# Patient Record
Sex: Male | Born: 1940 | Race: White | Hispanic: No | Smoking: Former smoker
Health system: Southern US, Community
[De-identification: ages and names within clinical notes are randomized; demographics above are authoritative.]

## PROBLEM LIST (undated history)

## (undated) DIAGNOSIS — E079 Disorder of thyroid, unspecified: Secondary | ICD-10-CM

## (undated) DIAGNOSIS — G4700 Insomnia, unspecified: Secondary | ICD-10-CM

## (undated) DIAGNOSIS — Z9289 Personal history of other medical treatment: Secondary | ICD-10-CM

## (undated) DIAGNOSIS — E785 Hyperlipidemia, unspecified: Secondary | ICD-10-CM

## (undated) DIAGNOSIS — I10 Essential (primary) hypertension: Secondary | ICD-10-CM

## (undated) DIAGNOSIS — H9190 Unspecified hearing loss, unspecified ear: Secondary | ICD-10-CM

## (undated) DIAGNOSIS — E039 Hypothyroidism, unspecified: Secondary | ICD-10-CM

## (undated) DIAGNOSIS — Z9889 Other specified postprocedural states: Secondary | ICD-10-CM

## (undated) DIAGNOSIS — I3139 Other pericardial effusion (noninflammatory): Secondary | ICD-10-CM

## (undated) DIAGNOSIS — J302 Other seasonal allergic rhinitis: Secondary | ICD-10-CM

## (undated) DIAGNOSIS — K219 Gastro-esophageal reflux disease without esophagitis: Secondary | ICD-10-CM

## (undated) DIAGNOSIS — J45909 Unspecified asthma, uncomplicated: Secondary | ICD-10-CM

## (undated) DIAGNOSIS — M1990 Unspecified osteoarthritis, unspecified site: Secondary | ICD-10-CM

## (undated) DIAGNOSIS — C61 Malignant neoplasm of prostate: Secondary | ICD-10-CM

## (undated) DIAGNOSIS — J449 Chronic obstructive pulmonary disease, unspecified: Secondary | ICD-10-CM

## (undated) DIAGNOSIS — I313 Pericardial effusion (noninflammatory): Secondary | ICD-10-CM

## (undated) DIAGNOSIS — Z77098 Contact with and (suspected) exposure to other hazardous, chiefly nonmedicinal, chemicals: Secondary | ICD-10-CM

## (undated) DIAGNOSIS — C3490 Malignant neoplasm of unspecified part of unspecified bronchus or lung: Secondary | ICD-10-CM

## (undated) DIAGNOSIS — F4310 Post-traumatic stress disorder, unspecified: Secondary | ICD-10-CM

## (undated) HISTORY — DX: Malignant neoplasm of prostate: C61

## (undated) HISTORY — PX: BACK SURGERY: SHX140

## (undated) HISTORY — PX: NECK SURGERY: SHX720

## (undated) HISTORY — PX: COLONOSCOPY: SHX174

## (undated) HISTORY — PX: TONSILLECTOMY: SUR1361

## (undated) HISTORY — DX: Chronic obstructive pulmonary disease, unspecified: J44.9

## (undated) HISTORY — PX: INCISE AND DRAIN ABCESS: PRO64

## (undated) HISTORY — PX: WISDOM TOOTH EXTRACTION: SHX21

## (undated) HISTORY — PX: HERNIA REPAIR: SHX51

---

## 1999-06-10 ENCOUNTER — Encounter: Payer: Self-pay | Admitting: Emergency Medicine

## 1999-06-10 ENCOUNTER — Emergency Department (HOSPITAL_COMMUNITY): Admission: EM | Admit: 1999-06-10 | Discharge: 1999-06-10 | Payer: Self-pay | Admitting: Emergency Medicine

## 2005-06-01 ENCOUNTER — Encounter: Admission: RE | Admit: 2005-06-01 | Discharge: 2005-06-01 | Payer: Self-pay | Admitting: Neurosurgery

## 2006-03-30 ENCOUNTER — Inpatient Hospital Stay (HOSPITAL_COMMUNITY): Admission: RE | Admit: 2006-03-30 | Discharge: 2006-04-03 | Payer: Self-pay | Admitting: Neurosurgery

## 2009-04-09 ENCOUNTER — Encounter: Admission: RE | Admit: 2009-04-09 | Discharge: 2009-04-09 | Payer: Self-pay | Admitting: Neurosurgery

## 2009-05-28 ENCOUNTER — Inpatient Hospital Stay (HOSPITAL_COMMUNITY): Admission: RE | Admit: 2009-05-28 | Discharge: 2009-05-31 | Payer: Self-pay | Admitting: Neurosurgery

## 2010-07-09 LAB — COMPREHENSIVE METABOLIC PANEL
ALT: 38 U/L (ref 0–53)
AST: 28 U/L (ref 0–37)
Albumin: 4.4 g/dL (ref 3.5–5.2)
Calcium: 10 mg/dL (ref 8.4–10.5)
Creatinine, Ser: 1.04 mg/dL (ref 0.4–1.5)
GFR calc Af Amer: >60 mL/min (ref >60)
GFR calc non Af Amer: >60 mL/min (ref >60)
Total Bilirubin: 0.7 mg/dL (ref 0.3–1.2)
Total Protein: 7.1 g/dL (ref 6.0–8.3)

## 2010-07-09 LAB — URINALYSIS, ROUTINE W REFLEX MICROSCOPIC
Glucose, UA: NEGATIVE mg/dL
Nitrite: NEGATIVE
Specific Gravity, Urine: 1.045 — ABNORMAL HIGH (ref 1.005–1.030)
Urobilinogen, UA: 1 mg/dL (ref 0.0–1.0)

## 2010-07-09 LAB — CBC
HCT: 48.3 % (ref 39.0–52.0)
MCV: 98 fL (ref 78.0–100.0)
WBC: 6.3 10*3/uL (ref 4.0–10.5)

## 2010-07-09 LAB — DIFFERENTIAL
Basophils Absolute: 0.1 10*3/uL (ref 0.0–0.1)
Eosinophils Relative: 4 % (ref 0–5)
Lymphocytes Relative: 30 % (ref 12–46)
Lymphs Abs: 1.9 10*3/uL (ref 0.7–4.0)
Neutrophils Relative %: 57 % (ref 43–77)

## 2010-07-09 LAB — TYPE AND SCREEN: ABO/RH(D): A POS

## 2010-08-11 ENCOUNTER — Encounter (HOSPITAL_COMMUNITY)
Admission: RE | Admit: 2010-08-11 | Discharge: 2010-08-11 | Disposition: A | Discharge status: Home / Self Care | Payer: Medicare Other | Source: Ambulatory Visit | Attending: General Surgery | Admitting: General Surgery

## 2010-08-11 ENCOUNTER — Ambulatory Visit (HOSPITAL_COMMUNITY)
Admission: RE | Admit: 2010-08-11 | Discharge: 2010-08-11 | Disposition: A | Discharge status: Home / Self Care | Payer: Medicare Other | Source: Ambulatory Visit | Attending: General Surgery | Admitting: General Surgery

## 2010-08-11 ENCOUNTER — Other Ambulatory Visit (HOSPITAL_COMMUNITY): Payer: Self-pay | Admitting: General Surgery

## 2010-08-11 DIAGNOSIS — Z01818 Encounter for other preprocedural examination: Secondary | ICD-10-CM | POA: Insufficient documentation

## 2010-08-11 DIAGNOSIS — K429 Umbilical hernia without obstruction or gangrene: Secondary | ICD-10-CM

## 2010-08-11 DIAGNOSIS — Z01810 Encounter for preprocedural cardiovascular examination: Secondary | ICD-10-CM | POA: Insufficient documentation

## 2010-08-11 DIAGNOSIS — Z01812 Encounter for preprocedural laboratory examination: Secondary | ICD-10-CM | POA: Insufficient documentation

## 2010-08-11 DIAGNOSIS — Z01811 Encounter for preprocedural respiratory examination: Secondary | ICD-10-CM | POA: Insufficient documentation

## 2010-08-11 LAB — SURGICAL PCR SCREEN
MRSA, PCR: NEGATIVE
Staphylococcus aureus: NEGATIVE

## 2010-08-11 LAB — DIFFERENTIAL
Basophils Relative: 1 % (ref 0–1)
Lymphs Abs: 2 10*3/uL (ref 0.7–4.0)
Neutro Abs: 3.8 10*3/uL (ref 1.7–7.7)

## 2010-08-11 LAB — BASIC METABOLIC PANEL
Chloride: 105 mEq/L (ref 96–112)
Creatinine, Ser: 1.13 mg/dL (ref 0.4–1.5)
Sodium: 136 mEq/L (ref 135–145)

## 2010-08-11 LAB — CBC
HCT: 46.2 % (ref 39.0–52.0)
MCH: 32.9 pg (ref 26.0–34.0)
MCHC: 34.8 g/dL (ref 30.0–36.0)
RBC: 4.89 MIL/uL (ref 4.22–5.81)
RDW: 12.8 % (ref 11.5–15.5)
WBC: 6.6 10*3/uL (ref 4.0–10.5)

## 2010-08-12 ENCOUNTER — Ambulatory Visit (HOSPITAL_COMMUNITY)
Admission: RE | Admit: 2010-08-12 | Discharge: 2010-08-12 | Disposition: A | Discharge status: Home / Self Care | Payer: Medicare Other | Source: Ambulatory Visit | Attending: General Surgery | Admitting: General Surgery

## 2010-08-12 DIAGNOSIS — J45909 Unspecified asthma, uncomplicated: Secondary | ICD-10-CM | POA: Insufficient documentation

## 2010-08-12 DIAGNOSIS — K219 Gastro-esophageal reflux disease without esophagitis: Secondary | ICD-10-CM | POA: Insufficient documentation

## 2010-08-12 DIAGNOSIS — Z01818 Encounter for other preprocedural examination: Secondary | ICD-10-CM | POA: Insufficient documentation

## 2010-08-12 DIAGNOSIS — Z01812 Encounter for preprocedural laboratory examination: Secondary | ICD-10-CM | POA: Insufficient documentation

## 2010-08-12 DIAGNOSIS — K429 Umbilical hernia without obstruction or gangrene: Secondary | ICD-10-CM | POA: Insufficient documentation

## 2010-08-12 DIAGNOSIS — I10 Essential (primary) hypertension: Secondary | ICD-10-CM | POA: Insufficient documentation

## 2010-08-12 DIAGNOSIS — Z01810 Encounter for preprocedural cardiovascular examination: Secondary | ICD-10-CM | POA: Insufficient documentation

## 2010-08-17 NOTE — Op Note (Signed)
  [name], Carlos Barrera               ACCOUNT NO.:  [id number]  MEDICAL RECORD NO.:  [id number]           PATIENT TYPE:  O  LOCATION:  SDSC                         FACILITY:  [facility]  PHYSICIAN:  Ollen Gross. Vernell Morgans, M.D. DATE OF BIRTH:  Nov 05, 1940  DATE OF PROCEDURE:  08/12/2010 DATE OF DISCHARGE:  08/12/2010                              OPERATIVE REPORT   PREOPERATIVE DIAGNOSIS:  Umbilical hernia.  POSTOPERATIVE DIAGNOSIS:  Umbilical hernia.  PROCEDURE:  Umbilical hernia repair.  SURGEON:  Ollen Gross. Vernell Morgans, MD  ANESTHESIA:  General endotracheal.  PROCEDURE:  After informed consent was obtained, the patient was brought to the operating room and placed in supine position on the operating table.  After induction of general anesthesia, the patient's head was prepped with ChloraPrep, allowed to dry and draped in usual sterile manner.  The area above the umbilicus was infiltrated with 0.25% Marcaine.  The small vertically oriented incision was made just at the superior edge of the umbilicus with a #15 blade knife.  This incision was carried down through the skin and subcutaneous tissue sharply with electrocautery.  There was obvious small fat filled hernia there.  We dissected the hernia free from the rest of subcutaneous tissue by some blunt finger dissection and some sharp dissection with electrocautery. The hernia sac was opened.  There was only some preperitoneal fat within the sac which was reduced.  The fascial edges were clean and healthy. The fascial defect was too small to allow really a index finger through. We therefore repaired the fascial defect.  Initially we probed the anterior abdominal wall with a hemostat from the inside and did not feel any other fascial defects.  We closed the fascial defect with 3 interrupted #1 Novafil stitches.  The subcutaneous tissue was then closed with interrupted 3-0 Vicryl stitches and the skin was closed with interrupted 4-0 Monocryl  subcuticular stitches.  Dermabond dressing was applied.  We decided not to repair the hernia with mesh because the only way to insert the mesh beneath the fascia would be to open the fascial defect quite a bit more than present which I think would potentially weakened the abdominal wall there.  The patient tolerated the procedure well.  At the end of the case, all needle, sponge, and instrument counts were correct.  The patient was awakened, taken to recovery in stable condition.     Ollen Gross. Vernell Morgans, M.D.     PST/MEDQ  D:  08/12/2010  T:  08/13/2010  Job:  [id number]  Electronically Signed by Chevis Pretty III M.D. on 08/17/2010 06:41:13 AM

## 2010-09-05 NOTE — Discharge Summary (Signed)
NAMEMCLANE, ARORA               ACCOUNT NO.:  [id number]   MEDICAL RECORD NO.:  [id number]          PATIENT TYPE:  INP   LOCATION:  3037                         FACILITY:  [facility]   PHYSICIAN:  Hilda Lias, M.D.   DATE OF BIRTH:  04-21-1940   DATE OF ADMISSION:  03/30/2006  DATE OF DISCHARGE:  04/03/2006                               DISCHARGE SUMMARY   ADMISSION DIAGNOSIS:  Cervical spondylosis 4-5, 6-7.   FINAL DIAGNOSIS:  Cervical spondylosis 4-5, 6-7.   CLINICAL HISTORY:  The patient was admitted because of neck pain,  associated with discomfort going to the upper extremity.  X-ray showed  the spondylosis to 4-5, 5-6, 6-7.  Surgery was advised by Dr. Channing Mutters.   LABORATORY DATA:  Normal.   COURSE IN THE HOSPITAL:  The patient was taken to surgery and __________  was done.  The patient had difficulty swallowing the first few days, but  now he is stable.  He is ready to go home.   CONDITION ON DISCHARGE:  Improving.   MEDICATIONS:  Percocet, diazepam.   DIET:  Regular.   ACTIVITY:  Not to drive for at least a week.  Not to lift more than 20  pounds with the shoulder.   FOLLOWUP:  He will be calling Dr. Channing Mutters for an appointment.           ______________________________  Hilda Lias, M.D.     EB/MEDQ  D:  04/03/2006  T:  04/04/2006  Job:  [id number]

## 2010-09-05 NOTE — Op Note (Signed)
[name], Carlos Barrera               ACCOUNT NO.:  [id number]   MEDICAL RECORD NO.:  [id number]          PATIENT TYPE:  INP   LOCATION:  2899                         FACILITY:  [facility]   PHYSICIAN:  Payton Doughty, M.D.      DATE OF BIRTH:  22-Nov-1940   DATE OF PROCEDURE:  03/30/2006  DATE OF DISCHARGE:                               OPERATIVE REPORT   PREOPERATIVE DIAGNOSIS:  Spondylosis C4-5, C5-6, C6-C7.   POSTOPERATIVE DIAGNOSIS:  Spondylosis C4-5, C5-6, C6-C7.   PROCEDURE:  C4-5, C5-6, C6-7 anterior cervical decompression and fusion  with reflex hybrid plate.   SURGEON:  Payton Doughty, M.D.   ANESTHESIA:  General endotracheal anesthesia.   PREPARATION:  Betadine prep and scrub with alcohol wipe.   COMPLICATIONS:  None.   NURSE ASSISTANT:  [place].   DOCTOR ASSISTANCE:  Clydene Fake, M.D.   A 70 year old gentleman with severe cervical spondylosis, taken to the  operating room and smoothly anesthetized and intubated, placed supine on  the operating table in the [place] head holder with the neck slightly  extended.  Following shave, prep and drape in the usual sterile fashion,  the skin was incised starting one fingerbreadth above the clavicle  paralleling the medial aspect the sternocleidomastoid muscle for a  distance of approximately 10 cm.  The platysma was identified, elevated,  divide and undermined.  Sternocleidomastoid was identified.  Medial  dissection revealed the carotid artery retracted laterally to the left,  trachea and esophagus retracted laterally to the right.  This exposed  the bones of the anterior cervical spine.  Marker was placed.  Intraoperative x-ray obtained to confirm correctness level.  Having  confirmed correctness level, diskectomy was carried out at C4-5, C5-6  and C6-7 under gross observation.  The operating microscope was then  brought in and we used microdissection technique to remove bone spur,  divide the posterior longitudinal ligament  and explore the neural  foramina bilaterally.  At C6-7 the high-speed drill was used to remove a  large posteriorly protruding osteophyte.  Following complete  decompression at all levels, 8 mm bone grafts were fashioned with  patellar allograft and tapped into place and a three-level Reflex hybrid  plate was placed and contoured.  It was screwed in with 12 mm screws,  two in C4, two in  C5, two in C6 and two in C7.  Intraoperative x-ray  showed good placement of bone grafts, plate and screws.  Wound was  irrigated and hemostasis assured.  A 7 mL Jackson-Pratt drain was placed  in the prevertebral space and exited  via separate incision.  Successive layers of 3-0 Vicryl and 4-0 Vicryl  were used to close.  Benzoin and Steri-Strips were placed, made  occlusive with Telfa and OpSite and the patient placed in an Aspen  collar and returned to the recovery room in good condition.           ______________________________  Payton Doughty, M.D.     MWR/MEDQ  D:  03/30/2006  T:  03/30/2006  Job:  [id number]

## 2010-09-05 NOTE — H&P (Signed)
NAMEJEMAL, Carlos Barrera               ACCOUNT NO.:  [id number]   MEDICAL RECORD NO.:  [id number]          PATIENT TYPE:  INP   LOCATION:  3037                         FACILITY:  [facility]   PHYSICIAN:  Payton Doughty, M.D.      DATE OF BIRTH:  05/25/1940   DATE OF ADMISSION:  03/30/2006  DATE OF DISCHARGE:                              HISTORY & PHYSICAL   ADMITTING DIAGNOSIS:  Spondylosis at C4-5, C5-6 and C6-7.   BODY AND TEXT:  This is a self-referred 70 year old, right-handed, white  gentleman who has had neck pain for a number of years.  He is not  hyperreflexic, he has been on traction for a while, and MR demonstrates  spondylosis with foraminal narrowing at C4-5, C5-6 and C6-7, and he is  admitted now for an anterior decompression and fusion to these levels.   MEDICAL HISTORY:  Fairly benign.  He has hypertension and he takes  enalapril 20 mg a day.   ALLERGIES:  HE IS ALLERGIC TO FELDENE.   SURGICAL HISTORY:  1. Lumbar disc in 1995.  2. Tonsillectomy.  3. Right-sided neck cyst numerous years ago.   SOCIAL HISTORY:  He does not smoke, does not drink, and works as truck  and Midwife.   FAMILY HISTORY:  Noncontributory for this problem.   PHYSICAL EXAMINATION:  HEENT EXAM:  Within normal limits.  NECK:  He has reasonable range of motion in his neck with no  Lhermitte's.  CHEST:  Clear.  CARDIAC EXAM:  Regular rate and rhythm.  ABDOMEN:  Nontender.  No hepatosplenomegaly.  EXTREMITIES:  Without clubbing or cyanosis.  Peripheral pulses are good.  GU EXAM:  Deferred.  NEUROLOGICALLY:  He is awake, alert and oriented.  His cranial nerves  are intact.  Motor exam shows 5/5 strength throughout the upper  extremities, except for right deltoid and the right grip which are 4/5.  There is no Hoffman's.  Sensory disease is described in the right C6 and  C7 distribution.   CLINICAL IMPRESSION:  Cervical spondylosis with mild myelopathy.   PLAN:  Anterior decompression and fusion at  C4-5, C5-6 and C6-7.  The  risks and benefits of this regimen discussed and he wishes to proceed.           ______________________________  Payton Doughty, M.D.     MWR/MEDQ  D:  03/30/2006  T:  03/31/2006  Job:  [id number]

## 2011-07-24 DIAGNOSIS — M47812 Spondylosis without myelopathy or radiculopathy, cervical region: Secondary | ICD-10-CM | POA: Diagnosis not present

## 2011-07-24 DIAGNOSIS — M47817 Spondylosis without myelopathy or radiculopathy, lumbosacral region: Secondary | ICD-10-CM | POA: Diagnosis not present

## 2011-11-11 DIAGNOSIS — M47812 Spondylosis without myelopathy or radiculopathy, cervical region: Secondary | ICD-10-CM | POA: Diagnosis not present

## 2011-11-11 DIAGNOSIS — M47817 Spondylosis without myelopathy or radiculopathy, lumbosacral region: Secondary | ICD-10-CM | POA: Diagnosis not present

## 2012-02-19 DIAGNOSIS — Z23 Encounter for immunization: Secondary | ICD-10-CM | POA: Diagnosis not present

## 2012-03-07 DIAGNOSIS — M47812 Spondylosis without myelopathy or radiculopathy, cervical region: Secondary | ICD-10-CM | POA: Diagnosis not present

## 2012-03-07 DIAGNOSIS — M47817 Spondylosis without myelopathy or radiculopathy, lumbosacral region: Secondary | ICD-10-CM | POA: Diagnosis not present

## 2012-06-08 DIAGNOSIS — M47817 Spondylosis without myelopathy or radiculopathy, lumbosacral region: Secondary | ICD-10-CM | POA: Diagnosis not present

## 2012-06-08 DIAGNOSIS — M47812 Spondylosis without myelopathy or radiculopathy, cervical region: Secondary | ICD-10-CM | POA: Diagnosis not present

## 2012-09-07 DIAGNOSIS — M47812 Spondylosis without myelopathy or radiculopathy, cervical region: Secondary | ICD-10-CM | POA: Diagnosis not present

## 2012-09-07 DIAGNOSIS — M47817 Spondylosis without myelopathy or radiculopathy, lumbosacral region: Secondary | ICD-10-CM | POA: Diagnosis not present

## 2012-12-15 DIAGNOSIS — M47817 Spondylosis without myelopathy or radiculopathy, lumbosacral region: Secondary | ICD-10-CM | POA: Diagnosis not present

## 2012-12-15 DIAGNOSIS — M47812 Spondylosis without myelopathy or radiculopathy, cervical region: Secondary | ICD-10-CM | POA: Diagnosis not present

## 2013-03-18 DIAGNOSIS — Z23 Encounter for immunization: Secondary | ICD-10-CM | POA: Diagnosis not present

## 2013-03-28 DIAGNOSIS — M47812 Spondylosis without myelopathy or radiculopathy, cervical region: Secondary | ICD-10-CM | POA: Diagnosis not present

## 2013-03-28 DIAGNOSIS — M47817 Spondylosis without myelopathy or radiculopathy, lumbosacral region: Secondary | ICD-10-CM | POA: Diagnosis not present

## 2013-06-10 DIAGNOSIS — H1045 Other chronic allergic conjunctivitis: Secondary | ICD-10-CM | POA: Diagnosis not present

## 2013-06-10 DIAGNOSIS — H40039 Anatomical narrow angle, unspecified eye: Secondary | ICD-10-CM | POA: Diagnosis not present

## 2013-09-20 DIAGNOSIS — M47812 Spondylosis without myelopathy or radiculopathy, cervical region: Secondary | ICD-10-CM | POA: Diagnosis not present

## 2013-09-20 DIAGNOSIS — M47817 Spondylosis without myelopathy or radiculopathy, lumbosacral region: Secondary | ICD-10-CM | POA: Diagnosis not present

## 2014-03-21 DIAGNOSIS — M47816 Spondylosis without myelopathy or radiculopathy, lumbar region: Secondary | ICD-10-CM | POA: Diagnosis not present

## 2014-03-21 DIAGNOSIS — M4302 Spondylolysis, cervical region: Secondary | ICD-10-CM | POA: Diagnosis not present

## 2014-04-10 DIAGNOSIS — M779 Enthesopathy, unspecified: Secondary | ICD-10-CM | POA: Diagnosis not present

## 2014-04-10 DIAGNOSIS — M7021 Olecranon bursitis, right elbow: Secondary | ICD-10-CM | POA: Diagnosis not present

## 2014-04-10 DIAGNOSIS — M19021 Primary osteoarthritis, right elbow: Secondary | ICD-10-CM | POA: Diagnosis not present

## 2014-04-10 DIAGNOSIS — M25521 Pain in right elbow: Secondary | ICD-10-CM | POA: Diagnosis not present

## 2014-05-07 ENCOUNTER — Emergency Department (HOSPITAL_COMMUNITY): Payer: Medicare Other

## 2014-05-07 ENCOUNTER — Emergency Department (HOSPITAL_COMMUNITY)
Admission: EM | Admit: 2014-05-07 | Discharge: 2014-05-07 | Disposition: A | Discharge status: Home / Self Care | Payer: Medicare Other | Attending: Emergency Medicine | Admitting: Emergency Medicine

## 2014-05-07 ENCOUNTER — Encounter (HOSPITAL_COMMUNITY): Payer: Self-pay

## 2014-05-07 DIAGNOSIS — S4991XA Unspecified injury of right shoulder and upper arm, initial encounter: Secondary | ICD-10-CM | POA: Insufficient documentation

## 2014-05-07 DIAGNOSIS — Y9389 Activity, other specified: Secondary | ICD-10-CM | POA: Diagnosis not present

## 2014-05-07 DIAGNOSIS — S0990XA Unspecified injury of head, initial encounter: Secondary | ICD-10-CM | POA: Insufficient documentation

## 2014-05-07 DIAGNOSIS — I10 Essential (primary) hypertension: Secondary | ICD-10-CM | POA: Insufficient documentation

## 2014-05-07 DIAGNOSIS — S3992XA Unspecified injury of lower back, initial encounter: Secondary | ICD-10-CM | POA: Insufficient documentation

## 2014-05-07 DIAGNOSIS — Y998 Other external cause status: Secondary | ICD-10-CM | POA: Insufficient documentation

## 2014-05-07 DIAGNOSIS — S59901A Unspecified injury of right elbow, initial encounter: Secondary | ICD-10-CM | POA: Diagnosis not present

## 2014-05-07 DIAGNOSIS — Z87891 Personal history of nicotine dependence: Secondary | ICD-10-CM | POA: Insufficient documentation

## 2014-05-07 DIAGNOSIS — Z8639 Personal history of other endocrine, nutritional and metabolic disease: Secondary | ICD-10-CM | POA: Insufficient documentation

## 2014-05-07 DIAGNOSIS — S12600A Unspecified displaced fracture of seventh cervical vertebra, initial encounter for closed fracture: Secondary | ICD-10-CM | POA: Insufficient documentation

## 2014-05-07 DIAGNOSIS — S129XXA Fracture of neck, unspecified, initial encounter: Secondary | ICD-10-CM | POA: Diagnosis not present

## 2014-05-07 DIAGNOSIS — M542 Cervicalgia: Secondary | ICD-10-CM | POA: Diagnosis not present

## 2014-05-07 DIAGNOSIS — Y92410 Unspecified street and highway as the place of occurrence of the external cause: Secondary | ICD-10-CM | POA: Diagnosis not present

## 2014-05-07 DIAGNOSIS — S12690A Other displaced fracture of seventh cervical vertebra, initial encounter for closed fracture: Secondary | ICD-10-CM | POA: Diagnosis not present

## 2014-05-07 DIAGNOSIS — R51 Headache: Secondary | ICD-10-CM | POA: Diagnosis not present

## 2014-05-07 DIAGNOSIS — R55 Syncope and collapse: Secondary | ICD-10-CM | POA: Diagnosis not present

## 2014-05-07 DIAGNOSIS — S59801A Other specified injuries of right elbow, initial encounter: Secondary | ICD-10-CM | POA: Diagnosis not present

## 2014-05-07 DIAGNOSIS — M25511 Pain in right shoulder: Secondary | ICD-10-CM | POA: Diagnosis not present

## 2014-05-07 DIAGNOSIS — S299XXA Unspecified injury of thorax, initial encounter: Secondary | ICD-10-CM | POA: Diagnosis not present

## 2014-05-07 DIAGNOSIS — M25521 Pain in right elbow: Secondary | ICD-10-CM | POA: Diagnosis not present

## 2014-05-07 DIAGNOSIS — S199XXA Unspecified injury of neck, initial encounter: Secondary | ICD-10-CM | POA: Diagnosis present

## 2014-05-07 DIAGNOSIS — T148 Other injury of unspecified body region: Secondary | ICD-10-CM | POA: Diagnosis not present

## 2014-05-07 HISTORY — DX: Disorder of thyroid, unspecified: E07.9

## 2014-05-07 HISTORY — DX: Essential (primary) hypertension: I10

## 2014-05-07 MED ORDER — ONDANSETRON HCL 4 MG/2ML IJ SOLN
4.0000 mg | Freq: Once | INTRAMUSCULAR | Status: AC
  Administered 2014-05-07: 4 mg via INTRAVENOUS
  Filled 2014-05-07: qty 2

## 2014-05-07 MED ORDER — MORPHINE SULFATE 4 MG/ML IJ SOLN
4.0000 mg | Freq: Once | INTRAMUSCULAR | Status: AC
  Administered 2014-05-07: 4 mg via INTRAVENOUS
  Filled 2014-05-07: qty 1
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
## 2014-05-07 NOTE — ED Notes (Signed)
Patient transported to CT 
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
## 2014-05-15 DIAGNOSIS — M25521 Pain in right elbow: Secondary | ICD-10-CM | POA: Diagnosis not present

## 2014-05-15 DIAGNOSIS — S5001XA Contusion of right elbow, initial encounter: Secondary | ICD-10-CM | POA: Diagnosis not present

## 2014-05-20 ENCOUNTER — Emergency Department (HOSPITAL_COMMUNITY)
Admission: EM | Admit: 2014-05-20 | Discharge: 2014-05-20 | Disposition: A | Discharge status: Home / Self Care | Payer: Medicare Other | Attending: Emergency Medicine | Admitting: Emergency Medicine

## 2014-05-20 ENCOUNTER — Emergency Department (HOSPITAL_COMMUNITY): Payer: Medicare Other

## 2014-05-20 ENCOUNTER — Encounter (HOSPITAL_COMMUNITY): Payer: Self-pay | Admitting: Emergency Medicine

## 2014-05-20 DIAGNOSIS — Z87891 Personal history of nicotine dependence: Secondary | ICD-10-CM | POA: Insufficient documentation

## 2014-05-20 DIAGNOSIS — E079 Disorder of thyroid, unspecified: Secondary | ICD-10-CM | POA: Diagnosis not present

## 2014-05-20 DIAGNOSIS — S128XXD Fracture of other parts of neck, subsequent encounter: Secondary | ICD-10-CM | POA: Diagnosis not present

## 2014-05-20 DIAGNOSIS — Z7951 Long term (current) use of inhaled steroids: Secondary | ICD-10-CM | POA: Insufficient documentation

## 2014-05-20 DIAGNOSIS — M542 Cervicalgia: Secondary | ICD-10-CM | POA: Diagnosis present

## 2014-05-20 DIAGNOSIS — Z7982 Long term (current) use of aspirin: Secondary | ICD-10-CM | POA: Insufficient documentation

## 2014-05-20 DIAGNOSIS — S129XXD Fracture of neck, unspecified, subsequent encounter: Secondary | ICD-10-CM | POA: Diagnosis not present

## 2014-05-20 DIAGNOSIS — Z79899 Other long term (current) drug therapy: Secondary | ICD-10-CM | POA: Insufficient documentation

## 2014-05-20 DIAGNOSIS — S12691D Other nondisplaced fracture of seventh cervical vertebra, subsequent encounter for fracture with routine healing: Secondary | ICD-10-CM | POA: Insufficient documentation

## 2014-05-20 DIAGNOSIS — I10 Essential (primary) hypertension: Secondary | ICD-10-CM | POA: Diagnosis not present

## 2014-05-20 MED ORDER — OXYCODONE-ACETAMINOPHEN 5-325 MG PO TABS: 1.0000 | ORAL_TABLET | Freq: Four times a day (QID) | ORAL | Status: DC | PRN

## 2014-05-20 MED ORDER — MORPHINE SULFATE 4 MG/ML IJ SOLN
6.0000 mg | Freq: Once | INTRAMUSCULAR | Status: AC
  Administered 2014-05-20: 6 mg via INTRAVENOUS
  Filled 2014-05-20: qty 2

## 2014-05-20 NOTE — ED Notes (Signed)
Pt returned from scanner.

## 2014-05-20 NOTE — Discharge Instructions (Signed)
Return here as needed. Follow up with your neurosurgeon as scheduled. The CT scan today was unchanged from prior

## 2014-05-20 NOTE — ED Notes (Signed)
Mouth swab given. MD at beside updating pt on POC.

## 2014-05-20 NOTE — ED Notes (Signed)
PT ambulated with baseline gait; VSS; A&Ox3; no signs of distress; respirations even and unlabored; skin warm and dry; no questions upon discharge.  

## 2014-05-20 NOTE — ED Notes (Signed)
Pt states he woke up today at 2 am with a sharp pain on his right side of his neck, pt is wearing a c-collar at this time, he tock some Hydrocodone 5 mg and Flexeril 10 mg with no relief.

## 2014-05-20 NOTE — ED Notes (Signed)
Patient transported to CT 
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
## 2014-05-24 DIAGNOSIS — Z981 Arthrodesis status: Secondary | ICD-10-CM | POA: Diagnosis not present

## 2014-05-24 DIAGNOSIS — M4322 Fusion of spine, cervical region: Secondary | ICD-10-CM | POA: Diagnosis not present

## 2014-05-24 DIAGNOSIS — M4302 Spondylolysis, cervical region: Secondary | ICD-10-CM | POA: Diagnosis not present

## 2014-05-24 DIAGNOSIS — M47816 Spondylosis without myelopathy or radiculopathy, lumbar region: Secondary | ICD-10-CM | POA: Diagnosis not present

## 2014-05-24 DIAGNOSIS — Z09 Encounter for follow-up examination after completed treatment for conditions other than malignant neoplasm: Secondary | ICD-10-CM | POA: Diagnosis not present

## 2014-05-24 DIAGNOSIS — M4852XA Collapsed vertebra, not elsewhere classified, cervical region, initial encounter for fracture: Secondary | ICD-10-CM | POA: Diagnosis not present

## 2014-06-08 ENCOUNTER — Encounter (HOSPITAL_COMMUNITY): Payer: Self-pay | Admitting: *Deleted

## 2014-06-08 ENCOUNTER — Emergency Department (HOSPITAL_COMMUNITY): Payer: No Typology Code available for payment source

## 2014-06-08 ENCOUNTER — Emergency Department (HOSPITAL_COMMUNITY)
Admission: EM | Admit: 2014-06-08 | Discharge: 2014-06-08 | Disposition: A | Discharge status: Home / Self Care | Payer: No Typology Code available for payment source | Attending: Emergency Medicine | Admitting: Emergency Medicine

## 2014-06-08 DIAGNOSIS — I10 Essential (primary) hypertension: Secondary | ICD-10-CM | POA: Diagnosis not present

## 2014-06-08 DIAGNOSIS — Z7982 Long term (current) use of aspirin: Secondary | ICD-10-CM | POA: Diagnosis not present

## 2014-06-08 DIAGNOSIS — Z87891 Personal history of nicotine dependence: Secondary | ICD-10-CM | POA: Diagnosis not present

## 2014-06-08 DIAGNOSIS — Z79899 Other long term (current) drug therapy: Secondary | ICD-10-CM | POA: Diagnosis not present

## 2014-06-08 DIAGNOSIS — M542 Cervicalgia: Secondary | ICD-10-CM | POA: Insufficient documentation

## 2014-06-08 DIAGNOSIS — R202 Paresthesia of skin: Secondary | ICD-10-CM | POA: Diagnosis not present

## 2014-06-08 DIAGNOSIS — E079 Disorder of thyroid, unspecified: Secondary | ICD-10-CM | POA: Diagnosis not present

## 2014-06-08 DIAGNOSIS — I6789 Other cerebrovascular disease: Secondary | ICD-10-CM | POA: Diagnosis not present

## 2014-06-08 DIAGNOSIS — S12690A Other displaced fracture of seventh cervical vertebra, initial encounter for closed fracture: Secondary | ICD-10-CM | POA: Diagnosis not present

## 2014-06-08 MED ORDER — IBUPROFEN 800 MG PO TABS: 800.0000 mg | ORAL_TABLET | Freq: Three times a day (TID) | ORAL | Status: DC | PRN

## 2014-06-08 MED ORDER — DIAZEPAM 2 MG PO TABS
2.0000 mg | ORAL_TABLET | Freq: Once | ORAL | Status: AC
  Administered 2014-06-08: 2 mg via ORAL
  Filled 2014-06-08: qty 1

## 2014-06-08 MED ORDER — KETOROLAC TROMETHAMINE 60 MG/2ML IM SOLN
60.0000 mg | Freq: Once | INTRAMUSCULAR | Status: AC
  Administered 2014-06-08: 60 mg via INTRAMUSCULAR
  Filled 2014-06-08: qty 2
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
## 2014-06-14 DIAGNOSIS — Z981 Arthrodesis status: Secondary | ICD-10-CM | POA: Diagnosis not present

## 2014-06-14 DIAGNOSIS — S12690D Other displaced fracture of seventh cervical vertebra, subsequent encounter for fracture with routine healing: Secondary | ICD-10-CM | POA: Diagnosis not present

## 2014-06-14 DIAGNOSIS — M47816 Spondylosis without myelopathy or radiculopathy, lumbar region: Secondary | ICD-10-CM | POA: Diagnosis not present

## 2014-06-14 DIAGNOSIS — M47892 Other spondylosis, cervical region: Secondary | ICD-10-CM | POA: Diagnosis not present

## 2014-06-14 DIAGNOSIS — M4302 Spondylolysis, cervical region: Secondary | ICD-10-CM | POA: Diagnosis not present

## 2014-06-20 DIAGNOSIS — S5001XD Contusion of right elbow, subsequent encounter: Secondary | ICD-10-CM | POA: Diagnosis not present

## 2014-06-20 DIAGNOSIS — M75102 Unspecified rotator cuff tear or rupture of left shoulder, not specified as traumatic: Secondary | ICD-10-CM | POA: Diagnosis not present

## 2014-06-20 DIAGNOSIS — M7542 Impingement syndrome of left shoulder: Secondary | ICD-10-CM | POA: Diagnosis not present

## 2014-06-20 DIAGNOSIS — M25521 Pain in right elbow: Secondary | ICD-10-CM | POA: Diagnosis not present

## 2014-06-26 DIAGNOSIS — M7542 Impingement syndrome of left shoulder: Secondary | ICD-10-CM | POA: Diagnosis not present

## 2014-06-29 DIAGNOSIS — M7542 Impingement syndrome of left shoulder: Secondary | ICD-10-CM | POA: Diagnosis not present

## 2014-07-03 DIAGNOSIS — M7542 Impingement syndrome of left shoulder: Secondary | ICD-10-CM | POA: Diagnosis not present

## 2014-07-06 DIAGNOSIS — M7542 Impingement syndrome of left shoulder: Secondary | ICD-10-CM | POA: Diagnosis not present

## 2014-07-10 DIAGNOSIS — M7542 Impingement syndrome of left shoulder: Secondary | ICD-10-CM | POA: Diagnosis not present

## 2014-07-12 DIAGNOSIS — M7542 Impingement syndrome of left shoulder: Secondary | ICD-10-CM | POA: Diagnosis not present

## 2014-07-17 DIAGNOSIS — M7542 Impingement syndrome of left shoulder: Secondary | ICD-10-CM | POA: Diagnosis not present

## 2014-07-18 DIAGNOSIS — M4302 Spondylolysis, cervical region: Secondary | ICD-10-CM | POA: Diagnosis not present

## 2014-07-18 DIAGNOSIS — M47816 Spondylosis without myelopathy or radiculopathy, lumbar region: Secondary | ICD-10-CM | POA: Diagnosis not present

## 2014-07-19 DIAGNOSIS — M7542 Impingement syndrome of left shoulder: Secondary | ICD-10-CM | POA: Diagnosis not present

## 2014-07-24 DIAGNOSIS — M7021 Olecranon bursitis, right elbow: Secondary | ICD-10-CM | POA: Diagnosis not present

## 2014-07-24 DIAGNOSIS — M25512 Pain in left shoulder: Secondary | ICD-10-CM | POA: Diagnosis not present

## 2014-07-24 DIAGNOSIS — M75102 Unspecified rotator cuff tear or rupture of left shoulder, not specified as traumatic: Secondary | ICD-10-CM | POA: Diagnosis not present

## 2014-07-24 DIAGNOSIS — S5001XD Contusion of right elbow, subsequent encounter: Secondary | ICD-10-CM | POA: Diagnosis not present
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
## 2016-09-15 DIAGNOSIS — M25562 Pain in left knee: Secondary | ICD-10-CM | POA: Diagnosis not present

## 2016-09-15 DIAGNOSIS — M1712 Unilateral primary osteoarthritis, left knee: Secondary | ICD-10-CM | POA: Diagnosis not present

## 2016-09-16 DIAGNOSIS — M1711 Unilateral primary osteoarthritis, right knee: Secondary | ICD-10-CM | POA: Diagnosis not present

## 2016-09-16 DIAGNOSIS — M25561 Pain in right knee: Secondary | ICD-10-CM | POA: Diagnosis not present

## 2016-09-22 DIAGNOSIS — M25562 Pain in left knee: Secondary | ICD-10-CM | POA: Diagnosis not present

## 2016-09-22 DIAGNOSIS — M1712 Unilateral primary osteoarthritis, left knee: Secondary | ICD-10-CM | POA: Diagnosis not present

## 2016-09-23 DIAGNOSIS — M1711 Unilateral primary osteoarthritis, right knee: Secondary | ICD-10-CM | POA: Diagnosis not present

## 2016-09-23 DIAGNOSIS — M25561 Pain in right knee: Secondary | ICD-10-CM | POA: Diagnosis not present

## 2016-09-29 DIAGNOSIS — M1712 Unilateral primary osteoarthritis, left knee: Secondary | ICD-10-CM | POA: Diagnosis not present

## 2016-09-29 DIAGNOSIS — M25562 Pain in left knee: Secondary | ICD-10-CM | POA: Diagnosis not present

## 2016-09-30 DIAGNOSIS — M1711 Unilateral primary osteoarthritis, right knee: Secondary | ICD-10-CM | POA: Diagnosis not present

## 2016-09-30 DIAGNOSIS — M25561 Pain in right knee: Secondary | ICD-10-CM | POA: Diagnosis not present

## 2016-10-06 DIAGNOSIS — M25562 Pain in left knee: Secondary | ICD-10-CM | POA: Diagnosis not present

## 2016-10-06 DIAGNOSIS — M1712 Unilateral primary osteoarthritis, left knee: Secondary | ICD-10-CM | POA: Diagnosis not present

## 2016-10-07 DIAGNOSIS — M1711 Unilateral primary osteoarthritis, right knee: Secondary | ICD-10-CM | POA: Diagnosis not present

## 2016-10-07 DIAGNOSIS — M25561 Pain in right knee: Secondary | ICD-10-CM | POA: Diagnosis not present

## 2016-10-13 DIAGNOSIS — M25562 Pain in left knee: Secondary | ICD-10-CM | POA: Diagnosis not present

## 2016-10-13 DIAGNOSIS — M1712 Unilateral primary osteoarthritis, left knee: Secondary | ICD-10-CM | POA: Diagnosis not present

## 2016-10-22 ENCOUNTER — Emergency Department (HOSPITAL_COMMUNITY)
Admission: EM | Admit: 2016-10-22 | Discharge: 2016-10-22 | Disposition: A | Discharge status: Home / Self Care | Payer: Medicare Other | Attending: Emergency Medicine | Admitting: Emergency Medicine

## 2016-10-22 ENCOUNTER — Emergency Department (HOSPITAL_COMMUNITY): Payer: Medicare Other

## 2016-10-22 ENCOUNTER — Encounter (HOSPITAL_COMMUNITY): Payer: Self-pay

## 2016-10-22 DIAGNOSIS — I10 Essential (primary) hypertension: Secondary | ICD-10-CM | POA: Insufficient documentation

## 2016-10-22 DIAGNOSIS — X18XXXA Contact with other hot metals, initial encounter: Secondary | ICD-10-CM | POA: Diagnosis not present

## 2016-10-22 DIAGNOSIS — Y929 Unspecified place or not applicable: Secondary | ICD-10-CM | POA: Diagnosis not present

## 2016-10-22 DIAGNOSIS — T300 Burn of unspecified body region, unspecified degree: Secondary | ICD-10-CM

## 2016-10-22 DIAGNOSIS — Z87891 Personal history of nicotine dependence: Secondary | ICD-10-CM | POA: Diagnosis not present

## 2016-10-22 DIAGNOSIS — S0990XA Unspecified injury of head, initial encounter: Secondary | ICD-10-CM | POA: Diagnosis not present

## 2016-10-22 DIAGNOSIS — T310 Burns involving less than 10% of body surface: Secondary | ICD-10-CM | POA: Insufficient documentation

## 2016-10-22 DIAGNOSIS — Z7982 Long term (current) use of aspirin: Secondary | ICD-10-CM | POA: Diagnosis not present

## 2016-10-22 DIAGNOSIS — Y939 Activity, unspecified: Secondary | ICD-10-CM | POA: Diagnosis not present

## 2016-10-22 DIAGNOSIS — Z79899 Other long term (current) drug therapy: Secondary | ICD-10-CM | POA: Insufficient documentation

## 2016-10-22 DIAGNOSIS — S0993XA Unspecified injury of face, initial encounter: Secondary | ICD-10-CM | POA: Diagnosis not present

## 2016-10-22 DIAGNOSIS — T24231A Burn of second degree of right lower leg, initial encounter: Secondary | ICD-10-CM | POA: Insufficient documentation

## 2016-10-22 DIAGNOSIS — S098XXA Other specified injuries of head, initial encounter: Secondary | ICD-10-CM | POA: Insufficient documentation

## 2016-10-22 DIAGNOSIS — R51 Headache: Secondary | ICD-10-CM | POA: Diagnosis not present

## 2016-10-22 DIAGNOSIS — Y999 Unspecified external cause status: Secondary | ICD-10-CM | POA: Insufficient documentation

## 2016-10-22 MED ORDER — NAPROXEN 375 MG PO TABS
375.0000 mg | ORAL_TABLET | Freq: Once | ORAL | Status: AC
  Administered 2016-10-22: 375 mg via ORAL
  Filled 2016-10-22: qty 1

## 2016-10-22 MED ORDER — SILVER SULFADIAZINE 1 % EX CREA
TOPICAL_CREAM | Freq: Once | CUTANEOUS | Status: AC
  Administered 2016-10-22: 17:00:00 via TOPICAL
  Filled 2016-10-22: qty 50
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
## 2016-11-27 ENCOUNTER — Emergency Department
Admission: EM | Admit: 2016-11-27 | Discharge: 2016-11-27 | Disposition: A | Discharge status: Home / Self Care | Payer: Medicare Other | Attending: Emergency Medicine | Admitting: Emergency Medicine

## 2016-11-27 ENCOUNTER — Encounter: Payer: Self-pay | Admitting: Emergency Medicine

## 2016-11-27 DIAGNOSIS — Z7982 Long term (current) use of aspirin: Secondary | ICD-10-CM | POA: Diagnosis not present

## 2016-11-27 DIAGNOSIS — T7840XA Allergy, unspecified, initial encounter: Secondary | ICD-10-CM | POA: Diagnosis present

## 2016-11-27 DIAGNOSIS — T63441A Toxic effect of venom of bees, accidental (unintentional), initial encounter: Secondary | ICD-10-CM | POA: Insufficient documentation

## 2016-11-27 DIAGNOSIS — Z79899 Other long term (current) drug therapy: Secondary | ICD-10-CM | POA: Insufficient documentation

## 2016-11-27 DIAGNOSIS — I10 Essential (primary) hypertension: Secondary | ICD-10-CM | POA: Diagnosis not present

## 2016-11-27 DIAGNOSIS — Z87891 Personal history of nicotine dependence: Secondary | ICD-10-CM | POA: Diagnosis not present

## 2016-11-27 MED ORDER — METHYLPREDNISOLONE SODIUM SUCC 125 MG IJ SOLR
125.0000 mg | Freq: Once | INTRAMUSCULAR | Status: AC
  Administered 2016-11-27: 125 mg via INTRAMUSCULAR
  Filled 2016-11-27: qty 2

## 2016-11-27 MED ORDER — DIPHENHYDRAMINE HCL 50 MG/ML IJ SOLN
50.0000 mg | Freq: Once | INTRAMUSCULAR | Status: AC
  Administered 2016-11-27: 50 mg via INTRAMUSCULAR
  Filled 2016-11-27: qty 1

## 2016-11-27 MED ORDER — FAMOTIDINE 20 MG PO TABS
20.0000 mg | ORAL_TABLET | Freq: Once | ORAL | Status: AC
  Administered 2016-11-27: 20 mg via ORAL
  Filled 2016-11-27: qty 1

## 2016-11-27 MED ORDER — PREDNISONE 50 MG PO TABS: 50.0000 mg | ORAL_TABLET | Freq: Every day | ORAL | 0 refills | Status: DC

## 2016-11-27 NOTE — ED Notes (Signed)
Pt. Going home with friend 

## 2016-11-27 NOTE — ED Notes (Addendum)
Pt. States he was stung by a yellow insect to upper back.  Pt. States taking benadryl before coming to ED.  Swelling/welt noted to back.

## 2016-11-27 NOTE — ED Provider Notes (Signed)
[facility] Emergency Department Provider Note  ____________________________________________  Time seen: Approximately 6:07 PM  I have reviewed the triage vital signs and the nursing notes.   HISTORY  Chief Complaint Allergic Reaction    HPI Carlos Barrera is a 76 y.o. male who presents emergency department status post being stung by apparent yellowjacket. Patient reports that he was stung by a 2 of these, one to the posterior right shoulder, the other to the left lower leg. Patient reports that he is highly allergic to anything that "stings." Patient does have an EpiPen but states that symptoms were not better enough for him to use autoinjector. Patient reports that he feels mildly short of breath but denies any wheezing or frank difficulty breathing. He reports pain to both of the sites along with pruritus. He denies any other complaints at this time. Patient did take Benadryl prior to arrival.   Past Medical History:  Diagnosis Date  . Hypertension   . Thyroid disease     There are no active problems to display for this patient.   Past Surgical History:  Procedure Laterality Date  . BACK SURGERY      Prior to Admission medications   Medication Sig Start Date End Date Taking? Authorizing Provider  aspirin EC 81 MG tablet Take 81 mg by mouth daily.    [provider]  budesonide-formoterol (SYMBICORT) 80-4.5 MCG/ACT inhaler Inhale 2 puffs into the lungs 2 (two) times daily.    [provider]  cetirizine (ZYRTEC) 10 MG tablet Take 10 mg by mouth daily.    [provider]  ibuprofen (ADVIL,MOTRIN) 800 MG tablet Take 1 tablet (800 mg total) by mouth every 8 (eight) hours as needed for moderate pain. 06/08/14   Everlene Balls, MD  levothyroxine (SYNTHROID, LEVOTHROID) 137 MCG tablet Take 137 mcg by mouth daily before breakfast.    [provider]  lisinopril (PRINIVIL,ZESTRIL) 10 MG tablet Take 10 mg by mouth daily.     [provider]  pravastatin (PRAVACHOL) 20 MG tablet Take 20 mg by mouth at bedtime.    [provider]  prazosin (MINIPRESS) 2 MG capsule Take 4 mg by mouth at bedtime.    [provider]  predniSONE (DELTASONE) 50 MG tablet Take 1 tablet (50 mg total) by mouth daily with breakfast. 11/27/16   Yang Rack, Charline Bills, PA-C  sertraline (ZOLOFT) 100 MG tablet Take 100 mg by mouth at bedtime.    [provider]  tiotropium (SPIRIVA) 18 MCG inhalation capsule Place 18 mcg into inhaler and inhale every evening.    [provider]    Allergies Feldene [piroxicam]  No family history on file.  Social History Social History  Substance Use Topics  . Smoking status: Former Research scientist (life sciences)  . Smokeless tobacco: Never Used  . Alcohol use No     Review of Systems  Constitutional: No fever/chills Eyes: No visual changes. ENT: No upper respiratory complaints. Cardiovascular: no chest pain. Respiratory: no cough. No SOB. Gastrointestinal: No abdominal pain.  No nausea, no vomiting.  Musculoskeletal: Negative for musculoskeletal pain. Skin: Negative for rash, abrasions, lacerations, ecchymosis. Positive for objective stings to the right posterior shoulder and left lower leg. Neurological: Negative for headaches, focal weakness or numbness. 10-point ROS otherwise negative.  ____________________________________________   PHYSICAL EXAM:  VITAL SIGNS: ED Triage Vitals  Enc Vitals Group     BP 11/27/16 1754 139/67     Pulse --      Resp 11/27/16 1754  18     Temp 11/27/16 1754 98.3 F (36.8 C)     Temp Source 11/27/16 1754 Oral     SpO2 --      Weight 11/27/16 1755 175 lb (79.4 kg)     Height 11/27/16 1755 5\' 8"  (1.727 m)     Head Circumference --      Peak Flow --      Pain Score --      Pain Loc --      Pain Edu? --      Excl. in [place]? --      Constitutional: Alert and oriented. Well appearing and in no acute distress. Eyes: Conjunctivae are  normal. PERRL. EOMI. Head: Atraumatic. ENT:      Ears:       Nose: No congestion/rhinnorhea.      Mouth/Throat: Mucous membranes are moist. No angioedema. Uvula was midline. Neck: No stridor.   Cardiovascular: Normal rate, regular rhythm. Normal S1 and S2.  Good peripheral circulation. Respiratory: Normal respiratory effort without tachypnea or retractions. Lungs CTAB. Good air entry to the bases with no decreased or absent breath sounds. Musculoskeletal: Full range of motion to all extremities. No gross deformities appreciated. Neurologic:  Normal speech and language. No gross focal neurologic deficits are appreciated.  Skin:  Skin is warm, dry and intact. No rash noted. Erythematous wheal noted to the right posterior shoulder. Measures approximately 3 cm in diameter. It is tender to palpation. No embedded stinger is visualized. Erythematous wheal noted to the left lateral leg. Measures approximately 2 cm in diameter. Raised. Area is tender to palpation. No embedded stinger. Psychiatric: Mood and affect are normal. Speech and behavior are normal. Patient exhibits appropriate insight and judgement.   ____________________________________________   LABS (all labs ordered are listed, but only abnormal results are displayed)  Labs Reviewed - No data to display ____________________________________________  EKG   ____________________________________________  RADIOLOGY   No results found.  ____________________________________________    PROCEDURES  Procedure(s) performed:    Procedures    Medications  methylPREDNISolone sodium succinate (SOLU-MEDROL) 125 mg/2 mL injection 125 mg (125 mg Intramuscular Given 11/27/16 1832)  diphenhydrAMINE (BENADRYL) injection 50 mg (50 mg Intramuscular Given 11/27/16 1828)  famotidine (PEPCID) tablet 20 mg (20 mg Oral Given 11/27/16 1826)     ____________________________________________   INITIAL IMPRESSION / ASSESSMENT AND PLAN / ED  COURSE  Pertinent labs & imaging results that were available during my care of the patient were reviewed by me and considered in my medical decision making (see chart for details).  Review of the [place] CSRS was performed in accordance of the [place] prior to dispensing any controlled drugs.  Clinical Course as of Nov 28 1914  Fri Nov 27, 2016  1810 Patient presents emergency department taking stung twice by all objective. Patient is highly allergic and is prescribed EpiPen. Patient reports pain to the site of the sting, mild shortness of breath feeling. Patient did not use his EpiPen prior to arrival. He did use Benadryl prior to arrival. Patient denies any frank difficulty breathing. He denies any wheezing or stridor. Current plan is to administer Solu-Medrol, diphenhydramine, famotidine and emergency department. Patient will be monitored and evaluated for a period of time. If no progression of symptoms, and exam remained stable, patient will be discharged with prescription for steroids and continued use of Benadryl.  [JC]    Clinical Course User Index [JC] Izel Hochberg, Charline Bills, PA-C    Patient's diagnosis is consistent with Bee  sting reaction. 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 He is stung twice by a yellowjacket. On presentation, patient felt mildly short of breath and had large wheals at sting site. After receiving an injection of steroid, diphenhydramine, famotidine, patient reports improvement in shortness of breath feeling. Lesions surrounding sting are improving initially. At this time, patient is safe for discharge. he does have a prescribed EpiPen at home.. Patient will be discharged home with prescriptions for 5 days of steroids. Patient is to follow up with primary care as needed or otherwise directed. Patient is given ED precautions to return to the ED for any worsening or new symptoms.     ____________________________________________  FINAL CLINICAL  IMPRESSION(S) / ED DIAGNOSES  Final diagnoses:  Bee sting reaction, accidental or unintentional, initial encounter      NEW MEDICATIONS STARTED DURING THIS VISIT:  New Prescriptions   PREDNISONE (DELTASONE) 50 MG TABLET    Take 1 tablet (50 mg total) by mouth daily with breakfast.        This chart was dictated using voice recognition software/Dragon. Despite best efforts to proofread, errors can occur which can change the meaning. Any change was purely unintentional.     Darletta Moll, PA-C 11/27/16 1916    Orbie Pyo, MD 11/27/16 2352

## 2016-11-27 NOTE — ED Triage Notes (Addendum)
Bee sting about one hour PTA.  Small amount of hives seen to back.  Patient states he feels SOB.  Benadryl taken PTA, did not use Epipen.  STates he is allergic to "all things that sting"  Lungs CTA. Voice clear and strong.  Skin warm and dry. NAD

## 2017-02-03 ENCOUNTER — Encounter (HOSPITAL_COMMUNITY): Payer: Self-pay | Admitting: *Deleted

## 2017-02-03 ENCOUNTER — Emergency Department (HOSPITAL_COMMUNITY)
Admission: EM | Admit: 2017-02-03 | Discharge: 2017-02-03 | Disposition: A | Discharge status: Home / Self Care | Payer: Medicare Other | Attending: Physician Assistant | Admitting: Physician Assistant

## 2017-02-03 DIAGNOSIS — T7840XA Allergy, unspecified, initial encounter: Secondary | ICD-10-CM | POA: Diagnosis not present

## 2017-02-03 DIAGNOSIS — Z79899 Other long term (current) drug therapy: Secondary | ICD-10-CM | POA: Insufficient documentation

## 2017-02-03 DIAGNOSIS — Z87891 Personal history of nicotine dependence: Secondary | ICD-10-CM | POA: Insufficient documentation

## 2017-02-03 DIAGNOSIS — I10 Essential (primary) hypertension: Secondary | ICD-10-CM | POA: Insufficient documentation

## 2017-02-03 DIAGNOSIS — T63441A Toxic effect of venom of bees, accidental (unintentional), initial encounter: Secondary | ICD-10-CM | POA: Insufficient documentation

## 2017-02-03 DIAGNOSIS — R229 Localized swelling, mass and lump, unspecified: Secondary | ICD-10-CM | POA: Diagnosis not present

## 2017-02-03 DIAGNOSIS — Z7982 Long term (current) use of aspirin: Secondary | ICD-10-CM | POA: Diagnosis not present

## 2017-02-03 MED ORDER — METHYLPREDNISOLONE SODIUM SUCC 125 MG IJ SOLR
125.0000 mg | Freq: Once | INTRAMUSCULAR | Status: AC
  Administered 2017-02-03: 125 mg via INTRAVENOUS
  Filled 2017-02-03: qty 2

## 2017-02-03 MED ORDER — EPINEPHRINE 0.3 MG/0.3ML IJ SOAJ: 0.3000 mg | Freq: Once | INTRAMUSCULAR | 0 refills | Status: AC

## 2017-02-03 MED ORDER — PREDNISONE 10 MG PO TABS: 40.0000 mg | ORAL_TABLET | Freq: Every day | ORAL | 0 refills | Status: AC

## 2017-02-03 NOTE — Discharge Instructions (Signed)
Please return with any return of symptoms.

## 2017-02-03 NOTE — ED Provider Notes (Signed)
[facility] EMERGENCY DEPARTMENT Provider Note   CSN: [id number] Arrival date & time: 02/03/17  1326     History   Chief Complaint Chief Complaint  Patient presents with  . Allergic Reaction    HPI Carlos Barrera is a 76 y.o. male.  HPI    Patient is 76 year old male with a known allergy to bee stings. He reports he was cutting a tree when a bunch of bees bit him. He has multiple bites on his body. He took an EpiPen prior to arrival. He is also given 50 mg of Benadryl by the fire department. Pt reports he had some SOB at first, now resolved.   Past Medical History:  Diagnosis Date  . Hypertension   . Thyroid disease     There are no active problems to display for this patient.   Past Surgical History:  Procedure Laterality Date  . BACK SURGERY         Home Medications    Prior to Admission medications   Medication Sig Start Date End Date Taking? Authorizing Provider  aspirin EC 81 MG tablet Take 81 mg by mouth daily.   Yes [provider]  budesonide-formoterol (SYMBICORT) 80-4.5 MCG/ACT inhaler Inhale 2 puffs into the lungs 2 (two) times daily.   Yes [provider]  cetirizine (ZYRTEC) 10 MG tablet Take 10 mg by mouth daily.   Yes [provider]  ibuprofen (ADVIL,MOTRIN) 800 MG tablet Take 1 tablet (800 mg total) by mouth every 8 (eight) hours as needed for moderate pain. 06/08/14  Yes Everlene Balls, MD  levothyroxine (SYNTHROID, LEVOTHROID) 137 MCG tablet Take 137 mcg by mouth daily before breakfast.   Yes [provider]  lisinopril (PRINIVIL,ZESTRIL) 10 MG tablet Take 10 mg by mouth daily.   Yes [provider]  pravastatin (PRAVACHOL) 20 MG tablet Take 20 mg by mouth at bedtime.   Yes [provider]  prazosin (MINIPRESS) 2 MG capsule Take 4 mg by mouth at bedtime.   Yes [provider]  sertraline (ZOLOFT) 100 MG tablet Take 100 mg by mouth at bedtime.   Yes [provider]  tiotropium (SPIRIVA) 18 MCG inhalation capsule Place 18 mcg into inhaler and inhale every evening.   Yes [provider]  predniSONE (DELTASONE) 50 MG tablet Take 1 tablet (50 mg total) by mouth daily with breakfast. Patient not taking: Reported on 02/03/2017 11/27/16   Cuthriell, Charline Bills, PA-C    Family History No family history on file.  Social History Social History  Substance Use Topics  . Smoking status: Former Research scientist (life sciences)  . Smokeless tobacco: Never Used  . Alcohol use No     Allergies   Feldene [piroxicam]   Review of Systems Review of Systems  Constitutional: Negative for activity change.  Respiratory: Positive for shortness of breath.   Cardiovascular: Negative for chest pain.  Gastrointestinal: Negative for abdominal pain.  Skin: Positive for color change.     Physical Exam Updated Vital Signs BP (!) 160/72   Pulse (!) 52   Temp 97.8 F (36.6 C) (Oral)   Resp 17   Ht 5\' 8"  (1.727 m)   Wt 81.6 kg (180 lb)   SpO2 95%   BMI 27.37 kg/m   Physical Exam  Constitutional: He is oriented to person, place, and time. He appears well-nourished.  HENT:  Head: Normocephalic.  Eyes: Conjunctivae are normal.  Cardiovascular: Normal rate.   Pulmonary/Chest: Effort normal and breath sounds normal.  Abdominal: Soft. He exhibits no distension. There is no tenderness.  Neurological: He is oriented to person, place, and time.  Skin: Skin is warm and dry. He is not diaphoretic.  Multiple small bee stings  Psychiatric: He has a normal mood and affect. His behavior is normal.     ED Treatments / Results  Labs (all labs ordered are listed, but only abnormal results are displayed) Labs Reviewed - No data to display  EKG  EKG Interpretation None       Radiology No results found.  Procedures Procedures (including critical care time)  Medications Ordered in ED Medications  methylPREDNISolone sodium succinate (SOLU-MEDROL) 125 mg/2 mL  injection 125 mg (not administered)     Initial Impression / Assessment and Plan / ED Course  I have reviewed the triage vital signs and the nursing notes.  Pertinent labs & imaging results that were available during my care of the patient were reviewed by me and considered in my medical decision making (see chart for details).     Patient is 76 year old male with a known allergy to bee stings. He reports he was cutting a tree when a bunch of bees bit him. He has multiple bites on his body. He took an EpiPen prior to arrival. He is also given 50 mg of Benadryl by the fire department. Pt reports he had some SOB at first, now resolved.   We'll give steroids. Observed for 4 hours.  Patient reports he has another EpiPen at home.    Final Clinical Impressions(s) / ED Diagnoses   Final diagnoses:  None    New Prescriptions New Prescriptions   No medications on file     Macarthur Critchley, MD 02/03/17 1440

## 2017-02-03 NOTE — ED Triage Notes (Signed)
Patient states he was cutting a stump and was stung 6-7  By yellow jackets, states he is allergic to anything that stings, patient gave himself epip, and drove himself to the [facility]. And was given Benadryl 50 mg. Up arrival to ed patient is alert oriented. States it feels like he is having problems swallowing at times however he is able to speak in complete sentences without hesitation.

## 2017-02-18 DIAGNOSIS — Z23 Encounter for immunization: Secondary | ICD-10-CM | POA: Diagnosis not present

## 2017-12-27 ENCOUNTER — Ambulatory Visit
Admission: RE | Admit: 2017-12-27 | Discharge: 2017-12-27 | Disposition: A | Discharge status: Home / Self Care | Payer: No Typology Code available for payment source | Source: Ambulatory Visit | Attending: Radiation Oncology | Admitting: Radiation Oncology

## 2017-12-27 ENCOUNTER — Other Ambulatory Visit: Payer: Self-pay

## 2017-12-27 ENCOUNTER — Encounter: Payer: Self-pay | Admitting: Radiation Oncology

## 2017-12-27 VITALS — BP 134/78 | HR 43 | Temp 96.7°F | Resp 18 | Wt 170.2 lb

## 2017-12-27 DIAGNOSIS — Z79899 Other long term (current) drug therapy: Secondary | ICD-10-CM | POA: Diagnosis not present

## 2017-12-27 DIAGNOSIS — E079 Disorder of thyroid, unspecified: Secondary | ICD-10-CM | POA: Diagnosis not present

## 2017-12-27 DIAGNOSIS — Z7982 Long term (current) use of aspirin: Secondary | ICD-10-CM | POA: Insufficient documentation

## 2017-12-27 DIAGNOSIS — I10 Essential (primary) hypertension: Secondary | ICD-10-CM | POA: Diagnosis not present

## 2017-12-27 DIAGNOSIS — C61 Malignant neoplasm of prostate: Secondary | ICD-10-CM | POA: Insufficient documentation

## 2017-12-27 NOTE — Consult Note (Signed)
NEW PATIENT EVALUATION  Name: Carlos Barrera  MRN: [id number]  Date:   12/27/2017     DOB: [birth year]   This 77 y.o. 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  REFERRING PHYSICIAN: No ref. provider found  CHIEF COMPLAINT:  Chief Complaint  Patient presents with  . Prostate Cancer    Pt is here for initial consulation of prostate cancer    DIAGNOSIS: The encounter diagnosis was Malignant neoplasm of prostate ([facility]).   PREVIOUS INVESTIGATIONS:  Pathology reports reviewed CT scan abdomen and pelvis as well as bone scan reports reviewed Clinical notes reviewed  HPI: patient is a 77 year old male from the [facility] who is had for several years and elevated PSA starting with a PSA of 3.4 back in October 2017. He was noted in January 2019 tablet PSA of 7.6 with an abnormal digital rectal exam showing right-sided nodularity. He had approximate 42 mm prostate. He underwent transrectal ultrasound-guided biopsy showing right-sided core biopsies 9 of 9 showing Gleason 9 (4+5). Perineural invasion was present. 9 biopsies of the left prostate showed benign tissue. Patient had MRI scan performed in August 2019 showing 1.4 cm focus within the right peripheral zone concerning for high-grade high-volume disease. No definitive gross extracapsular extension was identified no pelvic adenopathy was noted. The exam was performed without contrast based on patient's wishes. Bone scan was also unremarkable for metastatic disease. He is discussed treatments with urology is now referred to radiation oncology for consideration of treatment. He is doing fairly well he does have some dribbling nocturia 1specific frequency. Bowels are doing fine he has no bone pain.  PLANNED TREATMENT REGIMEN: I MRT radiation therapy with ADT therapy  PAST MEDICAL HISTORY:  has a past medical history of  Hypertension and Thyroid disease.    PAST SURGICAL HISTORY:  Past Surgical History:  Procedure Laterality Date  . BACK SURGERY      FAMILY HISTORY: family history is not on file.  SOCIAL HISTORY:  reports that he has quit smoking. He has never used smokeless tobacco. He reports that he does not drink alcohol or use drugs.  ALLERGIES: Feldene [piroxicam]  MEDICATIONS:  Current Outpatient Medications  Medication Sig Dispense Refill  . aspirin EC 81 MG tablet Take 81 mg by mouth daily.    . budesonide-formoterol (SYMBICORT) 80-4.5 MCG/ACT inhaler Inhale 2 puffs into the lungs 2 (two) times daily.    . cetirizine (ZYRTEC) 10 MG tablet Take 10 mg by mouth daily.    Marland Kitchen ibuprofen (ADVIL,MOTRIN) 800 MG tablet Take 1 tablet (800 mg total) by mouth every 8 (eight) hours as needed for moderate pain. 15 tablet 0  . levothyroxine (SYNTHROID, LEVOTHROID) 137 MCG tablet Take 137 mcg by mouth daily before breakfast.    . lisinopril (PRINIVIL,ZESTRIL) 10 MG tablet Take 10 mg by mouth daily.    . Meloxicam 15 MG TBDP Take by mouth daily as needed.    . pravastatin (PRAVACHOL) 20 MG tablet Take 20 mg by mouth at bedtime.    . prazosin (MINIPRESS) 2 MG capsule Take 4 mg by mouth at bedtime.    . ranitidine (ZANTAC) 150 MG tablet Take 150 mg by mouth at bedtime.    . sertraline (ZOLOFT) 100 MG tablet Take 100 mg by mouth at bedtime.    Marland Kitchen tiotropium (SPIRIVA) 18 MCG inhalation capsule Place 18 mcg into inhaler  and inhale every evening.     No current facility-administered medications for this encounter.     ECOG PERFORMANCE STATUS:  0 - Asymptomatic  REVIEW OF SYSTEMS:  Patient denies any weight loss, fatigue, weakness, fever, chills or night sweats. Patient denies any loss of vision, blurred vision. Patient denies any ringing  of the ears or hearing loss. No irregular heartbeat. Patient denies heart murmur or history of fainting. Patient denies any chest pain or pain radiating to her upper extremities.  Patient denies any shortness of breath, difficulty breathing at night, cough or hemoptysis. Patient denies any swelling in the lower legs. Patient denies any nausea vomiting, vomiting of blood, or coffee ground material in the vomitus. Patient denies any stomach pain. Patient states has had normal bowel movements no significant constipation or diarrhea. Patient denies any dysuria, hematuria or significant nocturia. Patient denies any problems walking, swelling in the joints or loss of balance. Patient denies any skin changes, loss of hair or loss of weight. Patient denies any excessive worrying or anxiety or significant depression. Patient denies any problems with insomnia. Patient denies excessive thirst, polyuria, polydipsia. Patient denies any swollen glands, patient denies easy bruising or easy bleeding. Patient denies any recent infections, allergies or URI. Patient "s visual fields have not changed significantly in recent time.    PHYSICAL EXAM: BP 134/78 (BP Location: Left Arm, Patient Position: Sitting)   Pulse (!) 43   Temp (!) 96.7 F (35.9 C) (Tympanic)   Resp 18   Wt 170 lb 3.1 oz (77.2 kg)   BMI 25.88 kg/m  On rectal exam rectal sphincter tone is good prostate is firm the right lateral lobe although the sulcus is preserved bilaterally. Remainder of rectal exam is unremarkable.Well-developed well-nourished patient in NAD. HEENT reveals PERLA, EOMI, discs not visualized.  Oral cavity is clear. No oral mucosal lesions are identified. Neck is clear without evidence of cervical or supraclavicular adenopathy. Lungs are clear to A&P. Cardiac examination is essentially unremarkable with regular rate and rhythm without murmur rub or thrill. Abdomen is benign with no organomegaly or masses noted. Motor sensory and DTR levels are equal and symmetric in the upper and lower extremities. Cranial nerves II through XII are grossly intact. Proprioception is intact. No peripheral adenopathy or edema is  identified. No motor or sensory levels are noted. Crude visual fields are within normal range.  LABORATORY DATA: pathology reports reviewed    RADIOLOGY RESULTS:bone scan and CT scan reports reviewed   IMPRESSION: stage IIB Gleason 9 (4+5) adenocarcinoma the prostate in 77 year old male  PLAN: at this time I have run the [facility] on the patient. This shows only a 33% chance at 5 years of progression free survival. He only has a 10% chance of organ confined disease and 89% chance of extracapsular extension with lymph node and seminal vesicle involvement around 35%. Based on these factors I believe he would be a good candidate for I am RT radiation therapy. I would treat both his pelvic lymph nodes as well as his prostate. We treated his prostate 8000 cGy over 8 weeks and his pelvic lymph nodes to 5400 cGy over 8 weeks using I MRT dose painting technique. Risks and benefits of treatment including fatigue alteration of blood counts increased lower urinary tract symptoms diarrhea and skin reaction all were discussed in detail with the patient. I would also like to start the patient on at least a year and a half to 2 years of androgen deprivation therapy  and have requested that. Patient seems to comprehend my treatment plan well. I first set up and ordered CT simulation for later this week.  I would like to take this opportunity to thank you for allowing me to participate in the care of your patient.Noreene Filbert, MD

## 2017-12-29 ENCOUNTER — Other Ambulatory Visit: Payer: Self-pay | Admitting: *Deleted

## 2017-12-29 DIAGNOSIS — C61 Malignant neoplasm of prostate: Secondary | ICD-10-CM

## 2017-12-30 ENCOUNTER — Inpatient Hospital Stay: Payer: No Typology Code available for payment source | Attending: Radiation Oncology

## 2017-12-30 ENCOUNTER — Ambulatory Visit
Admission: RE | Admit: 2017-12-30 | Discharge: 2017-12-30 | Disposition: A | Discharge status: Home / Self Care | Payer: No Typology Code available for payment source | Source: Ambulatory Visit | Attending: Radiation Oncology | Admitting: Radiation Oncology

## 2017-12-30 DIAGNOSIS — C61 Malignant neoplasm of prostate: Secondary | ICD-10-CM | POA: Insufficient documentation

## 2017-12-30 DIAGNOSIS — Z510 Encounter for antineoplastic radiation therapy: Secondary | ICD-10-CM | POA: Insufficient documentation

## 2017-12-30 DIAGNOSIS — Z5111 Encounter for antineoplastic chemotherapy: Secondary | ICD-10-CM | POA: Diagnosis present

## 2017-12-30 MED ORDER — LEUPROLIDE ACETATE (4 MONTH) 30 MG IM KIT
30.0000 mg | PACK | Freq: Once | INTRAMUSCULAR | Status: AC
  Administered 2017-12-30: 30 mg via INTRAMUSCULAR

## 2017-12-31 ENCOUNTER — Other Ambulatory Visit: Payer: Self-pay | Admitting: *Deleted

## 2017-12-31 DIAGNOSIS — C61 Malignant neoplasm of prostate: Secondary | ICD-10-CM

## 2018-01-03 DIAGNOSIS — Z510 Encounter for antineoplastic radiation therapy: Secondary | ICD-10-CM | POA: Diagnosis not present

## 2018-01-05 ENCOUNTER — Ambulatory Visit: Payer: No Typology Code available for payment source

## 2018-01-06 ENCOUNTER — Ambulatory Visit
Admission: RE | Admit: 2018-01-06 | Discharge: 2018-01-06 | Disposition: A | Discharge status: Home / Self Care | Payer: No Typology Code available for payment source | Source: Ambulatory Visit | Attending: Radiation Oncology | Admitting: Radiation Oncology

## 2018-01-06 ENCOUNTER — Ambulatory Visit: Payer: No Typology Code available for payment source

## 2018-01-07 ENCOUNTER — Ambulatory Visit: Payer: No Typology Code available for payment source

## 2018-01-10 ENCOUNTER — Ambulatory Visit: Payer: No Typology Code available for payment source

## 2018-01-10 ENCOUNTER — Ambulatory Visit
Admission: RE | Admit: 2018-01-10 | Discharge: 2018-01-10 | Disposition: A | Discharge status: Home / Self Care | Payer: No Typology Code available for payment source | Source: Ambulatory Visit | Attending: Radiation Oncology | Admitting: Radiation Oncology

## 2018-01-10 DIAGNOSIS — Z510 Encounter for antineoplastic radiation therapy: Secondary | ICD-10-CM | POA: Diagnosis not present

## 2018-01-11 ENCOUNTER — Ambulatory Visit
Admission: RE | Admit: 2018-01-11 | Discharge: 2018-01-11 | Disposition: A | Discharge status: Home / Self Care | Payer: No Typology Code available for payment source | Source: Ambulatory Visit | Attending: Radiation Oncology | Admitting: Radiation Oncology

## 2018-01-11 ENCOUNTER — Ambulatory Visit: Payer: No Typology Code available for payment source

## 2018-01-11 DIAGNOSIS — Z510 Encounter for antineoplastic radiation therapy: Secondary | ICD-10-CM | POA: Diagnosis not present

## 2018-01-12 ENCOUNTER — Ambulatory Visit: Payer: No Typology Code available for payment source

## 2018-01-12 ENCOUNTER — Ambulatory Visit
Admission: RE | Admit: 2018-01-12 | Discharge: 2018-01-12 | Disposition: A | Discharge status: Home / Self Care | Payer: No Typology Code available for payment source | Source: Ambulatory Visit | Attending: Radiation Oncology | Admitting: Radiation Oncology

## 2018-01-12 DIAGNOSIS — Z510 Encounter for antineoplastic radiation therapy: Secondary | ICD-10-CM | POA: Diagnosis not present

## 2018-01-13 ENCOUNTER — Ambulatory Visit
Admission: RE | Admit: 2018-01-13 | Discharge: 2018-01-13 | Disposition: A | Discharge status: Home / Self Care | Payer: No Typology Code available for payment source | Source: Ambulatory Visit | Attending: Radiation Oncology | Admitting: Radiation Oncology

## 2018-01-13 ENCOUNTER — Ambulatory Visit: Payer: No Typology Code available for payment source

## 2018-01-13 DIAGNOSIS — Z510 Encounter for antineoplastic radiation therapy: Secondary | ICD-10-CM | POA: Diagnosis not present

## 2018-01-14 ENCOUNTER — Ambulatory Visit: Payer: No Typology Code available for payment source

## 2018-01-14 ENCOUNTER — Ambulatory Visit
Admission: RE | Admit: 2018-01-14 | Discharge: 2018-01-14 | Disposition: A | Discharge status: Home / Self Care | Payer: No Typology Code available for payment source | Source: Ambulatory Visit | Attending: Radiation Oncology | Admitting: Radiation Oncology

## 2018-01-14 DIAGNOSIS — Z510 Encounter for antineoplastic radiation therapy: Secondary | ICD-10-CM | POA: Diagnosis not present

## 2018-01-17 ENCOUNTER — Ambulatory Visit: Payer: No Typology Code available for payment source

## 2018-01-17 ENCOUNTER — Ambulatory Visit
Admission: RE | Admit: 2018-01-17 | Discharge: 2018-01-17 | Disposition: A | Discharge status: Home / Self Care | Payer: No Typology Code available for payment source | Source: Ambulatory Visit | Attending: Radiation Oncology | Admitting: Radiation Oncology

## 2018-01-17 DIAGNOSIS — Z510 Encounter for antineoplastic radiation therapy: Secondary | ICD-10-CM | POA: Diagnosis not present

## 2018-01-18 ENCOUNTER — Ambulatory Visit: Payer: No Typology Code available for payment source

## 2018-01-18 ENCOUNTER — Ambulatory Visit
Admission: RE | Admit: 2018-01-18 | Discharge: 2018-01-18 | Disposition: A | Discharge status: Home / Self Care | Payer: No Typology Code available for payment source | Source: Ambulatory Visit | Attending: Radiation Oncology | Admitting: Radiation Oncology

## 2018-01-18 DIAGNOSIS — C61 Malignant neoplasm of prostate: Secondary | ICD-10-CM | POA: Diagnosis not present

## 2018-01-18 DIAGNOSIS — Z510 Encounter for antineoplastic radiation therapy: Secondary | ICD-10-CM | POA: Insufficient documentation

## 2018-01-19 ENCOUNTER — Ambulatory Visit: Payer: No Typology Code available for payment source

## 2018-01-19 ENCOUNTER — Ambulatory Visit
Admission: RE | Admit: 2018-01-19 | Discharge: 2018-01-19 | Disposition: A | Discharge status: Home / Self Care | Payer: No Typology Code available for payment source | Source: Ambulatory Visit | Attending: Radiation Oncology | Admitting: Radiation Oncology

## 2018-01-19 DIAGNOSIS — Z510 Encounter for antineoplastic radiation therapy: Secondary | ICD-10-CM | POA: Diagnosis not present

## 2018-01-20 ENCOUNTER — Ambulatory Visit: Payer: No Typology Code available for payment source

## 2018-01-21 ENCOUNTER — Ambulatory Visit: Payer: No Typology Code available for payment source

## 2018-01-24 ENCOUNTER — Ambulatory Visit
Admission: RE | Admit: 2018-01-24 | Discharge: 2018-01-24 | Disposition: A | Discharge status: Home / Self Care | Payer: No Typology Code available for payment source | Source: Ambulatory Visit | Attending: Radiation Oncology | Admitting: Radiation Oncology

## 2018-01-24 ENCOUNTER — Ambulatory Visit: Payer: No Typology Code available for payment source

## 2018-01-24 DIAGNOSIS — Z510 Encounter for antineoplastic radiation therapy: Secondary | ICD-10-CM | POA: Diagnosis not present

## 2018-01-25 ENCOUNTER — Inpatient Hospital Stay: Payer: Medicare Other | Attending: Radiation Oncology

## 2018-01-25 ENCOUNTER — Ambulatory Visit: Payer: No Typology Code available for payment source

## 2018-01-25 ENCOUNTER — Ambulatory Visit
Admission: RE | Admit: 2018-01-25 | Discharge: 2018-01-25 | Disposition: A | Discharge status: Home / Self Care | Payer: No Typology Code available for payment source | Source: Ambulatory Visit | Attending: Radiation Oncology | Admitting: Radiation Oncology

## 2018-01-25 DIAGNOSIS — C61 Malignant neoplasm of prostate: Secondary | ICD-10-CM | POA: Insufficient documentation

## 2018-01-25 DIAGNOSIS — Z510 Encounter for antineoplastic radiation therapy: Secondary | ICD-10-CM | POA: Diagnosis not present

## 2018-01-25 LAB — CBC
HCT: 38.1 % — ABNORMAL LOW (ref 40.0–52.0)
Hemoglobin: 13.4 g/dL (ref 13.0–18.0)
MCH: 33.6 pg (ref 26.0–34.0)
MCHC: 35.1 g/dL (ref 32.0–36.0)
MCV: 95.9 fL (ref 80.0–100.0)
PLATELETS: 142 10*3/uL — AB (ref 150–440)
RBC: 3.98 MIL/uL — ABNORMAL LOW (ref 4.40–5.90)
RDW: 12.9 % (ref 11.5–14.5)
WBC: 4.8 10*3/uL (ref 3.8–10.6)

## 2018-01-26 ENCOUNTER — Ambulatory Visit
Admission: RE | Admit: 2018-01-26 | Discharge: 2018-01-26 | Disposition: A | Discharge status: Home / Self Care | Payer: No Typology Code available for payment source | Source: Ambulatory Visit | Attending: Radiation Oncology | Admitting: Radiation Oncology

## 2018-01-26 ENCOUNTER — Ambulatory Visit: Payer: No Typology Code available for payment source

## 2018-01-26 DIAGNOSIS — Z510 Encounter for antineoplastic radiation therapy: Secondary | ICD-10-CM | POA: Diagnosis not present

## 2018-01-27 ENCOUNTER — Ambulatory Visit
Admission: RE | Admit: 2018-01-27 | Discharge: 2018-01-27 | Disposition: A | Discharge status: Home / Self Care | Payer: No Typology Code available for payment source | Source: Ambulatory Visit | Attending: Radiation Oncology | Admitting: Radiation Oncology

## 2018-01-27 ENCOUNTER — Ambulatory Visit: Payer: No Typology Code available for payment source

## 2018-01-27 DIAGNOSIS — Z510 Encounter for antineoplastic radiation therapy: Secondary | ICD-10-CM | POA: Diagnosis not present

## 2018-01-28 ENCOUNTER — Ambulatory Visit
Admission: RE | Admit: 2018-01-28 | Discharge: 2018-01-28 | Disposition: A | Discharge status: Home / Self Care | Payer: No Typology Code available for payment source | Source: Ambulatory Visit | Attending: Radiation Oncology | Admitting: Radiation Oncology

## 2018-01-28 ENCOUNTER — Ambulatory Visit: Payer: No Typology Code available for payment source

## 2018-01-28 DIAGNOSIS — Z510 Encounter for antineoplastic radiation therapy: Secondary | ICD-10-CM | POA: Diagnosis not present

## 2018-01-31 ENCOUNTER — Ambulatory Visit: Payer: No Typology Code available for payment source

## 2018-01-31 ENCOUNTER — Ambulatory Visit
Admission: RE | Admit: 2018-01-31 | Discharge: 2018-01-31 | Disposition: A | Discharge status: Home / Self Care | Payer: No Typology Code available for payment source | Source: Ambulatory Visit | Attending: Radiation Oncology | Admitting: Radiation Oncology

## 2018-01-31 DIAGNOSIS — Z510 Encounter for antineoplastic radiation therapy: Secondary | ICD-10-CM | POA: Diagnosis not present

## 2018-02-01 ENCOUNTER — Ambulatory Visit: Payer: No Typology Code available for payment source

## 2018-02-01 ENCOUNTER — Ambulatory Visit
Admission: RE | Admit: 2018-02-01 | Discharge: 2018-02-01 | Disposition: A | Discharge status: Home / Self Care | Payer: No Typology Code available for payment source | Source: Ambulatory Visit | Attending: Radiation Oncology | Admitting: Radiation Oncology

## 2018-02-01 DIAGNOSIS — Z510 Encounter for antineoplastic radiation therapy: Secondary | ICD-10-CM | POA: Diagnosis not present

## 2018-02-02 ENCOUNTER — Ambulatory Visit
Admission: RE | Admit: 2018-02-02 | Discharge: 2018-02-02 | Disposition: A | Discharge status: Home / Self Care | Payer: No Typology Code available for payment source | Source: Ambulatory Visit | Attending: Radiation Oncology | Admitting: Radiation Oncology

## 2018-02-02 ENCOUNTER — Ambulatory Visit: Payer: No Typology Code available for payment source

## 2018-02-02 DIAGNOSIS — Z510 Encounter for antineoplastic radiation therapy: Secondary | ICD-10-CM | POA: Diagnosis not present

## 2018-02-03 ENCOUNTER — Ambulatory Visit: Payer: No Typology Code available for payment source

## 2018-02-03 ENCOUNTER — Ambulatory Visit
Admission: RE | Admit: 2018-02-03 | Discharge: 2018-02-03 | Disposition: A | Discharge status: Home / Self Care | Payer: No Typology Code available for payment source | Source: Ambulatory Visit | Attending: Radiation Oncology | Admitting: Radiation Oncology

## 2018-02-03 DIAGNOSIS — Z510 Encounter for antineoplastic radiation therapy: Secondary | ICD-10-CM | POA: Diagnosis not present

## 2018-02-04 ENCOUNTER — Ambulatory Visit
Admission: RE | Admit: 2018-02-04 | Discharge: 2018-02-04 | Disposition: A | Discharge status: Home / Self Care | Payer: No Typology Code available for payment source | Source: Ambulatory Visit | Attending: Radiation Oncology | Admitting: Radiation Oncology

## 2018-02-04 ENCOUNTER — Ambulatory Visit: Payer: No Typology Code available for payment source

## 2018-02-04 DIAGNOSIS — Z510 Encounter for antineoplastic radiation therapy: Secondary | ICD-10-CM | POA: Diagnosis not present

## 2018-02-07 ENCOUNTER — Ambulatory Visit
Admission: RE | Admit: 2018-02-07 | Discharge: 2018-02-07 | Disposition: A | Discharge status: Home / Self Care | Payer: No Typology Code available for payment source | Source: Ambulatory Visit | Attending: Radiation Oncology | Admitting: Radiation Oncology

## 2018-02-07 ENCOUNTER — Ambulatory Visit: Payer: No Typology Code available for payment source

## 2018-02-07 DIAGNOSIS — Z510 Encounter for antineoplastic radiation therapy: Secondary | ICD-10-CM | POA: Diagnosis not present

## 2018-02-08 ENCOUNTER — Inpatient Hospital Stay: Payer: Medicare Other

## 2018-02-08 ENCOUNTER — Ambulatory Visit: Payer: No Typology Code available for payment source

## 2018-02-08 ENCOUNTER — Ambulatory Visit
Admission: RE | Admit: 2018-02-08 | Discharge: 2018-02-08 | Disposition: A | Discharge status: Home / Self Care | Payer: No Typology Code available for payment source | Source: Ambulatory Visit | Attending: Radiation Oncology | Admitting: Radiation Oncology

## 2018-02-08 DIAGNOSIS — C61 Malignant neoplasm of prostate: Secondary | ICD-10-CM | POA: Diagnosis not present

## 2018-02-08 DIAGNOSIS — Z510 Encounter for antineoplastic radiation therapy: Secondary | ICD-10-CM | POA: Diagnosis not present

## 2018-02-08 LAB — CBC
HCT: 38.4 % — ABNORMAL LOW (ref 39.0–52.0)
Hemoglobin: 13 g/dL (ref 13.0–17.0)
MCH: 31.9 pg (ref 26.0–34.0)
MCHC: 33.9 g/dL (ref 30.0–36.0)
MCV: 94.1 fL (ref 80.0–100.0)
PLATELETS: 175 10*3/uL (ref 150–400)
RBC: 4.08 MIL/uL — AB (ref 4.22–5.81)
RDW: 12 % (ref 11.5–15.5)
WBC: 4.9 10*3/uL (ref 4.0–10.5)
nRBC: 0 % (ref 0.0–0.2)

## 2018-02-09 ENCOUNTER — Ambulatory Visit
Admission: RE | Admit: 2018-02-09 | Discharge: 2018-02-09 | Disposition: A | Discharge status: Home / Self Care | Payer: No Typology Code available for payment source | Source: Ambulatory Visit | Attending: Radiation Oncology | Admitting: Radiation Oncology

## 2018-02-09 ENCOUNTER — Ambulatory Visit: Payer: No Typology Code available for payment source

## 2018-02-09 DIAGNOSIS — Z510 Encounter for antineoplastic radiation therapy: Secondary | ICD-10-CM | POA: Diagnosis not present

## 2018-02-10 ENCOUNTER — Ambulatory Visit
Admission: RE | Admit: 2018-02-10 | Discharge: 2018-02-10 | Disposition: A | Discharge status: Home / Self Care | Payer: No Typology Code available for payment source | Source: Ambulatory Visit | Attending: Radiation Oncology | Admitting: Radiation Oncology

## 2018-02-10 ENCOUNTER — Ambulatory Visit: Payer: No Typology Code available for payment source

## 2018-02-10 DIAGNOSIS — Z510 Encounter for antineoplastic radiation therapy: Secondary | ICD-10-CM | POA: Diagnosis not present

## 2018-02-11 ENCOUNTER — Ambulatory Visit
Admission: RE | Admit: 2018-02-11 | Discharge: 2018-02-11 | Disposition: A | Discharge status: Home / Self Care | Payer: No Typology Code available for payment source | Source: Ambulatory Visit | Attending: Radiation Oncology | Admitting: Radiation Oncology

## 2018-02-11 ENCOUNTER — Ambulatory Visit: Payer: No Typology Code available for payment source

## 2018-02-11 DIAGNOSIS — Z510 Encounter for antineoplastic radiation therapy: Secondary | ICD-10-CM | POA: Diagnosis not present

## 2018-02-14 ENCOUNTER — Ambulatory Visit: Payer: No Typology Code available for payment source

## 2018-02-14 ENCOUNTER — Ambulatory Visit
Admission: RE | Admit: 2018-02-14 | Discharge: 2018-02-14 | Disposition: A | Discharge status: Home / Self Care | Payer: No Typology Code available for payment source | Source: Ambulatory Visit | Attending: Radiation Oncology | Admitting: Radiation Oncology

## 2018-02-14 DIAGNOSIS — Z510 Encounter for antineoplastic radiation therapy: Secondary | ICD-10-CM | POA: Diagnosis not present

## 2018-02-15 ENCOUNTER — Ambulatory Visit: Payer: No Typology Code available for payment source

## 2018-02-15 ENCOUNTER — Ambulatory Visit
Admission: RE | Admit: 2018-02-15 | Discharge: 2018-02-15 | Disposition: A | Discharge status: Home / Self Care | Payer: No Typology Code available for payment source | Source: Ambulatory Visit | Attending: Radiation Oncology | Admitting: Radiation Oncology

## 2018-02-15 DIAGNOSIS — Z510 Encounter for antineoplastic radiation therapy: Secondary | ICD-10-CM | POA: Diagnosis not present

## 2018-02-16 ENCOUNTER — Ambulatory Visit: Payer: No Typology Code available for payment source

## 2018-02-16 ENCOUNTER — Ambulatory Visit
Admission: RE | Admit: 2018-02-16 | Discharge: 2018-02-16 | Disposition: A | Discharge status: Home / Self Care | Payer: No Typology Code available for payment source | Source: Ambulatory Visit | Attending: Radiation Oncology | Admitting: Radiation Oncology

## 2018-02-16 DIAGNOSIS — Z510 Encounter for antineoplastic radiation therapy: Secondary | ICD-10-CM | POA: Diagnosis not present

## 2018-02-17 ENCOUNTER — Ambulatory Visit: Payer: No Typology Code available for payment source

## 2018-02-17 ENCOUNTER — Ambulatory Visit
Admission: RE | Admit: 2018-02-17 | Discharge: 2018-02-17 | Disposition: A | Discharge status: Home / Self Care | Payer: No Typology Code available for payment source | Source: Ambulatory Visit | Attending: Radiation Oncology | Admitting: Radiation Oncology

## 2018-02-17 DIAGNOSIS — Z510 Encounter for antineoplastic radiation therapy: Secondary | ICD-10-CM | POA: Diagnosis not present

## 2018-02-18 ENCOUNTER — Ambulatory Visit: Payer: No Typology Code available for payment source

## 2018-02-18 ENCOUNTER — Ambulatory Visit
Admission: RE | Admit: 2018-02-18 | Discharge: 2018-02-18 | Disposition: A | Discharge status: Home / Self Care | Payer: No Typology Code available for payment source | Source: Ambulatory Visit | Attending: Radiation Oncology | Admitting: Radiation Oncology

## 2018-02-18 DIAGNOSIS — Z510 Encounter for antineoplastic radiation therapy: Secondary | ICD-10-CM | POA: Diagnosis present

## 2018-02-18 DIAGNOSIS — C61 Malignant neoplasm of prostate: Secondary | ICD-10-CM | POA: Diagnosis not present

## 2018-02-21 ENCOUNTER — Ambulatory Visit
Admission: RE | Admit: 2018-02-21 | Discharge: 2018-02-21 | Disposition: A | Discharge status: Home / Self Care | Payer: No Typology Code available for payment source | Source: Ambulatory Visit | Attending: Radiation Oncology | Admitting: Radiation Oncology

## 2018-02-21 ENCOUNTER — Ambulatory Visit: Payer: No Typology Code available for payment source

## 2018-02-21 DIAGNOSIS — Z510 Encounter for antineoplastic radiation therapy: Secondary | ICD-10-CM | POA: Diagnosis not present

## 2018-02-22 ENCOUNTER — Inpatient Hospital Stay: Payer: Medicare Other | Attending: Radiation Oncology

## 2018-02-22 ENCOUNTER — Ambulatory Visit
Admission: RE | Admit: 2018-02-22 | Discharge: 2018-02-22 | Disposition: A | Discharge status: Home / Self Care | Payer: No Typology Code available for payment source | Source: Ambulatory Visit | Attending: Radiation Oncology | Admitting: Radiation Oncology

## 2018-02-22 ENCOUNTER — Other Ambulatory Visit: Payer: Self-pay

## 2018-02-22 ENCOUNTER — Ambulatory Visit: Payer: No Typology Code available for payment source

## 2018-02-22 DIAGNOSIS — C61 Malignant neoplasm of prostate: Secondary | ICD-10-CM

## 2018-02-22 DIAGNOSIS — Z510 Encounter for antineoplastic radiation therapy: Secondary | ICD-10-CM | POA: Diagnosis not present

## 2018-02-22 LAB — CBC
HCT: 37 % — ABNORMAL LOW (ref 39.0–52.0)
Hemoglobin: 12.4 g/dL — ABNORMAL LOW (ref 13.0–17.0)
MCH: 32 pg (ref 26.0–34.0)
MCHC: 33.5 g/dL (ref 30.0–36.0)
MCV: 95.4 fL (ref 80.0–100.0)
PLATELETS: 158 10*3/uL (ref 150–400)
RBC: 3.88 MIL/uL — ABNORMAL LOW (ref 4.22–5.81)
RDW: 12.5 % (ref 11.5–15.5)
WBC: 4.2 10*3/uL (ref 4.0–10.5)
nRBC: 0 % (ref 0.0–0.2)

## 2018-02-23 ENCOUNTER — Ambulatory Visit
Admission: RE | Admit: 2018-02-23 | Discharge: 2018-02-23 | Disposition: A | Discharge status: Home / Self Care | Payer: No Typology Code available for payment source | Source: Ambulatory Visit | Attending: Radiation Oncology | Admitting: Radiation Oncology

## 2018-02-23 ENCOUNTER — Ambulatory Visit: Payer: No Typology Code available for payment source

## 2018-02-23 DIAGNOSIS — Z510 Encounter for antineoplastic radiation therapy: Secondary | ICD-10-CM | POA: Diagnosis not present

## 2018-02-24 ENCOUNTER — Ambulatory Visit: Payer: No Typology Code available for payment source

## 2018-02-24 ENCOUNTER — Ambulatory Visit
Admission: RE | Admit: 2018-02-24 | Discharge: 2018-02-24 | Disposition: A | Discharge status: Home / Self Care | Payer: No Typology Code available for payment source | Source: Ambulatory Visit | Attending: Radiation Oncology | Admitting: Radiation Oncology

## 2018-02-24 DIAGNOSIS — Z510 Encounter for antineoplastic radiation therapy: Secondary | ICD-10-CM | POA: Diagnosis not present

## 2018-02-25 ENCOUNTER — Ambulatory Visit: Payer: No Typology Code available for payment source

## 2018-02-25 ENCOUNTER — Ambulatory Visit
Admission: RE | Admit: 2018-02-25 | Discharge: 2018-02-25 | Disposition: A | Discharge status: Home / Self Care | Payer: No Typology Code available for payment source | Source: Ambulatory Visit | Attending: Radiation Oncology | Admitting: Radiation Oncology

## 2018-02-25 DIAGNOSIS — Z510 Encounter for antineoplastic radiation therapy: Secondary | ICD-10-CM | POA: Diagnosis not present

## 2018-02-28 ENCOUNTER — Ambulatory Visit: Payer: No Typology Code available for payment source

## 2018-02-28 ENCOUNTER — Ambulatory Visit
Admission: RE | Admit: 2018-02-28 | Discharge: 2018-02-28 | Disposition: A | Discharge status: Home / Self Care | Payer: No Typology Code available for payment source | Source: Ambulatory Visit | Attending: Radiation Oncology | Admitting: Radiation Oncology

## 2018-02-28 DIAGNOSIS — Z510 Encounter for antineoplastic radiation therapy: Secondary | ICD-10-CM | POA: Diagnosis not present

## 2018-03-01 ENCOUNTER — Ambulatory Visit: Payer: No Typology Code available for payment source

## 2018-03-01 ENCOUNTER — Ambulatory Visit
Admission: RE | Admit: 2018-03-01 | Discharge: 2018-03-01 | Disposition: A | Discharge status: Home / Self Care | Payer: No Typology Code available for payment source | Source: Ambulatory Visit | Attending: Radiation Oncology | Admitting: Radiation Oncology

## 2018-03-01 DIAGNOSIS — Z510 Encounter for antineoplastic radiation therapy: Secondary | ICD-10-CM | POA: Diagnosis not present

## 2018-03-02 ENCOUNTER — Ambulatory Visit: Payer: No Typology Code available for payment source

## 2018-03-02 ENCOUNTER — Ambulatory Visit
Admission: RE | Admit: 2018-03-02 | Discharge: 2018-03-02 | Disposition: A | Discharge status: Home / Self Care | Payer: No Typology Code available for payment source | Source: Ambulatory Visit | Attending: Radiation Oncology | Admitting: Radiation Oncology

## 2018-03-02 DIAGNOSIS — Z510 Encounter for antineoplastic radiation therapy: Secondary | ICD-10-CM | POA: Diagnosis not present

## 2018-03-03 ENCOUNTER — Ambulatory Visit
Admission: RE | Admit: 2018-03-03 | Discharge: 2018-03-03 | Disposition: A | Discharge status: Home / Self Care | Payer: No Typology Code available for payment source | Source: Ambulatory Visit | Attending: Radiation Oncology | Admitting: Radiation Oncology

## 2018-03-03 DIAGNOSIS — Z510 Encounter for antineoplastic radiation therapy: Secondary | ICD-10-CM | POA: Diagnosis not present

## 2018-03-04 ENCOUNTER — Ambulatory Visit: Payer: No Typology Code available for payment source

## 2018-03-04 ENCOUNTER — Ambulatory Visit
Admission: RE | Admit: 2018-03-04 | Discharge: 2018-03-04 | Disposition: A | Discharge status: Home / Self Care | Payer: No Typology Code available for payment source | Source: Ambulatory Visit | Attending: Radiation Oncology | Admitting: Radiation Oncology

## 2018-03-04 DIAGNOSIS — Z510 Encounter for antineoplastic radiation therapy: Secondary | ICD-10-CM | POA: Diagnosis not present

## 2018-03-07 ENCOUNTER — Ambulatory Visit
Admission: RE | Admit: 2018-03-07 | Discharge: 2018-03-07 | Disposition: A | Discharge status: Home / Self Care | Payer: No Typology Code available for payment source | Source: Ambulatory Visit | Attending: Radiation Oncology | Admitting: Radiation Oncology

## 2018-03-07 DIAGNOSIS — Z510 Encounter for antineoplastic radiation therapy: Secondary | ICD-10-CM | POA: Diagnosis not present

## 2018-03-08 ENCOUNTER — Ambulatory Visit
Admission: RE | Admit: 2018-03-08 | Discharge: 2018-03-08 | Disposition: A | Discharge status: Home / Self Care | Payer: No Typology Code available for payment source | Source: Ambulatory Visit | Attending: Radiation Oncology | Admitting: Radiation Oncology

## 2018-03-08 ENCOUNTER — Encounter: Payer: Self-pay | Admitting: *Deleted

## 2018-03-08 DIAGNOSIS — Z510 Encounter for antineoplastic radiation therapy: Secondary | ICD-10-CM | POA: Diagnosis not present

## 2018-04-15 ENCOUNTER — Other Ambulatory Visit: Payer: Self-pay | Admitting: *Deleted

## 2018-04-15 ENCOUNTER — Encounter: Payer: Self-pay | Admitting: *Deleted

## 2018-04-15 ENCOUNTER — Encounter: Payer: Self-pay | Admitting: Radiation Oncology

## 2018-04-15 ENCOUNTER — Ambulatory Visit
Admission: RE | Admit: 2018-04-15 | Discharge: 2018-04-15 | Disposition: A | Discharge status: Home / Self Care | Payer: Medicare Other | Source: Ambulatory Visit | Attending: Radiation Oncology | Admitting: Radiation Oncology

## 2018-04-15 ENCOUNTER — Other Ambulatory Visit: Payer: Self-pay

## 2018-04-15 DIAGNOSIS — C61 Malignant neoplasm of prostate: Secondary | ICD-10-CM

## 2018-04-15 NOTE — Progress Notes (Signed)
Radiation Oncology Follow up Note  Name: Carlos Barrera   Date:   04/15/2018 MRN:  [id number] DOB: 06/24/1940    This 77 y.o. male presents to the clinic today for one-month follow-up status post I MRT radiation therapy for Gleason 9 (4+5 current) adenocarcinoma prostate presenting the PSA of 7.6.  REFERRING PROVIDER: No ref. provider found  HPI: patient is a 77 year old male with intermediate risk stage IIBand Gleason 9 (4+5) adenocarcinoma the prostate presenting with a PSA of 7.6. He has been on androgen deprivation therapy. He is seen today and is doing well 1 month out specifically denies diarrhea dysuria or any other GI/GU complaints.  COMPLICATIONS OF TREATMENT: none  FOLLOW UP COMPLIANCE: keeps appointments   PHYSICAL EXAM:  BP 124/65   Pulse 76   Temp (!) 97.3 F (36.3 C)   Resp 18   Wt 170 lb 15.5 oz (77.6 kg)   BMI 26.00 kg/m  Well-developed well-nourished patient in NAD. HEENT reveals PERLA, EOMI, discs not visualized.  Oral cavity is clear. No oral mucosal lesions are identified. Neck is clear without evidence of cervical or supraclavicular adenopathy. Lungs are clear to A&P. Cardiac examination is essentially unremarkable with regular rate and rhythm without murmur rub or thrill. Abdomen is benign with no organomegaly or masses noted. Motor sensory and DTR levels are equal and symmetric in the upper and lower extremities. Cranial nerves II through XII are grossly intact. Proprioception is intact. No peripheral adenopathy or edema is identified. No motor or sensory levels are noted. Crude visual fields are within normal range.  RADIOLOGY RESULTS: no current films for review  PLAN: at this time I'm referring to Dr. Cherrie Gauze office to continue on his androgen deprivation therapy probably for a year and a half to 2 years. I've asked to see him back in 3 months with a PSA prior to that visit. He has a very low side effect profile is doing well patient Has my treatment  plan.  I would like to take this opportunity to thank you for allowing me to participate in the care of your patient.Noreene Filbert, MD

## 2018-05-05 ENCOUNTER — Ambulatory Visit: Payer: Self-pay | Admitting: Urology

## 2018-05-10 ENCOUNTER — Encounter: Payer: Self-pay | Admitting: Urology

## 2018-05-10 ENCOUNTER — Ambulatory Visit (INDEPENDENT_AMBULATORY_CARE_PROVIDER_SITE_OTHER): Payer: Medicare Other | Admitting: Urology

## 2018-05-10 VITALS — BP 106/65 | HR 85 | Ht 67.0 in | Wt 176.2 lb

## 2018-05-10 DIAGNOSIS — C61 Malignant neoplasm of prostate: Secondary | ICD-10-CM

## 2018-05-10 MED ORDER — LEUPROLIDE ACETATE (6 MONTH) 45 MG IM KIT
45.0000 mg | PACK | Freq: Once | INTRAMUSCULAR | Status: AC
  Administered 2018-05-10: 45 mg via INTRAMUSCULAR

## 2018-05-10 NOTE — Patient Instructions (Signed)
Please begin a calcium and vitamin D supplement while on Lupron injections   Leuprolide injection What is this medicine? LEUPROLIDE (loo PROE lide) is a man-made hormone. It is used to treat the symptoms of prostate cancer. This medicine may also be used to treat children with early onset of puberty. It may be used for other hormonal conditions. This medicine may be used for other purposes; ask your health care provider or pharmacist if you have questions. COMMON BRAND NAME(S): Lupron What should I tell my health care provider before I take this medicine? They need to know if you have any of these conditions: -diabetes -heart disease or previous heart attack -high blood pressure -high cholesterol -pain or difficulty passing urine -spinal cord metastasis -stroke -tobacco smoker -an unusual or allergic reaction to leuprolide, benzyl alcohol, other medicines, foods, dyes, or preservatives -pregnant or trying to get pregnant -breast-feeding How should I use this medicine? This medicine is for injection under the skin or into a muscle. You will be taught how to prepare and give this medicine. Use exactly as directed. Take your medicine at regular intervals. Do not take your medicine more often than directed. It is important that you put your used needles and syringes in a special sharps container. Do not put them in a trash can. If you do not have a sharps container, call your pharmacist or healthcare provider to get one. A special MedGuide will be given to you by the pharmacist with each prescription and refill. Be sure to read this information carefully each time. Talk to your pediatrician regarding the use of this medicine in children. While this medicine may be prescribed for children as young as 8 years for selected conditions, precautions do apply. Overdosage: If you think you have taken too much of this medicine contact a poison control center or emergency room at once. NOTE: This  medicine is only for you. Do not share this medicine with others. What if I miss a dose? If you miss a dose, take it as soon as you can. If it is almost time for your next dose, take only that dose. Do not take double or extra doses. What may interact with this medicine? Do not take this medicine with any of the following medications: -chasteberry This medicine may also interact with the following medications: -herbal or dietary supplements, like black cohosh or DHEA -male hormones, like estrogens or progestins and birth control pills, patches, rings, or injections -male hormones, like testosterone This list may not describe all possible interactions. Give your health care provider a list of all the medicines, herbs, non-prescription drugs, or dietary supplements you use. Also tell them if you smoke, drink alcohol, or use illegal drugs. Some items may interact with your medicine. What should I watch for while using this medicine? Visit your doctor or health care professional for regular checks on your progress. During the first week, your symptoms may get worse, but then will improve as you continue your treatment. You may get hot flashes, increased bone pain, increased difficulty passing urine, or an aggravation of nerve symptoms. Discuss these effects with your doctor or health care professional, some of them may improve with continued use of this medicine. Male patients may experience a menstrual cycle or spotting during the first 2 months of therapy with this medicine. If this continues, contact your doctor or health care professional. What side effects may I notice from receiving this medicine? Side effects that you should report to your doctor or  health care professional as soon as possible: -allergic reactions like skin rash, itching or hives, swelling of the face, lips, or tongue -breathing problems -chest pain -depression or memory disorders -pain in your legs or groin -pain at site  where injected -severe headache -swelling of the feet and legs -visual changes -vomiting Side effects that usually do not require medical attention (report to your doctor or health care professional if they continue or are bothersome): -breast swelling or tenderness -decrease in sex drive or performance -diarrhea -hot flashes -loss of appetite -muscle, joint, or bone pains -nausea -redness or irritation at site where injected -skin problems or acne This list may not describe all possible side effects. Call your doctor for medical advice about side effects. You may report side effects to FDA at 1-800-FDA-1088. Where should I keep my medicine? Keep out of the reach of children. Store below 25 degrees C (77 degrees F). Do not freeze. Protect from light. Do not use if it is not clear or if there are particles present. Throw away any unused medicine after the expiration date. NOTE: This sheet is a summary. It may not cover all possible information. If you have questions about this medicine, talk to your doctor, pharmacist, or health care provider.  2019 Elsevier/Gold Standard (2015-11-25 10:54:35)

## 2018-05-10 NOTE — Progress Notes (Signed)
05/10/2018 2:01 PM   Carlos Barrera [birth year] [id number]  Referring provider: Noreene Filbert, MD [facility]   [address]   [place], [place] [address]  Chief Complaint  Patient presents with  . Prostate Cancer    HPI: 78 year old male referred by the [facility] to Dr. Baruch Gouty in September 2019 for clinical T2 high risk prostate cancer.  He received a 31-month Lupron injection at the [place] on 12/30/2017 and has completed IMRT.  He was referred here to continue ADT for a 1.5-2 years.  He states he tolerated his initial Lupron without problems.  He has no complaints today.   PMH: Past Medical History:  Diagnosis Date  . COPD (chronic obstructive pulmonary disease) ([place])   . Hypertension   . Prostate cancer ([place])   . Thyroid disease     Surgical History: Past Surgical History:  Procedure Laterality Date  . BACK SURGERY    . HERNIA REPAIR    . INCISE AND DRAIN ABCESS    . NECK SURGERY      Home Medications:  Allergies as of 05/10/2018      Reactions   Feldene [piroxicam] Hives      Medication List       Accurate as of May 10, 2018  2:01 PM. Always use your most recent med list.        aspirin EC 81 MG tablet Take 81 mg by mouth daily.   cetirizine 10 MG tablet Commonly known as:  ZYRTEC   ibuprofen 800 MG tablet Commonly known as:  ADVIL,MOTRIN Take 1 tablet (800 mg total) by mouth every 8 (eight) hours as needed for moderate pain.   levothyroxine 137 MCG tablet Commonly known as:  SYNTHROID, LEVOTHROID Take 137 mcg by mouth daily before breakfast.   lisinopril 10 MG tablet Commonly known as:  PRINIVIL,ZESTRIL Take 10 mg by mouth daily.   MAPAP 325 MG tablet Generic drug:  acetaminophen   Meloxicam 15 MG Tbdp Take by mouth daily as needed.   pravastatin 20 MG tablet Commonly known as:  PRAVACHOL Take 20 mg by mouth at bedtime.   prazosin 5 MG capsule Commonly known as:  MINIPRESS   ranitidine 150 MG tablet Commonly known as:   ZANTAC Take 150 mg by mouth at bedtime.   REFRESH TEARS 0.5 % Soln Generic drug:  carboxymethylcellulose   sertraline 100 MG tablet Commonly known as:  ZOLOFT Take 100 mg by mouth at bedtime.   SYMBICORT 160-4.5 MCG/ACT inhaler Generic drug:  budesonide-formoterol   tiotropium 18 MCG inhalation capsule Commonly known as:  SPIRIVA Place 18 mcg into inhaler and inhale every evening.       Allergies:  Allergies  Allergen Reactions  . Feldene [Piroxicam] Hives    Family History: No family history on file.  Social History:  reports that he quit smoking about 39 years ago. His smoking use included cigarettes. He has never used smokeless tobacco. He reports that he does not drink alcohol or use drugs.  ROS: UROLOGY Frequent Urination?: Yes Hard to postpone urination?: Yes Burning/pain with urination?: No Get up at night to urinate?: Yes Leakage of urine?: No Urine stream starts and stops?: No Trouble starting stream?: No Do you have to strain to urinate?: No Blood in urine?: No Urinary tract infection?: No Sexually transmitted disease?: No Injury to kidneys or bladder?: No Painful intercourse?: No Weak stream?: Yes Erection problems?: Yes Penile pain?: No  Gastrointestinal Nausea?: No Vomiting?: No Indigestion/heartburn?: No Diarrhea?: No  Constipation?: No  Constitutional Fever: No Night sweats?: No Weight loss?: No Fatigue?: No  Skin Skin rash/lesions?: No Itching?: No  Eyes Blurred vision?: No Double vision?: No  Ears/Nose/Throat Sore throat?: No Sinus problems?: No  Hematologic/Lymphatic Swollen glands?: No Easy bruising?: No  Cardiovascular Leg swelling?: No Chest pain?: No  Respiratory Cough?: No Shortness of breath?: Yes  Endocrine Excessive thirst?: No  Musculoskeletal Back pain?: No Joint pain?: Yes  Neurological Headaches?: No Dizziness?: No  Psychologic Depression?: No Anxiety?: No  Physical Exam: BP 106/65 (BP  Location: Left Arm, Patient Position: Sitting, Cuff Size: Normal)   Pulse 85   Ht 5\' 7"  (1.702 m)   Wt 176 lb 3.2 oz (79.9 kg)   BMI 27.60 kg/m   Constitutional:  Alert and oriented, No acute distress.    Assessment & Plan:    1. Prostate cancer [facility]) 78 year old male with T2 high risk prostate cancer who has completed IMRT.  He will continue ADT for 1.5-2 years.  He received a Lupron injection today and will follow-up in 6 months.  - Leuprolide Acetate (6 Month) (LUPRON) injection 45 mg   Return in about 6 months (around 11/08/2018) for Lupron Injection.  Abbie Sons, [place] [address] [place], [place] [address] [phone]

## 2018-07-08 ENCOUNTER — Other Ambulatory Visit: Payer: Self-pay

## 2018-07-11 ENCOUNTER — Inpatient Hospital Stay: Payer: No Typology Code available for payment source | Attending: Radiation Oncology

## 2018-07-11 ENCOUNTER — Other Ambulatory Visit: Payer: Self-pay

## 2018-07-11 DIAGNOSIS — C61 Malignant neoplasm of prostate: Secondary | ICD-10-CM | POA: Insufficient documentation

## 2018-07-11 LAB — PSA: Prostatic Specific Antigen: 0.01 ng/mL (ref 0.00–4.00)

## 2018-07-15 ENCOUNTER — Other Ambulatory Visit: Payer: Self-pay

## 2018-07-18 ENCOUNTER — Encounter: Payer: Self-pay | Admitting: *Deleted

## 2018-07-18 ENCOUNTER — Encounter: Payer: Self-pay | Admitting: Radiation Oncology

## 2018-07-18 ENCOUNTER — Other Ambulatory Visit: Payer: Self-pay

## 2018-07-18 ENCOUNTER — Ambulatory Visit
Admission: RE | Admit: 2018-07-18 | Discharge: 2018-07-18 | Disposition: A | Discharge status: Home / Self Care | Payer: Medicare Other | Source: Ambulatory Visit | Attending: Radiation Oncology | Admitting: Radiation Oncology

## 2018-07-18 ENCOUNTER — Other Ambulatory Visit: Payer: Self-pay | Admitting: *Deleted

## 2018-07-18 VITALS — BP 137/76 | HR 73 | Temp 95.7°F | Resp 18 | Wt 179.0 lb

## 2018-07-18 DIAGNOSIS — Z923 Personal history of irradiation: Secondary | ICD-10-CM | POA: Insufficient documentation

## 2018-07-18 DIAGNOSIS — C61 Malignant neoplasm of prostate: Secondary | ICD-10-CM | POA: Diagnosis not present

## 2018-07-18 NOTE — Progress Notes (Signed)
Radiation Oncology Follow up Note  Name: Carlos Barrera   Date:   07/18/2018 MRN:  [id number] DOB: Jun 02, 1940    This 78 y.o. male presents to the clinic today for four-month follow-up status post IM RT radiation therapy to his prostate and pelvic nodes for Gleason 9 (4+5) adenocarcinoma presenting the PSA of 7.6.  REFERRING PROVIDER: No ref. provider found  HPI: patient is a 78 year old male now out 4 months having completed I MRT radiation therapy to both his prostate and pelvic nodes for Gleason 9 (4+5) adenocarcinoma prostate presenting the PSA of 7.6. Seen today in routine follow-up he is doing well. He specifically denies increased lower urinary tract symptoms or diarrhea does have some occasional loose stools does not use Imodium on a regular basis or follow low residue diet. His most recent PSA is less than 0.01. He is currently on androgen deprivation therapy..  COMPLICATIONS OF TREATMENT: none  FOLLOW UP COMPLIANCE: keeps appointments   PHYSICAL EXAM:  BP 137/76 (BP Location: Left Arm, Patient Position: Sitting)   Pulse 73   Temp (!) 95.7 F (35.4 C) (Tympanic)   Resp 18   Wt 179 lb 0.2 oz (81.2 kg)   BMI 28.04 kg/m  Well-developed well-nourished patient in NAD. HEENT reveals PERLA, EOMI, discs not visualized.  Oral cavity is clear. No oral mucosal lesions are identified. Neck is clear without evidence of cervical or supraclavicular adenopathy. Lungs are clear to A&P. Cardiac examination is essentially unremarkable with regular rate and rhythm without murmur rub or thrill. Abdomen is benign with no organomegaly or masses noted. Motor sensory and DTR levels are equal and symmetric in the upper and lower extremities. Cranial nerves II through XII are grossly intact. Proprioception is intact. No peripheral adenopathy or edema is identified. No motor or sensory levels are noted. Crude visual fields are within normal range.  RADIOLOGY RESULTS: no current films for review  PLAN:  present time patient is under excellent biochemical control of his prostate cancer. I'm please was overall progress. I've asked to see him back in 6 months for follow-up with a PSA prior to that visit. Patient is to call sooner with any concerns.  I would like to take this opportunity to thank you for allowing me to participate in the care of your patient.Noreene Filbert, MD

## 2018-10-11 ENCOUNTER — Encounter (HOSPITAL_COMMUNITY): Payer: Self-pay

## 2018-10-11 ENCOUNTER — Emergency Department (HOSPITAL_COMMUNITY)
Admission: EM | Admit: 2018-10-11 | Discharge: 2018-10-11 | Disposition: A | Discharge status: Home / Self Care | Payer: Medicare Other | Attending: Emergency Medicine | Admitting: Emergency Medicine

## 2018-10-11 DIAGNOSIS — Y998 Other external cause status: Secondary | ICD-10-CM | POA: Insufficient documentation

## 2018-10-11 DIAGNOSIS — Z23 Encounter for immunization: Secondary | ICD-10-CM | POA: Insufficient documentation

## 2018-10-11 DIAGNOSIS — Y9389 Activity, other specified: Secondary | ICD-10-CM | POA: Diagnosis not present

## 2018-10-11 DIAGNOSIS — S61211A Laceration without foreign body of left index finger without damage to nail, initial encounter: Secondary | ICD-10-CM | POA: Diagnosis not present

## 2018-10-11 DIAGNOSIS — Z8546 Personal history of malignant neoplasm of prostate: Secondary | ICD-10-CM | POA: Insufficient documentation

## 2018-10-11 DIAGNOSIS — Z87891 Personal history of nicotine dependence: Secondary | ICD-10-CM | POA: Diagnosis not present

## 2018-10-11 DIAGNOSIS — J449 Chronic obstructive pulmonary disease, unspecified: Secondary | ICD-10-CM | POA: Diagnosis not present

## 2018-10-11 DIAGNOSIS — Z79899 Other long term (current) drug therapy: Secondary | ICD-10-CM | POA: Insufficient documentation

## 2018-10-11 DIAGNOSIS — Y929 Unspecified place or not applicable: Secondary | ICD-10-CM | POA: Insufficient documentation

## 2018-10-11 DIAGNOSIS — Z7982 Long term (current) use of aspirin: Secondary | ICD-10-CM | POA: Insufficient documentation

## 2018-10-11 DIAGNOSIS — I10 Essential (primary) hypertension: Secondary | ICD-10-CM | POA: Diagnosis not present

## 2018-10-11 DIAGNOSIS — W268XXA Contact with other sharp object(s), not elsewhere classified, initial encounter: Secondary | ICD-10-CM | POA: Insufficient documentation

## 2018-10-11 MED ORDER — CEPHALEXIN 500 MG PO CAPS: 500.0000 mg | ORAL_CAPSULE | Freq: Two times a day (BID) | ORAL | 0 refills | Status: DC

## 2018-10-11 MED ORDER — LIDOCAINE HCL 2 % IJ SOLN
10.0000 mL | Freq: Once | INTRAMUSCULAR | Status: AC
  Administered 2018-10-11: 200 mg
  Filled 2018-10-11: qty 20

## 2018-10-11 MED ORDER — BACITRACIN ZINC 500 UNIT/GM EX OINT
TOPICAL_OINTMENT | Freq: Once | CUTANEOUS | Status: AC
  Administered 2018-10-11: 22:00:00 via TOPICAL
  Filled 2018-10-11: qty 0.9

## 2018-10-11 MED ORDER — TETANUS-DIPHTH-ACELL PERTUSSIS 5-2.5-18.5 LF-MCG/0.5 IM SUSP
0.5000 mL | Freq: Once | INTRAMUSCULAR | Status: AC
  Administered 2018-10-11: 0.5 mL via INTRAMUSCULAR
  Filled 2018-10-11: qty 0.5

## 2018-10-11 NOTE — ED Notes (Signed)
Bed: WTR7 Expected date:  Expected time:  Means of arrival:  Comments: 

## 2018-10-11 NOTE — Discharge Instructions (Signed)
It was my pleasure taking care of you today!   Keep wound clean with mild soap and water. Tylenol for pain relief. Your stitches are dissolvable. If for some reason they do not dissolve or fall out, you can see your regular doctor, urgent care or return to ER for suture removal. Monitor area for signs of infection to include, but not limited to: increasing pain, spreading redness, drainage/pus, worsening swelling, or fevers. Return to emergency department for emergent changing or worsening symptoms.   WOUND CARE Keep area clean and dry for 24 hours. Do not remove bandage, if applied. After 24 hours,you should change it at least once a day. Also, change the dressing if it becomes wet or dirty, or as directed by your caregiver.  Wash the wound with soap and water 2 times a day. Rinse the wound off with water to remove all soap. Pat the wound dry with a clean towel.  You may shower as usual after the first 24 hours. Do not soak the wound in water until the sutures are removed.  Return if you experience any of the following signs of infection: Swelling, redness, pus drainage, streaking, fever >100.4 F Return if you experience excessive bleeding that does not stop after 15-20 minutes of constant, firm pressure.

## 2018-10-11 NOTE — ED Provider Notes (Signed)
[facility] DEPT Provider Note   CSN: [id number] Arrival date & time: 10/11/18  1816    History   Chief Complaint Chief Complaint  Patient presents with  . finger laceration    HPI Carlos Barrera is a 78 y.o. male.     The history is provided by the patient and medical records. No language interpreter was used.     Carlos Barrera is a 78 y.o. male  with a PMH as listed below who presents to the Emergency Department for evaluation of laceration to the left index finger.  Patient states that he was working with a metal grinder when the grinder slipped and cut his index finger.  It was bleeding, therefore he put a washcloth on it and the bleeding is now controlled.  Describes a throbbing pain.  No numbness or weakness.  Unsure of his last tetanus vaccine.  Past Medical History:  Diagnosis Date  . COPD (chronic obstructive pulmonary disease) ([place])   . Hypertension   . Prostate cancer ([place])   . Thyroid disease     There are no active problems to display for this patient.   Past Surgical History:  Procedure Laterality Date  . BACK SURGERY    . HERNIA REPAIR    . INCISE AND DRAIN ABCESS    . NECK SURGERY          Home Medications    Prior to Admission medications   Medication Sig Start Date End Date Taking? Authorizing Provider  aspirin EC 81 MG tablet Take 81 mg by mouth daily.    [provider]  cephALEXin (KEFLEX) 500 MG capsule Take 1 capsule (500 mg total) by mouth 2 (two) times daily. 10/11/18   Rufus Beske, Ozella Almond, PA-C  cetirizine (ZYRTEC) 10 MG tablet  02/13/18   [provider]  cholecalciferol (VITAMIN D) 25 MCG (1000 UT) tablet  05/24/18   [provider]  ibuprofen (ADVIL,MOTRIN) 800 MG tablet Take 1 tablet (800 mg total) by mouth every 8 (eight) hours as needed for moderate pain. 06/08/14   Everlene Balls, MD  levothyroxine (SYNTHROID, LEVOTHROID) 137 MCG tablet Take 137 mcg by mouth daily before  breakfast.    [provider]  lisinopril (PRINIVIL,ZESTRIL) 10 MG tablet Take 10 mg by mouth daily.    [provider]  MAPAP 325 MG tablet  02/11/18   [provider]  Meloxicam 15 MG TBDP Take by mouth daily as needed.    [provider]  pravastatin (PRAVACHOL) 20 MG tablet Take 20 mg by mouth at bedtime.    [provider]  prazosin (MINIPRESS) 5 MG capsule  02/21/18   [provider]  ranitidine (ZANTAC) 150 MG tablet Take 150 mg by mouth at bedtime.    [provider]  REFRESH TEARS 0.5 % SOLN  02/13/18   [provider]  sertraline (ZOLOFT) 100 MG tablet Take 100 mg by mouth at bedtime.    [provider]  [facility] 160-4.5 MCG/ACT inhaler  04/30/18   [provider]  tiotropium (SPIRIVA) 18 MCG inhalation capsule Place 18 mcg into inhaler and inhale every evening.    [provider]    Family History No family history on file.  Social History Social History   Tobacco Use  . Smoking status: Former Smoker    Types: Cigarettes    Quit date: 04/21/1979    Years since quitting: 39.5  . Smokeless tobacco: Never Used  Substance Use  Topics  . Alcohol use: No  . Drug use: No     Allergies   Feldene [piroxicam]   Review of Systems Review of Systems  Musculoskeletal: Positive for myalgias.  Skin: Positive for wound.  Neurological: Negative for weakness and numbness.     Physical Exam Updated Vital Signs BP 116/69 (BP Location: Left Arm)   Pulse 76   Temp 98.4 F (36.9 C) (Oral)   Resp 19   SpO2 94%   Physical Exam Vitals signs and nursing note reviewed.  Constitutional:      General: He is not in acute distress.    Appearance: He is well-developed.  HENT:     Head: Normocephalic and atraumatic.  Neck:     Musculoskeletal: Neck supple.  Cardiovascular:     Rate and Rhythm: Normal rate and regular rhythm.     Heart sounds: Normal heart sounds. No murmur.   Pulmonary:     Effort: Pulmonary effort is normal. No respiratory distress.     Breath sounds: Normal breath sounds. No wheezing or rales.  Musculoskeletal: Normal range of motion.     Comments: 1.5 cm laceration to finger pad of left index finger. Good cap refill. Sensation intact. Good cap refill.   Skin:    General: Skin is warm and dry.  Neurological:     Mental Status: He is alert.      ED Treatments / Results  Labs (all labs ordered are listed, but only abnormal results are displayed) Labs Reviewed - No data to display  EKG None  Radiology No results found.  Procedures .Marland KitchenLaceration Repair  Date/Time: 10/11/2018 9:19 PM Performed by: Anet Logsdon, Ozella Almond, PA-C Authorized by: Tavyn Kurka, Ozella Almond, PA-C   Consent:    Consent obtained:  Verbal   Consent given by:  Patient   Risks discussed:  Pain, infection, poor cosmetic result and poor wound healing Anesthesia (see MAR for exact dosages):    Anesthesia method:  Local infiltration   Local anesthetic:  Lidocaine 2% w/o epi Laceration details:    Location:  Finger   Finger location:  L index finger   Length (cm):  1.5 Repair type:    Repair type:  Simple Pre-procedure details:    Preparation:  Patient was prepped and draped in usual sterile fashion Exploration:    Hemostasis achieved with:  Direct pressure   Wound exploration: entire depth of wound probed and visualized   Treatment:    Area cleansed with:  Saline and Betadine   Amount of cleaning:  Standard   Irrigation solution:  Sterile saline Skin repair:    Repair method:  Sutures   Suture size:  5-0   Wound skin closure material used: Vicryl rapide.   Suture technique:  Simple interrupted   Number of sutures:  2 Approximation:    Approximation:  Close Post-procedure details:    Dressing:  Antibiotic ointment   Patient tolerance of procedure:  Tolerated well, no immediate complications   (including critical care time)  Medications Ordered in ED  Medications  bacitracin ointment (has no administration in time range)  lidocaine (XYLOCAINE) 2 % (with pres) injection 200 mg (200 mg Infiltration Given by Other 10/11/18 2008)  Tdap (BOOSTRIX) injection 0.5 mL (0.5 mLs Intramuscular Given 10/11/18 2008)     Initial Impression / Assessment and Plan / ED Course  I have reviewed the triage vital signs and the nursing notes.  Pertinent labs & imaging results that were available during my care of the patient  were reviewed by me and considered in my medical decision making (see chart for details).       GROVE DEFINA is a 78 y.o. male who presents to ED for laceration of index finger. Wound thoroughly cleaned in ED today. Wound explored and bottom of wound seen in a bloodless field. NVI on exam. Laceration repaired as dictated above. Patient counseled on home wound care. Will start on Keflex for ABX prevention. Patient was urged to return to the Emergency Department for worsening pain, swelling, expanding erythema especially if it streaks away from the affected area, fever, or for any additional concerns. Patient verbalized understanding. All questions answered.   Final Clinical Impressions(s) / ED Diagnoses   Final diagnoses:  Laceration of left index finger without foreign body without damage to nail, initial encounter    ED Discharge Orders         Ordered    cephALEXin (KEFLEX) 500 MG capsule  2 times daily     10/11/18 2116           Capucine Tryon, Ozella Almond, PA-C 10/11/18 2121    Gareth Morgan, MD 10/13/18 [phone]

## 2018-10-11 NOTE — ED Triage Notes (Signed)
patietn arrived via [facility] from home.   Patient was cutting sheet metal and sliced his left index finger on left hand.    Bleeding controlled when EMS. Finger intact. Bandaged by ems.    A/ox4 Ambulatory in triage.

## 2018-11-09 ENCOUNTER — Ambulatory Visit: Payer: Medicare Other | Admitting: Urology

## 2018-11-16 ENCOUNTER — Telehealth: Payer: Self-pay | Admitting: Urology

## 2018-11-16 NOTE — Telephone Encounter (Signed)
Pt called back and I read him the note, he voiced understanding.

## 2018-11-16 NOTE — Telephone Encounter (Signed)
LM for pt to cb to reschd an app for his 6 month follow up and Lupron Injection. Canceled due tot he Lupron shortage. It was for 11-22-18 we will call him once we get more Lupron/   Sharyn Lull

## 2018-11-22 ENCOUNTER — Ambulatory Visit: Payer: Medicare Other | Admitting: Urology

## 2018-11-24 ENCOUNTER — Other Ambulatory Visit: Payer: Self-pay

## 2018-11-24 ENCOUNTER — Ambulatory Visit (INDEPENDENT_AMBULATORY_CARE_PROVIDER_SITE_OTHER): Payer: Medicare Other

## 2018-11-24 DIAGNOSIS — C61 Malignant neoplasm of prostate: Secondary | ICD-10-CM | POA: Diagnosis not present

## 2018-11-24 MED ORDER — LEUPROLIDE ACETATE (6 MONTH) 45 MG IM KIT: 45.0000 mg | PACK | Freq: Once | INTRAMUSCULAR | Status: AC

## 2018-11-24 NOTE — Progress Notes (Signed)
Lupron IM Injection   Due to Prostate Cancer patient is present today for a Lupron Injection.  Medication: Lupron 6 month Dose: 45 mg  Location: left upper outer buttocks Lot: [id number] Exp: 11/29/2020  Patient tolerated well, no complications were noted  Performed by: Fonnie Jarvis, CMA  Follow up: patient

## 2018-12-06 ENCOUNTER — Other Ambulatory Visit: Payer: Self-pay

## 2018-12-06 ENCOUNTER — Encounter: Payer: Self-pay | Admitting: *Deleted

## 2018-12-06 ENCOUNTER — Encounter: Payer: No Typology Code available for payment source | Attending: Internal Medicine | Admitting: *Deleted

## 2018-12-06 DIAGNOSIS — R0600 Dyspnea, unspecified: Secondary | ICD-10-CM | POA: Insufficient documentation

## 2018-12-06 NOTE — Progress Notes (Signed)
Virtual orientation completed

## 2018-12-07 ENCOUNTER — Other Ambulatory Visit: Payer: Self-pay

## 2018-12-07 VITALS — Ht 67.75 in | Wt 178.8 lb

## 2018-12-07 DIAGNOSIS — R0600 Dyspnea, unspecified: Secondary | ICD-10-CM

## 2018-12-07 NOTE — Patient Instructions (Signed)
Patient Instructions  Patient Details  Name: Carlos Barrera MRN: [id number] Date of Birth: 1941/01/06 Referring Provider:  Gerome Sam*  Below are your personal goals for exercise, nutrition, and risk factors. Our goal is to help you stay on track towards obtaining and maintaining these goals. We will be discussing your progress on these goals with you throughout the program.  Initial Exercise Prescription: Initial Exercise Prescription - 12/07/18 1300      Date of Initial Exercise RX and Referring Provider   Date  12/07/18    Referring Provider  [name]      Treadmill   MPH  1    Grade  0    Minutes  15   1 min then rest   METs  1.2      NuStep   Level  1    SPM  80    Minutes  15    METs  1.2      Arm Ergometer   Level  1    RPM  50    Minutes  15    METs  1.2      Biostep-RELP   Level  1    SPM  50    Minutes  15    METs  2      Prescription Details   Frequency (times per week)  2    Duration  Progress to 30 minutes of continuous aerobic without signs/symptoms of physical distress      Intensity   THRR 40-80% of Max Heartrate  104-130    Ratings of Perceived Exertion  11-13    Perceived Dyspnea  0-4      Progression   Progression  Continue to progress workloads to maintain intensity without signs/symptoms of physical distress.      Resistance Training   Training Prescription  Yes    Weight  3 lb    Reps  10-15       Exercise Goals: Frequency: Be able to perform aerobic exercise two to three times per week in program working toward 2-5 days per week of home exercise.  Intensity: Work with a perceived exertion of 11 (fairly light) - 15 (hard) while following your exercise prescription.  We will make changes to your prescription with you as you progress through the program.   Duration: Be able to do 30 to 45 minutes of continuous aerobic exercise in addition to a 5 minute warm-up and a 5 minute cool-down routine.   Nutrition  Goals: Your personal nutrition goals will be established when you do your nutrition analysis with the dietician.  The following are general nutrition guidelines to follow: Cholesterol < 200mg /day Sodium < 1500mg /day Fiber: Men over 50 yrs - 30 grams per day  Personal Goals: Personal Goals and Risk Factors at Admission - 12/07/18 1311      Core Components/Risk Factors/Patient Goals on Admission    Weight Management  Yes;Weight Maintenance    Intervention  Weight Management: Develop a combined nutrition and exercise program designed to reach desired caloric intake, while maintaining appropriate intake of nutrient and fiber, sodium and fats, and appropriate energy expenditure required for the weight goal.;Weight Management: Provide education and appropriate resources to help participant work on and attain dietary goals.    Admit Weight  178 lb 12.8 oz (81.1 kg)    Goal Weight: Short Term  168 lb (76.2 kg)    Goal Weight: Long Term  158 lb (71.7 kg)    Expected Outcomes  Short Term: Continue to assess and modify interventions until short term weight is achieved;Long Term: Adherence to nutrition and physical activity/exercise program aimed toward attainment of established weight goal    Intervention  Provide education on lifestyle modifcations including regular physical activity/exercise, weight management, moderate sodium restriction and increased consumption of fresh fruit, vegetables, and low fat dairy, alcohol moderation, and smoking cessation.;Monitor prescription use compliance.    Expected Outcomes  Short Term: Continued assessment and intervention until BP is < 140/50mm HG in hypertensive participants. < 130/8mm HG in hypertensive participants with diabetes, heart failure or chronic kidney disease.;Long Term: Maintenance of blood pressure at goal levels.    Intervention  Provide education and support for participant on nutrition & aerobic/resistive exercise along with prescribed medications  to achieve LDL 70mg , HDL >40mg .    Expected Outcomes  Short Term: Participant states understanding of desired cholesterol values and is compliant with medications prescribed. Participant is following exercise prescription and nutrition guidelines.;Long Term: Cholesterol controlled with medications as prescribed, with individualized exercise RX and with personalized nutrition plan. Value goals: LDL < 70mg , HDL > 40 mg.       Tobacco Use Initial Evaluation: Social History   Tobacco Use  Smoking Status Former Smoker  . Packs/day: 2.00  . Years: 20.00  . Pack years: 40.00  . Types: Cigarettes  . Quit date: 04/20/1980  . Years since quitting: 38.6  Smokeless Tobacco Never Used    Exercise Goals and Review: Exercise Goals    Row Name 12/07/18 1309             Exercise Goals   Increase Physical Activity  Yes       Intervention  Provide advice, education, support and counseling about physical activity/exercise needs.;Develop an individualized exercise prescription for aerobic and resistive training based on initial evaluation findings, risk stratification, comorbidities and participant's personal goals.       Expected Outcomes  Short Term: Attend rehab on a regular basis to increase amount of physical activity.;Long Term: Add in home exercise to make exercise part of routine and to increase amount of physical activity.;Long Term: Exercising regularly at least 3-5 days a week.       Increase Strength and Stamina  Yes       Intervention  Provide advice, education, support and counseling about physical activity/exercise needs.;Develop an individualized exercise prescription for aerobic and resistive training based on initial evaluation findings, risk stratification, comorbidities and participant's personal goals.       Expected Outcomes  Short Term: Increase workloads from initial exercise prescription for resistance, speed, and METs.;Short Term: Perform resistance training exercises routinely  during rehab and add in resistance training at home;Long Term: Improve cardiorespiratory fitness, muscular endurance and strength as measured by increased METs and functional capacity (6MWT)       Able to understand and use rate of perceived exertion (RPE) scale  Yes       Intervention  Provide education and explanation on how to use RPE scale       Expected Outcomes  Short Term: Able to use RPE daily in rehab to express subjective intensity level;Long Term:  Able to use RPE to guide intensity level when exercising independently       Able to understand and use Dyspnea scale  Yes       Intervention  Provide education and explanation on how to use Dyspnea scale       Expected Outcomes  Short Term: Able to use Dyspnea scale daily  in rehab to express subjective sense of shortness of breath during exertion;Long Term: Able to use Dyspnea scale to guide intensity level when exercising independently       Knowledge and understanding of Target Heart Rate Range (THRR)  Yes       Intervention  Provide education and explanation of THRR including how the numbers were predicted and where they are located for reference       Expected Outcomes  Short Term: Able to state/look up THRR;Short Term: Able to use daily as guideline for intensity in rehab;Long Term: Able to use THRR to govern intensity when exercising independently       Able to check pulse independently  Yes       Intervention  Provide education and demonstration on how to check pulse in carotid and radial arteries.;Review the importance of being able to check your own pulse for safety during independent exercise       Expected Outcomes  Short Term: Able to explain why pulse checking is important during independent exercise;Long Term: Able to check pulse independently and accurately       Understanding of Exercise Prescription  Yes       Intervention  Provide education, explanation, and written materials on patient's individual exercise prescription        Expected Outcomes  Short Term: Able to explain program exercise prescription;Long Term: Able to explain home exercise prescription to exercise independently          Copy of goals given to participant.

## 2018-12-07 NOTE — Progress Notes (Signed)
Pulmonary Individual Treatment Plan  Patient Details  Name: Carlos Barrera MRN: [id number] Date of Birth: June 28, 1940 Referring Provider:     Pulmonary Rehab from 12/07/2018 in [facility] Cardiac and Pulmonary Rehab  Referring Provider  [name]      Initial Encounter Date:    Pulmonary Rehab from 12/07/2018 in [facility] Cardiac and Pulmonary Rehab  Date  12/07/18      Visit Diagnosis: Dyspnea, unspecified type  Patient's Home Medications on Admission:  Current Outpatient Medications:  .  albuterol (VENTOLIN HFA) 108 (90 Base) MCG/ACT inhaler, Inhale 2 puffs into the lungs every 6 (six) hours as needed for wheezing or shortness of breath., Disp: , Rfl:  .  aspirin EC 81 MG tablet, Take 81 mg by mouth daily., Disp: , Rfl:  .  Calcium Carbonate-Vitamin D 600-400 MG-UNIT tablet, , Disp: , Rfl:  .  cephALEXin (KEFLEX) 500 MG capsule, Take 1 capsule (500 mg total) by mouth 2 (two) times daily. (Patient not taking: Reported on 12/06/2018), Disp: 10 capsule, Rfl: 0 .  cetirizine (ZYRTEC) 10 MG tablet, , Disp: , Rfl:  .  cholecalciferol (VITAMIN D) 25 MCG (1000 UT) tablet, , Disp: , Rfl:  .  ibuprofen (ADVIL,MOTRIN) 800 MG tablet, Take 1 tablet (800 mg total) by mouth every 8 (eight) hours as needed for moderate pain. (Patient not taking: Reported on 12/06/2018), Disp: 15 tablet, Rfl: 0 .  levothyroxine (SYNTHROID, LEVOTHROID) 137 MCG tablet, Take 137 mcg by mouth daily before breakfast., Disp: , Rfl:  .  lisinopril (PRINIVIL,ZESTRIL) 10 MG tablet, Take 10 mg by mouth daily., Disp: , Rfl:  .  MAPAP 325 MG tablet, , Disp: , Rfl:  .  Meloxicam 15 MG TBDP, Take by mouth daily as needed., Disp: , Rfl:  .  pravastatin (PRAVACHOL) 20 MG tablet, Take 20 mg by mouth at bedtime., Disp: , Rfl:  .  prazosin (MINIPRESS) 5 MG capsule, , Disp: , Rfl:  .  ranitidine (ZANTAC) 150 MG tablet, Take 150 mg by mouth at bedtime., Disp: , Rfl:  .  REFRESH TEARS 0.5 % SOLN, , Disp: , Rfl:  .  sertraline (ZOLOFT) 100 MG  tablet, Take 100 mg by mouth at bedtime., Disp: , Rfl:  .  SYMBICORT 160-4.5 MCG/ACT inhaler, , Disp: , Rfl:  .  tiotropium (SPIRIVA) 18 MCG inhalation capsule, Place 18 mcg into inhaler and inhale every evening., Disp: , Rfl:   Current Facility-Administered Medications:  .  Leuprolide Acetate (6 Month) (LUPRON) injection 45 mg, 45 mg, Intramuscular, Once, Stoioff, Ronda Fairly, MD  Past Medical History: Past Medical History:  Diagnosis Date  . COPD (chronic obstructive pulmonary disease) ([place])   . Hypertension   . Prostate cancer ([place])   . Thyroid disease     Tobacco Use: Social History   Tobacco Use  Smoking Status Former Smoker  . Packs/day: 2.00  . Years: 20.00  . Pack years: 40.00  . Types: Cigarettes  . Quit date: 04/20/1980  . Years since quitting: 38.6  Smokeless Tobacco Never Used    Labs: Recent Review Flowsheet Data    There is no flowsheet data to display.       Pulmonary Assessment Scores: Pulmonary Assessment Scores    Row Name 12/07/18 1316         ADL UCSD   SOB Score total  62     Rest  1     Walk  2     Stairs  5     Bath  2     Dress  2     Shop  2       CAT Score   CAT Score  29       mMRC Score   mMRC Score  3        UCSD: Self-administered rating of dyspnea associated with activities of daily living (ADLs) 6-point scale (0 = "not at all" to 5 = "maximal or unable to do because of breathlessness")  Scoring Scores range from 0 to 120.  Minimally important difference is 5 units  CAT: CAT can identify the health impairment of COPD patients and is better correlated with disease progression.  CAT has a scoring range of zero to 40. The CAT score is classified into four groups of low (less than 10), medium (10 - 20), high (21-30) and very high (31-40) based on the impact level of disease on health status. A CAT score over 10 suggests significant symptoms.  A worsening CAT score could be explained by an exacerbation, poor medication adherence,  poor inhaler technique, or progression of COPD or comorbid conditions.  CAT MCID is 2 points  mMRC: mMRC (Modified Medical Research Council) Dyspnea Scale is used to assess the degree of baseline functional disability in patients of respiratory disease due to dyspnea. No minimal important difference is established. A decrease in score of 1 point or greater is considered a positive change.   Pulmonary Function Assessment:   Exercise Target Goals: Exercise Program Goal: Individual exercise prescription set using results from initial 6 min walk test and THRR while considering  patient's activity barriers and safety.   Exercise Prescription Goal: Initial exercise prescription builds to 30-45 minutes a day of aerobic activity, 2-3 days per week.  Home exercise guidelines will be given to patient during program as part of exercise prescription that the participant will acknowledge.  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 HR  101 bpm     Max Ex. BP  124/58     2 Minute Post BP  112/58       Interval HR   1 Minute HR  92     2 Minute HR  70 patient holding hands behind back     3 Minute HR  - sig loss     4 Minute HR  60 see previous note - not sure HR accurate     2  Minute Post HR  78     Interval  Heart Rate?  Yes       Interval Oxygen   Interval Oxygen?  Yes     Baseline Oxygen Saturation %  96 %     1 Minute Oxygen Saturation %  96 %     1 Minute Liters of Oxygen  0 L     2 Minute Oxygen Saturation %  95 %     2 Minute Liters of Oxygen  0 L     3 Minute Oxygen Saturation %  96 %     3 Minute Liters of Oxygen  0 L     4 Minute Oxygen Saturation %  95 %     4 Minute Liters of Oxygen  0 L     2 Minute Post Oxygen Saturation %  96 %     2 Minute Post Liters of Oxygen  0 L       Oxygen Initial Assessment: Oxygen Initial Assessment - 12/06/18 1114      Home Oxygen   Home Oxygen Device  None   requested oxygen therapy, consult scheduled   Sleep Oxygen Prescription  None    Home Exercise Oxygen Prescription  None    Home at Rest Exercise Oxygen Prescription  None      Initial 6 min Walk   Oxygen Used  None      Program Oxygen Prescription   Program Oxygen Prescription  None      Intervention   Short Term Goals  To learn and exhibit compliance with exercise, home and travel O2 prescription;To learn and understand importance of monitoring SPO2 with pulse oximeter and demonstrate accurate use of the pulse oximeter.;To learn and understand importance of maintaining oxygen saturations>88%;To learn and demonstrate proper pursed lip breathing techniques or other breathing techniques.;To learn and demonstrate proper use of respiratory medications    Long  Term Goals  Exhibits compliance with exercise, home and travel O2 prescription;Verbalizes importance of monitoring SPO2 with pulse oximeter and return demonstration;Maintenance of O2 saturations>88%;Exhibits proper breathing techniques, such as pursed lip breathing or other method taught during program session;Compliance with respiratory medication;Demonstrates proper use of MDI's       Oxygen Re-Evaluation:   Oxygen Discharge (Final Oxygen Re-Evaluation):   Initial Exercise Prescription: Initial Exercise Prescription -  12/07/18 1300      Date of Initial Exercise RX and Referring Provider   Date  12/07/18    Referring Provider  [name]      Treadmill   MPH  1    Grade  0    Minutes  15   1 min then rest   METs  1.2      NuStep   Level  1    SPM  80    Minutes  15    METs  1.2      Arm Ergometer   Level  1    RPM  50    Minutes  15    METs  1.2      Biostep-RELP   Level  1    SPM  50    Minutes  15    METs  2      Prescription Details   Frequency (times per week)  2    Duration  Progress to 30 minutes of continuous aerobic without signs/symptoms of physical distress      Intensity   THRR 40-80% of Max Heartrate  104-130  Ratings of Perceived Exertion  11-13    Perceived Dyspnea  0-4      Progression   Progression  Continue to progress workloads to maintain intensity without signs/symptoms of physical distress.      Resistance Training   Training Prescription  Yes    Weight  3 lb    Reps  10-15       Perform Capillary Blood Glucose checks as needed.  Exercise Prescription Changes: Exercise Prescription Changes    Row Name 12/07/18 1300             Response to Exercise   Blood Pressure (Admit)  106/48       Blood Pressure (Exercise)  124/58       Blood Pressure (Exit)  112/58       Heart Rate (Admit)  101 bpm       Heart Rate (Exercise)  92 bpm       Heart Rate (Exit)  78 bpm       Oxygen Saturation (Admit)  96 %       Oxygen Saturation (Exercise)  95 %       Oxygen Saturation (Exit)  96 %       Rating of Perceived Exertion (Exercise)  17       Perceived Dyspnea (Exercise)  4       Symptoms  none       Duration  Progress to 30 minutes of  aerobic without signs/symptoms of physical distress         Progression   Progression  Continue to progress workloads to maintain intensity without signs/symptoms of physical distress.          Exercise Comments:   Exercise Goals and Review: Exercise Goals    Row Name 12/07/18 1309             Exercise  Goals   Increase Physical Activity  Yes       Intervention  Provide advice, education, support and counseling about physical activity/exercise needs.;Develop an individualized exercise prescription for aerobic and resistive training based on initial evaluation findings, risk stratification, comorbidities and participant's personal goals.       Expected Outcomes  Short Term: Attend rehab on a regular basis to increase amount of physical activity.;Long Term: Add in home exercise to make exercise part of routine and to increase amount of physical activity.;Long Term: Exercising regularly at least 3-5 days a week.       Increase Strength and Stamina  Yes       Intervention  Provide advice, education, support and counseling about physical activity/exercise needs.;Develop an individualized exercise prescription for aerobic and resistive training based on initial evaluation findings, risk stratification, comorbidities and participant's personal goals.       Expected Outcomes  Short Term: Increase workloads from initial exercise prescription for resistance, speed, and METs.;Short Term: Perform resistance training exercises routinely during rehab and add in resistance training at home;Long Term: Improve cardiorespiratory fitness, muscular endurance and strength as measured by increased METs and functional capacity (6MWT)       Able to understand and use rate of perceived exertion (RPE) scale  Yes       Intervention  Provide education and explanation on how to use RPE scale       Expected Outcomes  Short Term: Able to use RPE daily in rehab to express subjective intensity level;Long Term:  Able to use RPE to guide intensity level when exercising independently  Able to understand and use Dyspnea scale  Yes       Intervention  Provide education and explanation on how to use Dyspnea scale       Expected Outcomes  Short Term: Able to use Dyspnea scale daily in rehab to express subjective sense of shortness of  breath during exertion;Long Term: Able to use Dyspnea scale to guide intensity level when exercising independently       Knowledge and understanding of Target Heart Rate Range (THRR)  Yes       Intervention  Provide education and explanation of THRR including how the numbers were predicted and where they are located for reference       Expected Outcomes  Short Term: Able to state/look up THRR;Short Term: Able to use daily as guideline for intensity in rehab;Long Term: Able to use THRR to govern intensity when exercising independently       Able to check pulse independently  Yes       Intervention  Provide education and demonstration on how to check pulse in carotid and radial arteries.;Review the importance of being able to check your own pulse for safety during independent exercise       Expected Outcomes  Short Term: Able to explain why pulse checking is important during independent exercise;Long Term: Able to check pulse independently and accurately       Understanding of Exercise Prescription  Yes       Intervention  Provide education, explanation, and written materials on patient's individual exercise prescription       Expected Outcomes  Short Term: Able to explain program exercise prescription;Long Term: Able to explain home exercise prescription to exercise independently          Exercise Goals Re-Evaluation :   Discharge Exercise Prescription (Final Exercise Prescription Changes): Exercise Prescription Changes - 12/07/18 1300      Response to Exercise   Blood Pressure (Admit)  106/48    Blood Pressure (Exercise)  124/58    Blood Pressure (Exit)  112/58    Heart Rate (Admit)  101 bpm    Heart Rate (Exercise)  92 bpm    Heart Rate (Exit)  78 bpm    Oxygen Saturation (Admit)  96 %    Oxygen Saturation (Exercise)  95 %    Oxygen Saturation (Exit)  96 %    Rating of Perceived Exertion (Exercise)  17    Perceived Dyspnea (Exercise)  4    Symptoms  none    Duration  Progress to 30  minutes of  aerobic without signs/symptoms of physical distress      Progression   Progression  Continue to progress workloads to maintain intensity without signs/symptoms of physical distress.       Nutrition:  Target Goals: Understanding of nutrition guidelines, daily intake of sodium <1564m, cholesterol <2018m calories 30% from fat and 7% or less from saturated fats, daily to have 5 or more servings of fruits and vegetables.  Biometrics: Pre Biometrics - 12/07/18 1310      Pre Biometrics   Height  5' 7.75" (1.721 m)    Weight  178 lb 12.8 oz (81.1 kg)    BMI (Calculated)  27.38        Nutrition Therapy Plan and Nutrition Goals: Nutrition Therapy & Goals - 12/07/18 1236      Nutrition Therapy   Diet  low Na, HH diet    Protein (specify units)  65-70g    Fiber  30 grams  Whole Grain Foods  3 servings    Saturated Fats  12 max. grams    Fruits and Vegetables  5 servings/day    Sodium  1.5 grams      Personal Nutrition Goals   Nutrition Goal  ST: add protein foods to B (when he has it) and protein to snack after lunch LT: lose 5 lbs and limit SOB    Comments  Pt reports eating breakfast sometimes (OJ, muffin, sometimes yogurt). Takes out [employer] for lunch with veggies and salad and meat - sometimes potatoes. Pt will have small snack of fruit with whipped cream. Discussed maybe adding peanut butter or yogurt to add protein. Disucssed HH, low Na eating and higher needs. Pt reports having a balanced relationship with food.      Intervention Plan   Intervention  Prescribe, educate and counsel regarding individualized specific dietary modifications aiming towards targeted core components such as weight, hypertension, lipid management, diabetes, heart failure and other comorbidities.;Nutrition handout(s) given to patient.    Expected Outcomes  Short Term Goal: Understand basic principles of dietary content, such as calories, fat, sodium, cholesterol and nutrients.;Short  Term Goal: A plan has been developed with personal nutrition goals set during dietitian appointment.;Long Term Goal: Adherence to prescribed nutrition plan.       Nutrition Assessments:   Nutrition Goals Re-Evaluation:   Nutrition Goals Discharge (Final Nutrition Goals Re-Evaluation):   Psychosocial: Target Goals: Acknowledge presence or absence of significant depression and/or stress, maximize coping skills, provide positive support system. Participant is able to verbalize types and ability to use techniques and skills needed for reducing stress and depression.   Initial Review & Psychosocial Screening: Initial Psych Review & Screening - 12/06/18 1117      Initial Review   Current issues with  Current Stress Concerns;Current Anxiety/Panic    Source of Stress Concerns  Poor Coping Skills;Chronic Illness    Comments  PTSD on Zoloft, not currently seeing counselor, does not like being approached from behind, unexpected noises      [place]?  Yes    Comments  Church family, good close neighbors, son lives nearby      Barriers   Psychosocial barriers to participate in program  The patient should benefit from training in stress management and relaxation.;Psychosocial barriers identified (see note)      Screening Interventions   Interventions  Encouraged to exercise;Program counselor consult;To provide support and resources with identified psychosocial needs;Provide feedback about the scores to participant    Expected Outcomes  Short Term goal: Utilizing psychosocial counselor, staff and physician to assist with identification of specific Stressors or current issues interfering with healing process. Setting desired goal for each stressor or current issue identified.;Long Term Goal: Stressors or current issues are controlled or eliminated.;Short Term goal: Identification and review with participant of any Quality of Life or Depression concerns found by scoring  the questionnaire.;Long Term goal: The participant improves quality of Life and PHQ9 Scores as seen by post scores and/or verbalization of changes       Quality of Life Scores:  Scores of 19 and below usually indicate a poorer quality of life in these areas.  A difference of  2-3 points is a clinically meaningful difference.  A difference of 2-3 points in the total score of the Quality of Life Index has been associated with significant improvement in overall quality of life, self-image, physical symptoms, and general health in studies assessing change in quality  of life.  PHQ-9: Recent Review Flowsheet Data    Depression screen [facility] 2/9 12/07/2018   Decreased Interest 0   Down, Depressed, Hopeless 0   PHQ - 2 Score 0   Altered sleeping 3   Tired, decreased energy 2   Change in appetite 1   Feeling bad or failure about yourself  0   Trouble concentrating 0   Moving slowly or fidgety/restless 3   Suicidal thoughts 0   PHQ-9 Score 9   Difficult doing work/chores Somewhat difficult     Interpretation of Total Score  Total Score Depression Severity:  1-4 = Minimal depression, 5-9 = Mild depression, 10-14 = Moderate depression, 15-19 = Moderately severe depression, 20-27 = Severe depression   Psychosocial Evaluation and Intervention:   Psychosocial Re-Evaluation:   Psychosocial Discharge (Final Psychosocial Re-Evaluation):   Education: Education Goals: Education classes will be provided on a weekly basis, covering required topics. Participant will state understanding/return demonstration of topics presented.  Learning Barriers/Preferences: Learning Barriers/Preferences - 12/06/18 1115      Learning Barriers/Preferences   Learning Barriers  Sight;Hearing;Exercise Concerns   wears glasses and hearing aid, wears knee brace   Learning Preferences  Individual Instruction;Written Material       Education Topics:  Initial Evaluation Education: - Verbal, written and  demonstration of respiratory meds, oximetry and breathing techniques. Instruction on use of nebulizers and MDIs and importance of monitoring MDI activations.   General Nutrition Guidelines/Fats and Fiber: -Group instruction provided by verbal, written material, models and posters to present the general guidelines for heart healthy nutrition. Gives an explanation and review of dietary fats and fiber.   Controlling Sodium/Reading Food Labels: -Group verbal and written material supporting the discussion of sodium use in heart healthy nutrition. Review and explanation with models, verbal and written materials for utilization of the food label.   Exercise Physiology & General Exercise Guidelines: - Group verbal and written instruction with models to review the exercise physiology of the cardiovascular system and associated critical values. Provides general exercise guidelines with specific guidelines to those with heart or lung disease.    Aerobic Exercise & Resistance Training: - Gives group verbal and written instruction on the various components of exercise. Focuses on aerobic and resistive training programs and the benefits of this training and how to safely progress through these programs.   Flexibility, Balance, Mind/Body Relaxation: Provides group verbal/written instruction on the benefits of flexibility and balance training, including mind/body exercise modes such as yoga, pilates and tai chi.  Demonstration and skill practice provided.   Stress and Anxiety: - Provides group verbal and written instruction about the health risks of elevated stress and causes of high stress.  Discuss the correlation between heart/lung disease and anxiety and treatment options. Review healthy ways to manage with stress and anxiety.   Depression: - Provides group verbal and written instruction on the correlation between heart/lung disease and depressed mood, treatment options, and the stigmas associated  with seeking treatment.   Exercise & Equipment Safety: - Individual verbal instruction and demonstration of equipment use and safety with use of the equipment.   Pulmonary Rehab from 12/07/2018 in [facility] Cardiac and Pulmonary Rehab  Date  12/07/18  Educator  AS  Instruction Review Code  1- Verbalizes Understanding      Infection Prevention: - Provides verbal and written material to individual with discussion of infection control including proper hand washing and proper equipment cleaning during exercise session.   Pulmonary Rehab from 12/07/2018 in [facility]  Cardiac and Pulmonary Rehab  Date  12/07/18  Educator  AS  Instruction Review Code  1- Verbalizes Understanding      Falls Prevention: - Provides verbal and written material to individual with discussion of falls prevention and safety.   Pulmonary Rehab from 12/07/2018 in [facility] Card[facility] and Pulmonary Rehab  Date  12/07/18  Educator  AS  Instruction Review Code  1- Verbalizes Understanding      Diabetes: - Individual verbal and written instruction to review signs/symptoms of diabetes, desired ranges of glucose level fasting, after meals and with exercise. Advice that pre and post exercise glucose checks will be done for 3 sessions at entry of program.   Chronic Lung Diseases: - Group verbal and written instruction to review updates, respiratory medications, advancements in procedures and treatments. Discuss use of supplemental oxygen including available portable oxygen systems, continuous and intermittent flow rates, concentrators, personal use and safety guidelines. Review proper use of inhaler and spacers. Provide informative websites for self-education.    Energy Conservation: - Provide group verbal and written instruction for methods to conserve energy, plan and organize activities. Instruct on pacing techniques, use of adaptive equipment and posture/positioning to relieve shortness of breath.   Triggers and Exacerbations: -  Group verbal and written instruction to review types of environmental triggers and ways to prevent exacerbations. Discuss weather changes, air quality and the benefits of nasal washing. Review warning signs and symptoms to help prevent infections. Discuss techniques for effective airway clearance, coughing, and vibrations.   AED/CPR: - Group verbal and written instruction with the use of models to demonstrate the basic use of the AED with the basic ABC's of resuscitation.   Anatomy and Physiology of the Lungs: - Group verbal and written instruction with the use of models to provide basic lung anatomy and physiology related to function, structure and complications of lung disease.   Anatomy & Physiology of the Heart: - Group verbal and written instruction and models provide basic cardiac anatomy and physiology, with the coronary electrical and arterial systems. Review of Valvular disease and Heart Failure   Cardiac Medications: - Group verbal and written instruction to review commonly prescribed medications for heart disease. Reviews the medication, class of the drug, and side effects.   Know Your Numbers and Risk Factors: -Group verbal and written instruction about important numbers in your health.  Discussion of what are risk factors and how they play a role in the disease process.  Review of Cholesterol, Blood Pressure, Diabetes, and BMI and the role they play in your overall health.   Sleep Hygiene: -Provides group verbal and written instruction about how sleep can affect your health.  Define sleep hygiene, discuss sleep cycles and impact of sleep habits. Review good sleep hygiene tips.    Other: -Provides group and verbal instruction on various topics (see comments)    Knowledge Questionnaire Score:    Core Components/Risk Factors/Patient Goals at Admission: Personal Goals and Risk Factors at Admission - 12/07/18 1311      Core Components/Risk Factors/Patient Goals on  Admission    Weight Management  Yes;Weight Maintenance    Intervention  Weight Management: Develop a combined nutrition and exercise program designed to reach desired caloric intake, while maintaining appropriate intake of nutrient and fiber, sodium and fats, and appropriate energy expenditure required for the weight goal.;Weight Management: Provide education and appropriate resources to help participant work on and attain dietary goals.    Admit Weight  178 lb 12.8 oz (81.1 kg)  Goal Weight: Short Term  168 lb (76.2 kg)    Goal Weight: Long Term  158 lb (71.7 kg)    Expected Outcomes  Short Term: Continue to assess and modify interventions until short term weight is achieved;Long Term: Adherence to nutrition and physical activity/exercise program aimed toward attainment of established weight goal    Intervention  Provide education on lifestyle modifcations including regular physical activity/exercise, weight management, moderate sodium restriction and increased consumption of fresh fruit, vegetables, and low fat dairy, alcohol moderation, and smoking cessation.;Monitor prescription use compliance.    Expected Outcomes  Short Term: Continued assessment and intervention until BP is < 140/9m HG in hypertensive participants. < 130/869mHG in hypertensive participants with diabetes, heart failure or chronic kidney disease.;Long Term: Maintenance of blood pressure at goal levels.    Intervention  Provide education and support for participant on nutrition & aerobic/resistive exercise along with prescribed medications to achieve LDL <7061mHDL >56m26m  Expected Outcomes  Short Term: Participant states understanding of desired cholesterol values and is compliant with medications prescribed. Participant is following exercise prescription and nutrition guidelines.;Long Term: Cholesterol controlled with medications as prescribed, with individualized exercise RX and with personalized nutrition plan. Value goals:  LDL < 70mg53mL > 40 mg.       Core Components/Risk Factors/Patient Goals Review:    Core Components/Risk Factors/Patient Goals at Discharge (Final Review):    ITP Comments: ITP Comments    Row Name 12/06/18 1134 12/07/18 1259         ITP Comments  Virtual Orientation completed.  [facility] in media section.  Original referall was faxed on 11/16/18.  Completed initial 6MWT and nutrition eval.  Initial ITP created and sent to review to Dr MilleSabra Heck    Comments: initial ITP

## 2018-12-13 ENCOUNTER — Other Ambulatory Visit: Payer: Self-pay

## 2018-12-13 DIAGNOSIS — R0600 Dyspnea, unspecified: Secondary | ICD-10-CM

## 2018-12-13 NOTE — Progress Notes (Signed)
Daily Session Note  Patient Details  Name: Carlos Barrera MRN: [id number] Date of Birth: [birth year] Referring Provider:     Pulmonary Rehab from 12/07/2018 in [facility] Cardiac and Pulmonary Rehab  Referring Provider  [name]      Encounter Date: 12/13/2018  Check In: Session Check In - 12/13/18 1108      Check-In   Supervising physician immediately available to respond to emergencies  See telemetry face sheet for immediately available ER MD    Location  [facility]-Cardiac & Pulmonary Rehab    Staff Present  Jasper Loser BS, Exercise Physiologist;Amanda Oletta Darter, BA, ACSM CEP, Exercise Physiologist;Dayami Taitt, RN, BSN, CCRP    Virtual Visit  No    Medication changes reported      No    Fall or balance concerns reported     No    Warm-up and Cool-down  Performed on first and last piece of equipment    Resistance Training Performed  Yes    VAD Patient?  No    PAD/SET Patient?  No      Pain Assessment   Currently in Pain?  No/denies          Social History   Tobacco Use  Smoking Status Former Smoker  . Packs/day: 2.00  . Years: 20.00  . Pack years: 40.00  . Types: Cigarettes  . Quit date: 04/20/1980  . Years since quitting: 38.6  Smokeless Tobacco Never Used    Goals Met:  Exercise tolerated well No report of cardiac concerns or symptoms Strength training completed today  Goals Unmet:  Not Applicable  Comments: First full day of exercise!  Patient was oriented to gym and equipment including functions, settings, policies, and procedures.  Patient's individual exercise prescription and treatment plan were reviewed.  All starting workloads were established based on the results of the 6 minute walk test done at initial orientation visit.  The plan for exercise progression was also introduced and progression will be customized based on patient's performance and goals.    Dr. Emily Filbert is Medical Director for [place] and [facility].

## 2018-12-15 ENCOUNTER — Other Ambulatory Visit: Payer: Self-pay

## 2018-12-15 DIAGNOSIS — R0600 Dyspnea, unspecified: Secondary | ICD-10-CM | POA: Diagnosis not present

## 2018-12-15 NOTE — Progress Notes (Signed)
Daily Session Note  Patient Details  Name: Carlos Barrera MRN: [id number] Date of Birth: [birth year] Referring Provider:     Pulmonary Rehab from 12/07/2018 in [facility] Cardiac and Pulmonary Rehab  Referring Provider  [name]      Encounter Date: 12/15/2018  Check In: Session Check In - 12/15/18 1124      Check-In   Supervising physician immediately available to respond to emergencies  See telemetry face sheet for immediately available ER MD    Location  [facility]-Cardiac & Pulmonary Rehab    Staff Present  Vida Rigger RN, Vickki Hearing, BA, ACSM CEP, Exercise Physiologist;Jeanna Durrell BS, Exercise Physiologist    Virtual Visit  No    Medication changes reported      No    Fall or balance concerns reported     No    Warm-up and Cool-down  Performed on first and last piece of equipment    Resistance Training Performed  Yes    VAD Patient?  No    PAD/SET Patient?  No      Pain Assessment   Currently in Pain?  No/denies    Multiple Pain Sites  No          Social History   Tobacco Use  Smoking Status Former Smoker  . Packs/day: 2.00  . Years: 20.00  . Pack years: 40.00  . Types: Cigarettes  . Quit date: 04/20/1980  . Years since quitting: 38.6  Smokeless Tobacco Never Used    Goals Met:  Proper associated with RPD/PD & O2 Sat Independence with exercise equipment Exercise tolerated well No report of cardiac concerns or symptoms Strength training completed today  Goals Unmet:  Not Applicable  Comments: Pt able to follow exercise prescription today without complaint.  Will continue to monitor for progression.    Dr. Emily Filbert is Medical Director for [place] and [facility].

## 2018-12-20 ENCOUNTER — Other Ambulatory Visit: Payer: Self-pay

## 2018-12-20 ENCOUNTER — Encounter: Payer: No Typology Code available for payment source | Attending: Internal Medicine

## 2018-12-20 DIAGNOSIS — R0600 Dyspnea, unspecified: Secondary | ICD-10-CM

## 2018-12-20 DIAGNOSIS — Z87891 Personal history of nicotine dependence: Secondary | ICD-10-CM | POA: Insufficient documentation

## 2018-12-20 NOTE — Progress Notes (Signed)
Daily Session Note  Patient Details  Name: Carlos Barrera MRN: [id number] Date of Birth: 1940/07/17 Referring Provider:     Pulmonary Rehab from 12/07/2018 in [facility] Cardiac and Pulmonary Rehab  Referring Provider  [name]      Encounter Date: 12/20/2018  Check In:      Social History   Tobacco Use  Smoking Status Former Smoker  . Packs/day: 2.00  . Years: 20.00  . Pack years: 40.00  . Types: Cigarettes  . Quit date: 04/20/1980  . Years since quitting: 38.6  Smokeless Tobacco Never Used    Goals Met:  Independence with exercise equipment Exercise tolerated well No report of cardiac concerns or symptoms Strength training completed today  Goals Unmet:  Not Applicable  Comments: Pt able to follow exercise prescription today without complaint.  Will continue to monitor for progression.    Dr. Emily Filbert is Medical Director for [place] and [facility].

## 2018-12-22 ENCOUNTER — Encounter: Payer: No Typology Code available for payment source | Admitting: *Deleted

## 2018-12-22 ENCOUNTER — Other Ambulatory Visit: Payer: Self-pay

## 2018-12-22 DIAGNOSIS — R0600 Dyspnea, unspecified: Secondary | ICD-10-CM

## 2018-12-22 NOTE — Progress Notes (Signed)
Daily Session Note  Patient Details  Name: Carlos Barrera MRN: [id number] Date of Birth: 04-Mar-1941 Referring Provider:     Pulmonary Rehab from 12/07/2018 in [facility] Cardiac and Pulmonary Rehab  Referring Provider  [name]      Encounter Date: 12/22/2018  Check In: Session Check In - 12/22/18 1146      Check-In   Supervising physician immediately available to respond to emergencies  See telemetry face sheet for immediately available ER MD    Location  [facility]-Cardiac & Pulmonary Rehab    Staff Present  Heath Lark, RN, BSN, CCRP;Amanda Sommer, BA, ACSM CEP, Exercise Physiologist;Jeanna Durrell BS, Exercise Physiologist    Virtual Visit  No    Medication changes reported      No    Fall or balance concerns reported     No    Warm-up and Cool-down  Performed on first and last piece of equipment    Resistance Training Performed  Yes    VAD Patient?  No    PAD/SET Patient?  No      Pain Assessment   Currently in Pain?  No/denies          Social History   Tobacco Use  Smoking Status Former Smoker  . Packs/day: 2.00  . Years: 20.00  . Pack years: 40.00  . Types: Cigarettes  . Quit date: 04/20/1980  . Years since quitting: 38.6  Smokeless Tobacco Never Used    Goals Met:  Independence with exercise equipment Exercise tolerated well No report of cardiac concerns or symptoms Strength training completed today  Goals Unmet:  Not Applicable  Comments: Pt able to follow exercise prescription today without complaint.  Will continue to monitor for progression.    Dr. Emily Filbert is Medical Director for [place] and [facility].

## 2018-12-27 ENCOUNTER — Other Ambulatory Visit: Payer: Self-pay

## 2018-12-27 ENCOUNTER — Encounter: Payer: No Typology Code available for payment source | Admitting: *Deleted

## 2018-12-27 DIAGNOSIS — R0600 Dyspnea, unspecified: Secondary | ICD-10-CM

## 2018-12-27 NOTE — Progress Notes (Signed)
Daily Session Note  Patient Details  Name: Carlos Barrera MRN: [id number] Date of Birth: [birth year] Referring Provider:     Pulmonary Rehab from 12/07/2018 in [facility] Cardiac and Pulmonary Rehab  Referring Provider  [name]      Encounter Date: 12/27/2018  Check In: Session Check In - 12/27/18 1100      Check-In   Supervising physician immediately available to respond to emergencies  See telemetry face sheet for immediately available ER MD    Location  [facility]-Cardiac & Pulmonary Rehab    Staff Present  Heath Lark, RN, BSN, CCRP;Amanda Sommer, BA, ACSM CEP, Exercise Physiologist;Joseph Hood RCP,RRT,BSRT    Virtual Visit  No    Medication changes reported      No    Fall or balance concerns reported     No    Warm-up and Cool-down  Performed on first and last piece of equipment    Resistance Training Performed  Yes    VAD Patient?  No    PAD/SET Patient?  No      Pain Assessment   Currently in Pain?  No/denies          Social History   Tobacco Use  Smoking Status Former Smoker  . Packs/day: 2.00  . Years: 20.00  . Pack years: 40.00  . Types: Cigarettes  . Quit date: 04/20/1980  . Years since quitting: 38.7  Smokeless Tobacco Never Used    Goals Met:  Exercise tolerated well Personal goals reviewed No report of cardiac concerns or symptoms Strength training completed today  Goals Unmet:  Not Applicable  Comments: Comments: Reviewed home exercise with pt today.  Pt plans to walk and look into [facility] for exercise.  Reviewed THR, pulse, RPE, sign and symptoms, NTG use, and when to call 911 or MD.  Also discussed weather considerations and indoor options.  Pt voiced understanding.  [name] had an episode of dizziness Sunday - it resolved with rest ( he had taken an inhaler) he was advised to call his Dr if it happens again.  Stayed on Biostep today  No Arm Crank.  Tolerated well.   Dr. Emily Filbert is Medical Director for [place] and  [facility].

## 2018-12-27 NOTE — Progress Notes (Signed)
Daily Session Note  Patient Details  Name: Carlos Barrera MRN: [id number] Date of Birth: 01/24/1941 Referring Provider:     Pulmonary Rehab from 12/07/2018 in [facility] Cardiac and Pulmonary Rehab  Referring Provider  [name]      Encounter Date: 12/27/2018  Check In:      Social History   Tobacco Use  Smoking Status Former Smoker  . Packs/day: 2.00  . Years: 20.00  . Pack years: 40.00  . Types: Cigarettes  . Quit date: 04/20/1980  . Years since quitting: 38.7  Smokeless Tobacco Never Used    Goals Met:  Proper associated with RPD/PD & O2 Sat Independence with exercise equipment Exercise tolerated well  Goals Unmet:  Not Applicable  Comments: Reviewed home exercise with pt today.  Pt plans to walk and look into [facility] for exercise.  Reviewed THR, pulse, RPE, sign and symptoms, NTG use, and when to call 911 or MD.  Also discussed weather considerations and indoor options.  Pt voiced understanding.  Carlos Barrera had an episode of dizziness Sunday - it resolved with rest ( he had taken an inhaler) he was advised to call his Dr if it happens again.  Dr. Emily Filbert is Medical Director for [place] and [facility].

## 2018-12-28 ENCOUNTER — Encounter: Payer: Self-pay | Admitting: *Deleted

## 2018-12-28 ENCOUNTER — Telehealth: Payer: Self-pay | Admitting: Urology

## 2018-12-28 DIAGNOSIS — R0600 Dyspnea, unspecified: Secondary | ICD-10-CM

## 2018-12-28 NOTE — Telephone Encounter (Signed)
Patient was scheduled on 11-22-18 for his 6 month Lupron injection but we CX it due to the shortage. Can he be rescheduled and if so for what? He has Medicare and does not need a PA.  Thanks, Sharyn Lull

## 2018-12-28 NOTE — Progress Notes (Signed)
Pulmonary Individual Treatment Plan  Patient Details  Name: Carlos Barrera MRN: [id number] Date of Birth: 1940/06/22 Referring Provider:     Pulmonary Rehab from 12/07/2018 in [facility]  Referring Provider  [name]      Initial Encounter Date:    Pulmonary Rehab from 12/07/2018 in [facility] Card[facility] and Pulmonary Rehab  Date  12/07/18      Visit Diagnosis: Dyspnea, unspecified type  Patient's Home Medications on Admission:  Current Outpatient Medications:  .  albuterol (VENTOLIN HFA) 108 (90 Base) MCG/ACT inhaler, Inhale 2 puffs into the lungs every 6 (six) hours as needed for wheezing or shortness of breath., Disp: , Rfl:  .  aspirin EC 81 MG tablet, Take 81 mg by mouth daily., Disp: , Rfl:  .  Calcium Carbonate-Vitamin D 600-400 MG-UNIT tablet, , Disp: , Rfl:  .  cephALEXin (KEFLEX) 500 MG capsule, Take 1 capsule (500 mg total) by mouth 2 (two) times daily. (Patient not taking: Reported on 12/06/2018), Disp: 10 capsule, Rfl: 0 .  cetirizine (ZYRTEC) 10 MG tablet, , Disp: , Rfl:  .  cholecalciferol (VITAMIN D) 25 MCG (1000 UT) tablet, , Disp: , Rfl:  .  ibuprofen (ADVIL,MOTRIN) 800 MG tablet, Take 1 tablet (800 mg total) by mouth every 8 (eight) hours as needed for moderate pain. (Patient not taking: Reported on 12/06/2018), Disp: 15 tablet, Rfl: 0 .  levothyroxine (SYNTHROID, LEVOTHROID) 137 MCG tablet, Take 137 mcg by mouth daily before breakfast., Disp: , Rfl:  .  lisinopril (PRINIVIL,ZESTRIL) 10 MG tablet, Take 10 mg by mouth daily., Disp: , Rfl:  .  MAPAP 325 MG tablet, , Disp: , Rfl:  .  Meloxicam 15 MG TBDP, Take by mouth daily as needed., Disp: , Rfl:  .  pravastatin (PRAVACHOL) 20 MG tablet, Take 20 mg by mouth at bedtime., Disp: , Rfl:  .  prazosin (MINIPRESS) 5 MG capsule, , Disp: , Rfl:  .  ranitidine (ZANTAC) 150 MG tablet, Take 150 mg by mouth at bedtime., Disp: , Rfl:  .  REFRESH TEARS 0.5 % SOLN, , Disp: , Rfl:  .  sertraline (ZOLOFT) 100 MG  tablet, Take 100 mg by mouth at bedtime., Disp: , Rfl:  .  SYMBICORT 160-4.5 MCG/ACT inhaler, , Disp: , Rfl:  .  tiotropium (SPIRIVA) 18 MCG inhalation capsule, Place 18 mcg into inhaler and inhale every evening., Disp: , Rfl:   Current Facility-Administered Medications:  .  Leuprolide Acetate (6 Month) (LUPRON) injection 45 mg, 45 mg, Intramuscular, Once, Stoioff, Ronda Fairly, MD  Past Medical History: Past Medical History:  Diagnosis Date  . COPD (chronic obstructive pulmonary disease) ([place])   . Hypertension   . Prostate cancer ([place])   . Thyroid disease     Tobacco Use: Social History   Tobacco Use  Smoking Status Former Smoker  . Packs/day: 2.00  . Years: 20.00  . Pack years: 40.00  . Types: Cigarettes  . Quit date: 04/20/1980  . Years since quitting: 38.7  Smokeless Tobacco Never Used    Labs: Recent Review Flowsheet Data    There is no flowsheet data to display.       Pulmonary Assessment Scores: Pulmonary Assessment Scores    Row Name 12/07/18 1316         ADL UCSD   SOB Score total  62     Rest  1     Walk  2     Stairs  5     Bath  2     Dress  2     Shop  2       CAT Score   CAT Score  29       mMRC Score   mMRC Score  3        UCSD: Self-administered rating of dyspnea associated with activities of daily living (ADLs) 6-point scale (0 = "not at all" to 5 = "maximal or unable to do because of breathlessness")  Scoring Scores range from 0 to 120.  Minimally important difference is 5 units  CAT: CAT can identify the health impairment of COPD patients and is better correlated with disease progression.  CAT has a scoring range of zero to 40. The CAT score is classified into four groups of low (less than 10), medium (10 - 20), high (21-30) and very high (31-40) based on the impact level of disease on health status. A CAT score over 10 suggests significant symptoms.  A worsening CAT score could be explained by an exacerbation, poor medication adherence,  poor inhaler technique, or progression of COPD or comorbid conditions.  CAT MCID is 2 points  mMRC: mMRC (Modified Medical Research Council) Dyspnea Scale is used to assess the degree of baseline functional disability in patients of respiratory disease due to dyspnea. No minimal important difference is established. A decrease in score of 1 point or greater is considered a positive change.   Pulmonary Function Assessment:   Exercise Target Goals: Exercise Program Goal: Individual exercise prescription set using results from initial 6 min walk test and THRR while considering  patient's activity barriers and safety.   Exercise Prescription Goal: Initial exercise prescription builds to 30-45 minutes a day of aerobic activity, 2-3 days per week.  Home exercise guidelines will be given to patient during program as part of exercise prescription that the participant will acknowledge.  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 HR  101 bpm     Max Ex. BP  124/58     2 Minute Post BP  112/58       Interval HR   1 Minute HR  92     2 Minute HR  70 patient holding hands behind back     3 Minute HR  - sig loss     4 Minute HR  60 see previous note - not sure HR accurate     2  Minute Post HR  78     Interval  Heart Rate?  Yes       Interval Oxygen   Interval Oxygen?  Yes     Baseline Oxygen Saturation %  96 %     1 Minute Oxygen Saturation %  96 %     1 Minute Liters of Oxygen  0 L     2 Minute Oxygen Saturation %  95 %     2 Minute Liters of Oxygen  0 L     3 Minute Oxygen Saturation %  96 %     3 Minute Liters of Oxygen  0 L     4 Minute Oxygen Saturation %  95 %     4 Minute Liters of Oxygen  0 L     2 Minute Post Oxygen Saturation %  96 %     2 Minute Post Liters of Oxygen  0 L       Oxygen Initial Assessment: Oxygen Initial Assessment - 12/06/18 1114      Home Oxygen   Home Oxygen Device  None   requested oxygen therapy, consult scheduled   Sleep Oxygen Prescription  None    Home Exercise Oxygen Prescription  None    Home at Rest Exercise Oxygen Prescription  None      Initial 6 min Walk   Oxygen Used  None      Program Oxygen Prescription   Program Oxygen Prescription  None      Intervention   Short Term Goals  To learn and exhibit compliance with exercise, home and travel O2 prescription;To learn and understand importance of monitoring SPO2 with pulse oximeter and demonstrate accurate use of the pulse oximeter.;To learn and understand importance of maintaining oxygen saturations>88%;To learn and demonstrate proper pursed lip breathing techniques or other breathing techniques.;To learn and demonstrate proper use of respiratory medications    Long  Term Goals  Exhibits compliance with exercise, home and travel O2 prescription;Verbalizes importance of monitoring SPO2 with pulse oximeter and return demonstration;Maintenance of O2 saturations>88%;Exhibits proper breathing techniques, such as pursed lip breathing or other method taught during program session;Compliance with respiratory medication;Demonstrates proper use of MDI's       Oxygen Re-Evaluation:   Oxygen Discharge (Final Oxygen Re-Evaluation):   Initial Exercise Prescription: Initial Exercise Prescription -  12/07/18 1300      Date of Initial Exercise RX and Referring Provider   Date  12/07/18    Referring Provider  [name]      Treadmill   MPH  1    Grade  0    Minutes  15   1 min then rest   METs  1.2      NuStep   Level  1    SPM  80    Minutes  15    METs  1.2      Arm Ergometer   Level  1    RPM  50    Minutes  15    METs  1.2      Biostep-RELP   Level  1    SPM  50    Minutes  15    METs  2      Prescription Details   Frequency (times per week)  2    Duration  Progress to 30 minutes of continuous aerobic without signs/symptoms of physical distress      Intensity   THRR 40-80% of Max Heartrate  104-130  Ratings of Perceived Exertion  11-13    Perceived Dyspnea  0-4      Progression   Progression  Continue to progress workloads to maintain intensity without signs/symptoms of physical distress.      Resistance Training   Training Prescription  Yes    Weight  3 lb    Reps  10-15       Perform Capillary Blood Glucose checks as needed.  Exercise Prescription Changes: Exercise Prescription Changes    Row Name 12/07/18 1300 12/20/18 1500           Response to Exercise   Blood Pressure (Admit)  106/48  112/68      Blood Pressure (Exercise)  124/58  128/60      Blood Pressure (Exit)  112/58  94/68      Heart Rate (Admit)  101 bpm  95 bpm      Heart Rate (Exercise)  92 bpm  88 bpm      Heart Rate (Exit)  78 bpm  80 bpm      Oxygen Saturation (Admit)  96 %  97 %      Oxygen Saturation (Exercise)  95 %  93 %      Oxygen Saturation (Exit)  96 %  98 %      Rating of Perceived Exertion (Exercise)  17  11      Perceived Dyspnea (Exercise)  4  2      Symptoms  none  none      Duration  Progress to 30 minutes of  aerobic without signs/symptoms of physical distress  Progress to 30 minutes of  aerobic without signs/symptoms of physical distress        Progression   Progression  Continue to progress workloads to maintain intensity without signs/symptoms  of physical distress.  Continue to progress workloads to maintain intensity without signs/symptoms of physical distress.        Resistance Training   Training Prescription  -  Yes      Weight  -  3 lb      Reps  -  10-15        NuStep   Level  -  4      SPM  -  80      Minutes  -  15      METs  -  2         Exercise Comments: Exercise Comments    Row Name 12/13/18 1302 12/27/18 1056         Exercise Comments  First full day of exercise!  Patient was oriented to gym and equipment including functions, settings, policies, and procedures.  Patient's individual exercise prescription and treatment plan were reviewed.  All starting workloads were established based on the results of the 6 minute walk test done at initial orientation visit.  The plan for exercise progression was also introduced and progression will be customized based on patient's performance and goals.  Reviewed home exercise with pt today.  Pt plans to walk and look into [facility] for exercise.  Reviewed THR, pulse, RPE, sign and symptoms, NTG use, and when to call 911 or MD.  Also discussed weather considerations and indoor options.  Pt voiced understanding.  Buds shoulder has been sore since last session so he skipped the Arm crank today.         Exercise Goals and Review: Exercise Goals    Row Name 12/07/18 1309  Exercise Goals   Increase Physical Activity  Yes       Intervention  Provide advice, education, support and counseling about physical activity/exercise needs.;Develop an individualized exercise prescription for aerobic and resistive training based on initial evaluation findings, risk stratification, comorbidities and participant's personal goals.       Expected Outcomes  Short Term: Attend rehab on a regular basis to increase amount of physical activity.;Long Term: Add in home exercise to make exercise part of routine and to increase amount of physical activity.;Long Term: Exercising regularly at  least 3-5 days a week.       Increase Strength and Stamina  Yes       Intervention  Provide advice, education, support and counseling about physical activity/exercise needs.;Develop an individualized exercise prescription for aerobic and resistive training based on initial evaluation findings, risk stratification, comorbidities and participant's personal goals.       Expected Outcomes  Short Term: Increase workloads from initial exercise prescription for resistance, speed, and METs.;Short Term: Perform resistance training exercises routinely during rehab and add in resistance training at home;Long Term: Improve cardiorespiratory fitness, muscular endurance and strength as measured by increased METs and functional capacity (6MWT)       Able to understand and use rate of perceived exertion (RPE) scale  Yes       Intervention  Provide education and explanation on how to use RPE scale       Expected Outcomes  Short Term: Able to use RPE daily in rehab to express subjective intensity level;Long Term:  Able to use RPE to guide intensity level when exercising independently       Able to understand and use Dyspnea scale  Yes       Intervention  Provide education and explanation on how to use Dyspnea scale       Expected Outcomes  Short Term: Able to use Dyspnea scale daily in rehab to express subjective sense of shortness of breath during exertion;Long Term: Able to use Dyspnea scale to guide intensity level when exercising independently       Knowledge and understanding of Target Heart Rate Range (THRR)  Yes       Intervention  Provide education and explanation of THRR including how the numbers were predicted and where they are located for reference       Expected Outcomes  Short Term: Able to state/look up THRR;Short Term: Able to use daily as guideline for intensity in rehab;Long Term: Able to use THRR to govern intensity when exercising independently       Able to check pulse independently  Yes        Intervention  Provide education and demonstration on how to check pulse in carotid and radial arteries.;Review the importance of being able to check your own pulse for safety during independent exercise       Expected Outcomes  Short Term: Able to explain why pulse checking is important during independent exercise;Long Term: Able to check pulse independently and accurately       Understanding of Exercise Prescription  Yes       Intervention  Provide education, explanation, and written materials on patient's individual exercise prescription       Expected Outcomes  Short Term: Able to explain program exercise prescription;Long Term: Able to explain home exercise prescription to exercise independently          Exercise Goals Re-Evaluation : Exercise Goals Re-Evaluation    Row Name 12/13/18 1303 12/20/18 1545 12/27/18 1056  Exercise Goal Re-Evaluation   Exercise Goals Review  Increase Physical Activity;Increase Strength and Stamina;Able to understand and use rate of perceived exertion (RPE) scale;Able to understand and use Dyspnea scale;Knowledge and understanding of Target Heart Rate Range (THRR);Able to check pulse independently;Understanding of Exercise Prescription  Increase Physical Activity;Increase Strength and Stamina;Able to understand and use rate of perceived exertion (RPE) scale;Able to understand and use Dyspnea scale;Knowledge and understanding of Target Heart Rate Range (THRR);Able to check pulse independently;Understanding of Exercise Prescription  Increase Physical Activity;Increase Strength and Stamina;Able to understand and use rate of perceived exertion (RPE) scale;Knowledge and understanding of Target Heart Rate Range (THRR);Able to check pulse independently;Understanding of Exercise Prescription     Comments  Reviewed RPE scale, THR and program prescription with pt today.  Pt voiced understanding and was given a copy of goals to take home.  Buds knee gives him a lot of  trouble with exercise and he only uses one leg a lot of times.  he has progressed workloads and is reporting less SOB with exercise.  he started with working for 1-2 minutes and resting and is building up from there.  Reviewed home exercise with pt today.  Pt plans to walk and look into [facility] for exercise.  Reviewed THR, pulse, RPE, sign and symptoms, NTG use, and when to call 911 or MD.  Also discussed weather considerations and indoor options.  Pt voiced understanding.     Expected Outcomes  Short: Use RPE daily to regulate intensity. Long: Follow program prescription in THR.  Short - complete 15 min without resting Long - complete 30 min without rest  -        Discharge Exercise Prescription (Final Exercise Prescription Changes): Exercise Prescription Changes - 12/20/18 1500      Response to Exercise   Blood Pressure (Admit)  112/68    Blood Pressure (Exercise)  128/60    Blood Pressure (Exit)  94/68    Heart Rate (Admit)  95 bpm    Heart Rate (Exercise)  88 bpm    Heart Rate (Exit)  80 bpm    Oxygen Saturation (Admit)  97 %    Oxygen Saturation (Exercise)  93 %    Oxygen Saturation (Exit)  98 %    Rating of Perceived Exertion (Exercise)  11    Perceived Dyspnea (Exercise)  2    Symptoms  none    Duration  Progress to 30 minutes of  aerobic without signs/symptoms of physical distress      Progression   Progression  Continue to progress workloads to maintain intensity without signs/symptoms of physical distress.      Resistance Training   Training Prescription  Yes    Weight  3 lb    Reps  10-15      NuStep   Level  4    SPM  80    Minutes  15    METs  2       Nutrition:  Target Goals: Understanding of nutrition guidelines, daily intake of sodium <1561m, cholesterol <2066m calories 30% from fat and 7% or less from saturated fats, daily to have 5 or more servings of fruits and vegetables.  Biometrics: Pre Biometrics - 12/07/18 1310      Pre Biometrics   Height  5'  7.75" (1.721 m)    Weight  178 lb 12.8 oz (81.1 kg)    BMI (Calculated)  27.38        Nutrition Therapy Plan and Nutrition Goals:  Nutrition Therapy & Goals - 12/07/18 1236      Nutrition Therapy   Diet  low Na, HH diet    Protein (specify units)  65-70g    Fiber  30 grams    Whole Grain Foods  3 servings    Saturated Fats  12 max. grams    Fruits and Vegetables  5 servings/day    Sodium  1.5 grams      Personal Nutrition Goals   Nutrition Goal  ST: add protein foods to B (when he has it) and protein to snack after lunch LT: lose 5 lbs and limit SOB    Comments  Pt reports eating breakfast sometimes (OJ, muffin, sometimes yogurt). Takes out [employer] for lunch with veggies and salad and meat - sometimes potatoes. Pt will have small snack of fruit with whipped cream. Discussed maybe adding peanut butter or yogurt to add protein. Disucssed HH, low Na eating and higher needs. Pt reports having a balanced relationship with food.      Intervention Plan   Intervention  Prescribe, educate and counsel regarding individualized specific dietary modifications aiming towards targeted core components such as weight, hypertension, lipid management, diabetes, heart failure and other comorbidities.;Nutrition handout(s) given to patient.    Expected Outcomes  Short Term Goal: Understand basic principles of dietary content, such as calories, fat, sodium, cholesterol and nutrients.;Short Term Goal: A plan has been developed with personal nutrition goals set during dietitian appointment.;Long Term Goal: Adherence to prescribed nutrition plan.       Nutrition Assessments:   Nutrition Goals Re-Evaluation:   Nutrition Goals Discharge (Final Nutrition Goals Re-Evaluation):   Psychosocial: Target Goals: Acknowledge presence or absence of significant depression and/or stress, maximize coping skills, provide positive support system. Participant is able to verbalize types and ability to use  techniques and skills needed for reducing stress and depression.   Initial Review & Psychosocial Screening: Initial Psych Review & Screening - 12/06/18 1117      Initial Review   Current issues with  Current Stress Concerns;Current Anxiety/Panic    Source of Stress Concerns  Poor Coping Skills;Chronic Illness    Comments  PTSD on Zoloft, not currently seeing counselor, does not like being approached from behind, unexpected noises      [place]?  Yes    Comments  Church family, good close neighbors, son lives nearby      Barriers   Psychosocial barriers to participate in program  The patient should benefit from training in stress management and relaxation.;Psychosocial barriers identified (see note)      Screening Interventions   Interventions  Encouraged to exercise;Program counselor consult;To provide support and resources with identified psychosocial needs;Provide feedback about the scores to participant    Expected Outcomes  Short Term goal: Utilizing psychosocial counselor, staff and physician to assist with identification of specific Stressors or current issues interfering with healing process. Setting desired goal for each stressor or current issue identified.;Long Term Goal: Stressors or current issues are controlled or eliminated.;Short Term goal: Identification and review with participant of any Quality of Life or Depression concerns found by scoring the questionnaire.;Long Term goal: The participant improves quality of Life and PHQ9 Scores as seen by post scores and/or verbalization of changes       Quality of Life Scores:  Scores of 19 and below usually indicate a poorer quality of life in these areas.  A difference of  2-3 points is a clinically meaningful difference.  A difference of 2-3 points in the total score of the Quality of Life Index has been associated with significant improvement in overall quality of life, self-image, physical symptoms,  and general health in studies assessing change in quality of life.  PHQ-9: Recent Review Flowsheet Data    Depression screen [facility] 2/9 12/07/2018   Decreased Interest 0   Down, Depressed, Hopeless 0   PHQ - 2 Score 0   Altered sleeping 3   Tired, decreased energy 2   Change in appetite 1   Feeling bad or failure about yourself  0   Trouble concentrating 0   Moving slowly or fidgety/restless 3   Suicidal thoughts 0   PHQ-9 Score 9   Difficult doing work/chores Somewhat difficult     Interpretation of Total Score  Total Score Depression Severity:  1-4 = Minimal depression, 5-9 = Mild depression, 10-14 = Moderate depression, 15-19 = Moderately severe depression, 20-27 = Severe depression   Psychosocial Evaluation and Intervention:   Psychosocial Re-Evaluation:   Psychosocial Discharge (Final Psychosocial Re-Evaluation):   Education: Education Goals: Education classes will be provided on a weekly basis, covering required topics. Participant will state understanding/return demonstration of topics presented.  Learning Barriers/Preferences: Learning Barriers/Preferences - 12/06/18 1115      Learning Barriers/Preferences   Learning Barriers  Sight;Hearing;Exercise Concerns   wears glasses and hearing aid, wears knee brace   Learning Preferences  Individual Instruction;Written Material       Education Topics:  Initial Evaluation Education: - Verbal, written and demonstration of respiratory meds, oximetry and breathing techniques. Instruction on use of nebulizers and MDIs and importance of monitoring MDI activations.   General Nutrition Guidelines/Fats and Fiber: -Group instruction provided by verbal, written material, models and posters to present the general guidelines for heart healthy nutrition. Gives an explanation and review of dietary fats and fiber.   Controlling Sodium/Reading Food Labels: -Group verbal and written material supporting the discussion of sodium use  in heart healthy nutrition. Review and explanation with models, verbal and written materials for utilization of the food label.   Exercise Physiology & General Exercise Guidelines: - Group verbal and written instruction with models to review the exercise physiology of the cardiovascular system and associated critical values. Provides general exercise guidelines with specific guidelines to those with heart or lung disease.    Aerobic Exercise & Resistance Training: - Gives group verbal and written instruction on the various components of exercise. Focuses on aerobic and resistive training programs and the benefits of this training and how to safely progress through these programs.   Flexibility, Balance, Mind/Body Relaxation: Provides group verbal/written instruction on the benefits of flexibility and balance training, including mind/body exercise modes such as yoga, pilates and tai chi.  Demonstration and skill practice provided.   Stress and Anxiety: - Provides group verbal and written instruction about the health risks of elevated stress and causes of high stress.  Discuss the correlation between heart/lung disease and anxiety and treatment options. Review healthy ways to manage with stress and anxiety.   Depression: - Provides group verbal and written instruction on the correlation between heart/lung disease and depressed mood, treatment options, and the stigmas associated with seeking treatment.   Exercise & Equipment Safety: - Individual verbal instruction and demonstration of equipment use and safety with use of the equipment.   Pulmonary Rehab from 12/07/2018 in [facility] Cardiac and Pulmonary Rehab  Date  12/07/18  Educator  AS  Instruction Review Code  1- Verbalizes Understanding  Infection Prevention: - Provides verbal and written material to individual with discussion of infection control including proper hand washing and proper equipment cleaning during exercise session.    Pulmonary Rehab from 12/07/2018 in [facility] Cardiac and Pulmonary Rehab  Date  12/07/18  Educator  AS  Instruction Review Code  1- Verbalizes Understanding      Falls Prevention: - Provides verbal and written material to individual with discussion of falls prevention and safety.   Pulmonary Rehab from 12/07/2018 in [facility] Cardiac and Pulmonary Rehab  Date  12/07/18  Educator  AS  Instruction Review Code  1- Verbalizes Understanding      Diabetes: - Individual verbal and written instruction to review signs/symptoms of diabetes, desired ranges of glucose level fasting, after meals and with exercise. Advice that pre and post exercise glucose checks will be done for 3 sessions at entry of program.   Chronic Lung Diseases: - Group verbal and written instruction to review updates, respiratory medications, advancements in procedures and treatments. Discuss use of supplemental oxygen including available portable oxygen systems, continuous and intermittent flow rates, concentrators, personal use and safety guidelines. Review proper use of inhaler and spacers. Provide informative websites for self-education.    Energy Conservation: - Provide group verbal and written instruction for methods to conserve energy, plan and organize activities. Instruct on pacing techniques, use of adaptive equipment and posture/positioning to relieve shortness of breath.   Triggers and Exacerbations: - Group verbal and written instruction to review types of environmental triggers and ways to prevent exacerbations. Discuss weather changes, air quality and the benefits of nasal washing. Review warning signs and symptoms to help prevent infections. Discuss techniques for effective airway clearance, coughing, and vibrations.   AED/CPR: - Group verbal and written instruction with the use of models to demonstrate the basic use of the AED with the basic ABC's of resuscitation.   Anatomy and Physiology of the Lungs: - Group  verbal and written instruction with the use of models to provide basic lung anatomy and physiology related to function, structure and complications of lung disease.   Anatomy & Physiology of the Heart: - Group verbal and written instruction and models provide basic cardiac anatomy and physiology, with the coronary electrical and arterial systems. Review of Valvular disease and Heart Failure   Cardiac Medications: - Group verbal and written instruction to review commonly prescribed medications for heart disease. Reviews the medication, class of the drug, and side effects.   Know Your Numbers and Risk Factors: -Group verbal and written instruction about important numbers in your health.  Discussion of what are risk factors and how they play a role in the disease process.  Review of Cholesterol, Blood Pressure, Diabetes, and BMI and the role they play in your overall health.   Sleep Hygiene: -Provides group verbal and written instruction about how sleep can affect your health.  Define sleep hygiene, discuss sleep cycles and impact of sleep habits. Review good sleep hygiene tips.    Other: -Provides group and verbal instruction on various topics (see comments)    Knowledge Questionnaire Score: Knowledge Questionnaire Score - 12/07/18 1322      Knowledge Questionnaire Score   Pre Score  14/18        Core Components/Risk Factors/Patient Goals at Admission: Personal Goals and Risk Factors at Admission - 12/07/18 1311      Core Components/Risk Factors/Patient Goals on Admission    Weight Management  Yes;Weight Maintenance    Intervention  Weight Management: Develop a combined  nutrition and exercise program designed to reach desired caloric intake, while maintaining appropriate intake of nutrient and fiber, sodium and fats, and appropriate energy expenditure required for the weight goal.;Weight Management: Provide education and appropriate resources to help participant work on and attain  dietary goals.    Admit Weight  178 lb 12.8 oz (81.1 kg)    Goal Weight: Short Term  168 lb (76.2 kg)    Goal Weight: Long Term  158 lb (71.7 kg)    Expected Outcomes  Short Term: Continue to assess and modify interventions until short term weight is achieved;Long Term: Adherence to nutrition and physical activity/exercise program aimed toward attainment of established weight goal    Intervention  Provide education on lifestyle modifcations including regular physical activity/exercise, weight management, moderate sodium restriction and increased consumption of fresh fruit, vegetables, and low fat dairy, alcohol moderation, and smoking cessation.;Monitor prescription use compliance.    Expected Outcomes  Short Term: Continued assessment and intervention until BP is < 140/26m HG in hypertensive participants. < 130/833mHG in hypertensive participants with diabetes, heart failure or chronic kidney disease.;Long Term: Maintenance of blood pressure at goal levels.    Intervention  Provide education and support for participant on nutrition & aerobic/resistive exercise along with prescribed medications to achieve LDL <70104mHDL >40m85m  Expected Outcomes  Short Term: Participant states understanding of desired cholesterol values and is compliant with medications prescribed. Participant is following exercise prescription and nutrition guidelines.;Long Term: Cholesterol controlled with medications as prescribed, with individualized exercise RX and with personalized nutrition plan. Value goals: LDL < 70mg96mL > 40 mg.       Core Components/Risk Factors/Patient Goals Review:    Core Components/Risk Factors/Patient Goals at Discharge (Final Review):    ITP Comments: ITP Comments    Row Name 12/06/18 1134 12/07/18 1259 12/28/18 0611       ITP Comments  Virtual Orientation completed.  [facility] in [facility] section[facility]  Original referall was faxed on 11/16/18.   Completed initial 6MWT and nutrition eval.  Initial ITP created and sent to review to Dr MilleSabra HeckDay review. Continue with ITP unless directed changes per Medical Director review.        Comments:

## 2018-12-29 ENCOUNTER — Encounter: Payer: No Typology Code available for payment source | Admitting: *Deleted

## 2018-12-29 ENCOUNTER — Other Ambulatory Visit: Payer: Self-pay

## 2018-12-29 DIAGNOSIS — R0600 Dyspnea, unspecified: Secondary | ICD-10-CM

## 2018-12-29 NOTE — Progress Notes (Signed)
Daily Session Note  Patient Details  Name: GADIEL JOHN MRN: [id number] Date of Birth: Jan 06, 1941 Referring Provider:     Pulmonary Rehab from 12/07/2018 in [facility] Cardiac and Pulmonary Rehab  Referring Provider  [name]      Encounter Date: 12/29/2018  Check In: Session Check In - 12/29/18 1031      Check-In   Supervising physician immediately available to respond to emergencies  See telemetry face sheet for immediately available ER MD    Location  [facility]-Cardiac & Pulmonary Rehab    Staff Present  Renita Papa, RN Vickki Hearing, BA, ACSM CEP, Exercise Physiologist;Jeanna Durrell BS, Exercise Physiologist    Virtual Visit  No    Medication changes reported      No    Fall or balance concerns reported     No    Warm-up and Cool-down  Performed on first and last piece of equipment    Resistance Training Performed  Yes    VAD Patient?  No    PAD/SET Patient?  No      Pain Assessment   Currently in Pain?  No/denies          Social History   Tobacco Use  Smoking Status Former Smoker  . Packs/day: 2.00  . Years: 20.00  . Pack years: 40.00  . Types: Cigarettes  . Quit date: 04/20/1980  . Years since quitting: 38.7  Smokeless Tobacco Never Used    Goals Met:  Proper associated with RPD/PD & O2 Sat Independence with exercise equipment Using PLB without cueing & demonstrates good technique Exercise tolerated well No report of cardiac concerns or symptoms Strength training completed today  Goals Unmet:  Not Applicable  Comments: Pt able to follow exercise prescription today without complaint.  Will continue to monitor for progression.    Dr. Emily Filbert is Medical Director for [place] and [facility].

## 2018-12-29 NOTE — Telephone Encounter (Signed)
Spoke to patient and offered for him to come in and get a 4 month Lupron. He states he is going to the [place] to have the Lupron injections. He said he is scheduled in February to have it. I informed him that if he did the 4 month it would put him at January and he could go to the [place] to have a 6 month in February and will not keep him with out for that long. Patient refused and said he will wait until February.

## 2019-01-03 ENCOUNTER — Other Ambulatory Visit: Payer: Self-pay

## 2019-01-03 ENCOUNTER — Encounter: Payer: No Typology Code available for payment source | Admitting: *Deleted

## 2019-01-03 DIAGNOSIS — R0600 Dyspnea, unspecified: Secondary | ICD-10-CM | POA: Diagnosis not present

## 2019-01-03 NOTE — Progress Notes (Signed)
Daily Session Note  Patient Details  Name: Carlos Barrera MRN: [id number] Date of Birth: 12-15-1940 Referring Provider:     Pulmonary Rehab from 12/07/2018 in [facility] Cardiac and Pulmonary Rehab  Referring Provider  [place]      Encounter Date: 01/03/2019  Check In: Session Check In - 01/03/19 1024      Check-In   Supervising physician immediately available to respond to emergencies  See telemetry face sheet for immediately available ER MD    Location  [facility]-Cardiac & Pulmonary Rehab    Staff Present  Heath Lark, RN, BSN, CCRP;Melissa Pahala RDN, LDN;Joseph [employer], [place], ACSM CEP, Exercise Physiologist    Virtual Visit  No    Medication changes reported      No    Fall or balance concerns reported     No    Warm-up and Cool-down  Performed on first and last piece of equipment    Resistance Training Performed  Yes    VAD Patient?  No    PAD/SET Patient?  No      Pain Assessment   Currently in Pain?  No/denies          Social History   Tobacco Use  Smoking Status Former Smoker  . Packs/day: 2.00  . Years: 20.00  . Pack years: 40.00  . Types: Cigarettes  . Quit date: 04/20/1980  . Years since quitting: 38.7  Smokeless Tobacco Never Used    Goals Met:  Independence with exercise equipment Exercise tolerated well No report of cardiac concerns or symptoms  Goals Unmet:  Not Applicable  Comments: Pt able to follow exercise prescription today without complaint.  Will continue to monitor for progression.  PHQ9 Score at  5   Is down from 9 at admission.  Will repeat in 30 days  Dr. Emily Filbert is Medical Director for [place] and [facility].

## 2019-01-05 ENCOUNTER — Other Ambulatory Visit: Payer: Self-pay

## 2019-01-05 DIAGNOSIS — R0600 Dyspnea, unspecified: Secondary | ICD-10-CM

## 2019-01-05 NOTE — Progress Notes (Signed)
Daily Session Note  Patient Details  Name: Carlos Barrera MRN: [id number] Date of Birth: 1940-06-04 Referring Provider:     Pulmonary Rehab from 12/07/2018 in [facility] Cardiac and Pulmonary Rehab  Referring Provider  [name]      Encounter Date: 01/05/2019  Check In: Session Check In - 01/05/19 1025      Check-In   Supervising physician immediately available to respond to emergencies  See telemetry face sheet for immediately available ER MD    Location  [facility]-Cardiac & Pulmonary Rehab    Staff Present  Vida Rigger RN, Vickki Hearing, BA, ACSM CEP, Exercise Physiologist;Jeanna Durrell BS, Exercise Physiologist    Virtual Visit  No    Medication changes reported      No    Fall or balance concerns reported     No    Warm-up and Cool-down  Performed on first and last piece of equipment    Resistance Training Performed  Yes    VAD Patient?  No    PAD/SET Patient?  No      Pain Assessment   Currently in Pain?  No/denies    Multiple Pain Sites  No          Social History   Tobacco Use  Smoking Status Former Smoker  . Packs/day: 2.00  . Years: 20.00  . Pack years: 40.00  . Types: Cigarettes  . Quit date: 04/20/1980  . Years since quitting: 38.7  Smokeless Tobacco Never Used    Goals Met:  Proper associated with RPD/PD & O2 Sat Exercise tolerated well No report of cardiac concerns or symptoms Strength training completed today  Goals Unmet:  Not Applicable  Comments: Pt able to follow exercise prescription today without complaint.  Will continue to monitor for progression.   Dr. Emily Filbert is Medical Director for [place] and [facility].

## 2019-01-10 ENCOUNTER — Other Ambulatory Visit: Payer: Self-pay

## 2019-01-10 ENCOUNTER — Encounter: Payer: No Typology Code available for payment source | Admitting: *Deleted

## 2019-01-10 DIAGNOSIS — R0600 Dyspnea, unspecified: Secondary | ICD-10-CM | POA: Diagnosis not present

## 2019-01-10 NOTE — Progress Notes (Signed)
Daily Session Note  Patient Details  Name: Carlos Barrera MRN: [id number] Date of Birth: 26-Jun-1940 Referring Provider:     Pulmonary Rehab from 12/07/2018 in [facility] Cardiac and Pulmonary Rehab  Referring Provider  [name]      Encounter Date: 01/10/2019  Check In: Session Check In - 01/10/19 1026      Check-In   Supervising physician immediately available to respond to emergencies  See telemetry face sheet for immediately available ER MD    Location  [facility]-Cardiac & Pulmonary Rehab    Staff Present  Heath Lark, RN, BSN, CCRP;Amanda Sommer, BA, ACSM CEP, Exercise Physiologist;Joseph Hood RCP,RRT,BSRT    Virtual Visit  No    Medication changes reported      No    Fall or balance concerns reported     No    Warm-up and Cool-down  Performed on first and last piece of equipment    Resistance Training Performed  Yes    VAD Patient?  No    PAD/SET Patient?  No      Pain Assessment   Currently in Pain?  No/denies      2    Social History   Tobacco Use  Smoking Status Former Smoker  . Packs/day: 2.00  . Years: 20.00  . Pack years: 40.00  . Types: Cigarettes  . Quit date: 04/20/1980  . Years since quitting: 38.7  Smokeless Tobacco Never Used    Goals Met:  Independence with exercise equipment Exercise tolerated well No report of cardiac concerns or symptoms  Goals Unmet:  Not Applicable  Comments: Pt able to follow exercise prescription today without complaint.  Will continue to monitor for progression.    Dr. Emily Filbert is Medical Director for [place] and [facility].

## 2019-01-11 ENCOUNTER — Other Ambulatory Visit: Payer: Self-pay

## 2019-01-11 ENCOUNTER — Inpatient Hospital Stay: Payer: Medicare Other | Attending: Radiation Oncology

## 2019-01-11 DIAGNOSIS — C61 Malignant neoplasm of prostate: Secondary | ICD-10-CM | POA: Diagnosis not present

## 2019-01-11 LAB — PSA: Prostatic Specific Antigen: <0.01 ng/mL (ref 0.00–4.00)

## 2019-01-12 ENCOUNTER — Encounter: Payer: No Typology Code available for payment source | Admitting: *Deleted

## 2019-01-12 DIAGNOSIS — R0600 Dyspnea, unspecified: Secondary | ICD-10-CM | POA: Diagnosis not present

## 2019-01-12 NOTE — Progress Notes (Signed)
Daily Session Note  Patient Details  Name: Carlos Barrera MRN: [id number] Date of Birth: 1940-04-28 Referring Provider:     Pulmonary Rehab from 12/07/2018 in [facility] Cardiac and Pulmonary Rehab  Referring Provider  [name]      Encounter Date: 01/12/2019  Check In: Session Check In - 01/12/19 1028      Check-In   Supervising physician immediately available to respond to emergencies  See telemetry face sheet for immediately available ER MD    Location  [facility]-Cardiac & Pulmonary Rehab    Staff Present  Justin Mend RCP,RRT,BSRT;Jessica Sidney, MA, RCEP, CCRP, CCET;Jeanna Durrell BS, Exercise Physiologist;[place]roll [place], Therapist, sports, BSN-BC, CCRP;Amanda Sommer, BA, ACSM CEP, Exercise Physiologist    Virtual Visit  No    Medication changes reported      No    Fall or balance concerns reported     No    Warm-up and Cool-down  Performed on first and last piece of equipment    Resistance Training Performed  Yes    VAD Patient?  No    PAD/SET Patient?  No      Pain Assessment   Currently in Pain?  No/denies          Social History   Tobacco Use  Smoking Status Former Smoker  . Packs/day: 2.00  . Years: 20.00  . Pack years: 40.00  . Types: Cigarettes  . Quit date: 04/20/1980  . Years since quitting: 38.7  Smokeless Tobacco Never Used    Goals Met:  Independence with exercise equipment Exercise tolerated well Personal goals reviewed No report of cardiac concerns or symptoms Strength training completed today  Goals Unmet:  Not Applicable  Comments: Pt able to follow exercise prescription today without complaint.  Will continue to monitor for progression.    Dr. Emily Filbert is Medical Director for [place] and [facility].

## 2019-01-17 ENCOUNTER — Encounter: Payer: No Typology Code available for payment source | Admitting: *Deleted

## 2019-01-17 ENCOUNTER — Other Ambulatory Visit: Payer: Self-pay

## 2019-01-17 DIAGNOSIS — R0600 Dyspnea, unspecified: Secondary | ICD-10-CM | POA: Diagnosis not present

## 2019-01-17 NOTE — Progress Notes (Signed)
Daily Session Note  Patient Details  Name: Carlos Barrera MRN: [id number] Date of Birth: 10/22/1940 Referring Provider:     Pulmonary Rehab from 12/07/2018 in [facility] Cardiac and Pulmonary Rehab  Referring Provider  [name]      Encounter Date: 01/17/2019  Check In: Session Check In - 01/17/19 1109      Check-In   Supervising physician immediately available to respond to emergencies  See telemetry face sheet for immediately available ER MD    Location  [facility]-Cardiac & Pulmonary Rehab    Staff Present  Darel Hong, RN Vickki Hearing, BA, ACSM CEP, Exercise Physiologist;Joseph Tessie Fass RCP,RRT,BSRT;Diane Joya Gaskins RN,BSN;Melissa Ravinia RDN, LDN    Virtual Visit  No    Medication changes reported      No    Fall or balance concerns reported     No    Warm-up and Cool-down  Performed on first and last piece of equipment    Resistance Training Performed  Yes    VAD Patient?  No    PAD/SET Patient?  No      Pain Assessment   Currently in Pain?  No/denies          Social History   Tobacco Use  Smoking Status Former Smoker  . Packs/day: 2.00  . Years: 20.00  . Pack years: 40.00  . Types: Cigarettes  . Quit date: 04/20/1980  . Years since quitting: 38.7  Smokeless Tobacco Never Used    Goals Met:  Proper associated with RPD/PD & O2 Sat Independence with exercise equipment Using PLB without cueing & demonstrates good technique Exercise tolerated well Strength training completed today  Goals Unmet:  Not Applicable  Comments: Pt able to follow exercise prescription today without complaint.  Will continue to monitor for progression.    Dr. Emily Filbert is Medical Director for [place] and [facility].

## 2019-01-18 ENCOUNTER — Ambulatory Visit
Admission: RE | Admit: 2019-01-18 | Discharge: 2019-01-18 | Disposition: A | Discharge status: Home / Self Care | Payer: Medicare Other | Source: Ambulatory Visit | Attending: Radiation Oncology | Admitting: Radiation Oncology

## 2019-01-18 ENCOUNTER — Other Ambulatory Visit: Payer: Self-pay | Admitting: *Deleted

## 2019-01-18 ENCOUNTER — Other Ambulatory Visit: Payer: Self-pay

## 2019-01-18 ENCOUNTER — Encounter: Payer: Self-pay | Admitting: *Deleted

## 2019-01-18 ENCOUNTER — Encounter: Payer: Self-pay | Admitting: Radiation Oncology

## 2019-01-18 VITALS — BP 135/75 | HR 83 | Temp 97.3°F | Resp 16 | Wt 176.4 lb

## 2019-01-18 DIAGNOSIS — Z923 Personal history of irradiation: Secondary | ICD-10-CM | POA: Diagnosis not present

## 2019-01-18 DIAGNOSIS — C61 Malignant neoplasm of prostate: Secondary | ICD-10-CM | POA: Insufficient documentation

## 2019-01-18 NOTE — Progress Notes (Signed)
Radiation Oncology Follow up Note  Name: Carlos Barrera   Date:   01/18/2019 MRN:  [id number] DOB: Aug 02, 1940    This 78 y.o. male presents to the clinic today for 10-month follow-up status post radiation therapy to both his prostate and pelvic nodes for Gleason 9 (4+5) adenocarcinoma prostate presenting with a PSA of 7.6.  REFERRING PROVIDER: No ref. provider found  HPI: Patient is a 78 year old male now out 10 months having completed IMRT radiation therapy to his prostate and pelvic nodes for Gleason 9 (4+5) adenocarcinoma the prostate presenting with a PSA of 7.6 seen today in routine follow-up he is doing well.  He specifically denies any increased lower urinary tract symptoms diarrhea.  His PSA is absolutely stable from 7 months ago at less than 0.01.  He is currently on androgen deprivation therapy through the urologist office..  COMPLICATIONS OF TREATMENT: none  FOLLOW UP COMPLIANCE: keeps appointments   PHYSICAL EXAM:  BP 135/75 (BP Location: Left Arm, Patient Position: Sitting)   Pulse 83   Temp (!) 97.3 F (36.3 C) (Tympanic)   Resp 16   Wt 176 lb 6.4 oz (80 kg)   BMI 27.02 kg/m  Well-developed well-nourished patient in NAD. HEENT reveals PERLA, EOMI, discs not visualized.  Oral cavity is clear. No oral mucosal lesions are identified. Neck is clear without evidence of cervical or supraclavicular adenopathy. Lungs are clear to A&P. Cardiac examination is essentially unremarkable with regular rate and rhythm without murmur rub or thrill. Abdomen is benign with no organomegaly or masses noted. Motor sensory and DTR levels are equal and symmetric in the upper and lower extremities. Cranial nerves II through XII are grossly intact. Proprioception is intact. No peripheral adenopathy or edema is identified. No motor or sensory levels are noted. Crude visual fields are within normal range.  RADIOLOGY RESULTS: No current films to review  PLAN: Present time patient is doing well with no  evidence of disease.  I am pleased with his overall progress.  Of asked to see him back in 1 year for follow-up with a PSA prior to his visit.  He continues on androgen deprivation therapy through urology.  Patient knows to call with any concerns.  I would like to take this opportunity to thank you for allowing me to participate in the care of your patient.Noreene Filbert, MD

## 2019-01-18 NOTE — Addendum Note (Signed)
Encounter addended by: Noreene Filbert, MD on: 01/18/2019 12:22 PM  Actions taken: Level of Service modified

## 2019-01-19 ENCOUNTER — Encounter: Payer: Medicare Other | Attending: Psychiatry | Admitting: *Deleted

## 2019-01-19 DIAGNOSIS — R0600 Dyspnea, unspecified: Secondary | ICD-10-CM | POA: Insufficient documentation

## 2019-01-19 DIAGNOSIS — Z87891 Personal history of nicotine dependence: Secondary | ICD-10-CM | POA: Diagnosis not present

## 2019-01-19 NOTE — Progress Notes (Signed)
Daily Session Note  Patient Details  Name: Carlos Barrera MRN: [id number] Date of Birth: 06-Dec-1940 Referring Provider:     Pulmonary Rehab from 12/07/2018 in [facility] Cardiac and Pulmonary Rehab  Referring Provider  [name]      Encounter Date: 01/19/2019  Check In: Session Check In - 01/19/19 1009      Check-In   Supervising physician immediately available to respond to emergencies  See telemetry face sheet for immediately available ER MD    Location  [facility]-Cardiac & Pulmonary Rehab    Staff Present  Renita Papa, RN BSN;Jessica Romeo, MA, RCEP, CCRP, CCET;Amanda Sommer, [place], ACSM CEP, Exercise Physiologist    Virtual Visit  No    Medication changes reported      No    Fall or balance concerns reported     No    Warm-up and Cool-down  Performed on first and last piece of equipment    Resistance Training Performed  Yes    VAD Patient?  No    PAD/SET Patient?  No      Pain Assessment   Currently in Pain?  No/denies          Social History   Tobacco Use  Smoking Status Former Smoker  . Packs/day: 2.00  . Years: 20.00  . Pack years: 40.00  . Types: Cigarettes  . Quit date: 04/20/1980  . Years since quitting: 38.7  Smokeless Tobacco Never Used    Goals Met:  Proper associated with RPD/PD & O2 Sat Independence with exercise equipment Using PLB without cueing & demonstrates good technique Exercise tolerated well No report of cardiac concerns or symptoms Strength training completed today  Goals Unmet:  Not Applicable  Comments: Pt able to follow exercise prescription today without complaint.  Will continue to monitor for progression.    Dr. Emily Filbert is Medical Director for [place] and [facility].

## 2019-01-25 ENCOUNTER — Encounter: Payer: Self-pay | Admitting: *Deleted

## 2019-01-25 DIAGNOSIS — R0600 Dyspnea, unspecified: Secondary | ICD-10-CM

## 2019-01-25 NOTE — Progress Notes (Signed)
Pulmonary Individual Treatment Plan  Patient Details  Name: Carlos Barrera MRN: [id number] Date of Birth: January 24, 1941 Referring Provider:     Pulmonary Rehab from 12/07/2018 in [facility] Cardiac and Pulmonary Rehab  Referring Provider  [name]      Initial Encounter Date:    Pulmonary Rehab from 12/07/2018 in [facility] Cardiac and Pulmonary Rehab  Date  12/07/18      Visit Diagnosis: Dyspnea, unspecified type  Patient's Home Medications on Admission:  Current Outpatient Medications:  .  albuterol (VENTOLIN HFA) 108 (90 Base) MCG/ACT inhaler, Inhale 2 puffs into the lungs every 6 (six) hours as needed for wheezing or shortness of breath., Disp: , Rfl:  .  aspirin EC 81 MG tablet, Take 81 mg by mouth daily., Disp: , Rfl:  .  Calcium Carbonate-Vitamin D 600-400 MG-UNIT tablet, , Disp: , Rfl:  .  cephALEXin (KEFLEX) 500 MG capsule, Take 1 capsule (500 mg total) by mouth 2 (two) times daily. (Patient not taking: Reported on 12/06/2018), Disp: 10 capsule, Rfl: 0 .  cetirizine (ZYRTEC) 10 MG tablet, , Disp: , Rfl:  .  cholecalciferol (VITAMIN D) 25 MCG (1000 UT) tablet, , Disp: , Rfl:  .  ibuprofen (ADVIL,MOTRIN) 800 MG tablet, Take 1 tablet (800 mg total) by mouth every 8 (eight) hours as needed for moderate pain. (Patient not taking: Reported on 12/06/2018), Disp: 15 tablet, Rfl: 0 .  levothyroxine (SYNTHROID, LEVOTHROID) 137 MCG tablet, Take 137 mcg by mouth daily before breakfast., Disp: , Rfl:  .  lisinopril (PRINIVIL,ZESTRIL) 10 MG tablet, Take 10 mg by mouth daily., Disp: , Rfl:  .  MAPAP 325 MG tablet, , Disp: , Rfl:  .  Meloxicam 15 MG TBDP, Take by mouth daily as needed., Disp: , Rfl:  .  omeprazole (PRILOSEC) 20 MG capsule, , Disp: , Rfl:  .  pravastatin (PRAVACHOL) 20 MG tablet, Take 20 mg by mouth at bedtime., Disp: , Rfl:  .  prazosin (MINIPRESS) 5 MG capsule, , Disp: , Rfl:  .  ranitidine (ZANTAC) 150 MG tablet, Take 150 mg by mouth at bedtime., Disp: , Rfl:  .  REFRESH TEARS 0.5  % SOLN, , Disp: , Rfl:  .  sertraline (ZOLOFT) 100 MG tablet, Take 100 mg by mouth at bedtime., Disp: , Rfl:  .  sertraline (ZOLOFT) 50 MG tablet, , Disp: , Rfl:  .  SYMBICORT 160-4.5 MCG/ACT inhaler, , Disp: , Rfl:  .  tamsulosin (FLOMAX) 0.4 MG CAPS capsule, , Disp: , Rfl:  .  tiotropium (SPIRIVA) 18 MCG inhalation capsule, Place 18 mcg into inhaler and inhale every evening., Disp: , Rfl:   Current Facility-Administered Medications:  .  Leuprolide Acetate (6 Month) (LUPRON) injection 45 mg, 45 mg, Intramuscular, Once, Stoioff, Ronda Fairly, MD  Past Medical History: Past Medical History:  Diagnosis Date  . COPD (chronic obstructive pulmonary disease) ([place])   . Hypertension   . Prostate cancer ([place])   . Thyroid disease     Tobacco Use: Social History   Tobacco Use  Smoking Status Former Smoker  . Packs/day: 2.00  . Years: 20.00  . Pack years: 40.00  . Types: Cigarettes  . Quit date: 04/20/1980  . Years since quitting: 38.7  Smokeless Tobacco Never Used    Labs: Recent Review Flowsheet Data    There is no flowsheet data to display.       Pulmonary Assessment Scores: Pulmonary Assessment Scores    Row Name 12/07/18 1316  ADL UCSD   SOB Score total  62     Rest  1     Walk  2     Stairs  5     Bath  2     Dress  2     Shop  2       CAT Score   CAT Score  29       mMRC Score   mMRC Score  3        UCSD: Self-administered rating of dyspnea associated with activities of daily living (ADLs) 6-point scale (0 = "not at all" to 5 = "maximal or unable to do because of breathlessness")  Scoring Scores range from 0 to 120.  Minimally important difference is 5 units  CAT: CAT can identify the health impairment of COPD patients and is better correlated with disease progression.  CAT has a scoring range of zero to 40. The CAT score is classified into four groups of low (less than 10), medium (10 - 20), high (21-30) and very high (31-40) based on the impact level  of disease on health status. A CAT score over 10 suggests significant symptoms.  A worsening CAT score could be explained by an exacerbation, poor medication adherence, poor inhaler technique, or progression of COPD or comorbid conditions.  CAT MCID is 2 points  mMRC: mMRC (Modified Medical Research Council) Dyspnea Scale is used to assess the degree of baseline functional disability in patients of respiratory disease due to dyspnea. No minimal important difference is established. A decrease in score of 1 point or greater is considered a positive change.   Pulmonary Function Assessment:   Exercise Target Goals: Exercise Program Goal: Individual exercise prescription set using results from initial 6 min walk test and THRR while considering  patient's activity barriers and safety.   Exercise Prescription Goal: Initial exercise prescription builds to 30-45 minutes a day of aerobic activity, 2-3 days per week.  Home exercise guidelines will be given to patient during program as part of exercise prescription that the participant will acknowledge.  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 HR  101 bpm     Max Ex. BP  124/58     2 Minute Post BP  112/58       Interval HR   1 Minute HR  92     2 Minute HR   70 patient  holding hands behind back     3 Minute HR  - sig loss     4 Minute HR  60 see previous note - not sure HR accurate     2 Minute Post HR  78     Interval Heart Rate?  Yes       Interval Oxygen   Interval Oxygen?  Yes     Baseline Oxygen Saturation %  96 %     1 Minute Oxygen Saturation %  96 %     1 Minute Liters of Oxygen  0 L     2 Minute Oxygen Saturation %  95 %     2 Minute Liters of Oxygen  0 L     3 Minute Oxygen Saturation %  96 %     3 Minute Liters of Oxygen  0 L     4 Minute Oxygen Saturation %  95 %     4 Minute Liters of Oxygen  0 L     2 Minute Post Oxygen Saturation %  96 %     2 Minute Post Liters of Oxygen  0 L       Oxygen Initial Assessment: Oxygen Initial Assessment - 01/10/19 1320      Home Oxygen   Home Oxygen Device  --    Sleep Oxygen Prescription  --    Home Exercise Oxygen Prescription  --    Home at Rest Exercise Oxygen Prescription  --      Program Oxygen Prescription   Program Oxygen Prescription  --      Intervention   Short Term Goals  --    Long  Term Goals  --       Oxygen Re-Evaluation: Oxygen Re-Evaluation    Row Name 01/10/19 1345             Program Oxygen Prescription   Program Oxygen Prescription  None         Home Oxygen   Home Oxygen Device  None       Sleep Oxygen Prescription  None       Home Exercise Oxygen Prescription  None       Home at Rest Exercise Oxygen Prescription  None         Goals/Expected Outcomes   Short Term Goals  To learn and demonstrate proper use of respiratory medications;To learn and demonstrate proper pursed lip breathing techniques or other breathing techniques.;To learn and understand importance of maintaining oxygen saturations>88%;To learn and understand importance of monitoring SPO2 with pulse oximeter and demonstrate accurate use of the pulse oximeter.       Long  Term Goals  Verbalizes importance of monitoring SPO2 with pulse oximeter and return demonstration;Maintenance of O2  saturations>88%;Exhibits proper breathing techniques, such as pursed lip breathing or other method taught during program session;Compliance with respiratory medication;Demonstrates proper use of MDI's       Comments  Patient states that his respiratory medications other than Albuterol are not helping him. Informed him to speak with his pulmonologist about possible going over his medications and seeing if he can get something to help with his breathing. He feels like nothing is working for his mantainence drugs.       Goals/Expected Outcomes  Short: Speak with pulmonologist about medications. Long: take medications prescribed properly.          Oxygen Discharge (Final Oxygen Re-Evaluation): Oxygen Re-Evaluation - 01/10/19 1345      Program Oxygen Prescription  Program Oxygen Prescription  None      Home Oxygen   Home Oxygen Device  None    Sleep Oxygen Prescription  None    Home Exercise Oxygen Prescription  None    Home at Rest Exercise Oxygen Prescription  None      Goals/Expected Outcomes   Short Term Goals  To learn and demonstrate proper use of respiratory medications;To learn and demonstrate proper pursed lip breathing techniques or other breathing techniques.;To learn and understand importance of maintaining oxygen saturations>88%;To learn and understand importance of monitoring SPO2 with pulse oximeter and demonstrate accurate use of the pulse oximeter.    Long  Term Goals  Verbalizes importance of monitoring SPO2 with pulse oximeter and return demonstration;Maintenance of O2 saturations>88%;Exhibits proper breathing techniques, such as pursed lip breathing or other method taught during program session;Compliance with respiratory medication;Demonstrates proper use of MDI's    Comments  Patient states that his respiratory medications other than Albuterol are not helping him. Informed him to speak with his pulmonologist about possible going over his medications and seeing if he can get  something to help with his breathing. He feels like nothing is working for his mantainence drugs.    Goals/Expected Outcomes  Short: Speak with pulmonologist about medications. Long: take medications prescribed properly.       Initial Exercise Prescription: Initial Exercise Prescription - 12/07/18 1300      Date of Initial Exercise RX and Referring Provider   Date  12/07/18    Referring Provider  [name]      Treadmill   MPH  1    Grade  0    Minutes  15   1 min then rest   METs  1.2      NuStep   Level  1    SPM  80    Minutes  15    METs  1.2      Arm Ergometer   Level  1    RPM  50    Minutes  15    METs  1.2      Biostep-RELP   Level  1    SPM  50    Minutes  15    METs  2      Prescription Details   Frequency (times per week)  2    Duration  Progress to 30 minutes of continuous aerobic without signs/symptoms of physical distress      Intensity   THRR 40-80% of Max Heartrate  104-130    Ratings of Perceived Exertion  11-13    Perceived Dyspnea  0-4      Progression   Progression  Continue to progress workloads to maintain intensity without signs/symptoms of physical distress.      Resistance Training   Training Prescription  Yes    Weight  3 lb    Reps  10-15       Perform Capillary Blood Glucose checks as needed.  Exercise Prescription Changes: Exercise Prescription Changes    Row Name 12/07/18 1300 12/20/18 1500 01/06/19 0700 01/17/19 1400       Response to Exercise   Blood Pressure (Admit)  106/48  112/68  126/56  142/64    Blood Pressure (Exercise)  124/58  128/60  130/60  128/64    Blood Pressure (Exit)  112/58  94/68  110/54  120/64    Heart Rate (Admit)  101 bpm  95 bpm  75 bpm  64 bpm    Heart  Rate (Exercise)  92 bpm  88 bpm  86 bpm  84 bpm    Heart Rate (Exit)  78 bpm  80 bpm  72 bpm  72 bpm    Oxygen Saturation (Admit)  96 %  97 %  94 %  94 %    Oxygen Saturation (Exercise)  95 %  93 %  94 %  94 %    Oxygen Saturation (Exit)   96 %  98 %  97 %  97 %    Rating of Perceived Exertion (Exercise)  '17  11  15  11    ' Perceived Dyspnea (Exercise)  '4  2  3  3    ' Symptoms  none  none  none  none    Duration  Progress to 30 minutes of  aerobic without signs/symptoms of physical distress  Progress to 30 minutes of  aerobic without signs/symptoms of physical distress  Continue with 30 min of aerobic exercise without signs/symptoms of physical distress.  Continue with 30 min of aerobic exercise without signs/symptoms of physical distress.    Intensity  -  -  -  THRR unchanged      Progression   Progression  Continue to progress workloads to maintain intensity without signs/symptoms of physical distress.  Continue to progress workloads to maintain intensity without signs/symptoms of physical distress.  Continue to progress workloads to maintain intensity without signs/symptoms of physical distress.  Continue to progress workloads to maintain intensity without signs/symptoms of physical distress.    Average METs  -  -  -  2.5      Resistance Training   Training Prescription  -  Yes  Yes  Yes    Weight  -  3 lb  3 lb  3 lbs    Reps  -  10-15  10-15  10-15      Interval Training   Interval Training  -  -  -  No      NuStep   Level  -  '4  3  3    ' SPM  -  80  80  -    Minutes  -  '15  15  30    ' METs  -  2  3.1  3.1      Biostep-RELP   Level  -  -  1  1    SPM  -  -  50  -    Minutes  -  -  30  30    METs  -  -  2  2      Home Exercise Plan   Plans to continue exercise at  -  -  -  Home (comment) walking    Frequency  -  -  -  Add 2 additional days to program exercise sessions.    Initial Home Exercises Provided  -  -  -  12/27/18       Exercise Comments: Exercise Comments    Row Name 12/13/18 1302 12/27/18 1056         Exercise Comments  First full day of exercise!  Patient was oriented to gym and equipment including functions, settings, policies, and procedures.  Patient's individual exercise prescription and  treatment plan were reviewed.  All starting workloads were established based on the results of the 6 minute walk test done at initial orientation visit.  The plan for exercise progression was also introduced and progression will be customized based on patient's  performance and goals.  Reviewed home exercise with pt today.  Pt plans to walk and look into [place] for exercise.  Reviewed THR, pulse, RPE, sign and symptoms, NTG use, and when to call 911 or MD.  Also discussed weather considerations and indoor options.  Pt voiced understanding.  Buds shoulder has been sore since last session so he skipped the Arm crank today.         Exercise Goals and Review: Exercise Goals    Row Name 12/07/18 1309             Exercise Goals   Increase Physical Activity  Yes       Intervention  Provide advice, education, support and counseling about physical activity/exercise needs.;Develop an individualized exercise prescription for aerobic and resistive training based on initial evaluation findings, risk stratification, comorbidities and participant's personal goals.       Expected Outcomes  Short Term: Attend rehab on a regular basis to increase amount of physical activity.;Long Term: Add in home exercise to make exercise part of routine and to increase amount of physical activity.;Long Term: Exercising regularly at least 3-5 days a week.       Increase Strength and Stamina  Yes       Intervention  Provide advice, education, support and counseling about physical activity/exercise needs.;Develop an individualized exercise prescription for aerobic and resistive training based on initial evaluation findings, risk stratification, comorbidities and participant's personal goals.       Expected Outcomes  Short Term: Increase workloads from initial exercise prescription for resistance, speed, and METs.;Short Term: Perform resistance training exercises routinely during rehab and add in resistance training at home;Long  Term: Improve cardiorespiratory fitness, muscular endurance and strength as measured by increased METs and functional capacity (6MWT)       Able to understand and use rate of perceived exertion (RPE) scale  Yes       Intervention  Provide education and explanation on how to use RPE scale       Expected Outcomes  Short Term: Able to use RPE daily in rehab to express subjective intensity level;Long Term:  Able to use RPE to guide intensity level when exercising independently       Able to understand and use Dyspnea scale  Yes       Intervention  Provide education and explanation on how to use Dyspnea scale       Expected Outcomes  Short Term: Able to use Dyspnea scale daily in rehab to express subjective sense of shortness of breath during exertion;Long Term: Able to use Dyspnea scale to guide intensity level when exercising independently       Knowledge and understanding of Target Heart Rate Range (THRR)  Yes       Intervention  Provide education and explanation of THRR including how the numbers were predicted and where they are located for reference       Expected Outcomes  Short Term: Able to state/look up THRR;Short Term: Able to use daily as guideline for intensity in rehab;Long Term: Able to use THRR to govern intensity when exercising independently       Able to check pulse independently  Yes       Intervention  Provide education and demonstration on how to check pulse in carotid and radial arteries.;Review the importance of being able to check your own pulse for safety during independent exercise       Expected Outcomes  Short Term: Able to explain why pulse checking  is important during independent exercise;Long Term: Able to check pulse independently and accurately       Understanding of Exercise Prescription  Yes       Intervention  Provide education, explanation, and written materials on patient's individual exercise prescription       Expected Outcomes  Short Term: Able to explain program  exercise prescription;Long Term: Able to explain home exercise prescription to exercise independently          Exercise Goals Re-Evaluation : Exercise Goals Re-Evaluation    Row Name 12/13/18 1303 12/20/18 1545 12/27/18 1056 01/06/19 0736 01/17/19 1410     Exercise Goal Re-Evaluation   Exercise Goals Review  Increase Physical Activity;Increase Strength and Stamina;Able to understand and use rate of perceived exertion (RPE) scale;Able to understand and use Dyspnea scale;Knowledge and understanding of Target Heart Rate Range (THRR);Able to check pulse independently;Understanding of Exercise Prescription  Increase Physical Activity;Increase Strength and Stamina;Able to understand and use rate of perceived exertion (RPE) scale;Able to understand and use Dyspnea scale;Knowledge and understanding of Target Heart Rate Range (THRR);Able to check pulse independently;Understanding of Exercise Prescription  Increase Physical Activity;Increase Strength and Stamina;Able to understand and use rate of perceived exertion (RPE) scale;Knowledge and understanding of Target Heart Rate Range (THRR);Able to check pulse independently;Understanding of Exercise Prescription  Increase Physical Activity;Able to understand and use rate of perceived exertion (RPE) scale;Increase Strength and Stamina;Able to understand and use Dyspnea scale;Knowledge and understanding of Target Heart Rate Range (THRR);Able to check pulse independently;Understanding of Exercise Prescription  Increase Physical Activity;Increase Strength and Stamina;Understanding of Exercise Prescription   Comments  Reviewed RPE scale, THR and program prescription with pt today.  Pt voiced understanding and was given a copy of goals to take home.  Buds knee gives him a lot of trouble with exercise and he only uses one leg a lot of times.  he has progressed workloads and is reporting less SOB with exercise.  he started with working for 1-2 minutes and resting and is  building up from there.  Reviewed home exercise with pt today.  Pt plans to walk and look into [facility] for exercise.  Reviewed THR, pulse, RPE, sign and symptoms, NTG use, and when to call 911 or MD.  Also discussed weather considerations and indoor options.  Pt voiced understanding.  [name] attends consistently and works in correct RPE range.  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  This EP discussed a PT consult for his shoulder as he has noticed it hurts other times at home.  He can do 30 min on NS and Bio without rest.  [name] has been doing well in rehab.  He is up to level 3 on the NuStep!  We will continue to monitor his progress.   Expected Outcomes  Short: Use RPE daily to regulate intensity. Long: Follow program prescription in THR.  Short - complete 15 min without resting Long - complete 30 min without rest  -  Short - continue to attend consistently Long - increase overal MET level  Short: Increase workload on BioStep.  Long: Continue to improve stamina.      Discharge Exercise Prescription (Final Exercise Prescription Changes): Exercise Prescription Changes - 01/17/19 1400      Response to Exercise   Blood Pressure (Admit)  142/64    Blood Pressure (Exercise)  128/64    Blood Pressure (Exit)  120/64    Heart Rate (Admit)  64 bpm  Heart Rate (Exercise)  84 bpm    Heart Rate (Exit)  72 bpm    Oxygen Saturation (Admit)  94 %    Oxygen Saturation (Exercise)  94 %    Oxygen Saturation (Exit)  97 %    Rating of Perceived Exertion (Exercise)  11    Perceived Dyspnea (Exercise)  3    Symptoms  none    Duration  Continue with 30 min of aerobic exercise without signs/symptoms of physical distress.    Intensity  THRR unchanged      Progression   Progression  Continue to progress workloads to maintain intensity without signs/symptoms of physical distress.    Average METs  2.5      Resistance Training   Training Prescription  Yes    Weight  3 lbs     Reps  10-15      Interval Training   Interval Training  No      NuStep   Level  3    Minutes  30    METs  3.1      Biostep-RELP   Level  1    Minutes  30    METs  2      Home Exercise Plan   Plans to continue exercise at  Home (comment)   walking   Frequency  Add 2 additional days to program exercise sessions.    Initial Home Exercises Provided  12/27/18       Nutrition:  Target Goals: Understanding of nutrition guidelines, daily intake of sodium <1511m, cholesterol <2056m calories 30% from fat and 7% or less from saturated fats, daily to have 5 or more servings of fruits and vegetables.  Biometrics: Pre Biometrics - 12/07/18 1310      Pre Biometrics   Height  5' 7.75" (1.721 m)    Weight  178 lb 12.8 oz (81.1 kg)    BMI (Calculated)  27.38        Nutrition Therapy Plan and Nutrition Goals: Nutrition Therapy & Goals - 12/07/18 1236      Nutrition Therapy   Diet  low Na, HH diet    Protein (specify units)  65-70g    Fiber  30 grams    Whole Grain Foods  3 servings    Saturated Fats  12 max. grams    Fruits and Vegetables  5 servings/day    Sodium  1.5 grams      Personal Nutrition Goals   Nutrition Goal  ST: add protein foods to B (when he has it) and protein to snack after lunch LT: lose 5 lbs and limit SOB    Comments  Pt reports eating breakfast sometimes (OJ, muffin, sometimes yogurt). Takes out [employer] lunch with veggies and salad and meat - sometimes potatoes. Pt will have small snack of fruit with whipped cream. Discussed maybe adding peanut butter or yogurt to add protein. Disucssed HH, low Na eating and higher needs. Pt reports having a balanced relationship with food.      Intervention Plan   Intervention  Prescribe, educate and counsel regarding individualized specific dietary modifications aiming towards targeted core components such as weight, hypertension, lipid management, diabetes, heart failure and other comorbidities.;Nutrition  handout(s) given to patient.    Expected Outcomes  Short Term Goal: Understand basic principles of dietary content, such as calories, fat, sodium, cholesterol and nutrients.;Short Term Goal: A plan has been developed with personal nutrition goals set during dietitian appointment.;Long Term Goal: Adherence to prescribed  nutrition plan.       Nutrition Assessments:   Nutrition Goals Re-Evaluation: Nutrition Goals Re-Evaluation    [place] Name 01/10/19 1059             Goals   Nutrition Goal  ST: add protein foods to B (when he has it) and protein to snack after lunch LT: lose 5 lbs and limit SOB       Comment  Pt reports feeling like walking is hard with pain and strength - mentioned how protein can help him build muslce so that he can increase his strength and mobility. Pt now consistently having breakfast " grain mix".       Expected Outcome  ST: add protein foods to B (when he has it) and protein to snack after lunch LT: lose 5 lbs and limit SOB          Nutrition Goals Discharge (Final Nutrition Goals Re-Evaluation): Nutrition Goals Re-Evaluation - 01/10/19 1059      Goals   Nutrition Goal  ST: add protein foods to B (when he has it) and protein to snack after lunch LT: lose 5 lbs and limit SOB    Comment  Pt reports feeling like walking is hard with pain and strength - mentioned how protein can help him build muslce so that he can increase his strength and mobility. Pt now consistently having breakfast " grain mix".    Expected Outcome  ST: add protein foods to B (when he has it) and protein to snack after lunch LT: lose 5 lbs and limit SOB       Psychosocial: Target Goals: Acknowledge presence or absence of significant depression and/or stress, maximize coping skills, provide positive support system. Participant is able to verbalize types and ability to use techniques and skills needed for reducing stress and depression.   Initial Review & Psychosocial Screening: Initial Psych  Review & Screening - 12/06/18 1117      Initial Review   Current issues with  Current Stress Concerns;Current Anxiety/Panic    Source of Stress Concerns  Poor Coping Skills;Chronic Illness    Comments  PTSD on Zoloft, not currently seeing counselor, does not like being approached from behind, unexpected noises      [place]?  Yes    Comments  Church family, good close neighbors, son lives nearby      Barriers   Psychosocial barriers to participate in program  The patient should benefit from training in stress management and relaxation.;Psychosocial barriers identified (see note)      Screening Interventions   Interventions  Encouraged to exercise;Program counselor consult;To provide support and resources with identified psychosocial needs;Provide feedback about the scores to participant    Expected Outcomes  Short Term goal: Utilizing psychosocial counselor, staff and physician to assist with identification of specific Stressors or current issues interfering with healing process. Setting desired goal for each stressor or current issue identified.;Long Term Goal: Stressors or current issues are controlled or eliminated.;Short Term goal: Identification and review with participant of any Quality of Life or Depression concerns found by scoring the questionnaire.;Long Term goal: The participant improves quality of Life and PHQ9 Scores as seen by post scores and/or verbalization of changes       Quality of Life Scores:  Scores of 19 and below usually indicate a poorer quality of life in these areas.  A difference of  2-3 points is a clinically meaningful difference.  A difference of 2-3 points  in the total score of the Quality of Life Index has been associated with significant improvement in overall quality of life, self-image, physical symptoms, and general health in studies assessing change in quality of life.  PHQ-9: Recent Review Flowsheet Data    Depression  screen [facility] 2/9 01/03/2019 12/07/2018   Decreased Interest 0 0   Down, Depressed, Hopeless 0 0   PHQ - 2 Score 0 0   Altered sleeping 2 3   Tired, decreased energy 1 2   Change in appetite 0 1   Feeling bad or failure about yourself  0 0   Trouble concentrating 2 0   Moving slowly or fidgety/restless 0 3   Suicidal thoughts 0 0   PHQ-9 Score 5 9   Difficult doing work/chores Not difficult at all Somewhat difficult     Interpretation of Total Score  Total Score Depression Severity:  1-4 = Minimal depression, 5-9 = Mild depression, 10-14 = Moderate depression, 15-19 = Moderately severe depression, 20-27 = Severe depression   Psychosocial Evaluation and Intervention:   Psychosocial Re-Evaluation: Psychosocial Re-Evaluation    [place] Name 01/03/19 1116 01/12/19 1040           Psychosocial Re-Evaluation   Current issues with  -  Current Stress Concerns;Current Anxiety/Panic      Comments  PHQ 9 repeated today  Score was 5  He states that his PTSD is under controll. Exercise is helping him somewhat to keep his stress down. He has a positive attitude in class and is willing to stick it out until the end.      Expected Outcomes  -  Short:continue to attend [facility]. Long: maintain exercise to keep stress at a minimum.      Interventions  -  Encouraged to attend Pulmonary Rehabilitation for the exercise      Continue Psychosocial Services   -  Follow up required by staff         Psychosocial Discharge (Final Psychosocial Re-Evaluation): Psychosocial Re-Evaluation - 01/12/19 1040      Psychosocial Re-Evaluation   Current issues with  Current Stress Concerns;Current Anxiety/Panic    Comments  He states that his PTSD is under controll. Exercise is helping him somewhat to keep his stress down. He has a positive attitude in class and is willing to stick it out until the end.    Expected Outcomes  Short:continue to attend [facility]. Long: maintain exercise to keep stress at a minimum.     Interventions  Encouraged to attend Pulmonary Rehabilitation for the exercise    Continue Psychosocial Services   Follow up required by staff       Education: Education Goals: Education classes will be provided on a weekly basis, covering required topics. Participant will state understanding/return demonstration of topics presented.  Learning Barriers/Preferences: Learning Barriers/Preferences - 12/06/18 1115      Learning Barriers/Preferences   Learning Barriers  Sight;Hearing;Exercise Concerns   wears glasses and hearing aid, wears knee brace   Learning Preferences  Individual Instruction;Written Material       Education Topics:  Initial Evaluation Education: - Verbal, written and demonstration of respiratory meds, oximetry and breathing techniques. Instruction on use of nebulizers and MDIs and importance of monitoring MDI activations.   General Nutrition Guidelines/Fats and Fiber: -Group instruction provided by verbal, written material, models and posters to present the general guidelines for heart healthy nutrition. Gives an explanation and review of dietary fats and fiber.   Controlling Sodium/Reading Food Labels: -Group verbal and  written material supporting the discussion of sodium use in heart healthy nutrition. Review and explanation with models, verbal and written materials for utilization of the food label.   Exercise Physiology & General Exercise Guidelines: - Group verbal and written instruction with models to review the exercise physiology of the cardiovascular system and associated critical values. Provides general exercise guidelines with specific guidelines to those with heart or lung disease.    Aerobic Exercise & Resistance Training: - Gives group verbal and written instruction on the various components of exercise. Focuses on aerobic and resistive training programs and the benefits of this training and how to safely progress through these  programs.   Flexibility, Balance, Mind/Body Relaxation: Provides group verbal/written instruction on the benefits of flexibility and balance training, including mind/body exercise modes such as yoga, pilates and tai chi.  Demonstration and skill practice provided.   Stress and Anxiety: - Provides group verbal and written instruction about the health risks of elevated stress and causes of high stress.  Discuss the correlation between heart/lung disease and anxiety and treatment options. Review healthy ways to manage with stress and anxiety.   Depression: - Provides group verbal and written instruction on the correlation between heart/lung disease and depressed mood, treatment options, and the stigmas associated with seeking treatment.   Exercise & Equipment Safety: - Individual verbal instruction and demonstration of equipment use and safety with use of the equipment.   Pulmonary Rehab from 12/07/2018 in [facility] Cardiac and Pulmonary Rehab  Date  12/07/18  Educator  AS  Instruction Review Code  1- Verbalizes Understanding      Infection Prevention: - Provides verbal and written material to individual with discussion of infection control including proper hand washing and proper equipment cleaning during exercise session.   Pulmonary Rehab from 12/07/2018 in [facility] Cardiac and Pulmonary Rehab  Date  12/07/18  Educator  AS  Instruction Review Code  1- Verbalizes Understanding      Falls Prevention: - Provides verbal and written material to individual with discussion of falls prevention and safety.   Pulmonary Rehab from 12/07/2018 in [facility] Cardiac and Pulmonary Rehab  Date  12/07/18  Educator  AS  Instruction Review Code  1- Verbalizes Understanding      Diabetes: - Individual verbal and written instruction to review signs/symptoms of diabetes, desired ranges of glucose level fasting, after meals and with exercise. Advice that pre and post exercise glucose checks will be done for 3  sessions at entry of program.   Chronic Lung Diseases: - Group verbal and written instruction to review updates, respiratory medications, advancements in procedures and treatments. Discuss use of supplemental oxygen including available portable oxygen systems, continuous and intermittent flow rates, concentrators, personal use and safety guidelines. Review proper use of inhaler and spacers. Provide informative websites for self-education.    Energy Conservation: - Provide group verbal and written instruction for methods to conserve energy, plan and organize activities. Instruct on pacing techniques, use of adaptive equipment and posture/positioning to relieve shortness of breath.   Triggers and Exacerbations: - Group verbal and written instruction to review types of environmental triggers and ways to prevent exacerbations. Discuss weather changes, air quality and the benefits of nasal washing. Review warning signs and symptoms to help prevent infections. Discuss techniques for effective airway clearance, coughing, and vibrations.   AED/CPR: - Group verbal and written instruction with the use of models to demonstrate the basic use of the AED with the basic ABC's of resuscitation.   Anatomy and  Physiology of the Lungs: - Group verbal and written instruction with the use of models to provide basic lung anatomy and physiology related to function, structure and complications of lung disease.   Anatomy & Physiology of the Heart: - Group verbal and written instruction and models provide basic cardiac anatomy and physiology, with the coronary electrical and arterial systems. Review of Valvular disease and Heart Failure   Cardiac Medications: - Group verbal and written instruction to review commonly prescribed medications for heart disease. Reviews the medication, class of the drug, and side effects.   Know Your Numbers and Risk Factors: -Group verbal and written instruction about important  numbers in your health.  Discussion of what are risk factors and how they play a role in the disease process.  Review of Cholesterol, Blood Pressure, Diabetes, and BMI and the role they play in your overall health.   Sleep Hygiene: -Provides group verbal and written instruction about how sleep can affect your health.  Define sleep hygiene, discuss sleep cycles and impact of sleep habits. Review good sleep hygiene tips.    Other: -Provides group and verbal instruction on various topics (see comments)    Knowledge Questionnaire Score: Knowledge Questionnaire Score - 12/07/18 1322      Knowledge Questionnaire Score   Pre Score  14/18        Core Components/Risk Factors/Patient Goals at Admission: Personal Goals and Risk Factors at Admission - 12/07/18 1311      Core Components/Risk Factors/Patient Goals on Admission    Weight Management  Yes;Weight Maintenance    Intervention  Weight Management: Develop a combined nutrition and exercise program designed to reach desired caloric intake, while maintaining appropriate intake of nutrient and fiber, sodium and fats, and appropriate energy expenditure required for the weight goal.;Weight Management: Provide education and appropriate resources to help participant work on and attain dietary goals.    Admit Weight  178 lb 12.8 oz (81.1 kg)    Goal Weight: Short Term  168 lb (76.2 kg)    Goal Weight: Long Term  158 lb (71.7 kg)    Expected Outcomes  Short Term: Continue to assess and modify interventions until short term weight is achieved;Long Term: Adherence to nutrition and physical activity/exercise program aimed toward attainment of established weight goal    Intervention  Provide education on lifestyle modifcations including regular physical activity/exercise, weight management, moderate sodium restriction and increased consumption of fresh fruit, vegetables, and low fat dairy, alcohol moderation, and smoking cessation.;Monitor prescription  use compliance.    Expected Outcomes  Short Term: Continued assessment and intervention until BP is < 140/68m HG in hypertensive participants. < 130/839mHG in hypertensive participants with diabetes, heart failure or chronic kidney disease.;Long Term: Maintenance of blood pressure at goal levels.    Intervention  Provide education and support for participant on nutrition & aerobic/resistive exercise along with prescribed medications to achieve LDL <7052mHDL >22m73m  Expected Outcomes  Short Term: Participant states understanding of desired cholesterol values and is compliant with medications prescribed. Participant is following exercise prescription and nutrition guidelines.;Long Term: Cholesterol controlled with medications as prescribed, with individualized exercise RX and with personalized nutrition plan. Value goals: LDL < 70mg46mL > 40 mg.       Core Components/Risk Factors/Patient Goals Review:  Goals and Risk Factor Review    Row Name 01/12/19 1043             Core Components/Risk Factors/Patient Goals Review   Personal Goals Review  Weight Management/Obesity;Hypertension;Lipids       Review  Patient wasnt to lose a few pounds. He wants to lose around 10 pounds. His goal is to reach 170 pounds. He does not check his blood pressure at home and he has a machine. informed him to check his blood pressure at home when he wakes up. He does noit have a pulse oximeter and was informed to get one. He states he can get one through his insurance.       Expected Outcomes  Short: check blood pressure at home daily. Obtain a pulse oximeter. Long: maintain blood pressure and oxygen reading at home independently.          Core Components/Risk Factors/Patient Goals at Discharge (Final Review):  Goals and Risk Factor Review - 01/12/19 1043      Core Components/Risk Factors/Patient Goals Review   Personal Goals Review  Weight Management/Obesity;Hypertension;Lipids    Review  Patient wasnt to lose  a few pounds. He wants to lose around 10 pounds. His goal is to reach 170 pounds. He does not check his blood pressure at home and he has a machine. informed him to check his blood pressure at home when he wakes up. He does noit have a pulse oximeter and was informed to get one. He states he can get one through his insurance.    Expected Outcomes  Short: check blood pressure at home daily. Obtain a pulse oximeter. Long: maintain blood pressure and oxygen reading at home independently.       ITP Comments: ITP Comments    Row Name 12/06/18 1134 12/07/18 1259 12/28/18 0611 01/10/19 1316 01/25/19 1335   ITP Comments  Virtual Orientation completed.  Documentation can be found in [place] paperwork linked to encounter in media section.  Original referall was faxed on 11/16/18.  Completed initial 6MWT and nutrition eval.  Initial ITP created and sent to review to Dr Sabra Heck  30 Day review. Continue with ITP unless directed changes per Medical Director review.  Patient states that he was not sure about an event that happened at home. He states that he had a muscle soreness or burn in his chest. He states that it was not painful, or heartburn but something he realized.Informed him to make note if it happens again write down what he was doing before hand.  30 day review completed. ITP sent to Dr. Emily Filbert, Medical Director of Cardiac and Pulmonary Rehab. Continue with ITP unless changes are made by physician.  Department closed starting 10/2 until further notice by infection prevention and Health at Work teams for [place].      Comments: 30 day review

## 2019-01-30 ENCOUNTER — Ambulatory Visit (INDEPENDENT_AMBULATORY_CARE_PROVIDER_SITE_OTHER): Payer: Medicare Other

## 2019-01-30 ENCOUNTER — Other Ambulatory Visit: Payer: Self-pay

## 2019-01-30 ENCOUNTER — Encounter: Payer: Self-pay | Admitting: Orthopedic Surgery

## 2019-01-30 ENCOUNTER — Ambulatory Visit (INDEPENDENT_AMBULATORY_CARE_PROVIDER_SITE_OTHER): Payer: Medicare Other | Admitting: Orthopedic Surgery

## 2019-01-30 DIAGNOSIS — M25562 Pain in left knee: Secondary | ICD-10-CM

## 2019-01-30 DIAGNOSIS — M1712 Unilateral primary osteoarthritis, left knee: Secondary | ICD-10-CM

## 2019-01-31 ENCOUNTER — Encounter: Payer: Medicare Other | Admitting: *Deleted

## 2019-01-31 DIAGNOSIS — R0600 Dyspnea, unspecified: Secondary | ICD-10-CM | POA: Diagnosis not present

## 2019-01-31 NOTE — Progress Notes (Signed)
Daily Session Note  Patient Details  Name: EDDI HYMES MRN: [id number] Date of Birth: 12-11-1940 Referring Provider:     Pulmonary Rehab from 12/07/2018 in [facility] Cardiac and Pulmonary Rehab  Referring Provider  [name]      Encounter Date: 01/31/2019  Check In: Session Check In - 01/31/19 1024      Check-In   Supervising physician immediately available to respond to emergencies  See telemetry face sheet for immediately available ER MD    Location  [facility]-Cardiac & Pulmonary Rehab    Staff Present  Heath Lark, RN, BSN, CCRP;Laureen Owens Shark, BS, RRT, CPFT;Joseph Stanwood [employer]    Virtual Visit  No    Medication changes reported      No    Fall or balance concerns reported     No    Warm-up and Cool-down  Performed on first and last piece of equipment    Resistance Training Performed  Yes    VAD Patient?  No    PAD/SET Patient?  No      Pain Assessment   Currently in Pain?  No/denies          Social History   Tobacco Use  Smoking Status Former Smoker  . Packs/day: 2.00  . Years: 20.00  . Pack years: 40.00  . Types: Cigarettes  . Quit date: 04/20/1980  . Years since quitting: 38.8  Smokeless Tobacco Never Used    Goals Met:  Independence with exercise equipment Exercise tolerated well No report of cardiac concerns or symptoms  Goals Unmet:  Not Applicable  Comments: Pt able to follow exercise prescription today without complaint.  Will continue to monitor for progression.    Dr. Emily Filbert is Medical Director for [place] and [facility]monary [facility].

## 2019-02-02 ENCOUNTER — Other Ambulatory Visit: Payer: Self-pay

## 2019-02-02 ENCOUNTER — Encounter: Payer: Medicare Other | Admitting: *Deleted

## 2019-02-02 DIAGNOSIS — R0600 Dyspnea, unspecified: Secondary | ICD-10-CM | POA: Diagnosis not present

## 2019-02-02 NOTE — Progress Notes (Signed)
Daily Session Note  Patient Details  Name: KIP CROPP MRN: [id number] Date of Birth: 05/09/1940 Referring Provider:     Pulmonary Rehab from 12/07/2018 in [facility] Cardiac and Pulmonary Rehab  Referring Provider  [place]      Encounter Date: 02/02/2019  Check In: Session Check In - 02/02/19 1019      Check-In   Supervising physician immediately available to respond to emergencies  See telemetry face sheet for immediately available ER MD    Location  [facility]-Cardiac & Pulmonary Rehab    Staff Present  Heath Lark, RN, BSN, CCRP;Joseph Hood RCP,RRT,BSRT;Jessica [place], [place], [place] [place], CCET    Virtual Visit  No    Medication changes reported      No    Fall or balance concerns reported     No    Warm-up and Cool-down  Performed on first and last piece of equipment    Resistance Training Performed  Yes    VAD Patient?  No    PAD/SET Patient?  No      Pain Assessment   Currently in Pain?  No/denies          Social History   Tobacco Use  Smoking Status Former Smoker  . Packs/day: 2.00  . Years: 20.00  . Pack years: 40.00  . Types: Cigarettes  . Quit date: 04/20/1980  . Years since quitting: 38.8  Smokeless Tobacco Never Used    Goals Met:  Independence with exercise equipment Exercise tolerated well No report of cardiac concerns or symptoms  Goals Unmet:  Not Applicable  Comments: Pt able to follow exercise prescription today without complaint.  Will continue to monitor for progression.    Dr. Emily Filbert is Medical Director for [facility] and [facility]monary [facility].

## 2019-02-04 NOTE — Progress Notes (Signed)
Office Visit Note   Patient: Carlos Barrera           Date of Birth: 1940-11-21           MRN: [id number] Visit Date: 01/30/2019 Requested by: No referring provider defined for this encounter. PCP: Patient, No Pcp Per  Subjective: Chief Complaint  Patient presents with  . Left Knee - Pain    HPI: Carlos Barrera is a patient with left knee pain for 2 years duration.  Denies any history of fall or injury.  States it hurts him to walk and bend.  He does wear a brace.  Not taking any pain medicine.  Had gel injection 2 years ago.  First 1 was helpful second 1 was not.  He can walk less than 1 block because of knee pain as well as mild to moderate COPD.  He does have pulmonary rehab appointment.  Does not have any stairs at home.  Has church family that can help take care of him.  No personal or family history of pulmonary embolism or DVT.  No groin pain.  Does have a history of back surgeries x2.  Has not had any recurrent radicular symptoms.  The pain does keep him awake at night.  He would like total knee replacement.             ROS: All systems reviewed are negative as they relate to the chief complaint within the history of present illness.  Patient denies  fevers or chills.   Assessment & Plan: Visit Diagnoses:  1. Acute pain of left knee   2. Arthritis of left knee     Plan: Impression is left knee arthritis.  This is been refractory to nonoperative management.  Has pain localizing to the medial aspect of the knee.  He also reports generally global pain within the patellofemoral and lateral compartments.  We discussed partial versus total knee replacement but I think total knee replacement would be the safest option for him based on his global pain.  Radiographically the medial compartment is affected the most.  Discussed with him other nonoperative treatment options such as continued injections and leg strengthening but the amount of pain he is having particularly night pain and pain with ADLs  is enough that he wants to go ahead and get the knee replaced.  It is making a significant impact on his quality of life.  The risk and benefits of knee replacement are discussed with the patient including but not limited to infection nerve vessel damage knee stiffness as well as extensive rehabilitative process required.  Patient understands risk and benefits and wishes to proceed.  Anticipate he may need potentially skilled nursing home stay depending on how he progresses following surgery.  Otherwise he may be able to manage with his extended church family.  All questions answered.  Follow-Up Instructions: No follow-ups on file.   Orders:  Orders Placed This Encounter  Procedures  . XR KNEE 3 VIEW LEFT   No orders of the defined types were placed in this encounter.     Procedures: No procedures performed   Clinical Data: No additional findings.  Objective: Vital Signs: There were no vitals taken for this visit.  Physical Exam:   Constitutional: Patient appears well-developed HEENT:  Head: Normocephalic Eyes:EOM are normal Neck: Normal range of motion Cardiovascular: Normal rate Pulmonary/chest: Effort normal Neurologic: Patient is alert Skin: Skin is warm Psychiatric: Patient has normal mood and affect    Ortho Exam: Ortho  exam demonstrates antalgic gait to the left with palpable pedal pulses.  No groin pain with internal/external rotation of the left or right leg.  No nerve root tension signs.  Has both medial and lateral joint line tenderness with mild patellofemoral crepitus.  Extensor mechanism is intact.  No other masses lymphadenopathy or skin changes noted in that left knee region.  Range of motion is full extension to about 1 10-1 15 of flexion.  Specialty Comments:  No specialty comments available.  Imaging: No results found.   PMFS History: There are no active problems to display for this patient.  Past Medical History:  Diagnosis Date  . COPD (chronic  obstructive pulmonary disease) ([place])   . Hypertension   . Prostate cancer ([place])   . Thyroid disease     History reviewed. No pertinent family history.  Past Surgical History:  Procedure Laterality Date  . BACK SURGERY    . HERNIA REPAIR    . INCISE AND DRAIN ABCESS    . NECK SURGERY     Social History   Occupational History  . Not on file  Tobacco Use  . Smoking status: Former Smoker    Packs/day: 2.00    Years: 20.00    Pack years: 40.00    Types: Cigarettes    Quit date: 04/20/1980    Years since quitting: 38.8  . Smokeless tobacco: Never Used  Substance and Sexual Activity  . Alcohol use: No  . Drug use: No  . Sexual activity: Yes    Birth control/protection: None

## 2019-02-07 ENCOUNTER — Encounter: Payer: Medicare Other | Admitting: *Deleted

## 2019-02-07 ENCOUNTER — Other Ambulatory Visit: Payer: Self-pay

## 2019-02-07 DIAGNOSIS — R0600 Dyspnea, unspecified: Secondary | ICD-10-CM | POA: Diagnosis not present

## 2019-02-07 NOTE — Progress Notes (Signed)
Daily Session Note  Patient Details  Name: Carlos Barrera MRN: [id number] Date of Birth: 28-Jul-1940 Referring Provider:     Pulmonary Rehab from 12/07/2018 in [facility] Cardiac and Pulmonary Rehab  Referring Provider  [name]      Encounter Date: 02/07/2019  Check In: Session Check In - 02/07/19 1027      Check-In   Supervising physician immediately available to respond to emergencies  See telemetry face sheet for immediately available ER MD    Location  [facility]-Cardiac & Pulmonary Rehab    Staff Present  Heath Lark, RN, BSN, CCRP;Amanda Sommer, BA, ACSM CEP, Exercise Physiologist;Joseph Hood RCP,RRT,BSRT    Virtual Visit  No    Medication changes reported      No    Fall or balance concerns reported     No    Warm-up and Cool-down  Performed on first and last piece of equipment    Resistance Training Performed  Yes    VAD Patient?  No    PAD/SET Patient?  No      Pain Assessment   Currently in Pain?  No/denies          Social History   Tobacco Use  Smoking Status Former Smoker  . Packs/day: 2.00  . Years: 20.00  . Pack years: 40.00  . Types: Cigarettes  . Quit date: 04/20/1980  . Years since quitting: 38.8  Smokeless Tobacco Never Used    Goals Met:  Independence with exercise equipment Exercise tolerated well No report of cardiac concerns or symptoms  Goals Unmet:  Not Applicable  Comments: Pt able to follow exercise prescription today without complaint.  Will continue to monitor for progression.    Dr. Emily Filbert is Medical Director for [place] and [facility].

## 2019-02-09 ENCOUNTER — Encounter: Payer: Medicare Other | Admitting: *Deleted

## 2019-02-09 ENCOUNTER — Other Ambulatory Visit: Payer: Self-pay

## 2019-02-09 DIAGNOSIS — R0600 Dyspnea, unspecified: Secondary | ICD-10-CM

## 2019-02-09 NOTE — Progress Notes (Signed)
Daily Session Note  Patient Details  Name: Carlos Barrera MRN: [id number] Date of Birth: March 23, 1941 Referring Provider:     Pulmonary Rehab from 12/07/2018 in [facility] Cardiac and Pulmonary Rehab  Referring Provider  [name]      Encounter Date: 02/09/2019  Check In: Session Check In - 02/09/19 1124      Check-In   Supervising physician immediately available to respond to emergencies  See telemetry face sheet for immediately available ER MD    Location  [facility]-Cardiac & Pulmonary Rehab    Staff Present  Heath Lark, RN, BSN, CCRP;Joseph Hood RCP,RRT,BSRT;Jeanna Durrell BS, Exercise Physiologist    Virtual Visit  No    Medication changes reported      No    Fall or balance concerns reported     No    Warm-up and Cool-down  Performed on first and last piece of equipment    Resistance Training Performed  Yes    VAD Patient?  No    PAD/SET Patient?  No      Pain Assessment   Currently in Pain?  No/denies          Social History   Tobacco Use  Smoking Status Former Smoker  . Packs/day: 2.00  . Years: 20.00  . Pack years: 40.00  . Types: Cigarettes  . Quit date: 04/20/1980  . Years since quitting: 38.8  Smokeless Tobacco Never Used    Goals Met:  Independence with exercise equipment Exercise tolerated well No report of cardiac concerns or symptoms  Goals Unmet:  Not Applicable  Comments: Pt able to follow exercise prescription today without complaint.  Will continue to monitor for progression.    Dr. Emily Barrera is Medical Director for [place] and [facility].

## 2019-02-10 ENCOUNTER — Encounter (HOSPITAL_COMMUNITY): Payer: Self-pay | Admitting: Emergency Medicine

## 2019-02-10 ENCOUNTER — Emergency Department (HOSPITAL_COMMUNITY): Payer: No Typology Code available for payment source

## 2019-02-10 ENCOUNTER — Emergency Department (HOSPITAL_COMMUNITY)
Admission: EM | Admit: 2019-02-10 | Discharge: 2019-02-11 | Disposition: A | Discharge status: Home / Self Care | Payer: No Typology Code available for payment source | Attending: Emergency Medicine | Admitting: Emergency Medicine

## 2019-02-10 ENCOUNTER — Other Ambulatory Visit: Payer: Self-pay

## 2019-02-10 DIAGNOSIS — R079 Chest pain, unspecified: Secondary | ICD-10-CM | POA: Insufficient documentation

## 2019-02-10 DIAGNOSIS — Z5321 Procedure and treatment not carried out due to patient leaving prior to being seen by health care provider: Secondary | ICD-10-CM | POA: Insufficient documentation

## 2019-02-10 LAB — BASIC METABOLIC PANEL
Anion gap: 10 (ref 5–15)
BUN: 24 mg/dL — ABNORMAL HIGH (ref 8–23)
CO2: 23 mmol/L (ref 22–32)
Calcium: 10.6 mg/dL — ABNORMAL HIGH (ref 8.9–10.3)
Chloride: 106 mmol/L (ref 98–111)
Creatinine, Ser: 1.15 mg/dL (ref 0.61–1.24)
GFR calc Af Amer: >60 mL/min (ref >60)
GFR calc non Af Amer: >60 mL/min (ref >60)
Glucose, Bld: 113 mg/dL — ABNORMAL HIGH (ref 70–99)
Potassium: 4.5 mmol/L (ref 3.5–5.1)
Sodium: 139 mmol/L (ref 135–145)

## 2019-02-10 LAB — CBC WITH DIFFERENTIAL/PLATELET
Abs Immature Granulocytes: 0.02 10*3/uL (ref 0.00–0.07)
Basophils Absolute: 0 10*3/uL (ref 0.0–0.1)
Basophils Relative: 1 %
Eosinophils Absolute: 0.2 10*3/uL (ref 0.0–0.5)
Eosinophils Relative: 3 %
HCT: 35.8 % — ABNORMAL LOW (ref 39.0–52.0)
Hemoglobin: 11.9 g/dL — ABNORMAL LOW (ref 13.0–17.0)
Immature Granulocytes: 0 %
Lymphocytes Relative: 11 %
Lymphs Abs: 0.6 10*3/uL — ABNORMAL LOW (ref 0.7–4.0)
MCH: 32 pg (ref 26.0–34.0)
MCHC: 33.2 g/dL (ref 30.0–36.0)
MCV: 96.2 fL (ref 80.0–100.0)
Monocytes Absolute: 0.5 10*3/uL (ref 0.1–1.0)
Monocytes Relative: 9 %
Neutro Abs: 4.4 10*3/uL (ref 1.7–7.7)
Neutrophils Relative %: 76 %
Platelets: 236 10*3/uL (ref 150–400)
RBC: 3.72 MIL/uL — ABNORMAL LOW (ref 4.22–5.81)
RDW: 12.3 % (ref 11.5–15.5)
WBC: 5.8 10*3/uL (ref 4.0–10.5)
nRBC: 0 % (ref 0.0–0.2)

## 2019-02-10 LAB — TROPONIN I (HIGH SENSITIVITY): Troponin I (High Sensitivity): 18 ng/L — ABNORMAL HIGH (ref <18)

## 2019-02-10 MED ORDER — SODIUM CHLORIDE 0.9% FLUSH: 3.0000 mL | Freq: Once | INTRAVENOUS | Status: DC

## 2019-02-10 NOTE — ED Triage Notes (Signed)
Pt transported from home by EMS after sudden onset of R sided CP after cough, pain worse with inhalation, onset 2 hours ago.  Initial BP 220/100. Down to 176/84.  Pt was seen at pulmonary rehab this am, reports worsening exertional SHOB.

## 2019-02-11 LAB — TROPONIN I (HIGH SENSITIVITY): Troponin I (High Sensitivity): 16 ng/L (ref <18)

## 2019-02-11 NOTE — ED Notes (Addendum)
Pt asking to be taken out to ER entrance because he is going to call a ride to come pick him up. Pt told that he was being moved into a room next. Pt still refused to wait and said he was leaving anyways. Pt wheeled out to entrance, waiting for ride.

## 2019-02-11 NOTE — ED Notes (Signed)
Pt reassured about waiting to be seen by provider. Pt becoming impatient. Encouraged to stay.

## 2019-02-11 NOTE — ED Notes (Signed)
Pt frustrated with the waiting time. This RN advised pt to stay and wait to be seen by a provider. Pt's questions answered and pt reassured. Tearful in lobby.

## 2019-02-14 ENCOUNTER — Encounter: Payer: Medicare Other | Admitting: *Deleted

## 2019-02-14 ENCOUNTER — Other Ambulatory Visit: Payer: Self-pay

## 2019-02-14 DIAGNOSIS — R0600 Dyspnea, unspecified: Secondary | ICD-10-CM

## 2019-02-14 NOTE — Progress Notes (Signed)
Daily Session Note  Patient Details  Name: EVANGELOS PAULINO MRN: [id number] Date of Birth: 1940-12-04 Referring Provider:     Pulmonary Rehab from 12/07/2018 in [facility] Cardiac and Pulmonary Rehab  Referring Provider  [name]      Encounter Date: 02/14/2019  Check In: Session Check In - 02/14/19 1035      Check-In   Supervising physician immediately available to respond to emergencies  See telemetry face sheet for immediately available ER MD    Staff Present  Darel Hong, RN Vickki Hearing, BA, ACSM CEP, Exercise Physiologist;Joseph Tessie Fass RCP,RRT,BSRT    Virtual Visit  No    Medication changes reported      No    Fall or balance concerns reported     No    Tobacco Cessation  No Change    Warm-up and Cool-down  Performed on first and last piece of equipment    Resistance Training Performed  Yes    VAD Patient?  No    PAD/SET Patient?  No      Pain Assessment   Currently in Pain?  No/denies          Social History   Tobacco Use  Smoking Status Former Smoker  . Packs/day: 2.00  . Years: 20.00  . Pack years: 40.00  . Types: Cigarettes  . Quit date: 04/20/1980  . Years since quitting: 38.8  Smokeless Tobacco Never Used    Goals Met:  Proper associated with RPD/PD & O2 Sat Independence with exercise equipment Using PLB without cueing & demonstrates good technique Exercise tolerated well Strength training completed today  Goals Unmet:  Not Applicable  Comments: Pt able to follow exercise prescription today without complaint.  Will continue to monitor for progression.    Dr. Emily Filbert is Medical Director for [place] and [facility].

## 2019-02-16 ENCOUNTER — Other Ambulatory Visit: Payer: Self-pay

## 2019-02-16 ENCOUNTER — Encounter: Payer: Medicare Other | Admitting: *Deleted

## 2019-02-16 DIAGNOSIS — R0600 Dyspnea, unspecified: Secondary | ICD-10-CM

## 2019-02-16 NOTE — Progress Notes (Signed)
Daily Session Note  Patient Details  Name: TRASE BUNDA MRN: [id number] Date of Birth: 26-Apr-1940 Referring Provider:     Pulmonary Rehab from 12/07/2018 in [facility] Cardiac and Pulmonary Rehab  Referring Provider  [name]      Encounter Date: 02/16/2019  Check In: Session Check In - 02/16/19 1032      Check-In   Supervising physician immediately available to respond to emergencies  See telemetry face sheet for immediately available ER MD    Location  [facility]-Cardiac & Pulmonary Rehab    Staff Present  Renita Papa, RN Vickki Hearing, BA, ACSM CEP, Exercise Physiologist;Jeanna Durrell BS, Exercise Physiologist    Virtual Visit  No    Medication changes reported      No    Fall or balance concerns reported     No    Warm-up and Cool-down  Performed on first and last piece of equipment    Resistance Training Performed  Yes    VAD Patient?  No    PAD/SET Patient?  No      Pain Assessment   Currently in Pain?  No/denies          Social History   Tobacco Use  Smoking Status Former Smoker  . Packs/day: 2.00  . Years: 20.00  . Pack years: 40.00  . Types: Cigarettes  . Quit date: 04/20/1980  . Years since quitting: 38.8  Smokeless Tobacco Never Used    Goals Met:  Independence with exercise equipment Exercise tolerated well No report of cardiac concerns or symptoms Strength training completed today  Goals Unmet:  Not Applicable  Comments: Pt able to follow exercise prescription today without complaint.  Will continue to monitor for progression.    Dr. Emily Filbert is Medical Director for [place] and [facility].

## 2019-02-20 MED ORDER — LEUPROLIDE ACETATE (6 MONTH) 45 MG IM KIT
45.0000 mg | PACK | Freq: Once | INTRAMUSCULAR | Status: AC
  Administered 2018-11-24: 45 mg via INTRAMUSCULAR

## 2019-02-20 NOTE — Addendum Note (Signed)
Addended by: Tommy Rainwater on: 02/20/2019 10:29 AM   Modules accepted: Orders

## 2019-02-21 ENCOUNTER — Other Ambulatory Visit: Payer: Self-pay

## 2019-02-21 ENCOUNTER — Encounter: Payer: Medicare Other | Attending: Internal Medicine | Admitting: *Deleted

## 2019-02-21 DIAGNOSIS — Z87891 Personal history of nicotine dependence: Secondary | ICD-10-CM | POA: Diagnosis not present

## 2019-02-21 DIAGNOSIS — R0600 Dyspnea, unspecified: Secondary | ICD-10-CM | POA: Insufficient documentation

## 2019-02-21 NOTE — Progress Notes (Signed)
Daily Session Note  Patient Details  Name: Carlos Barrera MRN: [id number] Date of Birth: [birth year] Referring Provider:     Pulmonary Rehab from 12/07/2018 in [facility] Cardiac and Pulmonary Rehab  Referring Provider  [name]      Encounter Date: 02/21/2019  Check In: Session Check In - 02/21/19 1023      Check-In   Supervising physician immediately available to respond to emergencies  See telemetry face sheet for immediately available ER MD    Location  [facility]-Cardiac & Pulmonary Rehab    Staff Present  Heath Lark, RN, BSN, CCRP;Amanda Sommer, BA, ACSM CEP, Exercise Physiologist    Virtual Visit  No    Medication changes reported      No    Fall or balance concerns reported     No    Warm-up and Cool-down  Performed on first and last piece of equipment    Resistance Training Performed  Yes    VAD Patient?  No    PAD/SET Patient?  No      Pain Assessment   Currently in Pain?  No/denies          Social History   Tobacco Use  Smoking Status Former Smoker  . Packs/day: 2.00  . Years: 20.00  . Pack years: 40.00  . Types: Cigarettes  . Quit date: 04/20/1980  . Years since quitting: 38.8  Smokeless Tobacco Never Used    Goals Met:  Independence with exercise equipment Exercise tolerated well No report of cardiac concerns or symptoms  Goals Unmet:  Not Applicable  Comments: Pt able to follow exercise prescription today without complaint.  Will continue to monitor for progression.    Dr. Emily Filbert is Medical Director for [place] and [facility].

## 2019-02-22 ENCOUNTER — Encounter: Payer: Self-pay | Admitting: *Deleted

## 2019-02-22 DIAGNOSIS — R0600 Dyspnea, unspecified: Secondary | ICD-10-CM

## 2019-02-22 NOTE — Progress Notes (Signed)
Pulmonary Individual Treatment Plan  Patient Details  Name: Carlos Barrera MRN: [id number] Date of Birth: 12-26-1940 Referring Provider:     Pulmonary Rehab from 12/07/2018 in [facility] Cardiac and Pulmonary Rehab  Referring Provider  [name]      Initial Encounter Date:    Pulmonary Rehab from 12/07/2018 in [facility] Cardiac and Pulmonary Rehab  Date  12/07/18      Visit Diagnosis: Dyspnea, unspecified type  Patient's Home Medications on Admission:  Current Outpatient Medications:  .  albuterol (VENTOLIN HFA) 108 (90 Base) MCG/ACT inhaler, Inhale 2 puffs into the lungs every 6 (six) hours as needed for wheezing or shortness of breath., Disp: , Rfl:  .  aspirin EC 81 MG tablet, Take 81 mg by mouth daily., Disp: , Rfl:  .  Calcium Carbonate-Vitamin D 600-400 MG-UNIT tablet, , Disp: , Rfl:  .  cephALEXin (KEFLEX) 500 MG capsule, Take 1 capsule (500 mg total) by mouth 2 (two) times daily. (Patient not taking: Reported on 12/06/2018), Disp: 10 capsule, Rfl: 0 .  cetirizine (ZYRTEC) 10 MG tablet, , Disp: , Rfl:  .  cholecalciferol (VITAMIN D) 25 MCG (1000 UT) tablet, , Disp: , Rfl:  .  ibuprofen (ADVIL,MOTRIN) 800 MG tablet, Take 1 tablet (800 mg total) by mouth every 8 (eight) hours as needed for moderate pain. (Patient not taking: Reported on 12/06/2018), Disp: 15 tablet, Rfl: 0 .  levothyroxine (SYNTHROID, LEVOTHROID) 137 MCG tablet, Take 137 mcg by mouth daily before breakfast., Disp: , Rfl:  .  lisinopril (PRINIVIL,ZESTRIL) 10 MG tablet, Take 10 mg by mouth daily., Disp: , Rfl:  .  MAPAP 325 MG tablet, , Disp: , Rfl:  .  Meloxicam 15 MG TBDP, Take by mouth daily as needed., Disp: , Rfl:  .  omeprazole (PRILOSEC) 20 MG capsule, , Disp: , Rfl:  .  pravastatin (PRAVACHOL) 20 MG tablet, Take 20 mg by mouth at bedtime., Disp: , Rfl:  .  prazosin (MINIPRESS) 5 MG capsule, , Disp: , Rfl:  .  ranitidine (ZANTAC) 150 MG tablet, Take 150 mg by mouth at bedtime., Disp: , Rfl:  .  REFRESH TEARS 0.5  % SOLN, , Disp: , Rfl:  .  sertraline (ZOLOFT) 100 MG tablet, Take 100 mg by mouth at bedtime., Disp: , Rfl:  .  sertraline (ZOLOFT) 50 MG tablet, , Disp: , Rfl:  .  SYMBICORT 160-4.5 MCG/ACT inhaler, , Disp: , Rfl:  .  tamsulosin (FLOMAX) 0.4 MG CAPS capsule, , Disp: , Rfl:  .  tiotropium (SPIRIVA) 18 MCG inhalation capsule, Place 18 mcg into inhaler and inhale every evening., Disp: , Rfl:   Current Facility-Administered Medications:  .  Leuprolide Acetate (6 Month) (LUPRON) injection 45 mg, 45 mg, Intramuscular, Once, Stoioff, Ronda Fairly, MD  Past Medical History: Past Medical History:  Diagnosis Date  . COPD (chronic obstructive pulmonary disease) ([place])   . Hypertension   . Prostate cancer ([place])   . Thyroid disease     Tobacco Use: Social History   Tobacco Use  Smoking Status Former Smoker  . Packs/day: 2.00  . Years: 20.00  . Pack years: 40.00  . Types: Cigarettes  . Quit date: 04/20/1980  . Years since quitting: 38.8  Smokeless Tobacco Never Used    Labs: Recent Review Flowsheet Data    There is no flowsheet data to display.       Pulmonary Assessment Scores: Pulmonary Assessment Scores    Row Name 12/07/18 1316  ADL UCSD   SOB Score total  62     Rest  1     Walk  2     Stairs  5     Bath  2     Dress  2     Shop  2       CAT Score   CAT Score  29       mMRC Score   mMRC Score  3        UCSD: Self-administered rating of dyspnea associated with activities of daily living (ADLs) 6-point scale (0 = "not at all" to 5 = "maximal or unable to do because of breathlessness")  Scoring Scores range from 0 to 120.  Minimally important difference is 5 units  CAT: CAT can identify the health impairment of COPD patients and is better correlated with disease progression.  CAT has a scoring range of zero to 40. The CAT score is classified into four groups of low (less than 10), medium (10 - 20), high (21-30) and very high (31-40) based on the impact level  of disease on health status. A CAT score over 10 suggests significant symptoms.  A worsening CAT score could be explained by an exacerbation, poor medication adherence, poor inhaler technique, or progression of COPD or comorbid conditions.  CAT MCID is 2 points  mMRC: mMRC (Modified Medical Research Council) Dyspnea Scale is used to assess the degree of baseline functional disability in patients of respiratory disease due to dyspnea. No minimal important difference is established. A decrease in score of 1 point or greater is considered a positive change.   Pulmonary Function Assessment:   Exercise Target Goals: Exercise Program Goal: Individual exercise prescription set using results from initial 6 min walk test and THRR while considering  patient's activity barriers and safety.   Exercise Prescription Goal: Initial exercise prescription builds to 30-45 minutes a day of aerobic activity, 2-3 days per week.  Home exercise guidelines will be given to patient during program as part of exercise prescription that the participant will acknowledge.  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 HR  101 bpm     Max Ex. BP  124/58     2 Minute Post BP  112/58       Interval HR   1 Minute HR  92     2 Minute HR   70 patient  holding hands behind back     3 Minute HR  - sig loss     4 Minute HR  60 see previous note - not sure HR accurate     2 Minute Post HR  78     Interval Heart Rate?  Yes       Interval Oxygen   Interval Oxygen?  Yes     Baseline Oxygen Saturation %  96 %     1 Minute Oxygen Saturation %  96 %     1 Minute Liters of Oxygen  0 L     2 Minute Oxygen Saturation %  95 %     2 Minute Liters of Oxygen  0 L     3 Minute Oxygen Saturation %  96 %     3 Minute Liters of Oxygen  0 L     4 Minute Oxygen Saturation %  95 %     4 Minute Liters of Oxygen  0 L     2 Minute Post Oxygen Saturation %  96 %     2 Minute Post Liters of Oxygen  0 L       Oxygen Initial Assessment: Oxygen Initial Assessment - 01/10/19 1320      Home Oxygen   Home Oxygen Device  --    Sleep Oxygen Prescription  --    Home Exercise Oxygen Prescription  --    Home at Rest Exercise Oxygen Prescription  --      Program Oxygen Prescription   Program Oxygen Prescription  --      Intervention   Short Term Goals  --    Long  Term Goals  --       Oxygen Re-Evaluation: Oxygen Re-Evaluation    Row Name 01/10/19 1345 02/02/19 1042           Program Oxygen Prescription   Program Oxygen Prescription  None  None        Home Oxygen   Home Oxygen Device  None  None      Sleep Oxygen Prescription  None  None      Home Exercise Oxygen Prescription  None  None      Home at Rest Exercise Oxygen Prescription  None  None        Goals/Expected Outcomes   Short Term Goals  To learn and demonstrate proper use of respiratory medications;To learn and demonstrate proper pursed lip breathing techniques or other breathing techniques.;To learn and understand importance of maintaining oxygen saturations>88%;To learn and understand importance of monitoring SPO2 with pulse oximeter and demonstrate accurate use of the pulse oximeter.  To learn and demonstrate proper pursed lip breathing techniques or other breathing  techniques.;To learn and understand importance of maintaining oxygen saturations>88%;To learn and understand importance of monitoring SPO2 with pulse oximeter and demonstrate accurate use of the pulse oximeter.      Long  Term Goals  Verbalizes importance of monitoring SPO2 with pulse oximeter and return demonstration;Maintenance of O2 saturations>88%;Exhibits proper breathing techniques, such as pursed lip breathing or other method taught during program session;Compliance with respiratory medication;Demonstrates proper use of MDI's  Maintenance of O2 saturations>88%;Exhibits proper breathing techniques, such as pursed lip breathing or other method taught during program session;Verbalizes importance of monitoring SPO2 with pulse oximeter and return demonstration      Comments  Patient states that his respiratory medications other than Albuterol are not helping him. Informed him to speak with his pulmonologist about possible going  over his medications and seeing if he can get something to help with his breathing. He feels like nothing is working for his mantainence drugs.  He does not have a pulse oximeter to check her oxygen saturation at home. Informed him where to get one and explained why it is important to have one. Practiced PLB with patient. Patient understands why PLB is important and to use it when he is short of breath. Reviewed that oxygen saturations should be 88 percent and above. He is meeting with his doctor soon to talk about his medications and his breathing.      Goals/Expected Outcomes  Short: Speak with pulmonologist about medications. Long: take medications prescribed properly.  Short: use PLB with exertion. Long: use PLB on exertion proficiently and independently.         Oxygen Discharge (Final Oxygen Re-Evaluation): Oxygen Re-Evaluation - 02/02/19 1042      Program Oxygen Prescription   Program Oxygen Prescription  None      Home Oxygen   Home Oxygen Device  None    Sleep  Oxygen Prescription  None    Home Exercise Oxygen Prescription  None    Home at Rest Exercise Oxygen Prescription  None      Goals/Expected Outcomes   Short Term Goals  To learn and demonstrate proper pursed lip breathing techniques or other breathing techniques.;To learn and understand importance of maintaining oxygen saturations>88%;To learn and understand importance of monitoring SPO2 with pulse oximeter and demonstrate accurate use of the pulse oximeter.    Long  Term Goals  Maintenance of O2 saturations>88%;Exhibits proper breathing techniques, such as pursed lip breathing or other method taught during program session;Verbalizes importance of monitoring SPO2 with pulse oximeter and return demonstration    Comments  He does not have a pulse oximeter to check her oxygen saturation at home. Informed him where to get one and explained why it is important to have one. Practiced PLB with patient. Patient understands why PLB is important and to use it when he is short of breath. Reviewed that oxygen saturations should be 88 percent and above. He is meeting with his doctor soon to talk about his medications and his breathing.    Goals/Expected Outcomes  Short: use PLB with exertion. Long: use PLB on exertion proficiently and independently.       Initial Exercise Prescription: Initial Exercise Prescription - 12/07/18 1300      Date of Initial Exercise RX and Referring Provider   Date  12/07/18    Referring Provider  [name]      Treadmill   MPH  1    Grade  0    Minutes  15   1 min then rest   METs  1.2      NuStep   Level  1    SPM  80    Minutes  15    METs  1.2      Arm Ergometer   Level  1    RPM  50    Minutes  15    METs  1.2      Biostep-RELP   Level  1    SPM  50    Minutes  15    METs  2      Prescription Details   Frequency (times per week)  2    Duration  Progress to 30 minutes of continuous aerobic without signs/symptoms of physical distress       Intensity   THRR  40-80% of Max Heartrate  104-130    Ratings of Perceived Exertion  11-13    Perceived Dyspnea  0-4      Progression   Progression  Continue to progress workloads to maintain intensity without signs/symptoms of physical distress.      Resistance Training   Training Prescription  Yes    Weight  3 lb    Reps  10-15       Perform Capillary Blood Glucose checks as needed.  Exercise Prescription Changes: Exercise Prescription Changes    Row Name 12/07/18 1300 12/20/18 1500 01/06/19 0700 01/17/19 1400 01/31/19 1100     Response to Exercise   Blood Pressure (Admit)  106/48  112/68  126/56  142/64  132/74   Blood Pressure (Exercise)  124/58  128/60  130/60  128/64  156/84   Blood Pressure (Exit)  112/58  94/68  110/54  120/64  110/58   Heart Rate (Admit)  101 bpm  95 bpm  75 bpm  64 bpm  75 bpm   Heart Rate (Exercise)  92 bpm  88 bpm  86 bpm  84 bpm  98 bpm   Heart Rate (Exit)  78 bpm  80 bpm  72 bpm  72 bpm  60 bpm   Oxygen Saturation (Admit)  96 %  97 %  94 %  94 %  92 %   Oxygen Saturation (Exercise)  95 %  93 %  94 %  94 %  94 %   Oxygen Saturation (Exit)  96 %  98 %  97 %  97 %  98 %   Rating of Perceived Exertion (Exercise)  '17  11  15  11  15   ' Perceived Dyspnea (Exercise)  '4  2  3  3  4   ' Symptoms  none  none  none  none  SOB   Duration  Progress to 30 minutes of  aerobic without signs/symptoms of physical distress  Progress to 30 minutes of  aerobic without signs/symptoms of physical distress  Continue with 30 min of aerobic exercise without signs/symptoms of physical distress.  Continue with 30 min of aerobic exercise without signs/symptoms of physical distress.  Continue with 30 min of aerobic exercise without signs/symptoms of physical distress.   Intensity  -  -  -  THRR unchanged  THRR unchanged     Progression   Progression  Continue to progress workloads to maintain intensity without signs/symptoms of physical distress.  Continue to progress workloads to  maintain intensity without signs/symptoms of physical distress.  Continue to progress workloads to maintain intensity without signs/symptoms of physical distress.  Continue to progress workloads to maintain intensity without signs/symptoms of physical distress.  Continue to progress workloads to maintain intensity without signs/symptoms of physical distress.   Average METs  -  -  -  2.5  2.85     Resistance Training   Training Prescription  -  Yes  Yes  Yes  Yes   Weight  -  3 lb  3 lb  3 lbs  3 lbs   Reps  -  10-15  10-15  10-15  10-15     Interval Training   Interval Training  -  -  -  No  No     NuStep   Level  -  '4  3  3  2   ' SPM  -  80  80  -  -   Minutes  -  '15  15  30  30   ' METs  -  2  3.1  3.1  3.7     Biostep-RELP   Level  -  -  '1  1  1   ' SPM  -  -  50  -  -   Minutes  -  -  '30  30  30   ' METs  -  -  '2  2  2     ' Home Exercise Plan   Plans to continue exercise at  -  -  -  Home (comment) walking  Home (comment) walking   Frequency  -  -  -  Add 2 additional days to program exercise sessions.  Add 2 additional days to program exercise sessions.   Initial Home Exercises Provided  -  -  -  12/27/18  12/27/18   Row Name 02/14/19 1300 02/14/19 1400           Response to Exercise   Blood Pressure (Admit)  132/58  -      Blood Pressure (Exercise)  142/60  -      Blood Pressure (Exit)  100/60  -      Heart Rate (Admit)  98 bpm  -      Heart Rate (Exercise)  98 bpm  -      Heart Rate (Exit)  101 bpm  -      Oxygen Saturation (Admit)  98 %  -      Oxygen Saturation (Exercise)  95 %  -      Oxygen Saturation (Exit)  98 %  -      Rating of Perceived Exertion (Exercise)  13  -      Perceived Dyspnea (Exercise)  4  -      Duration  Continue with 30 min of aerobic exercise without signs/symptoms of physical distress.  -      Intensity  THRR unchanged  -        Progression   Progression  Continue to progress workloads to maintain intensity without signs/symptoms of physical  distress.  -      Average METs  2.7  -        Resistance Training   Training Prescription  Yes  -      Weight  3 lb  -      Reps  10-15  -        Interval Training   Interval Training  No  -        NuStep   Level  3  -      Minutes  30  -      METs  2.7  -        Home Exercise Plan   Plans to continue exercise at  -  Home (comment) walking      Frequency  -  Add 2 additional days to program exercise sessions.      Initial Home Exercises Provided  -  12/27/18         Exercise Comments: Exercise Comments    Row Name 12/13/18 1302 12/27/18 1056         Exercise Comments  First full day of exercise!  Patient was oriented to gym and equipment including functions, settings, policies, and procedures.  Patient's individual exercise prescription and treatment plan were reviewed.  All starting workloads were established based on the results of the 6 minute walk test done at initial orientation  visit.  The plan for exercise progression was also introduced and progression will be customized based on patient's performance and goals.  Reviewed home exercise with pt today.  Pt plans to walk and look into [facility] for exercise.  Reviewed THR, pulse, RPE, sign and symptoms, NTG use, and when to call 911 or MD.  Also discussed weather considerations and indoor options.  Pt voiced understanding.  Buds shoulder has been sore since last session so he skipped the Arm crank today.         Exercise Goals and Review: Exercise Goals    Row Name 12/07/18 1309             Exercise Goals   Increase Physical Activity  Yes       Intervention  Provide advice, education, support and counseling about physical activity/exercise needs.;Develop an individualized exercise prescription for aerobic and resistive training based on initial evaluation findings, risk stratification, comorbidities and participant's personal goals.       Expected Outcomes  Short Term: Attend rehab on a regular basis to increase amount  of physical activity.;Long Term: Add in home exercise to make exercise part of routine and to increase amount of physical activity.;Long Term: Exercising regularly at least 3-5 days a week.       Increase Strength and Stamina  Yes       Intervention  Provide advice, education, support and counseling about physical activity/exercise needs.;Develop an individualized exercise prescription for aerobic and resistive training based on initial evaluation findings, risk stratification, comorbidities and participant's personal goals.       Expected Outcomes  Short Term: Increase workloads from initial exercise prescription for resistance, speed, and METs.;Short Term: Perform resistance training exercises routinely during rehab and add in resistance training at home;Long Term: Improve cardiorespiratory fitness, muscular endurance and strength as measured by increased METs and functional capacity (6MWT)       Able to understand and use rate of perceived exertion (RPE) scale  Yes       Intervention  Provide education and explanation on how to use RPE scale       Expected Outcomes  Short Term: Able to use RPE daily in rehab to express subjective intensity level;Long Term:  Able to use RPE to guide intensity level when exercising independently       Able to understand and use Dyspnea scale  Yes       Intervention  Provide education and explanation on how to use Dyspnea scale       Expected Outcomes  Short Term: Able to use Dyspnea scale daily in rehab to express subjective sense of shortness of breath during exertion;Long Term: Able to use Dyspnea scale to guide intensity level when exercising independently       Knowledge and understanding of Target Heart Rate Range (THRR)  Yes       Intervention  Provide education and explanation of THRR including how the numbers were predicted and where they are located for reference       Expected Outcomes  Short Term: Able to state/look up THRR;Short Term: Able to use daily as  guideline for intensity in rehab;Long Term: Able to use THRR to govern intensity when exercising independently       Able to check pulse independently  Yes       Intervention  Provide education and demonstration on how to check pulse in carotid and radial arteries.;Review the importance of being able to check your own pulse for safety during  independent exercise       Expected Outcomes  Short Term: Able to explain why pulse checking is important during independent exercise;Long Term: Able to check pulse independently and accurately       Understanding of Exercise Prescription  Yes       Intervention  Provide education, explanation, and written materials on patient's individual exercise prescription       Expected Outcomes  Short Term: Able to explain program exercise prescription;Long Term: Able to explain home exercise prescription to exercise independently          Exercise Goals Re-Evaluation : Exercise Goals Re-Evaluation    Row Name 12/13/18 1303 12/20/18 1545 12/27/18 1056 01/06/19 0736 01/17/19 1410     Exercise Goal Re-Evaluation   Exercise Goals Review  Increase Physical Activity;Increase Strength and Stamina;Able to understand and use rate of perceived exertion (RPE) scale;Able to understand and use Dyspnea scale;Knowledge and understanding of Target Heart Rate Range (THRR);Able to check pulse independently;Understanding of Exercise Prescription  Increase Physical Activity;Increase Strength and Stamina;Able to understand and use rate of perceived exertion (RPE) scale;Able to understand and use Dyspnea scale;Knowledge and understanding of Target Heart Rate Range (THRR);Able to check pulse independently;Understanding of Exercise Prescription  Increase Physical Activity;Increase Strength and Stamina;Able to understand and use rate of perceived exertion (RPE) scale;Knowledge and understanding of Target Heart Rate Range (THRR);Able to check pulse independently;Understanding of Exercise  Prescription  Increase Physical Activity;Able to understand and use rate of perceived exertion (RPE) scale;Increase Strength and Stamina;Able to understand and use Dyspnea scale;Knowledge and understanding of Target Heart Rate Range (THRR);Able to check pulse independently;Understanding of Exercise Prescription  Increase Physical Activity;Increase Strength and Stamina;Understanding of Exercise Prescription   Comments  Reviewed RPE scale, THR and program prescription with pt today.  Pt voiced understanding and was given a copy of goals to take home.  Buds knee gives him a lot of trouble with exercise and he only uses one leg a lot of times.  he has progressed workloads and is reporting less SOB with exercise.  he started with working for 1-2 minutes and resting and is building up from there.  Reviewed home exercise with pt today.  Pt plans to walk and look into [facility] for exercise.  Reviewed THR, pulse, RPE, sign and symptoms, NTG use, and when to call 911 or MD.  Also discussed weather considerations and indoor options.  Pt voiced understanding.  [name] attends consistently and works in correct RPE range.  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  This EP discussed a PT consult for his shoulder as he has noticed it hurts other times at home.  He can do 30 min on NS and Bio without rest.  [name] has been doing well in rehab.  He is up to level 3 on the NuStep!  We will continue to monitor his progress.   Expected Outcomes  Short: Use RPE daily to regulate intensity. Long: Follow program prescription in THR.  Short - complete 15 min without resting Long - complete 30 min without rest  -  Short - continue to attend consistently Long - increase overal MET level  Short: Increase workload on BioStep.  Long: Continue to improve stamina.   [place] Name 01/31/19 1132 02/02/19 1053 02/14/19 1401         Exercise Goal Re-Evaluation   Exercise Goals Review  Increase Physical  Activity;Increase Strength and Stamina;Understanding of Exercise Prescription  Increase Strength and Stamina;Increase  Physical Activity  Increase Physical Activity;Increase Strength and Stamina;Able to understand and use rate of perceived exertion (RPE) scale;Able to understand and use Dyspnea scale;Knowledge and understanding of Target Heart Rate Range (THRR);Able to check pulse independently;Understanding of Exercise Prescription     Comments  [name] continues to do well in rehab.  He should be ready to increase workloads again.  When last here, he was having a few bad breathing days.  We will continue to monitor his progress.  Patient states that he cannot do more exercise than what he is doing here. Informed him that we will talk about home exercise after he talkes to his doctor about his medications.  [name] has stated he does better with the arm work by just doing ROM due to shoulder issues.  He is able to do 30 min on T4 without stopping     Expected Outcomes  Short: Increase workloads.  Long: Continue to improve stamina.  Short: speak with physician about respiratory medications. Long: create a home exercise plan for patient.  Short - continue to attend consistently Long - increase overal stamina        Discharge Exercise Prescription (Final Exercise Prescription Changes): Exercise Prescription Changes - 02/14/19 1400      Home Exercise Plan   Plans to continue exercise at  Home (comment)   walking   Frequency  Add 2 additional days to program exercise sessions.    Initial Home Exercises Provided  12/27/18       Nutrition:  Target Goals: Understanding of nutrition guidelines, daily intake of sodium <1563m, cholesterol <2016m calories 30% from fat and 7% or less from saturated fats, daily to have 5 or more servings of fruits and vegetables.  Biometrics: Pre Biometrics - 12/07/18 1310      Pre Biometrics   Height  5' 7.75" (1.721 m)    Weight  178 lb 12.8 oz (81.1 kg)    BMI (Calculated)   27.38        Nutrition Therapy Plan and Nutrition Goals: Nutrition Therapy & Goals - 12/07/18 1236      Nutrition Therapy   Diet  low Na, HH diet    Protein (specify units)  65-70g    Fiber  30 grams    Whole Grain Foods  3 servings    Saturated Fats  12 max. grams    Fruits and Vegetables  5 servings/day    Sodium  1.5 grams      Personal Nutrition Goals   Nutrition Goal  ST: add protein foods to B (when he has it) and protein to snack after lunch LT: lose 5 lbs and limit SOB    Comments  Pt reports eating breakfast sometimes (OJ, muffin, sometimes yogurt). Takes out [employer] lunch with veggies and salad and meat - sometimes potatoes. Pt will have small snack of fruit with whipped cream. Discussed maybe adding peanut butter or yogurt to add protein. Disucssed HH, low Na eating and higher needs. Pt reports having a balanced relationship with food.      Intervention Plan   Intervention  Prescribe, educate and counsel regarding individualized specific dietary modifications aiming towards targeted core components such as weight, hypertension, lipid management, diabetes, heart failure and other comorbidities.;Nutrition handout(s) given to patient.    Expected Outcomes  Short Term Goal: Understand basic principles of dietary content, such as calories, fat, sodium, cholesterol and nutrients.;Short Term Goal: A plan has been developed with personal nutrition goals set during dietitian appointment.;Long  Term Goal: Adherence to prescribed nutrition plan.       Nutrition Assessments:   Nutrition Goals Re-Evaluation: Nutrition Goals Re-Evaluation    Row Name 01/10/19 1059 02/21/19 1139           Goals   Nutrition Goal  ST: add protein foods to B (when he has it) and protein to snack after lunch LT: lose 5 lbs and limit SOB  ST: add protein foods to B (when he has it) and protein to snack after lunch LT: lose 5 lbs and limit SOB      Comment  Pt reports feeling like walking is  hard with pain and strength - mentioned how protein can help him build muslce so that he can increase his strength and mobility. Pt now consistently having breakfast " grain mix".  Continue with current changes      Expected Outcome  ST: add protein foods to B (when he has it) and protein to snack after lunch LT: lose 5 lbs and limit SOB  ST: add protein foods to B (when he has it) and protein to snack after lunch LT: lose 5 lbs and limit SOB         Nutrition Goals Discharge (Final Nutrition Goals Re-Evaluation): Nutrition Goals Re-Evaluation - 02/21/19 1139      Goals   Nutrition Goal  ST: add protein foods to B (when he has it) and protein to snack after lunch LT: lose 5 lbs and limit SOB    Comment  Continue with current changes    Expected Outcome  ST: add protein foods to B (when he has it) and protein to snack after lunch LT: lose 5 lbs and limit SOB       Psychosocial: Target Goals: Acknowledge presence or absence of significant depression and/or stress, maximize coping skills, provide positive support system. Participant is able to verbalize types and ability to use techniques and skills needed for reducing stress and depression.   Initial Review & Psychosocial Screening: Initial Psych Review & Screening - 12/06/18 1117      Initial Review   Current issues with  Current Stress Concerns;Current Anxiety/Panic    Source of Stress Concerns  Poor Coping Skills;Chronic Illness    Comments  PTSD on Zoloft, not currently seeing counselor, does not like being approached from behind, unexpected noises      [place]?  Yes    Comments  Church family, good close neighbors, son lives nearby      Barriers   Psychosocial barriers to participate in program  The patient should benefit from training in stress management and relaxation.;Psychosocial barriers identified (see note)      Screening Interventions   Interventions  Encouraged to exercise;Program counselor  consult;To provide support and resources with identified psychosocial needs;Provide feedback about the scores to participant    Expected Outcomes  Short Term goal: Utilizing psychosocial counselor, staff and physician to assist with identification of specific Stressors or current issues interfering with healing process. Setting desired goal for each stressor or current issue identified.;Long Term Goal: Stressors or current issues are controlled or eliminated.;Short Term goal: Identification and review with participant of any Quality of Life or Depression concerns found by scoring the questionnaire.;Long Term goal: The participant improves quality of Life and PHQ9 Scores as seen by post scores and/or verbalization of changes       Quality of Life Scores:  Scores of 19 and below usually indicate a poorer quality  of life in these areas.  A difference of  2-3 points is a clinically meaningful difference.  A difference of 2-3 points in the total score of the Quality of Life Index has been associated with significant improvement in overall quality of life, self-image, physical symptoms, and general health in studies assessing change in quality of life.  PHQ-9: Recent Review Flowsheet Data    Depression screen [facility] 2/9 01/31/2019 01/03/2019 12/07/2018   Decreased Interest 1 0 0   Down, Depressed, Hopeless 0 0 0   PHQ - 2 Score 1 0 0   Altered sleeping '3 2 3   ' Tired, decreased energy '1 1 2   ' Change in appetite - 0 1   Feeling bad or failure about yourself  0 0 0   Trouble concentrating 0 2 0   Moving slowly or fidgety/restless 0 0 3   Suicidal thoughts 3 0 0   PHQ-9 Score '8 5 9   ' Difficult doing work/chores Somewhat difficult Not difficult at all Somewhat difficult     Interpretation of Total Score  Total Score Depression Severity:  1-4 = Minimal depression, 5-9 = Mild depression, 10-14 = Moderate depression, 15-19 = Moderately severe depression, 20-27 = Severe depression   Psychosocial Evaluation  and Intervention:   Psychosocial Re-Evaluation: Psychosocial Re-Evaluation    Row Name 01/03/19 1116 01/12/19 1040 02/02/19 1035         Psychosocial Re-Evaluation   Current issues with  -  Current Stress Concerns;Current Anxiety/Panic  History of Depression;Current Stress Concerns;Current Anxiety/Panic     Comments  PHQ 9 repeated today  Score was 5  He states that his PTSD is under controll. Exercise is helping him somewhat to keep his stress down. He has a positive attitude in class and is willing to stick it out until the end.  Reviewed patient health questionnaire (PHQ-9) with patient for follow up. Previously, patients score indicated signs/symptoms of depression. Reviewed to see if patient is improving symptom wise while in program. Score declined and patient states that it is because he has been more short of breath lately.     Expected Outcomes  -  Short:continue to attend [facility]. Long: maintain exercise to keep stress at a minimum.  Short: Continue to work toward an improvement in [place] scores by attending [facility] regularly. Long: Continue to improve stress and depression coping skills by talking with staff and attending [facility] regularly and work toward a positive mental state.     Interventions  -  Encouraged to attend Pulmonary Rehabilitation for the exercise  Encouraged to attend Pulmonary Rehabilitation for the exercise     Continue Psychosocial Services   -  Follow up required by staff  Follow up required by staff        Psychosocial Discharge (Final Psychosocial Re-Evaluation): Psychosocial Re-Evaluation - 02/02/19 1035      Psychosocial Re-Evaluation   Current issues with  History of Depression;Current Stress Concerns;Current Anxiety/Panic    Comments  Reviewed patient health questionnaire (PHQ-9) with patient for follow up. Previously, patients score indicated signs/symptoms of depression. Reviewed to see if patient is improving symptom wise while in program. Score  declined and patient states that it is because he has been more short of breath lately.    Expected Outcomes  Short: Continue to work toward an improvement in [place] scores by attending [facility] regularly. Long: Continue to improve stress and depression coping skills by talking with staff and attending [facility] regularly and work toward a positive mental  state.    Interventions  Encouraged to attend Pulmonary Rehabilitation for the exercise    Continue Psychosocial Services   Follow up required by staff       Education: Education Goals: Education classes will be provided on a weekly basis, covering required topics. Participant will state understanding/return demonstration of topics presented.  Learning Barriers/Preferences: Learning Barriers/Preferences - 12/06/18 1115      Learning Barriers/Preferences   Learning Barriers  Sight;Hearing;Exercise Concerns   wears glasses and hearing aid, wears knee brace   Learning Preferences  Individual Instruction;Written Material       Education Topics:  Initial Evaluation Education: - Verbal, written and demonstration of respiratory meds, oximetry and breathing techniques. Instruction on use of nebulizers and MDIs and importance of monitoring MDI activations.   General Nutrition Guidelines/Fats and Fiber: -Group instruction provided by verbal, written material, models and posters to present the general guidelines for heart healthy nutrition. Gives an explanation and review of dietary fats and fiber.   Controlling Sodium/Reading Food Labels: -Group verbal and written material supporting the discussion of sodium use in heart healthy nutrition. Review and explanation with models, verbal and written materials for utilization of the food label.   Exercise Physiology & General Exercise Guidelines: - Group verbal and written instruction with models to review the exercise physiology of the cardiovascular system and associated critical values. Provides  general exercise guidelines with specific guidelines to those with heart or lung disease.    Aerobic Exercise & Resistance Training: - Gives group verbal and written instruction on the various components of exercise. Focuses on aerobic and resistive training programs and the benefits of this training and how to safely progress through these programs.   Pulmonary Rehab from 02/09/2019 in [facility] Cardiac and Pulmonary Rehab  Date  02/09/19  Educator  [facility]  Instruction Review Code  1- Verbalizes Understanding      Flexibility, Balance, Mind/Body Relaxation: Provides group verbal/written instruction on the benefits of flexibility and balance training, including mind/body exercise modes such as yoga, pilates and tai chi.  Demonstration and skill practice provided.   Stress and Anxiety: - Provides group verbal and written instruction about the health risks of elevated stress and causes of high stress.  Discuss the correlation between heart/lung disease and anxiety and treatment options. Review healthy ways to manage with stress and anxiety.   Depression: - Provides group verbal and written instruction on the correlation between heart/lung disease and depressed mood, treatment options, and the stigmas associated with seeking treatment.   Exercise & Equipment Safety: - Individual verbal instruction and demonstration of equipment use and safety with use of the equipment.   Pulmonary Rehab from 12/07/2018 in [facility] Cardiac and Pulmonary Rehab  Date  12/07/18  Educator  AS  Instruction Review Code  1- Verbalizes Understanding      Infection Prevention: - Provides verbal and written material to individual with discussion of infection control including proper hand washing and proper equipment cleaning during exercise session.   Pulmonary Rehab from 12/07/2018 in [facility] Cardiac and Pulmonary Rehab  Date  12/07/18  Educator  AS  Instruction Review Code  1- Verbalizes Understanding      Falls  Prevention: - Provides verbal and written material to individual with discussion of falls prevention and safety.   Pulmonary Rehab from 12/07/2018 in [facility] Cardiac and Pulmonary Rehab  Date  12/07/18  Educator  AS  Instruction Review Code  1- Verbalizes Understanding      Diabetes: - Individual verbal and  written instruction to review signs/symptoms of diabetes, desired ranges of glucose level fasting, after meals and with exercise. Advice that pre and post exercise glucose checks will be done for 3 sessions at entry of program.   Chronic Lung Diseases: - Group verbal and written instruction to review updates, respiratory medications, advancements in procedures and treatments. Discuss use of supplemental oxygen including available portable oxygen systems, continuous and intermittent flow rates, concentrators, personal use and safety guidelines. Review proper use of inhaler and spacers. Provide informative websites for self-education.    Energy Conservation: - Provide group verbal and written instruction for methods to conserve energy, plan and organize activities. Instruct on pacing techniques, use of adaptive equipment and posture/positioning to relieve shortness of breath.   Triggers and Exacerbations: - Group verbal and written instruction to review types of environmental triggers and ways to prevent exacerbations. Discuss weather changes, air quality and the benefits of nasal washing. Review warning signs and symptoms to help prevent infections. Discuss techniques for effective airway clearance, coughing, and vibrations.   AED/CPR: - Group verbal and written instruction with the use of models to demonstrate the basic use of the AED with the basic ABC's of resuscitation.   Anatomy and Physiology of the Lungs: - Group verbal and written instruction with the use of models to provide basic lung anatomy and physiology related to function, structure and complications of lung  disease.   Anatomy & Physiology of the Heart: - Group verbal and written instruction and models provide basic cardiac anatomy and physiology, with the coronary electrical and arterial systems. Review of Valvular disease and Heart Failure   Cardiac Medications: - Group verbal and written instruction to review commonly prescribed medications for heart disease. Reviews the medication, class of the drug, and side effects.   Know Your Numbers and Risk Factors: -Group verbal and written instruction about important numbers in your health.  Discussion of what are risk factors and how they play a role in the disease process.  Review of Cholesterol, Blood Pressure, Diabetes, and BMI and the role they play in your overall health.   Pulmonary Rehab from 02/09/2019 in [facility] Cardiac and Pulmonary Rehab  Date  02/09/19  Educator  [facility]  Instruction Review Code  1- Verbalizes Understanding      Sleep Hygiene: -Provides group verbal and written instruction about how sleep can affect your health.  Define sleep hygiene, discuss sleep cycles and impact of sleep habits. Review good sleep hygiene tips.    Other: -Provides group and verbal instruction on various topics (see comments)    Knowledge Questionnaire Score: Knowledge Questionnaire Score - 12/07/18 1322      Knowledge Questionnaire Score   Pre Score  14/18        Core Components/Risk Factors/Patient Goals at Admission: Personal Goals and Risk Factors at Admission - 12/07/18 1311      Core Components/Risk Factors/Patient Goals on Admission    Weight Management  Yes;Weight Maintenance    Intervention  Weight Management: Develop a combined nutrition and exercise program designed to reach desired caloric intake, while maintaining appropriate intake of nutrient and fiber, sodium and fats, and appropriate energy expenditure required for the weight goal.;Weight Management: Provide education and appropriate resources to help participant work on and  attain dietary goals.    Admit Weight  178 lb 12.8 oz (81.1 kg)    Goal Weight: Short Term  168 lb (76.2 kg)    Goal Weight: Long Term  158 lb (71.7 kg)  Expected Outcomes  Short Term: Continue to assess and modify interventions until short term weight is achieved;Long Term: Adherence to nutrition and physical activity/exercise program aimed toward attainment of established weight goal    Intervention  Provide education on lifestyle modifcations including regular physical activity/exercise, weight management, moderate sodium restriction and increased consumption of fresh fruit, vegetables, and low fat dairy, alcohol moderation, and smoking cessation.;Monitor prescription use compliance.    Expected Outcomes  Short Term: Continued assessment and intervention until BP is < 140/30m HG in hypertensive participants. < 130/872mHG in hypertensive participants with diabetes, heart failure or chronic kidney disease.;Long Term: Maintenance of blood pressure at goal levels.    Intervention  Provide education and support for participant on nutrition & aerobic/resistive exercise along with prescribed medications to achieve LDL <70101mHDL >93m10m  Expected Outcomes  Short Term: Participant states understanding of desired cholesterol values and is compliant with medications prescribed. Participant is following exercise prescription and nutrition guidelines.;Long Term: Cholesterol controlled with medications as prescribed, with individualized exercise RX and with personalized nutrition plan. Value goals: LDL < 70mg57mL > 40 mg.       Core Components/Risk Factors/Patient Goals Review:  Goals and Risk Factor Review    Row Name 01/12/19 1043 02/02/19 1048 02/07/19 1036         Core Components/Risk Factors/Patient Goals Review   Personal Goals Review  Weight Management/Obesity;Hypertension;Lipids  Weight Management/Obesity;Lipids;Improve shortness of breath with ADL's;Hypertension  Improve shortness of breath  with ADL's;Weight Management/Obesity;Hypertension;Lipids     Review  Patient wasnt to lose a few pounds. He wants to lose around 10 pounds. His goal is to reach 170 pounds. He does not check his blood pressure at home and he has a machine. informed him to check his blood pressure at home when he wakes up. He does noit have a pulse oximeter and was informed to get one. He states he can get one through his insurance.  Patient states that he is getting more short of breath and is seeing his doctor to figure out if he can get respiratory medications. He has been having more trouble with his shortness of breath lately and has not been able to do the things that he wants to. He was going to go to church and could not shower or go due to him being short of breath.  [name] sees his pulmonologist tomorrow - he is taking meds as directed but still gets very short of breath.  he has done well with exercise and has seen imporvement there.     Expected Outcomes  Short: check blood pressure at home daily. Obtain a pulse oximeter. Long: maintain blood pressure and oxygen reading at home independently.  Short: Attend [facility] regularly to improve shortness of breath with ADL's. Long: maintain independence with ADL's  Short - see Dr tomorrow  Long - improve shortness of breath with ADLs        Core Components/Risk Factors/Patient Goals at Discharge (Final Review):  Goals and Risk Factor Review - 02/07/19 1036      Core Components/Risk Factors/Patient Goals Review   Personal Goals Review  Improve shortness of breath with ADL's;Weight Management/Obesity;Hypertension;Lipids    Review  [name] sees his pulmonologist tomorrow - he is taking meds as directed but still gets very short of breath.  he has done well with exercise and has seen imporvement there.    Expected Outcomes  Short - see Dr tomorrow  Long - improve shortness of breath with ADLs  ITP Comments: ITP Comments    Row Name 12/06/18 1134 12/07/18 1259  12/28/18 0611 01/10/19 1316 01/25/19 1335   ITP Comments  Virtual Orientation completed.  Documentation can be found in [place] paperwork linked to encounter in media section.  Original referall was faxed on 11/16/18.  Completed initial 6MWT and nutrition eval.  Initial ITP created and sent to review to Dr Sabra Heck  30 Day review. Continue with ITP unless directed changes per Medical Director review.  Patient states that he was not sure about an event that happened at home. He states that he had a muscle soreness or burn in his chest. He states that it was not painful, or heartburn but something he realized.Informed him to make note if it happens again write down what he was doing before hand.  30 day review completed. ITP sent to Dr. Emily Filbert, Medical Director of Cardiac and Pulmonary Rehab. Continue with ITP unless changes are made by physician.  Department closed starting 10/2 until further notice by infection prevention and Health at Work teams for [place].   [place] Name 02/22/19 0634           ITP Comments  30 day review completed. Continue with ITP sent to Dr. Emily Filbert, Medical Director of Cardiac and Pulmonary Rehab for review , changes as needed and signature.          Comments:

## 2019-02-23 ENCOUNTER — Encounter: Payer: Medicare Other | Admitting: *Deleted

## 2019-02-23 ENCOUNTER — Other Ambulatory Visit: Payer: Self-pay

## 2019-02-23 DIAGNOSIS — R0600 Dyspnea, unspecified: Secondary | ICD-10-CM

## 2019-02-23 NOTE — Progress Notes (Signed)
Daily Session Note  Patient Details  Name: Carlos Barrera MRN: [id number] Date of Birth: 08-07-1940 Referring Provider:     Pulmonary Rehab from 12/07/2018 in [facility] Cardiac and Pulmonary Rehab  Referring Provider  Collins [place]      Encounter Date: 02/23/2019  Check In: Session Check In - 02/23/19 1024      Check-In   Supervising physician immediately available to respond to emergencies  See telemetry face sheet for immediately available ER MD    Staff Present  Heath Lark, RN, BSN, CCRP;Amanda Sommer, BA, ACSM CEP, Exercise Physiologist;Jeanna Durrell BS, Exercise Physiologist    Virtual Visit  No    Medication changes reported      No    Fall or balance concerns reported     No    Warm-up and Cool-down  Performed on first and last piece of equipment    Resistance Training Performed  Yes    VAD Patient?  No    PAD/SET Patient?  No      Pain Assessment   Currently in Pain?  No/denies          Social History   Tobacco Use  Smoking Status Former Smoker  . Packs/day: 2.00  . Years: 20.00  . Pack years: 40.00  . Types: Cigarettes  . Quit date: 04/20/1980  . Years since quitting: 38.8  Smokeless Tobacco Never Used    Goals Met:  Independence with exercise equipment Exercise tolerated well No report of cardiac concerns or symptoms  Goals Unmet:  Not Applicable  Comments: Pt able to follow exercise prescription today without complaint.  Will continue to monitor for progression.    Dr. Emily Filbert is Medical Director for [place] and [facility].

## 2019-02-28 ENCOUNTER — Encounter: Payer: Medicare Other | Admitting: *Deleted

## 2019-02-28 ENCOUNTER — Other Ambulatory Visit: Payer: Self-pay

## 2019-02-28 DIAGNOSIS — R0600 Dyspnea, unspecified: Secondary | ICD-10-CM | POA: Diagnosis not present

## 2019-02-28 NOTE — Progress Notes (Signed)
Daily Session Note  Patient Details  Name: Carlos Barrera MRN: [id number] Date of Birth: 07-Jan-1941 Referring Provider:     Pulmonary Rehab from 12/07/2018 in [facility] Cardiac and Pulmonary Rehab  Referring Provider  [name]      Encounter Date: 02/28/2019  Check In: Session Check In - 02/28/19 1008      Check-In   Supervising physician immediately available to respond to emergencies  See telemetry face sheet for immediately available ER MD    Location  [facility]-Cardiac & Pulmonary Rehab    Staff Present  Heath Lark, RN, BSN, CCRP;Jeanna Durrell BS, Exercise Physiologist    Virtual Visit  No    Medication changes reported      No    Fall or balance concerns reported     No    Warm-up and Cool-down  Performed on first and last piece of equipment    Resistance Training Performed  Yes    VAD Patient?  No    PAD/SET Patient?  No      Pain Assessment   Currently in Pain?  No/denies          Social History   Tobacco Use  Smoking Status Former Smoker  . Packs/day: 2.00  . Years: 20.00  . Pack years: 40.00  . Types: Cigarettes  . Quit date: 04/20/1980  . Years since quitting: 38.8  Smokeless Tobacco Never Used    Goals Met:  Independence with exercise equipment Exercise tolerated well No report of cardiac concerns or symptoms  Goals Unmet:  Not Applicable  Comments: Pt able to follow exercise prescription today without complaint.  Will continue to monitor for progression.    Dr. Emily Filbert is Medical Director for [place] and [facility].

## 2019-03-02 ENCOUNTER — Other Ambulatory Visit: Payer: Self-pay

## 2019-03-02 DIAGNOSIS — R0600 Dyspnea, unspecified: Secondary | ICD-10-CM | POA: Diagnosis not present

## 2019-03-02 NOTE — Pre-Procedure Instructions (Signed)
[place], [place] - [place] [phone] [place] [address] Phone: [phone] Fax: [phone]      Your procedure is scheduled on Thursday November 19th.  Report to [facility] Main Entrance "A" at 10:15 A.M., and check in at the Admitting office.  Call this number if you have problems the morning of surgery:  [phone]  Call [phone] if you have any questions prior to your surgery date Monday-Friday 8am-4pm    Remember:  Do not eat or drink after midnight the night before your surgery  You may drink clear liquids until 9:15 the morning of your surgery.   Clear liquids allowed are: Water, Non-Citrus Juices (without pulp), Carbonated Beverages, Clear Tea, Black Coffee Only, and Gatorade. Your surgeon has also prescribed a pre-surgery Ensure Carbohydrate drink for you to have the morning of surgery. Please finish by 9:15 am. Patient Instructions  . The night before surgery:  o No food after midnight. ONLY clear liquids after midnight  . The day of surgery (if you do NOT have diabetes):  o Drink ONE (1) Pre-Surgery Clear Ensure as directed.   o This drink was given to you during your hospital  pre-op appointment visit. o The pre-op nurse will instruct you on the time to drink the  Pre-Surgery Ensure depending on your surgery time. o Finish the drink at the designated time by the pre-op nurse.  o Nothing else to drink after completing the  Pre-Surgery Clear Ensure.         If you have questions, please contact your surgeon's office.     Take these medicines the morning of surgery with A SIP OF WATER  cetirizine (ZYRTEC) levothyroxine (SYNTHROID) omeprazole (PRILOSEC) tamsulosin (FLOMAX)  7 days prior to surgery STOP taking any Aspirin (unless otherwise instructed by your surgeon), Meloxicam,  Aleve, Naproxen, Ibuprofen, Motrin, Advil, Goody's, BC's, all herbal medications, fish oil, and  all vitamins.    The Morning of Surgery  Do not wear jewelry, make-up or nail polish.  Do not wear lotions, powders, or perfumes/colognes, or deodorant  Do not shave 48 hours prior to surgery.  Men may shave face and neck.  Do not bring valuables to the hospital.  [facility] is not responsible for any belongings or valuables.  If you are a smoker, DO NOT Smoke 24 hours prior to surgery IF you wear a CPAP at night please bring your mask, tubing, and machine the morning of surgery   Remember that you must have someone to transport you home after your surgery, and remain with you for 24 hours if you are discharged the same day.   Contacts, glasses, hearing aids, dentures or bridgework may not be worn into surgery.    Leave your suitcase in the car.  After surgery it may be brought to your room.  For patients admitted to the hospital, discharge time will be determined by your treatment team.  Patients discharged the day of surgery will not be allowed to drive home.    Special instructions:   [facility]- Preparing For Surgery  Before surgery, you can play an important role. Because skin is not sterile, your skin needs to be as free of germs as possible. You can reduce the number of germs on your skin by washing with CHG (chlorahexidine gluconate) Soap before surgery.  CHG is an antiseptic cleaner which kills germs and bonds with the skin to continue killing germs even after washing.  Oral Hygiene is also important to reduce your risk of infection.  Remember - BRUSH YOUR TEETH THE MORNING OF SURGERY WITH YOUR REGULAR TOOTHPASTE  Please do not use if you have an allergy to CHG or antibacterial soaps. If your skin becomes reddened/irritated stop using the CHG.  Do not shave (including legs and underarms) for at least 48 hours prior to first CHG shower. It is OK to shave your face.  Please follow these instructions carefully.   1. Shower the NIGHT BEFORE SURGERY and the MORNING OF  SURGERY with CHG Soap.   2. If you chose to wash your hair, wash your hair first as usual with your normal shampoo.  3. After you shampoo, rinse your hair and body thoroughly to remove the shampoo.  4. Use CHG as you would any other liquid soap. You can apply CHG directly to the skin and wash gently with a scrungie or a clean washcloth.   5. Apply the CHG Soap to your body ONLY FROM THE NECK DOWN.  Do not use on open wounds or open sores. Avoid contact with your eyes, ears, mouth and genitals (private parts). Wash Face and genitals (private parts)  with your normal soap.   6. Wash thoroughly, paying special attention to the area where your surgery will be performed.  7. Thoroughly rinse your body with warm water from the neck down.  8. DO NOT shower/wash with your normal soap after using and rinsing off the CHG Soap.  9. Pat yourself dry with a CLEAN TOWEL.  10. Wear CLEAN PAJAMAS to bed the night before surgery, wear comfortable clothes the morning of surgery  11. Place CLEAN SHEETS on your bed the night of your first shower and DO NOT SLEEP WITH PETS.    Day of Surgery:  Do not apply any deodorants/lotions. Please shower the morning of surgery with the CHG soap  Please wear clean clothes to the hospital/surgery center.   Remember to brush your teeth WITH YOUR REGULAR TOOTHPASTE.   Please read over the following fact sheets that you were given.

## 2019-03-02 NOTE — Progress Notes (Signed)
Daily Session Note  Patient Details  Name: Carlos Barrera MRN: [id number] Date of Birth: [birth year] Referring Provider:     Pulmonary Rehab from 12/07/2018 in [facility] Cardiac and Pulmonary Rehab  Referring Provider  Collins [place]      Encounter Date: 03/02/2019  Check In: Session Check In - 03/02/19 1031      Check-In   Supervising physician immediately available to respond to emergencies  See telemetry face sheet for immediately available ER MD    Location  [facility]-Cardiac & Pulmonary Rehab    Staff Present  Heath Lark, RN, BSN, CCRP;Jeanna Durrell BS, Exercise Physiologist;Jessica Bethune, MA, RCEP, CCRP, CCET    Virtual Visit  No    Medication changes reported      No    Fall or balance concerns reported     No    Warm-up and Cool-down  Performed on first and last piece of equipment    Resistance Training Performed  Yes    VAD Patient?  No    PAD/SET Patient?  No      Pain Assessment   Currently in Pain?  No/denies          Social History   Tobacco Use  Smoking Status Former Smoker  . Packs/day: 2.00  . Years: 20.00  . Pack years: 40.00  . Types: Cigarettes  . Quit date: 04/20/1980  . Years since quitting: 38.8  Smokeless Tobacco Never Used    Goals Met:  Independence with exercise equipment Exercise tolerated well Strength training completed today  Goals Unmet:  Not Applicable  Comments: Pt able to follow exercise prescription today without complaint.  Will continue to monitor for progression.    Dr. Emily Filbert is Medical Director for [place] and [facility].

## 2019-03-03 ENCOUNTER — Encounter (HOSPITAL_COMMUNITY): Payer: Self-pay

## 2019-03-03 ENCOUNTER — Other Ambulatory Visit: Payer: Self-pay

## 2019-03-03 ENCOUNTER — Encounter (HOSPITAL_COMMUNITY)
Admission: RE | Admit: 2019-03-03 | Discharge: 2019-03-03 | Disposition: A | Discharge status: Home / Self Care | Payer: Medicare Other | Source: Ambulatory Visit | Attending: Orthopedic Surgery | Admitting: Orthopedic Surgery

## 2019-03-03 DIAGNOSIS — J449 Chronic obstructive pulmonary disease, unspecified: Secondary | ICD-10-CM | POA: Diagnosis not present

## 2019-03-03 DIAGNOSIS — Z87891 Personal history of nicotine dependence: Secondary | ICD-10-CM | POA: Diagnosis not present

## 2019-03-03 DIAGNOSIS — E079 Disorder of thyroid, unspecified: Secondary | ICD-10-CM | POA: Diagnosis not present

## 2019-03-03 DIAGNOSIS — I10 Essential (primary) hypertension: Secondary | ICD-10-CM | POA: Insufficient documentation

## 2019-03-03 DIAGNOSIS — M1712 Unilateral primary osteoarthritis, left knee: Secondary | ICD-10-CM | POA: Diagnosis not present

## 2019-03-03 DIAGNOSIS — Z79899 Other long term (current) drug therapy: Secondary | ICD-10-CM | POA: Insufficient documentation

## 2019-03-03 DIAGNOSIS — Z01812 Encounter for preprocedural laboratory examination: Secondary | ICD-10-CM | POA: Diagnosis present

## 2019-03-03 HISTORY — DX: Other seasonal allergic rhinitis: J30.2

## 2019-03-03 HISTORY — DX: Insomnia, unspecified: G47.00

## 2019-03-03 HISTORY — DX: Hyperlipidemia, unspecified: E78.5

## 2019-03-03 HISTORY — DX: Gastro-esophageal reflux disease without esophagitis: K21.9

## 2019-03-03 HISTORY — DX: Unspecified hearing loss, unspecified ear: H91.90

## 2019-03-03 HISTORY — DX: Post-traumatic stress disorder, unspecified: F43.10

## 2019-03-03 HISTORY — DX: Hypothyroidism, unspecified: E03.9

## 2019-03-03 HISTORY — DX: Unspecified asthma, uncomplicated: J45.909

## 2019-03-03 LAB — CBC
HCT: 36.8 % — ABNORMAL LOW (ref 39.0–52.0)
Hemoglobin: 12.1 g/dL — ABNORMAL LOW (ref 13.0–17.0)
MCH: 31.5 pg (ref 26.0–34.0)
MCHC: 32.9 g/dL (ref 30.0–36.0)
MCV: 95.8 fL (ref 80.0–100.0)
Platelets: 234 10*3/uL (ref 150–400)
RBC: 3.84 MIL/uL — ABNORMAL LOW (ref 4.22–5.81)
RDW: 12.2 % (ref 11.5–15.5)
WBC: 5 10*3/uL (ref 4.0–10.5)
nRBC: 0 % (ref 0.0–0.2)

## 2019-03-03 LAB — URINALYSIS, ROUTINE W REFLEX MICROSCOPIC
Bilirubin Urine: NEGATIVE
Glucose, UA: NEGATIVE mg/dL
Hgb urine dipstick: NEGATIVE
Ketones, ur: NEGATIVE mg/dL
Leukocytes,Ua: NEGATIVE
Nitrite: NEGATIVE
Protein, ur: NEGATIVE mg/dL
Specific Gravity, Urine: 1.024 (ref 1.005–1.030)
pH: 5 (ref 5.0–8.0)

## 2019-03-03 LAB — BASIC METABOLIC PANEL
Anion gap: 11 (ref 5–15)
BUN: 19 mg/dL (ref 8–23)
CO2: 23 mmol/L (ref 22–32)
Calcium: 11 mg/dL — ABNORMAL HIGH (ref 8.9–10.3)
Chloride: 104 mmol/L (ref 98–111)
Creatinine, Ser: 1.09 mg/dL (ref 0.61–1.24)
GFR calc Af Amer: >60 mL/min (ref >60)
GFR calc non Af Amer: >60 mL/min (ref >60)
Glucose, Bld: 105 mg/dL — ABNORMAL HIGH (ref 70–99)
Potassium: 4.1 mmol/L (ref 3.5–5.1)
Sodium: 138 mmol/L (ref 135–145)

## 2019-03-03 LAB — SURGICAL PCR SCREEN
MRSA, PCR: NEGATIVE
Staphylococcus aureus: NEGATIVE

## 2019-03-03 NOTE — Progress Notes (Signed)
Patient denies shortness of breath, fever, cough and chest pain.  [facility] [place] - denies  Chest x-ray - denies EKG - 02/13/19 Stress Test - denies ECHO - denies Cardiac Cath - denies  ERAS: Clears til 9:15 am, Ensure drink given.  Anesthesia review: Yes  Coronavirus Screening Have you experienced the following symptoms:  Cough yes/no: No Fever (>100.81F)  yes/no: No Runny nose yes/no: No Sore throat yes/no: No Difficulty breathing/shortness of breath  yes/no: No  Have you traveled in the last 14 days and where? yes/no: No  Patient verbalized understanding of instructions that were given to them at the PAT appointment.

## 2019-03-04 LAB — URINE CULTURE: Culture: <10000 — AB

## 2019-03-06 ENCOUNTER — Other Ambulatory Visit (HOSPITAL_COMMUNITY)
Admission: RE | Admit: 2019-03-06 | Discharge: 2019-03-06 | Disposition: A | Discharge status: Home / Self Care | Payer: Medicare Other | Source: Ambulatory Visit | Attending: Orthopedic Surgery | Admitting: Orthopedic Surgery

## 2019-03-06 DIAGNOSIS — Z01812 Encounter for preprocedural laboratory examination: Secondary | ICD-10-CM | POA: Insufficient documentation

## 2019-03-06 DIAGNOSIS — Z20828 Contact with and (suspected) exposure to other viral communicable diseases: Secondary | ICD-10-CM | POA: Insufficient documentation

## 2019-03-07 ENCOUNTER — Other Ambulatory Visit: Payer: Self-pay

## 2019-03-07 ENCOUNTER — Encounter: Payer: Medicare Other | Admitting: *Deleted

## 2019-03-07 DIAGNOSIS — R0600 Dyspnea, unspecified: Secondary | ICD-10-CM | POA: Diagnosis not present

## 2019-03-07 LAB — NOVEL CORONAVIRUS, NAA (HOSP ORDER, SEND-OUT TO REF LAB; TAT 18-24 HRS): SARS-CoV-2, NAA: NOT DETECTED

## 2019-03-07 NOTE — Progress Notes (Signed)
Daily Session Note  Patient Details  Name: Carlos Barrera MRN: [id number] Date of Birth: [birth year] Referring Provider:     Pulmonary Rehab from 12/07/2018 in [facility] Cardiac and Pulmonary Rehab  Referring Provider  [name]      Encounter Date: 03/07/2019  Check In: Session Check In - 03/07/19 1006      Check-In   Supervising physician immediately available to respond to emergencies  See telemetry face sheet for immediately available ER MD    Location  [facility]-Cardiac & Pulmonary Rehab    Staff Present  Heath Lark, RN, BSN, CCRP;Melissa [place] RDN, Rowe Pavy, BA, ACSM CEP, Exercise Physiologist    Virtual Visit  No    Medication changes reported      No    Fall or balance concerns reported     No    Warm-up and Cool-down  Performed on first and last piece of equipment    Resistance Training Performed  Yes    VAD Patient?  No    PAD/SET Patient?  No      Pain Assessment   Currently in Pain?  No/denies          Social History   Tobacco Use  Smoking Status Former Smoker  . Packs/day: 2.00  . Years: 20.00  . Pack years: 40.00  . Types: Cigarettes  . Quit date: 04/20/1980  . Years since quitting: 38.9  Smokeless Tobacco Never Used    Goals Met:  Independence with exercise equipment Exercise tolerated well No report of cardiac concerns or symptoms  Goals Unmet:  Not Applicable  Comments: Pt able to follow exercise prescription today without complaint.  Will continue to monitor for progression.    Dr. Emily Filbert is Medical Director for [place] and [facility].

## 2019-03-08 MED ORDER — CEFAZOLIN SODIUM-DEXTROSE 2-4 GM/100ML-% IV SOLN
2.0000 g | INTRAVENOUS | Status: AC
  Administered 2019-03-09: 13:00:00 2 g via INTRAVENOUS
  Filled 2019-03-08: qty 100

## 2019-03-08 MED ORDER — TRANEXAMIC ACID-NACL 1000-0.7 MG/100ML-% IV SOLN
1000.0000 mg | INTRAVENOUS | Status: AC
  Administered 2019-03-09: 1000 mg via INTRAVENOUS
  Filled 2019-03-08: qty 100

## 2019-03-08 NOTE — Anesthesia Preprocedure Evaluation (Addendum)
Anesthesia Evaluation  Patient identified by MRN, date of birth, ID band Patient awake    Reviewed: Allergy & Precautions, NPO status , Patient's Chart, lab work & pertinent test results  Airway Mallampati: II  TM Distance: >3 FB Neck ROM: Full    Dental no notable dental hx. (+) Teeth Intact   Pulmonary COPD, former smoker,    Pulmonary exam normal breath sounds clear to auscultation       Cardiovascular hypertension, Pt. on medications Normal cardiovascular exam Rhythm:Regular Rate:Normal     Neuro/Psych negative neurological ROS  negative psych ROS   GI/Hepatic GERD  ,  Endo/Other  Hypothyroidism   Renal/GU K+ 4.1 Cr 1.09     Musculoskeletal   Abdominal   Peds  Hematology Hgb 12.1 plt 234   Anesthesia Other Findings   Reproductive/Obstetrics                            Anesthesia Physical Anesthesia Plan  ASA: II  Anesthesia Plan: Spinal   Post-op Pain Management:  Regional for Post-op pain   Induction:   PONV Risk Score and Plan: 1 and Treatment may vary due to age or medical condition and Ondansetron  Airway Management Planned: Nasal Cannula and Natural Airway  Additional Equipment: None  Intra-op Plan:   Post-operative Plan:   Informed Consent:   Plan Discussed with:   Anesthesia Plan Comments: (L TKA c L adductor canal block plus spinal)        Anesthesia Quick Evaluation

## 2019-03-09 ENCOUNTER — Ambulatory Visit (HOSPITAL_COMMUNITY): Payer: Medicare Other | Admitting: Physician Assistant

## 2019-03-09 ENCOUNTER — Other Ambulatory Visit: Payer: Self-pay

## 2019-03-09 ENCOUNTER — Inpatient Hospital Stay (HOSPITAL_COMMUNITY)
Admission: RE | Admit: 2019-03-09 | Discharge: 2019-03-15 | DRG: 470 | Disposition: A | Discharge status: Skilled Nursing Facility | Payer: Medicare Other | Attending: Orthopedic Surgery | Admitting: Orthopedic Surgery

## 2019-03-09 ENCOUNTER — Encounter (HOSPITAL_COMMUNITY)
Admission: RE | Disposition: A | Discharge status: Skilled Nursing Facility | Payer: Self-pay | Source: Home / Self Care | Attending: Orthopedic Surgery

## 2019-03-09 ENCOUNTER — Encounter (HOSPITAL_COMMUNITY): Payer: Self-pay | Admitting: Certified Registered"

## 2019-03-09 ENCOUNTER — Ambulatory Visit (HOSPITAL_COMMUNITY): Payer: Medicare Other | Admitting: Anesthesiology

## 2019-03-09 DIAGNOSIS — M170 Bilateral primary osteoarthritis of knee: Secondary | ICD-10-CM | POA: Diagnosis present

## 2019-03-09 DIAGNOSIS — Z87891 Personal history of nicotine dependence: Secondary | ICD-10-CM

## 2019-03-09 DIAGNOSIS — J449 Chronic obstructive pulmonary disease, unspecified: Secondary | ICD-10-CM | POA: Diagnosis present

## 2019-03-09 DIAGNOSIS — Z79899 Other long term (current) drug therapy: Secondary | ICD-10-CM

## 2019-03-09 DIAGNOSIS — Z8546 Personal history of malignant neoplasm of prostate: Secondary | ICD-10-CM

## 2019-03-09 DIAGNOSIS — Z91030 Bee allergy status: Secondary | ICD-10-CM

## 2019-03-09 DIAGNOSIS — Z79890 Hormone replacement therapy (postmenopausal): Secondary | ICD-10-CM | POA: Diagnosis not present

## 2019-03-09 DIAGNOSIS — Z23 Encounter for immunization: Secondary | ICD-10-CM | POA: Diagnosis present

## 2019-03-09 DIAGNOSIS — F4310 Post-traumatic stress disorder, unspecified: Secondary | ICD-10-CM | POA: Diagnosis present

## 2019-03-09 DIAGNOSIS — I10 Essential (primary) hypertension: Secondary | ICD-10-CM | POA: Diagnosis present

## 2019-03-09 DIAGNOSIS — M1712 Unilateral primary osteoarthritis, left knee: Secondary | ICD-10-CM | POA: Diagnosis present

## 2019-03-09 DIAGNOSIS — E039 Hypothyroidism, unspecified: Secondary | ICD-10-CM | POA: Diagnosis present

## 2019-03-09 DIAGNOSIS — Z87892 Personal history of anaphylaxis: Secondary | ICD-10-CM

## 2019-03-09 DIAGNOSIS — Z20828 Contact with and (suspected) exposure to other viral communicable diseases: Secondary | ICD-10-CM | POA: Diagnosis present

## 2019-03-09 DIAGNOSIS — E785 Hyperlipidemia, unspecified: Secondary | ICD-10-CM | POA: Diagnosis present

## 2019-03-09 DIAGNOSIS — K219 Gastro-esophageal reflux disease without esophagitis: Secondary | ICD-10-CM | POA: Diagnosis present

## 2019-03-09 DIAGNOSIS — M2242 Chondromalacia patellae, left knee: Secondary | ICD-10-CM | POA: Diagnosis present

## 2019-03-09 HISTORY — PX: TOTAL KNEE ARTHROPLASTY: SHX125

## 2019-03-09 SURGERY — ARTHROPLASTY, KNEE, TOTAL
Anesthesia: Spinal | Site: Knee | Laterality: Left

## 2019-03-09 MED ORDER — DOCUSATE SODIUM 100 MG PO CAPS
100.0000 mg | ORAL_CAPSULE | Freq: Two times a day (BID) | ORAL | Status: DC
  Administered 2019-03-09 – 2019-03-15 (×12): 100 mg via ORAL
  Filled 2019-03-09 (×12): qty 1

## 2019-03-09 MED ORDER — PROPOFOL 500 MG/50ML IV EMUL
INTRAVENOUS | Status: DC | PRN
  Administered 2019-03-09: 75 ug/kg/min via INTRAVENOUS

## 2019-03-09 MED ORDER — ROPIVACAINE HCL 7.5 MG/ML IJ SOLN
INTRAMUSCULAR | Status: DC | PRN
  Administered 2019-03-09: 20 mL via PERINEURAL

## 2019-03-09 MED ORDER — ONDANSETRON HCL 4 MG PO TABS: 4.0000 mg | ORAL_TABLET | Freq: Four times a day (QID) | ORAL | Status: DC | PRN

## 2019-03-09 MED ORDER — PRAZOSIN HCL 2 MG PO CAPS
10.0000 mg | ORAL_CAPSULE | Freq: Every day | ORAL | Status: DC
  Administered 2019-03-09 – 2019-03-14 (×6): 10 mg via ORAL
  Filled 2019-03-09 (×7): qty 5

## 2019-03-09 MED ORDER — FENTANYL CITRATE (PF) 100 MCG/2ML IJ SOLN: 25.0000 ug | INTRAMUSCULAR | Status: DC | PRN

## 2019-03-09 MED ORDER — LACTATED RINGERS IV SOLN
INTRAVENOUS | Status: DC
  Administered 2019-03-09: 11:00:00 via INTRAVENOUS

## 2019-03-09 MED ORDER — OXYCODONE HCL 5 MG PO TABS
5.0000 mg | ORAL_TABLET | ORAL | Status: DC | PRN
  Administered 2019-03-09 – 2019-03-10 (×4): 10 mg via ORAL
  Administered 2019-03-12 – 2019-03-13 (×3): 5 mg via ORAL
  Filled 2019-03-09 (×3): qty 2
  Filled 2019-03-09: qty 1
  Filled 2019-03-09: qty 2
  Filled 2019-03-09 (×2): qty 1
  Filled 2019-03-09: qty 2

## 2019-03-09 MED ORDER — BUPIVACAINE IN DEXTROSE 0.75-8.25 % IT SOLN
INTRATHECAL | Status: DC | PRN
  Administered 2019-03-09: 1.8 mL via INTRATHECAL

## 2019-03-09 MED ORDER — TRANEXAMIC ACID-NACL 1000-0.7 MG/100ML-% IV SOLN
INTRAVENOUS | Status: AC
  Filled 2019-03-09: qty 100

## 2019-03-09 MED ORDER — TAMSULOSIN HCL 0.4 MG PO CAPS
0.4000 mg | ORAL_CAPSULE | Freq: Every day | ORAL | Status: DC
  Administered 2019-03-10 – 2019-03-15 (×6): 0.4 mg via ORAL
  Filled 2019-03-09 (×6): qty 1

## 2019-03-09 MED ORDER — EPHEDRINE SULFATE-NACL 50-0.9 MG/10ML-% IV SOSY
PREFILLED_SYRINGE | INTRAVENOUS | Status: DC | PRN
  Administered 2019-03-09: 10 mg via INTRAVENOUS
  Administered 2019-03-09: 15 mg via INTRAVENOUS
  Administered 2019-03-09: 10 mg via INTRAVENOUS

## 2019-03-09 MED ORDER — ONDANSETRON HCL 4 MG/2ML IJ SOLN: 4.0000 mg | Freq: Four times a day (QID) | INTRAMUSCULAR | Status: DC | PRN

## 2019-03-09 MED ORDER — METOCLOPRAMIDE HCL 5 MG/ML IJ SOLN: 5.0000 mg | Freq: Three times a day (TID) | INTRAMUSCULAR | Status: DC | PRN

## 2019-03-09 MED ORDER — METHOCARBAMOL 1000 MG/10ML IJ SOLN
500.0000 mg | Freq: Four times a day (QID) | INTRAVENOUS | Status: DC | PRN
  Filled 2019-03-09: qty 5

## 2019-03-09 MED ORDER — METHOCARBAMOL 500 MG PO TABS
500.0000 mg | ORAL_TABLET | Freq: Four times a day (QID) | ORAL | Status: DC | PRN
  Administered 2019-03-09: 23:00:00 500 mg via ORAL
  Filled 2019-03-09 (×2): qty 1

## 2019-03-09 MED ORDER — PROPOFOL 1000 MG/100ML IV EMUL
INTRAVENOUS | Status: AC
  Filled 2019-03-09: qty 100

## 2019-03-09 MED ORDER — CLONIDINE HCL (ANALGESIA) 100 MCG/ML EP SOLN
EPIDURAL | Status: DC | PRN
  Administered 2019-03-09: 100 ug

## 2019-03-09 MED ORDER — ONDANSETRON HCL 4 MG/2ML IJ SOLN: 4.0000 mg | Freq: Once | INTRAMUSCULAR | Status: DC | PRN

## 2019-03-09 MED ORDER — TRANEXAMIC ACID 1000 MG/10ML IV SOLN
2000.0000 mg | INTRAVENOUS | Status: AC
  Administered 2019-03-09: 2000 mg via TOPICAL
  Filled 2019-03-09: qty 20

## 2019-03-09 MED ORDER — LEVOTHYROXINE SODIUM 125 MCG PO TABS
125.0000 ug | ORAL_TABLET | Freq: Every day | ORAL | Status: DC
  Administered 2019-03-11 – 2019-03-15 (×5): 125 ug via ORAL
  Filled 2019-03-09 (×7): qty 1

## 2019-03-09 MED ORDER — ASPIRIN 81 MG PO CHEW
81.0000 mg | CHEWABLE_TABLET | Freq: Every day | ORAL | Status: DC
  Administered 2019-03-10 – 2019-03-15 (×6): 81 mg via ORAL
  Filled 2019-03-09 (×6): qty 1

## 2019-03-09 MED ORDER — IRRISEPT - 450ML BOTTLE WITH 0.05% CHG IN STERILE WATER, USP 99.95% OPTIME
TOPICAL | Status: DC | PRN
  Administered 2019-03-09: 450 mL

## 2019-03-09 MED ORDER — MORPHINE SULFATE (PF) 4 MG/ML IV SOLN
INTRAVENOUS | Status: DC | PRN
  Administered 2019-03-09: 8 mg via INTRAVENOUS

## 2019-03-09 MED ORDER — CLONIDINE HCL (ANALGESIA) 100 MCG/ML EP SOLN
EPIDURAL | Status: AC
  Filled 2019-03-09: qty 10

## 2019-03-09 MED ORDER — MORPHINE SULFATE (PF) 4 MG/ML IV SOLN
INTRAVENOUS | Status: AC
  Filled 2019-03-09: qty 2

## 2019-03-09 MED ORDER — PHENYLEPHRINE 40 MCG/ML (10ML) SYRINGE FOR IV PUSH (FOR BLOOD PRESSURE SUPPORT)
PREFILLED_SYRINGE | INTRAVENOUS | Status: DC | PRN
  Administered 2019-03-09: 80 ug via INTRAVENOUS
  Administered 2019-03-09 (×2): 120 ug via INTRAVENOUS
  Administered 2019-03-09: 80 ug via INTRAVENOUS

## 2019-03-09 MED ORDER — SODIUM CHLORIDE 0.9% FLUSH
INTRAVENOUS | Status: DC | PRN
  Administered 2019-03-09: 20 mL via INTRAVENOUS

## 2019-03-09 MED ORDER — METOCLOPRAMIDE HCL 5 MG PO TABS: 5.0000 mg | ORAL_TABLET | Freq: Three times a day (TID) | ORAL | Status: DC | PRN

## 2019-03-09 MED ORDER — ACETAMINOPHEN 325 MG PO TABS: 325.0000 mg | ORAL_TABLET | Freq: Four times a day (QID) | ORAL | Status: DC | PRN

## 2019-03-09 MED ORDER — 0.9 % SODIUM CHLORIDE (POUR BTL) OPTIME
TOPICAL | Status: DC | PRN
  Administered 2019-03-09 (×3): 1000 mL

## 2019-03-09 MED ORDER — HYDROMORPHONE HCL 1 MG/ML IJ SOLN
0.5000 mg | INTRAMUSCULAR | Status: DC | PRN
  Administered 2019-03-10 (×2): 0.5 mg via INTRAVENOUS
  Filled 2019-03-09 (×2): qty 1

## 2019-03-09 MED ORDER — PANTOPRAZOLE SODIUM 40 MG PO TBEC
40.0000 mg | DELAYED_RELEASE_TABLET | Freq: Every day | ORAL | Status: DC
  Administered 2019-03-10 – 2019-03-15 (×6): 40 mg via ORAL
  Filled 2019-03-09 (×6): qty 1

## 2019-03-09 MED ORDER — BUPIVACAINE HCL (PF) 0.25 % IJ SOLN
INTRAMUSCULAR | Status: AC
  Filled 2019-03-09: qty 30

## 2019-03-09 MED ORDER — PHENOL 1.4 % MT LIQD: 1.0000 | OROMUCOSAL | Status: DC | PRN

## 2019-03-09 MED ORDER — BUPIVACAINE HCL 0.25 % IJ SOLN
INTRAMUSCULAR | Status: DC | PRN
  Administered 2019-03-09: 10 mL
  Administered 2019-03-09: 20 mL

## 2019-03-09 MED ORDER — SERTRALINE HCL 50 MG PO TABS
50.0000 mg | ORAL_TABLET | Freq: Every day | ORAL | Status: DC
  Administered 2019-03-09 – 2019-03-14 (×6): 50 mg via ORAL
  Filled 2019-03-09 (×6): qty 1

## 2019-03-09 MED ORDER — CHLORHEXIDINE GLUCONATE 4 % EX LIQD: 60.0000 mL | Freq: Once | CUTANEOUS | Status: DC

## 2019-03-09 MED ORDER — FENTANYL CITRATE (PF) 100 MCG/2ML IJ SOLN
50.0000 ug | Freq: Once | INTRAMUSCULAR | Status: AC
  Administered 2019-03-09: 11:00:00 50 ug via INTRAVENOUS

## 2019-03-09 MED ORDER — CEFAZOLIN SODIUM-DEXTROSE 2-4 GM/100ML-% IV SOLN
2.0000 g | Freq: Four times a day (QID) | INTRAVENOUS | Status: AC
  Administered 2019-03-09 – 2019-03-10 (×2): 2 g via INTRAVENOUS
  Filled 2019-03-09 (×2): qty 100

## 2019-03-09 MED ORDER — PROPOFOL 10 MG/ML IV BOLUS
INTRAVENOUS | Status: DC | PRN
  Administered 2019-03-09: 10 mg via INTRAVENOUS
  Administered 2019-03-09: 5 mg via INTRAVENOUS
  Administered 2019-03-09: 20 mg via INTRAVENOUS

## 2019-03-09 MED ORDER — MENTHOL 3 MG MT LOZG
1.0000 | LOZENGE | OROMUCOSAL | Status: DC | PRN
  Administered 2019-03-10: 21:00:00 3 mg via ORAL
  Filled 2019-03-09: qty 9

## 2019-03-09 MED ORDER — SODIUM CHLORIDE 0.9 % IR SOLN
Status: DC | PRN
  Administered 2019-03-09: 3000 mL

## 2019-03-09 MED ORDER — ACETAMINOPHEN 10 MG/ML IV SOLN: 1000.0000 mg | Freq: Once | INTRAVENOUS | Status: DC | PRN

## 2019-03-09 MED ORDER — MIDAZOLAM HCL 2 MG/2ML IJ SOLN
INTRAMUSCULAR | Status: AC
  Filled 2019-03-09: qty 2

## 2019-03-09 MED ORDER — FENTANYL CITRATE (PF) 100 MCG/2ML IJ SOLN
INTRAMUSCULAR | Status: AC
  Administered 2019-03-09: 11:00:00 50 ug via INTRAVENOUS
  Filled 2019-03-09: qty 2

## 2019-03-09 MED ORDER — LACTATED RINGERS IV SOLN: INTRAVENOUS | Status: DC

## 2019-03-09 MED ORDER — POVIDONE-IODINE 10 % EX SWAB: 2.0000 "application " | Freq: Once | CUTANEOUS | Status: DC

## 2019-03-09 MED ORDER — BUPIVACAINE LIPOSOME 1.3 % IJ SUSP
20.0000 mL | INTRAMUSCULAR | Status: AC
  Administered 2019-03-09: 20 mL
  Filled 2019-03-09: qty 20

## 2019-03-09 MED ORDER — TRAMADOL HCL 50 MG PO TABS
50.0000 mg | ORAL_TABLET | Freq: Four times a day (QID) | ORAL | Status: DC
  Administered 2019-03-09 – 2019-03-15 (×21): 50 mg via ORAL
  Filled 2019-03-09 (×21): qty 1

## 2019-03-09 SURGICAL SUPPLY — 84 items
BAG DECANTER FOR FLEXI CONT (MISCELLANEOUS) ×3 IMPLANT
BANDAGE ESMARK 6X9 LF (GAUZE/BANDAGES/DRESSINGS) ×1 IMPLANT
BLADE SAG 18X100X1.27 (BLADE) ×3 IMPLANT
BLADE SURG 10 STRL SS (BLADE) ×6 IMPLANT
BNDG CMPR 9X6 STRL LF SNTH (GAUZE/BANDAGES/DRESSINGS) ×1
BNDG CMPR MED 10X6 ELC LF (GAUZE/BANDAGES/DRESSINGS) ×1
BNDG CMPR MED 15X6 ELC VLCR LF (GAUZE/BANDAGES/DRESSINGS) ×1
BNDG COHESIVE 6X5 TAN STRL LF (GAUZE/BANDAGES/DRESSINGS) ×3 IMPLANT
BNDG ELASTIC 6X10 VLCR STRL LF (GAUZE/BANDAGES/DRESSINGS) ×2 IMPLANT
BNDG ELASTIC 6X15 VLCR STRL LF (GAUZE/BANDAGES/DRESSINGS) ×3 IMPLANT
BNDG ESMARK 6X9 LF (GAUZE/BANDAGES/DRESSINGS) ×3
BOWL SMART MIX CTS (DISPOSABLE) IMPLANT
BSPLAT TIB 6 KN TRITANIUM (Knees) ×1 IMPLANT
CLOSURE WOUND 1/2 X4 (GAUZE/BANDAGES/DRESSINGS) ×2
COMP FEM SZ5 CRUC LEFT RETAIN (Orthopedic Implant) ×3 IMPLANT
COMPONENT FEM SZ5 CRU LT RETN (Orthopedic Implant) IMPLANT
CONT SPEC 4OZ CLIKSEAL STRL BL (MISCELLANEOUS) ×5 IMPLANT
COVER SURGICAL LIGHT HANDLE (MISCELLANEOUS) ×3 IMPLANT
COVER WAND RF STERILE (DRAPES) ×3 IMPLANT
CUFF TOURN SGL QUICK 34 (TOURNIQUET CUFF) ×3
CUFF TOURN SGL QUICK 42 (TOURNIQUET CUFF) IMPLANT
CUFF TRNQT CYL 34X4.125X (TOURNIQUET CUFF) ×1 IMPLANT
DECANTER SPIKE VIAL GLASS SM (MISCELLANEOUS) ×3 IMPLANT
DRAPE INCISE IOBAN 66X45 STRL (DRAPES) ×2 IMPLANT
DRAPE ORTHO SPLIT 77X108 STRL (DRAPES) ×9
DRAPE SURG ORHT 6 SPLT 77X108 (DRAPES) ×3 IMPLANT
DRAPE U-SHAPE 47X51 STRL (DRAPES) ×3 IMPLANT
DRSG AQUACEL AG ADV 3.5X10 (GAUZE/BANDAGES/DRESSINGS) ×2 IMPLANT
DRSG AQUACEL AG ADV 3.5X14 (GAUZE/BANDAGES/DRESSINGS) IMPLANT
DURAPREP 26ML APPLICATOR (WOUND CARE) ×6 IMPLANT
ELECT CAUTERY BLADE 6.4 (BLADE) ×3 IMPLANT
ELECT REM PT RETURN 9FT ADLT (ELECTROSURGICAL) ×3
ELECTRODE REM PT RTRN 9FT ADLT (ELECTROSURGICAL) ×1 IMPLANT
GAUZE SPONGE 4X4 12PLY STRL (GAUZE/BANDAGES/DRESSINGS) ×3 IMPLANT
GLOVE BIOGEL PI IND STRL 7.0 (GLOVE) ×1 IMPLANT
GLOVE BIOGEL PI IND STRL 8 (GLOVE) ×1 IMPLANT
GLOVE BIOGEL PI INDICATOR 7.0 (GLOVE) ×2
GLOVE BIOGEL PI INDICATOR 8 (GLOVE) ×2
GLOVE ECLIPSE 7.0 STRL STRAW (GLOVE) ×5 IMPLANT
GLOVE ECLIPSE 8.0 STRL XLNG CF (GLOVE) ×5 IMPLANT
GOWN STRL REUS W/ TWL LRG LVL3 (GOWN DISPOSABLE) ×3 IMPLANT
GOWN STRL REUS W/TWL LRG LVL3 (GOWN DISPOSABLE) ×12
HANDPIECE INTERPULSE COAX TIP (DISPOSABLE) ×3
HOOD PEEL AWAY FLYTE STAYCOOL (MISCELLANEOUS) ×9 IMPLANT
IMMOBILIZER KNEE 20 (SOFTGOODS)
IMMOBILIZER KNEE 20 THIGH 36 (SOFTGOODS) IMPLANT
IMMOBILIZER KNEE 22 UNIV (SOFTGOODS) ×2 IMPLANT
IMMOBILIZER KNEE 24 THIGH 36 (MISCELLANEOUS) IMPLANT
IMMOBILIZER KNEE 24 UNIV (MISCELLANEOUS)
INSERT TRIATHLON CS TIB X3 9 (Joint) ×2 IMPLANT
KIT BASIN OR (CUSTOM PROCEDURE TRAY) ×3 IMPLANT
KIT TURNOVER KIT B (KITS) ×3 IMPLANT
KNEE PATELLA ASYMMETRIC 10X35 (Knees) ×2 IMPLANT
KNEE TIBIAL COMPONENT SZ6 (Knees) ×2 IMPLANT
MANIFOLD NEPTUNE II (INSTRUMENTS) ×3 IMPLANT
NDL SPNL 18GX3.5 QUINCKE PK (NEEDLE) ×1 IMPLANT
NEEDLE 22X1 1/2 (OR ONLY) (NEEDLE) ×6 IMPLANT
NEEDLE SPNL 18GX3.5 QUINCKE PK (NEEDLE) ×3 IMPLANT
NS IRRIG 1000ML POUR BTL (IV SOLUTION) ×6 IMPLANT
PACK TOTAL JOINT (CUSTOM PROCEDURE TRAY) ×3 IMPLANT
PAD ABD 8X10 STRL (GAUZE/BANDAGES/DRESSINGS) ×4 IMPLANT
PAD ARMBOARD 7.5X6 YLW CONV (MISCELLANEOUS) ×6 IMPLANT
PAD CAST 4YDX4 CTTN HI CHSV (CAST SUPPLIES) ×1 IMPLANT
PADDING CAST COTTON 4X4 STRL (CAST SUPPLIES) ×3
PADDING CAST COTTON 6X4 STRL (CAST SUPPLIES) ×5 IMPLANT
PENCIL BUTTON HOLSTER BLD 10FT (ELECTRODE) ×2 IMPLANT
PIN FLUTED HEDLESS FIX 3.5X1/8 (PIN) ×2 IMPLANT
SET HNDPC FAN SPRY TIP SCT (DISPOSABLE) ×1 IMPLANT
STRIP CLOSURE SKIN 1/2X4 (GAUZE/BANDAGES/DRESSINGS) ×4 IMPLANT
SUCTION FRAZIER HANDLE 10FR (MISCELLANEOUS) ×2
SUCTION TUBE FRAZIER 10FR DISP (MISCELLANEOUS) ×1 IMPLANT
SUT MNCRL AB 3-0 PS2 18 (SUTURE) ×5 IMPLANT
SUT VIC AB 0 CT1 27 (SUTURE) ×9
SUT VIC AB 0 CT1 27XBRD ANBCTR (SUTURE) ×3 IMPLANT
SUT VIC AB 1 CT1 27 (SUTURE) ×24
SUT VIC AB 1 CT1 27XBRD ANBCTR (SUTURE) ×5 IMPLANT
SUT VIC AB 2-0 CT1 27 (SUTURE) ×12
SUT VIC AB 2-0 CT1 TAPERPNT 27 (SUTURE) ×4 IMPLANT
SYR 30ML LL (SYRINGE) ×9 IMPLANT
SYR TB 1ML LUER SLIP (SYRINGE) ×3 IMPLANT
TOWEL GREEN STERILE (TOWEL DISPOSABLE) ×6 IMPLANT
TOWEL GREEN STERILE FF (TOWEL DISPOSABLE) ×6 IMPLANT
TRAY CATH 16FR W/PLASTIC CATH (SET/KITS/TRAYS/PACK) ×2 IMPLANT
WATER STERILE IRR 1000ML POUR (IV SOLUTION) IMPLANT

## 2019-03-09 NOTE — Anesthesia Postprocedure Evaluation (Signed)
Anesthesia Post Note  Patient: Carlos Barrera  Procedure(s) Performed: LEFT TOTAL KNEE ARTHROPLASTY (Left Knee)     Patient location during evaluation: Nursing Unit Anesthesia Type: Spinal Level of consciousness: oriented and awake and alert Pain management: pain level controlled Vital Signs Assessment: post-procedure vital signs reviewed and stable Respiratory status: spontaneous breathing and respiratory function stable Cardiovascular status: blood pressure returned to baseline and stable Postop Assessment: no headache, no backache, no apparent nausea or vomiting and patient able to bend at knees Anesthetic complications: no    Last Vitals:  Vitals:   03/09/19 1534 03/09/19 1537  BP: 114/63 116/62  Pulse: 67 67  Resp: 16 17  Temp:    SpO2: 99% 100%    Last Pain:  Vitals:   03/09/19 1523  TempSrc:   PainSc: 0-No pain                 Barnet Glasgow

## 2019-03-09 NOTE — Progress Notes (Signed)
  Subjective: Patient stable.  Pain reasonably well controlled   Objective: Vital signs in last 24 hours: Temp:  [97.5 F (36.4 C)-97.7 F (36.5 C)] 97.6 F (36.4 C) (11/19 1645) Pulse Rate:  [57-101] 68 (11/19 1708) Resp:  [10-28] 16 (11/19 1708) BP: (86-147)/(47-88) 134/64 (11/19 1708) SpO2:  [97 %-100 %] 98 % (11/19 1708) Weight:  [77.3 kg] 77.3 kg (11/19 1031)  Intake/Output from previous day: No intake/output data recorded. Intake/Output this shift: Total I/O In: 1550 [I.V.:1550] Out: 450 [Urine:400; Blood:50]  Exam:  Dorsiflexion/Plantar flexion intact  Labs: No results for input(s): HGB in the last 72 hours. No results for input(s): WBC, RBC, HCT, PLT in the last 72 hours. No results for input(s): NA, K, CL, CO2, BUN, CREATININE, GLUCOSE, CALCIUM in the last 72 hours. No results for input(s): LABPT, INR in the last 72 hours.  Assessment/Plan: Plan is CPM machine and mobilization with physical therapy.  We will see how he does in terms of skilled nursing versus home with home health this weekend.   Landry Dyke Xyler Terpening 03/09/2019, 6:01 PM

## 2019-03-09 NOTE — Op Note (Signed)
NAME: Carlos Barrera, LOGRASSO MEDICAL RECORD [id number] ACCOUNT [id number] DATE OF BIRTH:March 09, 1941 FACILITY: [facility] LOCATION: [facility] PHYSICIAN:Cleone Hulick Randel Pigg, MD  OPERATIVE REPORT  DATE OF PROCEDURE:  03/09/2019  PREOPERATIVE DIAGNOSIS:  Left knee arthritis.  POSTOPERATIVE DIAGNOSIS:  Left knee arthritis.  PROCEDURE:  Left total knee replacement using Stryker press-fit 5 femur, 6 tibia, 9 mm polyethylene deep dish PCL retaining polyethylene and 35 mm 3-peg press-fit patellofemoral component also PCL retaining.  SURGEON:  Meredith Pel, MD  ASSISTANT:  Annie Main PA.  INDICATIONS:  The patient is a 78 year old patient with left knee arthritis refractory to nonoperative management, who presents for operative management after explanation of risks and benefits.  PROCEDURE IN DETAIL:  The patient was brought to the operating room where spinal anesthetic was induced.  Preoperative antibiotics administered.  Timeout was called.  Left leg was prescrubbed with alcohol and Betadine ad allowed to air dry, prepped with  DuraPrep solution and draped in a sterile manner.  Ioban used to cover the operative field.  Timeout was called.  Leg was elevated and exsanguinated with the Esmarch wrap.  Tourniquet was inflated.  Ioban used to cover the operative field.  Anterior  approach to knee was made.  Skin and subcutaneous tissue were sharply divided.  Median parapatellar approach was made and marked with a #1 Vicryl suture.  Patella everted.  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  The fat pad was partially excised.  Minimal medial soft tissue dissection was performed.  ACL was released.  Anterior  horn of the lateral meniscus was released.  Retractors placed.  Lateral patellofemoral ligament was released.  Soft tissue from the  anterior distal femur also released.  At this time, intramedullary alignment was used to make a cut on the proximal tibia  perpendicular to the mechanical axis.  The collaterals and neurovascular structures were protected.  A 9 mm measured cut off the least affected lateral tibial plateau was made.  In a similar manner, the distal femur was cut in 5 degrees of valgus, 8 mm.   Bone quality was excellent.  Anterior, posterior and chamfer cuts were made.  The patient did achieve full extension with a 9 mm spacer block.  The tibia was keel punched.  The patella was cut down from 26 to about 12 or 13 mm.  A 3-peg 35 mm patella  was placed, which restored the patellar thickness to within 2-3 mm.  Trial reduction was performed and the patient had full extension, full flexion with no liftoff and excellent patellar tracking using no thumbs technique.  The tibia keel punched,  rotation was marked.  Trial components were removed.  Thorough irrigation performed with both the irrigating solution x3 L as well as IrriSept solution, which was used at all times during the case beginning at the time of the incision along with after  the arthrotomy.  Also, tranexamic acid sponge was placed for 3 minutes within the incision.  True components were then tapped into position with excellent press-fit obtained and same stability parameters maintained, which was full extension with  excellent stability to varus and valgus stress at 0, 30 and 90 degrees and good patellar tracking with no thumbs technique.  Tourniquet released at this time, thorough irrigation again performed with pouring irrigation.  Arthrotomy closed over a bolster  using #1  Vicryl suture, followed by interrupted inverted 0 Vicryl suture, 2-0 Vicryl suture and a 3-0 Monocryl.  Steri-Strips and Aquacel dressing applied.  A solution of Marcaine, morphine, clonidine injected into the knee for postop pain relief.  A  bulky dressing and Ace wrap applied.  Luke's  assistance was required at all times during the case for retraction, opening, closing and cutting.  His assistance was a medical necessity.  VN/NUANCE  D:03/09/2019 T:03/09/2019 JOB:[id number]

## 2019-03-09 NOTE — Anesthesia Procedure Notes (Signed)
Anesthesia Regional Block: Adductor canal block   Pre-Anesthetic Checklist: ,, timeout performed, Correct Patient, Correct Site, Correct Laterality, Correct Procedure, Correct Position, site marked, Risks and benefits discussed,  Surgical consent,  Pre-op evaluation,  At surgeon's request and post-op pain management  Laterality: Lower and Left  Prep: chloraprep       Needles:  Injection technique: Single-shot  Needle Type: Echogenic Needle     Needle Length: 9cm  Needle Gauge: 22     Additional Needles:   Procedures:,,,, ultrasound used (permanent image in chart),,,,  Narrative:  Start time: 03/09/2019 10:55 AM End time: 03/09/2019 11:02 AM Injection made incrementally with aspirations every 5 mL.  Performed by: Personally  Anesthesiologist: Barnet Glasgow, MD  Additional Notes: Block assessed prior to surgery. Pt tolerated procedure well.

## 2019-03-09 NOTE — Brief Op Note (Signed)
   03/09/2019  3:46 PM  PATIENT:  Carlos Barrera  78 y.o. male  PRE-OPERATIVE DIAGNOSIS:  left knee osteoarthritis  POST-OPERATIVE DIAGNOSIS:  left knee osteoarthritis  PROCEDURE:  Procedure(s): LEFT TOTAL KNEE ARTHROPLASTY  SURGEON:  Surgeon(s): Meredith Pel, MD  ASSISTANT: Annie Main, PA  ANESTHESIA:   spinal  EBL: 50 ml    Total I/O In: 1400 [I.V.:1400] Out: 450 [Urine:400; Blood:50]  BLOOD ADMINISTERED: none  DRAINS: none   LOCAL MEDICATIONS USED: Marcaine morphine clonidine Exparel  SPECIMEN:  No Specimen  COUNTS:  YES  TOURNIQUET:   Total Tourniquet Time Documented: Thigh (Left) - 70 minutes Total: Thigh (Left) - 70 minutes   DICTATION: .Other Dictation: Dictation Number [id number]  PLAN OF CARE: Admit to inpatient   PATIENT DISPOSITION:  PACU - hemodynamically stable

## 2019-03-09 NOTE — Anesthesia Procedure Notes (Signed)
Spinal  Patient location during procedure: OR Start time: 03/09/2019 12:44 PM End time: 03/09/2019 12:50 PM Staffing Anesthesiologist: Barnet Glasgow, MD Preanesthetic Checklist Completed: patient identified, site marked, surgical consent, pre-op evaluation, timeout performed, IV checked, risks and benefits discussed and monitors and equipment checked Spinal Block Patient position: sitting Prep: DuraPrep Patient monitoring: heart rate, cardiac monitor, continuous pulse ox and blood pressure Approach: midline Location: L2-3 Injection technique: single-shot Needle Needle type: Sprotte  Needle gauge: 24 G Needle length: 9 cm Needle insertion depth: 7 cm Assessment Sensory level: T4 Additional Notes 1 attempt . Patient tolerated procedure well

## 2019-03-09 NOTE — Progress Notes (Signed)
Orthopedic Tech Progress Note Patient Details:  Carlos Barrera 10/11/1940 [id number]  CPM Left Knee CPM Left Knee: On Left Knee Flexion (Degrees): 40 Left Knee Extension (Degrees): 10 Additional Comments: foot roll  Post Interventions Patient Tolerated: Well Instructions Provided: Care of device  [place] Pink 03/09/2019, 4:29 PM

## 2019-03-09 NOTE — H&P (Signed)
TOTAL KNEE ADMISSION H&P  Patient is being admitted for left total knee arthroplasty.  Subjective:  Chief Complaint:left knee pain.  HPI: Carlos Barrera, 78 y.o. male, has a history of pain and functional disability in the left knee due to arthritis and has failed non-surgical conservative treatments for greater than 12 weeks to includeNSAID's and/or analgesics, viscosupplementation injections, use of assistive devices and activity modification.  Onset of symptoms was gradual, starting 3 years ago with gradually worsening course since that time. The patient noted no past surgery on the left knee(s).  Patient currently rates pain in the left knee(s) at 8 out of 10 with activity. Patient has night pain, worsening of pain with activity and weight bearing, pain that interferes with activities of daily living, pain with passive range of motion, crepitus and joint swelling.  Patient has evidence of subchondral sclerosis and joint space narrowing by imaging studies. This patient has had Global knee pain which really precludes him from consideration for partial knee replacement. There is no active infection.  There are no active problems to display for this patient.  Past Medical History:  Diagnosis Date  . Asthma    exercise induced asthma   . COPD (chronic obstructive pulmonary disease) ([place])   . GERD (gastroesophageal reflux disease)   . Hearing loss    wears hearing aids  . Hyperlipidemia   . Hypertension   . Hypothyroidism   . Insomnia   . Prostate cancer ([place])   . PTSD (post-traumatic stress disorder)   . Seasonal allergies   . Thyroid disease     Past Surgical History:  Procedure Laterality Date  . BACK SURGERY     x 2  . COLONOSCOPY     hx polyps  . HERNIA REPAIR    . INCISE AND DRAIN ABCESS     patient denies this procedure 03/03/19  . NECK SURGERY     x 1  . TONSILLECTOMY    . WISDOM TOOTH EXTRACTION      Current Facility-Administered Medications  Medication Dose Route  Frequency Provider Last Rate Last Dose  . 0.9 % irrigation (POUR BTL)    PRN Meredith Pel, MD   1,000 mL at 03/09/19 1202  . ceFAZolin (ANCEF) IVPB 2g/100 mL premix  2 g Intravenous To SS-Surg Shafiq Larch, Tonna Corner, MD      . chlorhexidine (HIBICLENS) 4 % liquid 4 application  60 mL Topical Once Magnant, Charles L, PA-C      . chlorhexidine (HIBICLENS) 4 % liquid 4 application  60 mL Topical Once Magnant, Charles L, PA-C      . lactated ringers infusion   Intravenous Continuous Barnet Glasgow, MD 10 mL/hr at 03/09/19 1035    . midazolam (VERSED) 2 MG/2ML injection           . povidone-iodine 10 % swab 2 application  2 application Topical Once Magnant, Charles L, PA-C      . sodium chloride irrigation 0.9 %    PRN Meredith Pel, MD   3,000 mL at 03/09/19 1202  . tranexamic acid (CYKLOKAPRON) IVPB 1,000 mg  1,000 mg Intravenous To OR Marlou Sa Tonna Corner, MD       Allergies  Allergen Reactions  . Bee Venom Anaphylaxis  . Feldene [Piroxicam] Hives    Social History   Tobacco Use  . Smoking status: Former Smoker    Packs/day: 2.00    Years: 20.00    Pack years: 40.00    Types: Cigarettes  Quit date: 04/20/1980    Years since quitting: 38.9  . Smokeless tobacco: Never Used  Substance Use Topics  . Alcohol use: No    History reviewed. No pertinent family history.   Review of Systems  Musculoskeletal: Positive for joint pain.  All other systems reviewed and are negative.   Objective:  Physical Exam  Constitutional: He appears well-developed.  HENT:  Head: Normocephalic.  Eyes: Pupils are equal, round, and reactive to light.  Neck: Normal range of motion.  Cardiovascular: Normal rate.  Respiratory: Effort normal.  Neurological: He is alert.  Skin: Skin is warm.  Psychiatric: He has a normal mood and affect.  Examination of the left knee demonstrates slight varus alignment with palpable pedal pulses.  Range of motion is about 0-1 10.  Patient has medial and  lateral joint line tenderness with intact extensor mechanism.  No masses lymphadenopathy or skin changes noted in the left knee region  Vital signs in last 24 hours: Temp:  [97.7 F (36.5 C)] 97.7 F (36.5 C) (11/19 1004) Pulse Rate:  [69-101] 70 (11/19 1115) Resp:  [10-28] 11 (11/19 1115) BP: (86-147)/(57-88) 122/57 (11/19 1115) SpO2:  [100 %] 100 % (11/19 1115) Weight:  [77.3 kg] 77.3 kg (11/19 1031)  Labs:   Estimated body mass index is 25.92 kg/m as calculated from the following:   Height as of this encounter: 5\' 8"  (1.727 m).   Weight as of this encounter: 77.3 kg.   Imaging Review Plain radiographs demonstrate severe degenerative joint disease of the left knee(s). The overall alignment ismild varus. The bone quality appears to be good for age and reported activity level.      Assessment/Plan:  End stage arthritis, left knee   The patient history, physical examination, clinical judgment of the provider and imaging studies are consistent with end stage degenerative joint disease of the left knee(s) and total knee arthroplasty is deemed medically necessary. The treatment options including medical management, injection therapy arthroscopy and arthroplasty were discussed at length. The risks and benefits of total knee arthroplasty were presented and reviewed. The risks due to aseptic loosening, infection, stiffness, patella tracking problems, thromboembolic complications and other imponderables were discussed. The patient acknowledged the explanation, agreed to proceed with the plan and consent was signed. Patient is being admitted for inpatient treatment for surgery, pain control, PT, OT, prophylactic antibiotics, VTE prophylaxis, progressive ambulation and ADL's and discharge planning. The patient is planning to be discharged home with home health services patient does have extended church family who can help take care of him.  Plan is for home with home health therapy with the  option of skilled nursing available if needed based on his progression    Anticipated LOS equal to or greater than 2 midnights due to - Age 97 and older with one or more of the following:  - Obesity  - Expected need for hospital services (PT, OT, Nursing) required for safe  discharge  - Anticipated need for postoperative skilled nursing care or inpatient rehab  - Active co-morbidities: None OR   - Unanticipated findings during/Post Surgery: None  - Patient is a high risk of re-admission due to: None

## 2019-03-09 NOTE — Transfer of Care (Signed)
Immediate Anesthesia Transfer of Care Note  Patient: Carlos Barrera  Procedure(s) Performed: LEFT TOTAL KNEE ARTHROPLASTY (Left Knee)  Patient Location: PACU  Anesthesia Type:Spinal  Level of Consciousness: drowsy and patient cooperative  Airway & Oxygen Therapy: Patient Spontanous Breathing and Patient connected to nasal cannula oxygen  Post-op Assessment: Report given to RN and Post -op Vital signs reviewed and stable  Post vital signs: Reviewed and stable  Last Vitals:  Vitals Value Taken Time  BP 94/47 03/09/19 1523  Temp    Pulse 69 03/09/19 1524  Resp 18 03/09/19 1524  SpO2 99 % 03/09/19 1524  Vitals shown include unvalidated device data.  Last Pain:  Vitals:   03/09/19 1031  TempSrc:   PainSc: 0-No pain      Patients Stated Pain Goal: 5 (19/75/88 3254)  Complications: No apparent anesthesia complications

## 2019-03-09 NOTE — Anesthesia Procedure Notes (Signed)
Procedure Name: [place] Performed by: Moshe Salisbury, CRNA Pre-anesthesia Checklist: Patient identified, Emergency Drugs available, Suction available, Patient being monitored and Timeout performed Patient Re-evaluated:Patient Re-evaluated prior to induction Oxygen Delivery Method: Nasal cannula Placement Confirmation: positive ETCO2 Dental Injury: Teeth and Oropharynx as per pre-operative assessment

## 2019-03-10 ENCOUNTER — Encounter (HOSPITAL_COMMUNITY): Payer: Self-pay | Admitting: General Practice

## 2019-03-10 MED ORDER — INFLUENZA VAC A&B SA ADJ QUAD 0.5 ML IM PRSY
0.5000 mL | PREFILLED_SYRINGE | INTRAMUSCULAR | Status: AC
  Administered 2019-03-12: 10:00:00 0.5 mL via INTRAMUSCULAR
  Filled 2019-03-10: qty 0.5

## 2019-03-10 NOTE — Progress Notes (Signed)
Physical Therapy Treatment Patient Details Name: Carlos Barrera MRN: [id number] DOB: 11/11/1940 Today's Date: 03/10/2019    History of Present Illness Patient is a 78 year old male s/p L TKA. PMH includes: asthma, COPD, GERD, HLD, HTN    PT Comments    Patient received in bed. Agrees to PT session. Patient anxious, reluctant to move. Requires increased time for all mobility. Patient requires mod assist for bed mobility, min guard for sit to stand from elevated bed. Cues for breathing and relaxation with mobility. Able to ambulate 5 feet with RW and min guard, cues for sequencing and use of AD. Patient will benefit from continued skilled PT to improve independence with mobility, ROM and strength.        Follow Up Recommendations  SNF;Supervision/Assistance - 24 hour     Equipment Recommendations  None recommended by PT    Recommendations for Other Services       Precautions / Restrictions Precautions Precautions: Fall Required Braces or Orthoses: Knee Immobilizer - Left Restrictions Weight Bearing Restrictions: No LLE Weight Bearing: Weight bearing as tolerated    Mobility  Bed Mobility Overal bed mobility: Needs Assistance Bed Mobility: Supine to Sit     Supine to sit: Mod assist     General bed mobility comments: increased time with bed mobility, mod assist with L LE/trunk  Transfers Overall transfer level: Needs assistance Equipment used: Rolling walker (2 wheeled) Transfers: Sit to/from Stand Sit to Stand: From elevated surface;Min guard         General transfer comment: bed elevated quite high per his request.  Ambulation/Gait Ambulation/Gait assistance: Min guard Gait Distance (Feet): 5 Feet Assistive device: Rolling walker (2 wheeled) Gait Pattern/deviations: Step-to pattern;Decreased stride length Gait velocity: decr   General Gait Details: Increased time, stopping with each step. Cues to push through arms to un-weight L LE   Stairs              Wheelchair Mobility    Modified Rankin (Stroke Patients Only)       Balance Overall balance assessment: Needs assistance Sitting-balance support: Single extremity supported;Feet supported Sitting balance-Leahy Scale: Fair     Standing balance support: Bilateral upper extremity supported;During functional activity Standing balance-Leahy Scale: Fair Standing balance comment: steady with static standing using RW                            Cognition Arousal/Alertness: Awake/alert Behavior During Therapy: WFL for tasks assessed/performed;Anxious Overall Cognitive Status: Within Functional Limits for tasks assessed                                        Exercises Total Joint Exercises Ankle Circles/Pumps: AROM;Both;10 reps Quad Sets: AROM;10 reps;Both Heel Slides: AAROM;Left;10 reps Hip ABduction/ADduction: AAROM;Left;10 reps Goniometric ROM: 0-40    General Comments        Pertinent Vitals/Pain Pain Assessment: 0-10 Pain Score: 10-Worst pain ever Pain Location: L knee Pain Descriptors / Indicators: Aching;Grimacing;Guarding;Discomfort;Shooting;Cramping Pain Intervention(s): Limited activity within patient's tolerance;Monitored during session;Repositioned;Premedicated before session    [place] expects to be discharged to:: Private residence Living Arrangements: Alone   Type of Home: House Home Access: Stairs to enter   Home Layout: One level Home Equipment: Environmental consultant - 2 wheels;Walker - 4 wheels      Prior Function Level of Independence: Independent with assistive device(s)  Comments: could get around indoors without AD, used RW or rollator as needed   PT Goals (current goals can now be found in the care plan section) Acute Rehab PT Goals Patient Stated Goal: not sure, hopes to go home PT Goal Formulation: With patient Time For Goal Achievement: 03/17/19 Potential to Achieve Goals: Good Progress towards  PT goals: Progressing toward goals    Frequency    7X/week      PT Plan Current plan remains appropriate    Co-evaluation              AM-PAC PT "6 Clicks" Mobility   Outcome Measure  Help needed turning from your back to your side while in a flat bed without using bedrails?: A Lot Help needed moving from lying on your back to sitting on the side of a flat bed without using bedrails?: A Lot Help needed moving to and from a bed to a chair (including a wheelchair)?: A Lot Help needed standing up from a chair using your arms (e.g., wheelchair or bedside chair)?: A Lot Help needed to walk in hospital room?: A Little Help needed climbing 3-5 steps with a railing? : A Lot 6 Click Score: 13    End of Session Equipment Utilized During Treatment: Gait belt Activity Tolerance: Patient limited by pain Patient left: in chair;with chair alarm set;with call bell/phone within reach Nurse Communication: Mobility status PT Visit Diagnosis: Muscle weakness (generalized) (M62.81);Difficulty in walking, not elsewhere classified (R26.2);Pain Pain - Right/Left: Left Pain - part of body: Knee     Time: 0962-8366 PT Time Calculation (min) (ACUTE ONLY): 23 min  Charges:  $Gait Training: 8-22 mins $Therapeutic Exercise: 8-22 mins $Therapeutic Activity: 23-37 mins                     Danean Marner, PT, GCS 03/10/19,2:31 PM

## 2019-03-10 NOTE — Plan of Care (Signed)
  Problem: Activity: Goal: Ability to avoid complications of mobility impairment will improve Outcome: Progressing Goal: Ability to tolerate increased activity will improve Outcome: Progressing   Problem: Education: Goal: Verbalization of understanding the information provided will improve Outcome: Progressing   Problem: Physical Regulation: Goal: Postoperative complications will be avoided or minimized Outcome: Progressing   Problem: Skin Integrity: Goal: Signs of wound healing will improve Outcome: Progressing

## 2019-03-10 NOTE — NC FL2 (Signed)
[place] MEDICAID FL2 LEVEL OF CARE SCREENING TOOL     IDENTIFICATION  Patient Name: Carlos Barrera Birthdate: [birth year] Sex: male Admission Date (Current Location): 03/09/2019  [facility] and [place] Number:  Herbalist and Address:  [facility] [place] [place] [address], [place], [place] [address]      Provider Number: [id number]  Attending Physician Name and Address:  Meredith Pel, MD  Relative Name and Phone Number:  [name] [phone]    Current Level of Care: Hospital Recommended Level of Care: [place] Prior Approval Number:    Date Approved/Denied:   PASRR Number: [id number]  Discharge Plan: SNF    Current Diagnoses: Patient Active Problem List   Diagnosis Date Noted  . Arthritis of left knee 03/09/2019    Orientation RESPIRATION BLADDER Height & Weight     Self, Time, Situation, Place  Normal Continent Weight: 77.3 kg Height:  5\' 8"  (172.7 cm)  BEHAVIORAL SYMPTOMS/MOOD NEUROLOGICAL BOWEL NUTRITION STATUS  Other (Comment)(N/A) (N/A) Continent Diet  AMBULATORY STATUS COMMUNICATION OF NEEDS Skin   Limited Assist Verbally Surgical wounds(Left Knee)                       Personal Care Assistance Level of Assistance  Bathing, Feeding, Dressing, Total care Bathing Assistance: Limited assistance Feeding assistance: Independent Dressing Assistance: Limited assistance Total Care Assistance: Limited assistance   Functional Limitations Info  Sight, Hearing, Speech Sight Info: Adequate Hearing Info: Adequate Speech Info: Adequate    SPECIAL CARE FACTORS FREQUENCY  PT (By licensed PT)     PT Frequency: Min 7X/week              Contractures Contractures Info: Not present    Additional Factors Info  Allergies, Code Status Code Status Info: Full Code Allergies Info: Bee venom; Feldene           Current Medications (03/10/2019):  This is the current hospital active medication  list Current Facility-Administered Medications  Medication Dose Route Frequency Provider Last Rate Last Dose  . acetaminophen (TYLENOL) tablet 325-650 mg  325-650 mg Oral Q6H PRN Magnant, Charles L, PA-C      . aspirin chewable tablet 81 mg  81 mg Oral Daily Magnant, Charles L, PA-C   81 mg at 03/10/19 0906  . docusate sodium (COLACE) capsule 100 mg  100 mg Oral BID Magnant, Charles L, PA-C   100 mg at 03/10/19 9476  . HYDROmorphone (DILAUDID) injection 0.5 mg  0.5 mg Intravenous Q4H PRN Magnant, Charles L, PA-C   0.5 mg at 03/10/19 5465  . [START ON 03/11/2019] influenza vaccine adjuvanted (FLUAD) injection 0.5 mL  0.5 mL Intramuscular Tomorrow-1000 Meredith Pel, MD      . lactated ringers infusion   Intravenous Continuous Barnet Glasgow, MD 10 mL/hr at 03/09/19 1035    . lactated ringers infusion   Intravenous Continuous Magnant, Charles L, PA-C      . levothyroxine (SYNTHROID) tablet 125 mcg  125 mcg Oral QAC breakfast Magnant, Charles L, PA-C      . menthol-cetylpyridinium (CEPACOL) lozenge 3 mg  1 lozenge Oral PRN Magnant, Charles L, PA-C       Or  . phenol (CHLORASEPTIC) mouth spray 1 spray  1 spray Mouth/Throat PRN Magnant, Charles L, PA-C      . methocarbamol (ROBAXIN) tablet 500 mg  500 mg Oral Q6H PRN Magnant, Charles L, PA-C   500 mg at 03/09/19 2313  Or  . methocarbamol (ROBAXIN) 500 mg in dextrose 5 % 50 mL IVPB  500 mg Intravenous Q6H PRN Magnant, Charles L, PA-C      . metoCLOPramide (REGLAN) tablet 5-10 mg  5-10 mg Oral Q8H PRN Magnant, Charles L, PA-C       Or  . metoCLOPramide (REGLAN) injection 5-10 mg  5-10 mg Intravenous Q8H PRN Magnant, Charles L, PA-C      . ondansetron (ZOFRAN) tablet 4 mg  4 mg Oral Q6H PRN Magnant, Charles L, PA-C       Or  . ondansetron (ZOFRAN) injection 4 mg  4 mg Intravenous Q6H PRN Magnant, Charles L, PA-C      . oxyCODONE (Oxy IR/ROXICODONE) immediate release tablet 5-10 mg  5-10 mg Oral Q4H PRN Magnant, Charles L, PA-C   10 mg at  03/10/19 0925  . pantoprazole (PROTONIX) EC tablet 40 mg  40 mg Oral Daily Magnant, Charles L, PA-C   40 mg at 03/10/19 1308  . prazosin (MINIPRESS) capsule 10 mg  10 mg Oral QHS Magnant, Charles L, PA-C   10 mg at 03/09/19 2318  . sertraline (ZOLOFT) tablet 50 mg  50 mg Oral QHS Magnant, Charles L, PA-C   50 mg at 03/09/19 2313  . tamsulosin (FLOMAX) capsule 0.4 mg  0.4 mg Oral Daily Magnant, Charles L, PA-C   0.4 mg at 03/10/19 0906  . traMADol (ULTRAM) tablet 50 mg  50 mg Oral Q6H Magnant, Charles L, PA-C   50 mg at 03/10/19 1242     Discharge Medications: Please see discharge summary for a list of discharge medications.  Relevant Imaging Results:  Relevant Lab Results:   Additional Information SSN: [id number]  [place], BSN, NCM-BC, ACM-RN [phone]

## 2019-03-10 NOTE — Plan of Care (Signed)
Pt declined to have ortho tech come place CPM. Requested it be placed on tomorrow morning.  Problem: Activity: Goal: Ability to avoid complications of mobility impairment will improve Outcome: Progressing   Problem: Skin Integrity: Goal: Signs of wound healing will improve Outcome: Progressing

## 2019-03-10 NOTE — Evaluation (Signed)
Physical Therapy Evaluation Patient Details Name: Carlos Barrera MRN: [id number] DOB: [birth year] Today's Date: 03/10/2019   History of Present Illness  Patient is a 78 year old male s/p L TKA. PMH includes: asthma, COPD, GERD, HLD, HTN  Clinical Impression  Patient received in bed, reports he did not sleep, pain medicine was not working. Agrees to PT session. Patient limited by pain with bed mobility requiring mod assist to raise trunk and assist left LE with increased time needed. Small increments of movement with rest in between.  Patient is able to transfer sit to stand with min guard, bed elevated. Unable to attempt ambulation requesting to sit back down. Declined attempt to pivot to recliner. Patient assisted back to supine in bed with mod assist. Patient will benefit from skilled PT to improve strength, and independence with mobility.      Follow Up Recommendations SNF;Supervision/Assistance - 24 hour    Equipment Recommendations  None recommended by PT    Recommendations for Other Services       Precautions / Restrictions Precautions Precautions: Fall Required Braces or Orthoses: Knee Immobilizer - Left Restrictions Weight Bearing Restrictions: No LLE Weight Bearing: Weight bearing as tolerated      Mobility  Bed Mobility Overal bed mobility: Needs Assistance Bed Mobility: Supine to Sit     Supine to sit: Mod assist     General bed mobility comments: increased time with bed mobility, mod assist with L LE  Transfers Overall transfer level: Needs assistance Equipment used: Rolling walker (2 wheeled) Transfers: Sit to/from Stand Sit to Stand: Min guard;From elevated surface         General transfer comment: bed elevated quite high per his request.  Ambulation/Gait             General Gait Details: unable. Declines even pivoting to recliner, requests to sit back down then lie back down.  Stairs            Wheelchair Mobility    Modified Rankin  (Stroke Patients Only)       Balance Overall balance assessment: Needs assistance Sitting-balance support: Feet supported;Bilateral upper extremity supported Sitting balance-Leahy Scale: Fair     Standing balance support: Bilateral upper extremity supported Standing balance-Leahy Scale: Fair                               Pertinent Vitals/Pain Pain Assessment: 0-10 Pain Score: 7  Pain Descriptors / Indicators: Aching;Shooting;Discomfort;Operative site guarding;Sore;Tender Pain Intervention(s): Limited activity within patient's tolerance;Monitored during session;Repositioned    Home Living Family/patient expects to be discharged to:: Private residence Living Arrangements: Alone   Type of Home: House Home Access: Stairs to enter   Technical brewer of Steps: 3 Home Layout: One level Home Equipment: Environmental consultant - 2 wheels;Walker - 4 wheels      Prior Function Level of Independence: Independent with assistive device(s)         Comments: could get around indoors without AD, used RW or rollator as needed     Hand Dominance        Extremity/Trunk Assessment   Upper Extremity Assessment Upper Extremity Assessment: Overall WFL for tasks assessed    Lower Extremity Assessment Lower Extremity Assessment: LLE deficits/detail LLE: Unable to fully assess due to pain LLE Sensation: WNL LLE Coordination: decreased gross motor;decreased fine motor    Cervical / Trunk Assessment Cervical / Trunk Assessment: Normal  Communication   Communication: No difficulties  Cognition Arousal/Alertness: Awake/alert Behavior During Therapy: WFL for tasks assessed/performed Overall Cognitive Status: Within Functional Limits for tasks assessed                                        General Comments      Exercises Total Joint Exercises Ankle Circles/Pumps: AROM;10 reps;Both;Supine Quad Sets: AROM;10 reps;Supine;Both Heel Slides: AAROM;10  reps;Left;Supine Hip ABduction/ADduction: AAROM;Left;10 reps;Supine   Assessment/Plan    PT Assessment Patient needs continued PT services  PT Problem List Decreased strength;Decreased mobility;Decreased activity tolerance;Decreased balance;Pain       PT Treatment Interventions Therapeutic activities;Gait training;Therapeutic exercise;Stair training;Functional mobility training;Balance training;Patient/family education    PT Goals (Current goals can be found in the Care Plan section)  Acute Rehab PT Goals Patient Stated Goal: not sure, hopes to go home PT Goal Formulation: With patient Time For Goal Achievement: 03/17/19 Potential to Achieve Goals: Good    Frequency 7X/week   Barriers to discharge Decreased caregiver support;Inaccessible home environment      Co-evaluation               AM-PAC PT "6 Clicks" Mobility  Outcome Measure Help needed turning from your back to your side while in a flat bed without using bedrails?: A Lot Help needed moving from lying on your back to sitting on the side of a flat bed without using bedrails?: A Lot Help needed moving to and from a bed to a chair (including a wheelchair)?: Total Help needed standing up from a chair using your arms (e.g., wheelchair or bedside chair)?: Total Help needed to walk in hospital room?: Total Help needed climbing 3-5 steps with a railing? : Total 6 Click Score: 8    End of Session Equipment Utilized During Treatment: Gait belt;Left knee immobilizer Activity Tolerance: Patient limited by pain Patient left: in bed;with bed alarm set;with call bell/phone within reach Nurse Communication: Mobility status PT Visit Diagnosis: Difficulty in walking, not elsewhere classified (R26.2);Pain;Other abnormalities of gait and mobility (R26.89);Muscle weakness (generalized) (M62.81) Pain - Right/Left: Left Pain - part of body: Knee    Time: 1000-1040 PT Time Calculation (min) (ACUTE ONLY): 40 min   Charges:    PT Evaluation $PT Eval Moderate Complexity: 1 Mod PT Treatments $Therapeutic Exercise: 8-22 mins $Therapeutic Activity: 23-37 mins        Trayvion Embleton, PT, GCS 03/10/19,10:53 AM

## 2019-03-10 NOTE — Progress Notes (Signed)
  Subjective: Carlos Barrera is a 78 y.o. male s/p left TKA.  They are POD1.  Pt's pain is moderate but controlled.  Pt notes occ tingling in his foot but denies any numbness or weakness.  Pt has not ambulated yet.     Objective: Vital signs in last 24 hours: Temp:  [97.5 F (36.4 C)-99.3 F (37.4 C)] 98.2 F (36.8 C) (11/20 0738) Pulse Rate:  [57-101] 96 (11/20 0738) Resp:  [10-28] 19 (11/20 0258) BP: (86-147)/(47-88) 129/61 (11/20 0738) SpO2:  [95 %-100 %] 95 % (11/20 0738) Weight:  [77.3 kg] 77.3 kg (11/19 1031)  Intake/Output from previous day: 11/19 0701 - 11/20 0700 In: 1550 [I.V.:1550] Out: 950 [Urine:900; Blood:50] Intake/Output this shift: No intake/output data recorded.  Exam:  No gross blood or drainage overlying the dressing 2+ DP pulse Sensation intact distally in the left foot.  Mild numbness of the medial calf.  Full sensation throughout all other dermatomes Able to dorsiflex and plantarflex the left foot   Labs: No results for input(s): HGB in the last 72 hours. No results for input(s): WBC, RBC, HCT, PLT in the last 72 hours. No results for input(s): NA, K, CL, CO2, BUN, CREATININE, GLUCOSE, CALCIUM in the last 72 hours. No results for input(s): LABPT, INR in the last 72 hours.  Assessment/Plan: Pt is POD1 s/p left TKA.    -Plan to discharge to home in coming days pending patient's pain and PT eval.  Patient may require SNF placement depending on how he responds in therapy  -WBAT with a walker    Taiten Brawn L Tanasha Menees 03/10/2019, 8:29 AM

## 2019-03-10 NOTE — Care Management (Signed)
CM consult acknowledged to assist with any HH/DME or SNF needs. Awaiting PT/OT eval for DCP recommendations and will continue to follow.  Midge Minium RN, BSN, NCM-BC, ACM-RN [phone]

## 2019-03-11 DIAGNOSIS — M1712 Unilateral primary osteoarthritis, left knee: Secondary | ICD-10-CM

## 2019-03-11 LAB — CBC
HCT: 28 % — ABNORMAL LOW (ref 39.0–52.0)
Hemoglobin: 9.4 g/dL — ABNORMAL LOW (ref 13.0–17.0)
MCH: 31.8 pg (ref 26.0–34.0)
MCHC: 33.6 g/dL (ref 30.0–36.0)
MCV: 94.6 fL (ref 80.0–100.0)
Platelets: 172 10*3/uL (ref 150–400)
RBC: 2.96 MIL/uL — ABNORMAL LOW (ref 4.22–5.81)
RDW: 12.6 % (ref 11.5–15.5)
WBC: 7.6 10*3/uL (ref 4.0–10.5)
nRBC: 0 % (ref 0.0–0.2)

## 2019-03-11 NOTE — Progress Notes (Signed)
CSW met with patient to discuss plan of care and recommendation of SNF. Patient informed CSW that he would like to go home. CSW inquired about supports when home and he stated that he had several people that could assist with his care. Patient is amenable to [facility]. Patient stated that he has not HH in the past.  Consult being closed out for SNF.

## 2019-03-11 NOTE — Progress Notes (Signed)
Physical Therapy Treatment Patient Details Name: Carlos Barrera MRN: [id number] DOB: 1941-01-29 Today's Date: 03/11/2019    History of Present Illness Patient is a 78 year old male s/p L TKA. PMH includes: asthma, COPD, GERD, HLD, HTN    PT Comments    Pt making minimal progress and remains greatly limited secondary to L knee pain, fatigue, weakness and difficulty breathing. Pt required mod A for bed mobility and min guard for transfers with RW. Pt unable to tolerate ambulation this session secondary to increased WOB and SOB. Pt on RA throughout with SPO2 maintaining >94%. Pt would continue to benefit from skilled physical therapy services at this time while admitted and after d/c to address the below listed limitations in order to improve overall safety and independence with functional mobility.    Follow Up Recommendations  SNF;Supervision/Assistance - 24 hour     Equipment Recommendations  None recommended by PT    Recommendations for Other Services       Precautions / Restrictions Precautions Precautions: Fall;Knee Restrictions Weight Bearing Restrictions: Yes LLE Weight Bearing: Weight bearing as tolerated    Mobility  Bed Mobility Overal bed mobility: Needs Assistance Bed Mobility: Supine to Sit     Supine to sit: Mod assist     General bed mobility comments: increased time and effort needed, mod A for trunk elevation to achieve upright sitting towards his R side  Transfers Overall transfer level: Needs assistance Equipment used: Rolling walker (2 wheeled) Transfers: Sit to/from [employer] Transfers Sit to Stand: From elevated surface;Min guard Stand pivot transfers: Min guard       General transfer comment: bed in elevated position per pt request; pt performed sit<>stand from EOB x2 with min guard; pt only able to tolerate pivot to chair towards his R side; pt greatly limited secondary to increased WOB and SOB  Ambulation/Gait              General Gait Details: unable to tolerate this session secondary to increased WOB and SOB with very minimal activity; pt on RA throughout with SPO2 maintaining >94%   Stairs             Wheelchair Mobility    Modified Rankin (Stroke Patients Only)       Balance Overall balance assessment: Needs assistance Sitting-balance support: Feet supported Sitting balance-Leahy Scale: Fair     Standing balance support: Bilateral upper extremity supported;During functional activity Standing balance-Leahy Scale: Poor                              Cognition Arousal/Alertness: Awake/alert Behavior During Therapy: WFL for tasks assessed/performed;Anxious Overall Cognitive Status: Within Functional Limits for tasks assessed                                        Exercises      General Comments        Pertinent Vitals/Pain Pain Assessment: Faces Faces Pain Scale: Hurts even more Pain Location: L knee Pain Descriptors / Indicators: Aching;Grimacing;Guarding;Discomfort;Shooting;Cramping Pain Intervention(s): Monitored during session;Repositioned    Home Living                      Prior Function            PT Goals (current goals can now be found in the care plan section)  Acute Rehab PT Goals PT Goal Formulation: With patient Time For Goal Achievement: 03/17/19 Potential to Achieve Goals: Good Progress towards PT goals: Progressing toward goals    Frequency    7X/week      PT Plan Current plan remains appropriate    Co-evaluation              AM-PAC PT "6 Clicks" Mobility   Outcome Measure  Help needed turning from your back to your side while in a flat bed without using bedrails?: A Lot Help needed moving from lying on your back to sitting on the side of a flat bed without using bedrails?: A Lot Help needed moving to and from a bed to a chair (including a wheelchair)?: A Little Help needed standing up from a chair  using your arms (e.g., wheelchair or bedside chair)?: A Little Help needed to walk in hospital room?: A Lot Help needed climbing 3-5 steps with a railing? : A Lot 6 Click Score: 14    End of Session Equipment Utilized During Treatment: Gait belt Activity Tolerance: Patient limited by pain Patient left: in chair;with call bell/phone within reach;with chair alarm set Nurse Communication: Mobility status PT Visit Diagnosis: Pain;Other abnormalities of gait and mobility (R26.89) Pain - Right/Left: Left Pain - part of body: Knee     Time: 1230-1306 PT Time Calculation (min) (ACUTE ONLY): 36 min  Charges:  $Therapeutic Activity: 23-37 mins                     Anastasio Champion, DPT  Acute Rehabilitation Services Pager [phone] Office [place] 03/11/2019, 2:49 PM

## 2019-03-11 NOTE — Plan of Care (Signed)
  Problem: Activity: Goal: Ability to avoid complications of mobility impairment will improve Outcome: Progressing   Problem: Education: Goal: Verbalization of understanding the information provided will improve Outcome: Progressing

## 2019-03-11 NOTE — Plan of Care (Signed)
  Problem: Activity: Goal: Ability to avoid complications of mobility impairment will improve Outcome: Progressing   

## 2019-03-11 NOTE — Progress Notes (Addendum)
     Subjective: 2 Days Post-Op Procedure(s) (LRB): LEFT TOTAL KNEE ARTHROPLASTY (Left) Awake, alert and oriented x 4. Left knee replacement, first post op day difficult to control pain. Voiding without difficulty. Standing and some walking with PT but slow. May be ready to go home tomorrow.  Patient reports pain as moderate.    Objective:   VITALS:  Temp:  [98.2 F (36.8 C)-98.6 F (37 C)] 98.6 F (37 C) (11/21 0757) Pulse Rate:  [88-92] 91 (11/21 0757) Resp:  [16-17] 16 (11/21 0757) BP: (107-136)/(56-63) 107/57 (11/21 0757) SpO2:  [88 %-96 %] 88 % (11/21 0757)  Neurologically intact ABD soft Neurovascular intact Sensation intact distally Intact pulses distally Dorsiflexion/Plantar flexion intact Incision: dressing C/D/I and no drainage No cellulitis present Compartment soft   LABS No results for input(s): HGB, WBC, PLT in the last 72 hours. No results for input(s): NA, K, CL, CO2, BUN, CREATININE, GLUCOSE in the last 72 hours. No results for input(s): LABPT, INR in the last 72 hours.   Assessment/Plan: 2 Days Post-Op Procedure(s) (LRB): LEFT TOTAL KNEE ARTHROPLASTY (Left)  Advance diet Up with therapy D/C IV fluids Plan for discharge tomorrow  Basil Dess 03/11/2019, 11:25 AM Patient ID: Carlos Barrera, male   DOB: 07/06/1940, 78 y.o.   MRN: [id number]

## 2019-03-12 NOTE — Progress Notes (Signed)
  Subjective: Carlos Barrera is a 78 y.o. male s/p left TKA.  They are POD 3.  Pt's pain is moderate but controlled.  Pt denies numbness/tingling/weakness.  Pt has ambulated with great difficulty from his bed to his chair in the room.     Objective: Vital signs in last 24 hours: Temp:  [97.4 F (36.3 C)-98.8 F (37.1 C)] 98 F (36.7 C) (11/22 0820) Pulse Rate:  [91-98] 98 (11/22 0820) Resp:  [18-19] 18 (11/22 0820) BP: (111-133)/(58-67) 121/67 (11/22 0820) SpO2:  [91 %-97 %] 91 % (11/22 0820)  Intake/Output from previous day: 11/21 0701 - 11/22 0700 In: 480 [P.O.:480] Out: 1000 [Urine:1000] Intake/Output this shift: Total I/O In: 240 [P.O.:240] Out: 400 [Urine:400]  Exam:  No gross blood or drainage overlying the dressing 2+ DP pulse Sensation intact distally in the left foot Able to dorsiflex and plantarflex the left foot   Labs: Recent Labs    03/11/19 1223  HGB 9.4*   Recent Labs    03/11/19 1223  WBC 7.6  RBC 2.96*  HCT 28.0*  PLT 172   No results for input(s): NA, K, CL, CO2, BUN, CREATININE, GLUCOSE, CALCIUM in the last 72 hours. No results for input(s): LABPT, INR in the last 72 hours.  Assessment/Plan: Pt is POD 3 s/p left TKA.    -Plan to discharge to home versus SNF in the coming days pending patient's pain and PT eval.  Patient is more receptive to SNF discharge recently  -WBAT with a walker     Donella Stade 03/12/2019, 9:43 AM

## 2019-03-12 NOTE — Progress Notes (Signed)
Physical Therapy Treatment Patient Details Name: Carlos Barrera MRN: [id number] DOB: 09-13-1940 Today's Date: 03/12/2019    History of Present Illness Patient is a 78 year old male s/p L TKA. PMH includes: asthma, COPD, GERD, HLD, HTN    PT Comments    Patient progressing very slowly towards PT goals. Increased time and effort for all mobility with long rest breaks. Increased WOB with all transitions but VSS. Requires Mod A for bed mobility, Min guard assist for transfers and for taking a few steps to get to chair. Tolerated there ex sitting in chair. Limited knee extension AROM. Placed yellow foam to help with knee extension. Will plan for increasing gait training as able next session. Continue to recommend SNF as pt requiring hands on assist for all mobility and is limited with walking. If pt continues to refuse SNF, recommend HHPT.  Will follow.    Follow Up Recommendations  SNF;Supervision/Assistance - 24 hour(refusing SNF, so likely HHPT)     Equipment Recommendations  None recommended by PT    Recommendations for Other Services       Precautions / Restrictions Precautions Precautions: Fall;Knee Precaution Booklet Issued: No Precaution Comments: Reviewed precautions and no pillow under knee Restrictions Weight Bearing Restrictions: Yes LLE Weight Bearing: Weight bearing as tolerated    Mobility  Bed Mobility Overal bed mobility: Needs Assistance Bed Mobility: Supine to Sit     Supine to sit: Mod assist     General bed mobility comments: increased time and effort needed, mod A for trunk elevation to achieve upright sitting towards his Rt side as well as assist with LLE to get to EOB.  Transfers Overall transfer level: Needs assistance Equipment used: Rolling walker (2 wheeled) Transfers: Sit to/from Stand Sit to Stand: From elevated surface;Min guard         General transfer comment: Min guard for safety to stand from elevated bed height; slow to rise,  increased effort. Lots of rest breaks during mobility.  Ambulation/Gait Ambulation/Gait assistance: Min guard Gait Distance (Feet): 6 Feet Assistive device: Rolling walker (2 wheeled) Gait Pattern/deviations: Step-to pattern;Decreased stride length Gait velocity: decr   General Gait Details: Slow, unsteady gait with heavy reliance on BUEs and cues for knee extension during stance phase; self limiting due to pain. VSS.   Stairs             Wheelchair Mobility    Modified Rankin (Stroke Patients Only)       Balance Overall balance assessment: Needs assistance Sitting-balance support: Feet supported;Bilateral upper extremity supported Sitting balance-Leahy Scale: Fair     Standing balance support: During functional activity Standing balance-Leahy Scale: Poor Standing balance comment: Reliant on BUEs for support in standing.                            Cognition Arousal/Alertness: Awake/alert Behavior During Therapy: WFL for tasks assessed/performed Overall Cognitive Status: Within Functional Limits for tasks assessed                                        Exercises Total Joint Exercises Ankle Circles/Pumps: AROM;Both;10 reps Quad Sets: AROM;10 reps;Both Towel Squeeze: Strengthening;Both;10 reps;Seated Short Arc Quad: AAROM;Left;5 reps;Seated Hip ABduction/ADduction: AAROM;Left;10 reps    General Comments        Pertinent Vitals/Pain Pain Assessment: Faces Faces Pain Scale: Hurts even more Pain Location:  L knee Pain Descriptors / Indicators: Aching;Grimacing;Guarding;Discomfort Pain Intervention(s): Repositioned;Monitored during session;Premedicated before session    Home Living                      Prior Function            PT Goals (current goals can now be found in the care plan section) Progress towards PT goals: Progressing toward goals(slowly)    Frequency    7X/week      PT Plan Current plan  remains appropriate    Co-evaluation              AM-PAC PT "6 Clicks" Mobility   Outcome Measure  Help needed turning from your back to your side while in a flat bed without using bedrails?: A Lot Help needed moving from lying on your back to sitting on the side of a flat bed without using bedrails?: A Lot Help needed moving to and from a bed to a chair (including a wheelchair)?: A Little Help needed standing up from a chair using your arms (e.g., wheelchair or bedside chair)?: A Little Help needed to walk in hospital room?: A Little Help needed climbing 3-5 steps with a railing? : A Lot 6 Click Score: 15    End of Session Equipment Utilized During Treatment: Gait belt Activity Tolerance: Patient limited by pain Patient left: in chair;with call bell/phone within reach;with chair alarm set Nurse Communication: Mobility status PT Visit Diagnosis: Pain;Other abnormalities of gait and mobility (R26.89) Pain - Right/Left: Left Pain - part of body: Knee     Time: 5284-1324 PT Time Calculation (min) (ACUTE ONLY): 41 min  Charges:  $Gait Training: 8-22 mins $Therapeutic Exercise: 8-22 mins $Therapeutic Activity: 8-22 mins                     Marisa Severin, PT, DPT Acute Rehabilitation Services Pager [phone] Office [place] 03/12/2019, 8:40 AM

## 2019-03-12 NOTE — Progress Notes (Signed)
Physical Therapy Treatment Patient Details Name: Carlos Barrera MRN: [id number] DOB: 05-04-1940 Today's Date: 03/12/2019    History of Present Illness Patient is a 78 year old male s/p L TKA. PMH includes: asthma, COPD, GERD, HLD, HTN    PT Comments    Patient progressing slowly towards PT goals. Pt declined getting OOB this PM but agreeable to there ex. Tolerated there ex in supine and knee appeared less stiff this afternoon. Knee AROM 6-60 degrees. Pt now agreeable to SNF placement due to difficulty with transfers and ambulation. Education on importance of using CPM (which pt reports does not fit correctly), zero degree knee and mobilizing. Will continue to follow.   Follow Up Recommendations  SNF;Supervision/Assistance - 24 hour     Equipment Recommendations  None recommended by PT    Recommendations for Other Services       Precautions / Restrictions Precautions Precautions: Fall;Knee Precaution Booklet Issued: No Precaution Comments: Reviewed precautions and no pillow under knee Restrictions Weight Bearing Restrictions: Yes LLE Weight Bearing: Weight bearing as tolerated    Mobility  Bed Mobility               General bed mobility comments: Declined any OOB mobility; "I want to do exercises, I feel more confident with them than walking"  Transfers                 General transfer comment: Declined OOB mobility.  Ambulation/Gait                 Stairs             Wheelchair Mobility    Modified Rankin (Stroke Patients Only)       Balance                                            Cognition Arousal/Alertness: Awake/alert Behavior During Therapy: WFL for tasks assessed/performed Overall Cognitive Status: Within Functional Limits for tasks assessed                                        Exercises Total Joint Exercises Ankle Circles/Pumps: AROM;Both;10 reps Quad Sets: AROM;10 reps;Both(5  second hold) Short Arc Quad: AAROM;Left;5 reps;Supine Heel Slides: Left;10 reps;Supine;AROM Hip ABduction/ADduction: Left;10 reps;AROM;Supine Goniometric ROM: 6-60    General Comments        Pertinent Vitals/Pain Pain Assessment: Faces Faces Pain Scale: Hurts a little bit Pain Location: Lft  knee with exercise Pain Descriptors / Indicators: Sore Pain Intervention(s): Monitored during session    Home Living                      Prior Function            PT Goals (current goals can now be found in the care plan section) Progress towards PT goals: Progressing toward goals(slowly)    Frequency    7X/week      PT Plan Current plan remains appropriate    Co-evaluation              AM-PAC PT "6 Clicks" Mobility   Outcome Measure  Help needed turning from your back to your side while in a flat bed without using bedrails?: A Lot Help needed moving from lying on your back  to sitting on the side of a flat bed without using bedrails?: A Lot Help needed moving to and from a bed to a chair (including a wheelchair)?: A Little Help needed standing up from a chair using your arms (e.g., wheelchair or bedside chair)?: A Little Help needed to walk in hospital room?: A Little Help needed climbing 3-5 steps with a railing? : A Lot 6 Click Score: 15    End of Session   Activity Tolerance: Patient tolerated treatment well Patient left: in bed;with call bell/phone within reach;with bed alarm set Nurse Communication: Mobility status PT Visit Diagnosis: Pain;Other abnormalities of gait and mobility (R26.89) Pain - Right/Left: Left Pain - part of body: Knee     Time: 0177-9390 PT Time Calculation (min) (ACUTE ONLY): 28 min  Charges:  $Therapeutic Exercise: 23-37 mins                     Zettie Cooley, DPT Acute Rehabilitation Services Pager [phone] Office [place] 03/12/2019, 3:37 PM

## 2019-03-12 NOTE — Plan of Care (Signed)
  Problem: Activity: Goal: Ability to avoid complications of mobility impairment will improve Outcome: Progressing   

## 2019-03-12 NOTE — Plan of Care (Signed)
  Problem: Activity: Goal: Ability to avoid complications of mobility impairment will improve Outcome: Progressing Goal: Ability to tolerate increased activity will improve Outcome: Progressing   Problem: Education: Goal: Verbalization of understanding the information provided will improve Outcome: Progressing

## 2019-03-13 LAB — SARS CORONAVIRUS 2 (TAT 6-24 HRS): SARS Coronavirus 2: NEGATIVE

## 2019-03-13 NOTE — Plan of Care (Signed)
  Problem: Physical Regulation: Goal: Postoperative complications will be avoided or minimized Outcome: Progressing

## 2019-03-13 NOTE — Plan of Care (Signed)
  Problem: Activity: Goal: Ability to avoid complications of mobility impairment will improve Outcome: Progressing Goal: Ability to tolerate increased activity will improve Outcome: Progressing   Problem: Education: Goal: Verbalization of understanding the information provided will improve Outcome: Progressing   Problem: Skin Integrity: Goal: Signs of wound healing will improve Outcome: Progressing

## 2019-03-13 NOTE — TOC Progression Note (Signed)
Transition of Care [facility]) - Progression Note    Patient Details  Name: KABLE HAYWOOD MRN: [id number] Date of Birth: [birth year]  Transition of Care [facility]) CM/SW [place] RN, MSN, NCM-BC, [place] [phone] Phone Number: 03/13/2019, 1:57 PM  Clinical Narrative:    CM following for dispositional needs. CM met with the patient to discuss the POC. The patient is now agreeable to ST SNF placement; CM provided the patient with a CMS SNF placement list with [facility]. CM faxed the referral to the facility; awaiting response. CM explained the SNF process, with the patient verbalizing understanding;  additional facilities to be discussed if no bed availability at [facility]. CM team will continue to follow.    Expected Discharge Plan: [place] Barriers to Discharge: SNF Pending bed offer, Continued Medical Work up  Expected Discharge Plan and Services Expected Discharge Plan: [place] In-house Referral: Clinical Social Work Discharge Planning Services: CM Consult Post Acute Care Choice: [place] arrangements for the past 2 months: Single Family Home                 DME Arranged: N/A DME Agency: NA       HH Arranged: NA HH Agency: NA         Social Determinants of Health (SDOH) Interventions    Readmission Risk Interventions No flowsheet data found.

## 2019-03-13 NOTE — Progress Notes (Signed)
   03/13/19 1422  TOC Discharge Assessment  Barriers to Discharge Continued Medical Work up  Patient chooses bed at [facility]  Patient to be transferred to facility by [facility]   The patient has been accepted to [facility] [place], the facility of his choice, with a bed available tomorrow 03/14/19; pending COVID results. PTAR will be arranged for tomorrow.   Midge Minium RN, MSN, NCM-BC, ACM-RN [phone]

## 2019-03-13 NOTE — Progress Notes (Signed)
Physical Therapy Treatment Patient Details Name: Carlos Barrera MRN: [id number] DOB: 1940-06-28 Today's Date: 03/13/2019    History of Present Illness Patient is a 78 year old male s/p L TKA. PMH includes: asthma, COPD, GERD, HLD, HTN    PT Comments    Continuing work on functional mobility and activity tolerance;  Able to perform therex today and work on sit<>stand transfers; Mr. Lefferts moves very slowly and required extra time between reps of exercises and tasks today; Lots of encouragement to get OOB, but ultimately he chose to get back into the bed at end of session; Will consider bringing another person to assist next session with a chair push behind to hopefull boost his confidence in wlaking; continue to agree with post-acute rehab  Follow Up Recommendations  SNF;Supervision/Assistance - 24 hour     Equipment Recommendations  None recommended by PT    Recommendations for Other Services       Precautions / Restrictions Precautions Precautions: Fall;Knee Precaution Comments: Reviewed precautions and no pillow under knee Restrictions LLE Weight Bearing: Weight bearing as tolerated    Mobility  Bed Mobility Overal bed mobility: Needs Assistance Bed Mobility: Supine to Sit     Supine to sit: Mod assist     General bed mobility comments: Light mod assist to pull to sit; Employed RUE elbow to elbow holding method  Transfers Overall transfer level: Needs assistance Equipment used: Rolling walker (2 wheeled) Transfers: Sit to/from Stand Sit to Stand: Mod assist;Min assist(from low surface first, then elevated surface)         General transfer comment: Light mod assist to power up from low surface; Extra time for pt to prepare himself for transferring; second rep standing from elevated bed, min assist to steady; stood for less tahn 10 seconds each time, then would sit; second bout encouraged pt to pivot to chair, but he decided to get back in the  bed  Ambulation/Gait                 Stairs             Wheelchair Mobility    Modified Rankin (Stroke Patients Only)       Balance     Sitting balance-Leahy Scale: Fair       Standing balance-Leahy Scale: Poor                              Cognition Arousal/Alertness: Awake/alert Behavior During Therapy: WFL for tasks assessed/performed;Anxious(reports difficulty catching his breath) Overall Cognitive Status: Within Functional Limits for tasks assessed                                 General Comments: Takes a lot of time between tasks and reps of therex to catch his breath      Exercises Total Joint Exercises Ankle Circles/Pumps: AROM;Both;10 reps Quad Sets: AROM;10 reps;Both(noting hamstring co-contraction on L) Towel Squeeze: Strengthening;Both;10 reps;Seated Short Arc Quad: AAROM;Left;10 reps Heel Slides: AAROM;AROM;Left;10 reps Hip ABduction/ADduction: Left;10 reps;AROM;Supine Goniometric ROM: approx 2-80    General Comments        Pertinent Vitals/Pain Pain Assessment: Faces Faces Pain Scale: Hurts even more Pain Location: Lft  knee with exercise Pain Descriptors / Indicators: Sore Pain Intervention(s): Monitored during session    Home Living  Prior Function            PT Goals (current goals can now be found in the care plan section) Acute Rehab PT Goals Patient Stated Goal: More agreeable to rehab PT Goal Formulation: With patient Time For Goal Achievement: 03/17/19 Potential to Achieve Goals: Good Progress towards PT goals: Not progressing toward goals - comment(Limited by shortness of breath)    Frequency    7X/week      PT Plan Current plan remains appropriate    Co-evaluation              AM-PAC PT "6 Clicks" Mobility   Outcome Measure  Help needed turning from your back to your side while in a flat bed without using bedrails?: A Little Help needed  moving from lying on your back to sitting on the side of a flat bed without using bedrails?: A Little Help needed moving to and from a bed to a chair (including a wheelchair)?: A Lot Help needed standing up from a chair using your arms (e.g., wheelchair or bedside chair)?: A Little Help needed to walk in hospital room?: A Lot Help needed climbing 3-5 steps with a railing? : Total 6 Click Score: 14    End of Session Equipment Utilized During Treatment: Gait belt Activity Tolerance: Other (comment)(limited by shortness of breath (PMH of asthma)) Patient left: in bed;with call bell/phone within reach;Other (comment)(bed in semi-chair position with LLE propped to full knee ext) Nurse Communication: Mobility status PT Visit Diagnosis: Pain;Other abnormalities of gait and mobility (R26.89) Pain - Right/Left: Left Pain - part of body: Knee     Time: 5009-3818 PT Time Calculation (min) (ACUTE ONLY): 50 min  Charges:  $Therapeutic Exercise: 23-37 mins $Therapeutic Activity: 8-22 mins                     Roney Marion, PT  Acute Rehabilitation Services Pager [phone] Office [place] 03/13/2019, 12:26 PM

## 2019-03-13 NOTE — Progress Notes (Signed)
  Subjective: Carlos Barrera is a 78 y.o. male s/p left TKA.  They are POD 4.  Pt's pain is controlled.  Pt denies numbness/tingling/weakness.  Pt has ambulated with great difficulty.  He is only able to stand out of bed today in PT choosing to return to bed before attempting to walk.  Patient agrees with plan for discharge to SNF.   Objective: Vital signs in last 24 hours: Temp:  [98.2 F (36.8 C)-98.6 F (37 C)] 98.5 F (36.9 C) (11/23 0350) Pulse Rate:  [82-88] 88 (11/23 0350) Resp:  [16-19] 19 (11/23 0350) BP: (112-129)/(56-70) 124/56 (11/23 0350) SpO2:  [93 %-95 %] 93 % (11/23 0350)  Intake/Output from previous day: 11/22 0701 - 11/23 0700 In: 720 [P.O.:720] Out: 1600 [Urine:1600] Intake/Output this shift: No intake/output data recorded.  Exam:  No gross blood or drainage overlying the dressing 2+ DP pulse Sensation intact distally in the left foot Able to dorsiflex and plantarflex the left foot   Labs: Recent Labs    03/11/19 1223  HGB 9.4*   Recent Labs    03/11/19 1223  WBC 7.6  RBC 2.96*  HCT 28.0*  PLT 172   No results for input(s): NA, K, CL, CO2, BUN, CREATININE, GLUCOSE, CALCIUM in the last 72 hours. No results for input(s): LABPT, INR in the last 72 hours.  Assessment/Plan: Pt is POD 4 s/p left TKA.    -Plan to discharge to SNF in coming days  -Discussed patient with case manager  -WBAT with a walker     Richard Ritchey L Ladainian Therien 03/13/2019, 12:54 PM

## 2019-03-14 ENCOUNTER — Ambulatory Visit: Payer: No Typology Code available for payment source

## 2019-03-14 MED ORDER — ASPIRIN 81 MG PO CHEW: 81.0000 mg | CHEWABLE_TABLET | Freq: Every day | ORAL | 0 refills | Status: DC

## 2019-03-14 MED ORDER — METHOCARBAMOL 500 MG PO TABS: 500.0000 mg | ORAL_TABLET | Freq: Three times a day (TID) | ORAL | 0 refills | Status: DC | PRN

## 2019-03-14 MED ORDER — OXYCODONE HCL 5 MG PO TABS: 5.0000 mg | ORAL_TABLET | ORAL | 0 refills | Status: DC | PRN

## 2019-03-14 NOTE — TOC Transition Note (Addendum)
Transition of Care [facility]) - CM/SW Discharge Note   Patient Details  Name: Carlos Barrera MRN: [id number] Date of Birth: 1940-08-20  Transition of Care [facility]) CM/SW Contact:  Midge Minium MSN, RN, NCM-BC, ACM-RN [phone] Phone Number: 03/14/2019, 11:07 AM   Clinical Narrative:    Patient is medically stable to transition to [facility] for [place] rehab with the facility able to accept today. COVID results faxed to the facility. Awaiting DC summary. PTAR will be arranged once DC summary has been completed.   Please call report to: [phone]; room 801a (send all hard scripts)  Addendum: 03/15/19 @ 1334-Carlos Barrera RNCM-the outstanding insurance claim has been resolved, with the facility able to accept the patient to the facility today; the patient is aware. PTAR will be arranged.    Final next level of care: Skilled Nursing Facility Barriers to Discharge: No Barriers Identified   Patient Goals and CMS Choice Patient states their goals for this hospitalization and ongoing recovery are:: To get better CMS Medicare.gov Compare Post Acute Care list provided to:: Patient(Hand delivered to the patient for review) Choice offered to / list presented to : Patient(Hand delivered to the patient for review)  Discharge Placement              Patient chooses bed at: [facility] Patient to be transferred to facility by: [place] Name of family member notified: patient Patient and family notified of of transfer: 03/14/19  Discharge Plan and Services In-house Referral: Clinical Social Work Discharge Planning Services: [employer] Consult Post Acute Care Choice: [place]          DME Arranged: N/A DME Agency: NA       HH Arranged: NA HH Agency: NA        Social Determinants of Health (SDOH) Interventions     Readmission Risk Interventions No flowsheet data found.

## 2019-03-14 NOTE — Progress Notes (Signed)
Physical Therapy Treatment Patient Details Name: Carlos Barrera MRN: [id number] DOB: 1940/04/25 Today's Date: 03/14/2019    History of Present Illness Patient is a 78 year old male s/p L TKA. PMH includes: asthma, COPD, GERD, HLD, HTN    PT Comments    Continuing work on functional mobility and activity tolerance;  Needs more practice with therex for knee control - tends to muscle guard and co-contract hamstrings with short arc quads, and straight leg raises; notable improvements in bed mobility; Able to get to OOB to chair today, and take a few steps; declined progressive amb despite max encouragement; Agree with post-acute rehab at SNF to maximize independence and safety with mobility    Follow Up Recommendations  SNF;Supervision/Assistance - 24 hour     Equipment Recommendations  None recommended by PT    Recommendations for Other Services       Precautions / Restrictions Precautions Precautions: Fall;Knee Precaution Comments: Reviewed precautions and no pillow under knee Restrictions LLE Weight Bearing: Weight bearing as tolerated    Mobility  Bed Mobility Overal bed mobility: Needs Assistance Bed Mobility: Supine to Sit     Supine to sit: Min assist     General bed mobility comments: min assist to pull to sit; Employed RUE elbow to elbow holding method  Transfers Overall transfer level: Needs assistance Equipment used: Rolling walker (2 wheeled) Transfers: Sit to/from Stand Sit to Stand: Mod assist         General transfer comment: Light mod assist to stand from minimally elevated bed  Ambulation/Gait Ambulation/Gait assistance: Min assist Gait Distance (Feet): 3 Feet Assistive device: Rolling walker (2 wheeled) Gait Pattern/deviations: Step-to pattern;Decreased stride length Gait velocity: decr   General Gait Details: Small steps, cues for sequence; no overt L knee buckling noted; distance limited by shortness of breath/anxiety    Stairs             Wheelchair Mobility    Modified Rankin (Stroke Patients Only)       Balance     Sitting balance-Leahy Scale: Fair     Standing balance support: During functional activity Standing balance-Leahy Scale: Poor Standing balance comment: Reliant on BUEs for support in standing.                            Cognition Arousal/Alertness: Awake/alert Behavior During Therapy: WFL for tasks assessed/performed;Anxious(reports difficulty catching his breath) Overall Cognitive Status: Within Functional Limits for tasks assessed                                 General Comments: Takes a lot of time between tasks and reps of therex to catch his breath      Exercises Total Joint Exercises Short Arc QuadSinclair Ship;Left;10 reps Heel Slides: AAROM;AROM;Left;10 reps Straight Leg Raises: AAROM;Left;10 reps(self-AAROM with belt) Goniometric ROM: approx 0-80; can passivley get ot 0 placed in bone foam, and able to get to approx 80 deg flexion dangling at EOB    General Comments        Pertinent Vitals/Pain Pain Assessment: 0-10 Pain Score: 5 (at end of session) Pain Location: Lft  knee with exercise Pain Descriptors / Indicators: Sore Pain Intervention(s): Monitored during session    Home Living                      Prior Function  PT Goals (current goals can now be found in the care plan section) Acute Rehab PT Goals Patient Stated Goal: More agreeable to rehab PT Goal Formulation: With patient Time For Goal Achievement: 03/17/19 Potential to Achieve Goals: Good Progress towards PT goals: Progressing toward goals(slowly)    Frequency    7X/week      PT Plan Current plan remains appropriate    Co-evaluation              AM-PAC PT "6 Clicks" Mobility   Outcome Measure  Help needed turning from your back to your side while in a flat bed without using bedrails?: A Little Help needed moving from lying on your back to  sitting on the side of a flat bed without using bedrails?: A Little Help needed moving to and from a bed to a chair (including a wheelchair)?: A Little Help needed standing up from a chair using your arms (e.g., wheelchair or bedside chair)?: A Little Help needed to walk in hospital room?: A Lot Help needed climbing 3-5 steps with a railing? : Total 6 Click Score: 15    End of Session Equipment Utilized During Treatment: Gait belt Activity Tolerance: Other (comment)(limited by shortness of breath (PMH of asthma)) Patient left: in chair;with call bell/phone within reach;with chair alarm set Nurse Communication: Mobility status PT Visit Diagnosis: Pain;Other abnormalities of gait and mobility (R26.89) Pain - Right/Left: Left Pain - part of body: Knee     Time: 5053-9767 PT Time Calculation (min) (ACUTE ONLY): 39 min  Charges:  $Gait Training: 8-22 mins $Therapeutic Exercise: 8-22 mins $Therapeutic Activity: 8-22 mins                     Roney Marion, PT  Acute Rehabilitation Services Pager [phone] Office [place] 03/14/2019, 1:17 PM

## 2019-03-14 NOTE — Progress Notes (Signed)
Pt stable - for dc today to snf

## 2019-03-14 NOTE — Discharge Summary (Signed)
Physician Discharge Summary      Patient ID: Carlos Barrera MRN: [id number] DOB/AGE: Dec 06, 1940 78 y.o.  Admit date: 03/09/2019 Discharge date: 03/14/2019  Admission Diagnoses:  Active Problems:   Arthritis of left knee   Discharge Diagnoses:  Same  Surgeries: Procedure(s): LEFT TOTAL KNEE ARTHROPLASTY on 03/09/2019   Consultants:   Discharged Condition: Stable  Hospital Course: Carlos Barrera is an 78 y.o. 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  They were brought to the operating room on 03/09/2019 and underwent the above named procedures.  Pt awoke from anesthesia without complication and was transferred to the floor. On POD1, patient had extreme difficulty with ambulation. This improved throughout his stay but he still had moderate pain with ambulation and felt uncomfortable with ambulating solo.  He lives alone at home but has neighbors and friends that would help him occasionally.  Patient's physical therapists continued to recommend SNF placement throughout their evaluations.  Patient was discharged to SNF on 03/14/2019.  Pt will f/u with Dr. Marlou Sa in clinic in ~2 weeks.   Antibiotics given:  Anti-infectives (From admission, onward)   Start     Dose/Rate Route Frequency Ordered Stop   03/09/19 2200  ceFAZolin (ANCEF) IVPB 2g/100 mL premix     2 g 200 mL/hr over 30 Minutes Intravenous Every 6 hours 03/09/19 1700 03/10/19 0511   03/09/19 1130  ceFAZolin (ANCEF) IVPB 2g/100 mL premix     2 g 200 mL/hr over 30 Minutes Intravenous To [facility] 03/08/19 1330 03/09/19 1250    .  Recent vital signs:  Vitals:   03/14/19 0340 03/14/19 0834  BP: 118/60 130/63  Pulse: 85 82  Resp: 17 18  Temp: 98.2 F (36.8 C) 99 F (37.2 C)  SpO2: 93% 92%    Recent laboratory studies:  Results for orders placed or performed during the hospital encounter of 03/09/19  SARS  CORONAVIRUS 2 (TAT 6-24 HRS) Nasopharyngeal Nasopharyngeal Swab   Specimen: Nasopharyngeal Swab  Result Value Ref Range   SARS Coronavirus 2 NEGATIVE NEGATIVE  CBC  Result Value Ref Range   WBC 7.6 4.0 - 10.5 K/uL   RBC 2.96 (L) 4.22 - 5.81 MIL/uL   Hemoglobin 9.4 (L) 13.0 - 17.0 g/dL   HCT 28.0 (L) 39.0 - 52.0 %   MCV 94.6 80.0 - 100.0 fL   MCH 31.8 26.0 - 34.0 pg   MCHC 33.6 30.0 - 36.0 g/dL   RDW 12.6 11.5 - 15.5 %   Platelets 172 150 - 400 K/uL   nRBC 0.0 0.0 - 0.2 %    Discharge Medications:   Allergies as of 03/14/2019      Reactions   Bee Venom Anaphylaxis   Feldene [piroxicam] Hives      Medication List    STOP taking these medications   cephALEXin 500 MG capsule Commonly known as: KEFLEX     TAKE these medications   aspirin 81 MG chewable tablet Chew 1 tablet (81 mg total) by mouth daily.   Calcium Carbonate-Vitamin D 600-400 MG-UNIT tablet Take 1 tablet by mouth 2 (two) times daily.   cetirizine 10 MG tablet Commonly known as: ZYRTEC Take 10 mg by mouth daily.   guaifenesin 400 MG Tabs tablet Commonly known as: HUMIBID E Take 400 mg by mouth 3 (three) times daily.   levothyroxine 125 MCG tablet Commonly known as: SYNTHROID Take 125 mcg by  mouth daily before breakfast.   lisinopril 10 MG tablet Commonly known as: ZESTRIL Take 10 mg by mouth daily.   Meloxicam 15 MG Tbdp Take 15 mg by mouth daily as needed (pain.).   methocarbamol 500 MG tablet Commonly known as: ROBAXIN Take 1 tablet (500 mg total) by mouth every 8 (eight) hours as needed for muscle spasms.   omeprazole 20 MG capsule Commonly known as: PRILOSEC Take 20 mg by mouth 2 (two) times daily as needed (acid reflux/indigestion.).   oxyCODONE 5 MG immediate release tablet Commonly known as: Oxy IR/ROXICODONE Take 1-2 tablets (5-10 mg total) by mouth every 4 (four) hours as needed for moderate pain (pain score 4-6).   pravastatin 20 MG tablet Commonly known as: PRAVACHOL Take 20  mg by mouth at bedtime.   prazosin 5 MG capsule Commonly known as: MINIPRESS Take 10 mg by mouth at bedtime.   Refresh Tears 0.5 % Soln Generic drug: carboxymethylcellulose Place 1 drop into both eyes 2 (two) times daily.   sertraline 50 MG tablet Commonly known as: ZOLOFT Take 50 mg by mouth at bedtime.   tamsulosin 0.4 MG Caps capsule Commonly known as: FLOMAX Take 0.4 mg by mouth daily.   traZODone 50 MG tablet Commonly known as: DESYREL Take 50 mg by mouth at bedtime.       Diagnostic Studies: No results found.  Disposition: Discharge disposition: 03-Skilled Nursing Facility       Discharge Instructions    Call MD / Call 911   Complete by: As directed    If you experience chest pain or shortness of breath, CALL 911 and be transported to the hospital emergency room.  If you develope a fever above 101 F, pus (white drainage) or increased drainage or redness at the wound, or calf pain, call your surgeon's office.   Constipation Prevention   Complete by: As directed    Drink plenty of fluids.  Prune juice may be helpful.  You may use a stool softener, such as Colace (over the counter) 100 mg twice a day.  Use MiraLax (over the counter) for constipation as needed.   Diet - low sodium heart healthy   Complete by: As directed    Discharge instructions   Complete by: As directed    You may shower, dressing is waterproof.  Do not remove the dressing, we will remove it at your first post-op appointment.  Do not take a bath or soak the knee in a tub or pool.  You may weightbear as you can tolerate on the operative leg with a walker.  Continue using the CPM machine 3 times per day for one hour each time, increasing the degrees of range of motion daily.  Use the blue cradle boot under your heel to work on getting your leg straight.  Do NOT put a pillow under your knee.  You will follow-up with Dr. Marlou Sa in the clinic in 2 weeks at your given appointment date.   Increase activity  slowly as tolerated   Complete by: As directed       Follow-up Information    [facility] Preferred SNF Follow up.   Specialty: [place] Why: rehab Contact information: [address] [place] [phone]           Signed: Donella Stade 03/14/2019, 11:31 AM

## 2019-03-14 NOTE — Plan of Care (Signed)
  Problem: Activity: Goal: Ability to avoid complications of mobility impairment will improve Outcome: Progressing   

## 2019-03-15 NOTE — Progress Notes (Signed)
Physical Therapy Treatment Patient Details Name: Carlos Barrera MRN: [id number] DOB: 01/07/1941 Today's Date: 03/15/2019    History of Present Illness Patient is a 78 year old male s/p L TKA. PMH includes: asthma, COPD, GERD, HLD, HTN    PT Comments    Continuing work on functional mobility and activity tolerance;  Session focused on ambulation, which has been limited by shortness of breath in previous session (pmh of asthma); Able to walk a little further today; Carlos Barrera seems pleased to be going to rehab today  Follow Up Recommendations  SNF;Supervision/Assistance - 24 hour     Equipment Recommendations  None recommended by PT    Recommendations for Other Services       Precautions / Restrictions Precautions Precautions: Fall;Knee Precaution Comments: Reviewed precautions and no pillow under knee Restrictions LLE Weight Bearing: Weight bearing as tolerated    Mobility  Bed Mobility Overal bed mobility: Needs Assistance Bed Mobility: Supine to Sit     Supine to sit: Min assist     General bed mobility comments: min assist to pull to sit; Employed RUE elbow to elbow holding method  Transfers Overall transfer level: Needs assistance Equipment used: Rolling walker (2 wheeled) Transfers: Sit to/from Stand Sit to Stand: Min assist;From elevated surface(Bed quite elevated per pt request)         General transfer comment: min assist to steady RW  Ambulation/Gait Ambulation/Gait assistance: Min guard Gait Distance (Feet): 12 Feet Assistive device: Rolling walker (2 wheeled) Gait Pattern/deviations: Step-to pattern;Decreased stride length Gait velocity: decr   General Gait Details: Small steps, cues for sequence; no overt L knee buckling noted; one standing rest break, leaning against wall in room   Stairs             Wheelchair Mobility    Modified Rankin (Stroke Patients Only)       Balance             Standing balance-Leahy Scale:  Poor Standing balance comment: Reliant on BUEs for support in standing.                            Cognition Arousal/Alertness: Awake/alert Behavior During Therapy: WFL for tasks assessed/performed Overall Cognitive Status: Within Functional Limits for tasks assessed                                 General Comments: Takes a lot of time between tasks and reps of therex to catch his breath; pmh of asthma      Exercises      General Comments        Pertinent Vitals/Pain Pain Assessment: Faces Faces Pain Scale: Hurts even more Pain Location: Lft  knee with exercise Pain Descriptors / Indicators: Sore Pain Intervention(s): Monitored during session    Home Living                      Prior Function            PT Goals (current goals can now be found in the care plan section) Acute Rehab PT Goals Patient Stated Goal: More agreeable to rehab PT Goal Formulation: With patient Time For Goal Achievement: 03/17/19 Potential to Achieve Goals: Good Progress towards PT goals: Progressing toward goals    Frequency    7X/week      PT Plan Current plan remains appropriate  Co-evaluation              AM-PAC PT "6 Clicks" Mobility   Outcome Measure  Help needed turning from your back to your side while in a flat bed without using bedrails?: A Little Help needed moving from lying on your back to sitting on the side of a flat bed without using bedrails?: A Little Help needed moving to and from a bed to a chair (including a wheelchair)?: A Little Help needed standing up from a chair using your arms (e.g., wheelchair or bedside chair)?: A Little Help needed to walk in hospital room?: A Little Help needed climbing 3-5 steps with a railing? : Total 6 Click Score: 16    End of Session Equipment Utilized During Treatment: Gait belt   Patient left: in chair;with call bell/phone within reach;with chair alarm set Nurse Communication:  Mobility status PT Visit Diagnosis: Pain;Other abnormalities of gait and mobility (R26.89) Pain - Right/Left: Left Pain - part of body: Knee     Time: 8889-1694 PT Time Calculation (min) (ACUTE ONLY): 18 min  Charges:  $Gait Training: 8-22 mins                     Roney Marion, PT  [place] Pager [phone] Office [place] 03/15/2019, 3:56 PM

## 2019-03-15 NOTE — Progress Notes (Signed)
Patient cleared for discharge to SNF.    No change to previous discharge summary submitted on 03/14/19

## 2019-03-15 NOTE — Plan of Care (Signed)
  Problem: Activity: Goal: Ability to avoid complications of mobility impairment will improve Outcome: Progressing   Problem: Activity: Goal: Risk for activity intolerance will decrease Outcome: Progressing

## 2019-03-15 NOTE — Progress Notes (Signed)
  Subjective: Carlos Barrera is a 78 y.o. male s/p left TKA.  They are POD6.  Pt's pain is controlled.  Pt denies numbness/tingling/weakness.  Pt has ambulated with great difficulty.  He is awaiting discharge to SNF.   Objective: Vital signs in last 24 hours: Temp:  [97.8 F (36.6 C)-98.6 F (37 C)] 98.6 F (37 C) (11/25 0820) Pulse Rate:  [83-96] 83 (11/25 0820) Resp:  [15-18] 18 (11/25 0820) BP: (112-142)/(60-71) 119/71 (11/25 0820) SpO2:  [94 %-96 %] 94 % (11/25 0820)  Intake/Output from previous day: 11/24 0701 - 11/25 0700 In: 480 [P.O.:480] Out: 800 [Urine:800] Intake/Output this shift: Total I/O In: 240 [P.O.:240] Out: 200 [Urine:200]  Exam:  No gross blood or drainage overlying the dressing 2+ DP pulse Sensation intact distally in the left foot Able to dorsiflex and plantarflex the left foot   Labs: No results for input(s): HGB in the last 72 hours. No results for input(s): WBC, RBC, HCT, PLT in the last 72 hours. No results for input(s): NA, K, CL, CO2, BUN, CREATININE, GLUCOSE, CALCIUM in the last 72 hours. No results for input(s): LABPT, INR in the last 72 hours.  Assessment/Plan: Pt is POD6 s/p left TKA.    -Plan to discharge to SNF today or tomorrow pending resolution of insurance problem with SNF.  Case management team is following and updating. Appreciate their help  -WBAT with a walker     Noah Lembke L Jonan Seufert 03/15/2019, 11:45 AM

## 2019-03-15 NOTE — Plan of Care (Addendum)
Called [facility] and gave report. Pt stated he had no questions about discharge at this time and denied pain. Belongings returned to pt. Script for narcotic in pt's drawer outside the room with paperwork for [facility]AR to take to SNF.   Problem: Activity: Goal: Ability to avoid complications of mobility impairment will improve Outcome: Completed/Met Goal: Ability to tolerate increased activity will improve Outcome: Completed/Met   Problem: Education: Goal: Verbalization of understanding the information provided will improve Outcome: Completed/Met   Problem: Physical Regulation: Goal: Postoperative complications will be avoided or minimized Outcome: Completed/Met   Problem: Skin Integrity: Goal: Signs of wound healing will improve Outcome: Completed/Met   Problem: Education: Goal: Knowledge of General Education information will improve Description: Including pain rating scale, medication(s)/side effects and non-pharmacologic comfort measures Outcome: Completed/Met   Problem: Health Behavior/Discharge Planning: Goal: Ability to manage health-related needs will improve Outcome: Completed/Met   Problem: Clinical Measurements: Goal: Ability to maintain clinical measurements within normal limits will improve Outcome: Completed/Met Goal: Will remain free from infection Outcome: Completed/Met Goal: Diagnostic test results will improve Outcome: Completed/Met Goal: Respiratory complications will improve Outcome: Completed/Met Goal: Cardiovascular complication will be avoided Outcome: Completed/Met   Problem: Activity: Goal: Risk for activity intolerance will decrease Outcome: Completed/Met   Problem: Nutrition: Goal: Adequate nutrition will be maintained Outcome: Completed/Met   Problem: Coping: Goal: Level of anxiety will decrease Outcome: Completed/Met   Problem: Elimination: Goal: Will not experience complications related to bowel motility Outcome: Completed/Met Goal:  Will not experience complications related to urinary retention Outcome: Completed/Met   Problem: Pain Managment: Goal: General experience of comfort will improve Outcome: Completed/Met   Problem: Safety: Goal: Ability to remain free from injury will improve Outcome: Completed/Met   Problem: Skin Integrity: Goal: Risk for impaired skin integrity will decrease Outcome: Completed/Met

## 2019-03-15 NOTE — Care Management (Signed)
Patient has an open medical claim which the Admissions Coordinator [facility] is working with the patient and the patients insurance company to resolve prior to discharge. CM team will continue to follow.   Midge Minium MSN, RN, NCM-BC, ACM-RN [phone]

## 2019-03-21 ENCOUNTER — Telehealth: Payer: Self-pay | Admitting: *Deleted

## 2019-03-21 ENCOUNTER — Ambulatory Visit: Payer: No Typology Code available for payment source

## 2019-03-21 ENCOUNTER — Encounter: Payer: Self-pay | Admitting: *Deleted

## 2019-03-21 DIAGNOSIS — R0600 Dyspnea, unspecified: Secondary | ICD-10-CM

## 2019-03-21 NOTE — Telephone Encounter (Signed)
Called to check on pt.  Had TKR on 03/09/19 and sent SNF.  Left message.

## 2019-03-22 ENCOUNTER — Encounter: Payer: Self-pay | Admitting: *Deleted

## 2019-03-22 DIAGNOSIS — R0600 Dyspnea, unspecified: Secondary | ICD-10-CM

## 2019-03-22 NOTE — Progress Notes (Signed)
Pulmonary Individual Treatment Plan  Patient Details  Name: Carlos Barrera MRN: [id number] Date of Birth: 1940/11/08 Referring Provider:     Pulmonary Rehab from 12/07/2018 in [facility]  Referring Provider  [name]      Initial Encounter Date:    Pulmonary Rehab from 12/07/2018 in [facility] and Pulmonary Rehab  Date  12/07/18      Visit Diagnosis: Dyspnea, unspecified type  Patient's Home Medications on Admission:  Current Outpatient Medications:  .  aspirin 81 MG chewable tablet, Chew 1 tablet (81 mg total) by mouth daily., Disp: 30 tablet, Rfl: 0 .  Calcium Carbonate-Vitamin D 600-400 MG-UNIT tablet, Take 1 tablet by mouth 2 (two) times daily. , Disp: , Rfl:  .  cetirizine (ZYRTEC) 10 MG tablet, Take 10 mg by mouth daily. , Disp: , Rfl:  .  guaifenesin (HUMIBID E) 400 MG TABS tablet, Take 400 mg by mouth 3 (three) times daily., Disp: , Rfl:  .  levothyroxine (SYNTHROID) 125 MCG tablet, Take 125 mcg by mouth daily before breakfast., Disp: , Rfl:  .  lisinopril (PRINIVIL,ZESTRIL) 10 MG tablet, Take 10 mg by mouth daily., Disp: , Rfl:  .  Meloxicam 15 MG TBDP, Take 15 mg by mouth daily as needed (pain.). , Disp: , Rfl:  .  methocarbamol (ROBAXIN) 500 MG tablet, Take 1 tablet (500 mg total) by mouth every 8 (eight) hours as needed for muscle spasms., Disp: 30 tablet, Rfl: 0 .  omeprazole (PRILOSEC) 20 MG capsule, Take 20 mg by mouth 2 (two) times daily as needed (acid reflux/indigestion.). , Disp: , Rfl:  .  oxyCODONE (OXY IR/ROXICODONE) 5 MG immediate release tablet, Take 1-2 tablets (5-10 mg total) by mouth every 4 (four) hours as needed for moderate pain (pain score 4-6)., Disp: 40 tablet, Rfl: 0 .  pravastatin (PRAVACHOL) 20 MG tablet, Take 20 mg by mouth at bedtime., Disp: , Rfl:  .  prazosin (MINIPRESS) 5 MG capsule, Take 10 mg by mouth at bedtime. , Disp: , Rfl:  .  REFRESH TEARS 0.5 % SOLN, Place 1 drop into both eyes 2 (two) times daily. , Disp:  , Rfl:  .  sertraline (ZOLOFT) 50 MG tablet, Take 50 mg by mouth at bedtime. , Disp: , Rfl:  .  tamsulosin (FLOMAX) 0.4 MG CAPS capsule, Take 0.4 mg by mouth daily. , Disp: , Rfl:  .  traZODone (DESYREL) 50 MG tablet, Take 50 mg by mouth at bedtime., Disp: , Rfl:   Current Facility-Administered Medications:  .  Leuprolide Acetate (6 Month) (LUPRON) injection 45 mg, 45 mg, Intramuscular, Once, Stoioff, Ronda Fairly, MD  Past Medical History: Past Medical History:  Diagnosis Date  . Asthma    exercise induced asthma   . COPD (chronic obstructive pulmonary disease) ([place])   . GERD (gastroesophageal reflux disease)   . Hearing loss    wears hearing aids  . Hyperlipidemia   . Hypertension   . Hypothyroidism   . Insomnia   . Prostate cancer ([place])   . PTSD (post-traumatic stress disorder)   . Seasonal allergies   . Thyroid disease     Tobacco Use: Social History   Tobacco Use  Smoking Status Former Smoker  . Packs/day: 2.00  . Years: 20.00  . Pack years: 40.00  . Types: Cigarettes  . Quit date: 04/20/1980  . Years since quitting: 38.9  Smokeless Tobacco Never Used    Labs: Recent Review Flowsheet Data    There is no  flowsheet data to display.       Pulmonary Assessment Scores: Pulmonary Assessment Scores    Row Name 12/07/18 1316         ADL UCSD   SOB Score total  62     Rest  1     Walk  2     Stairs  5     Bath  2     Dress  2     Shop  2       CAT Score   CAT Score  29       mMRC Score   mMRC Score  3        UCSD: Self-administered rating of dyspnea associated with activities of daily living (ADLs) 6-point scale (0 = "not at all" to 5 = "maximal or unable to do because of breathlessness")  Scoring Scores range from 0 to 120.  Minimally important difference is 5 units  CAT: CAT can identify the health impairment of COPD patients and is better correlated with disease progression.  CAT has a scoring range of zero to 40. The CAT score is classified into  four groups of low (less than 10), medium (10 - 20), high (21-30) and very high (31-40) based on the impact level of disease on health status. A CAT score over 10 suggests significant symptoms.  A worsening CAT score could be explained by an exacerbation, poor medication adherence, poor inhaler technique, or progression of COPD or comorbid conditions.  CAT MCID is 2 points  mMRC: mMRC (Modified Medical Research Council) Dyspnea Scale is used to assess the degree of baseline functional disability in patients of respiratory disease due to dyspnea. No minimal important difference is established. A decrease in score of 1 point or greater is considered a positive change.   Pulmonary Function Assessment:   Exercise Target Goals: Exercise Program Goal: Individual exercise prescription set using results from initial 6 min walk test and THRR while considering  patient's activity barriers and safety.   Exercise Prescription Goal: Initial exercise prescription builds to 30-45 minutes a day of aerobic activity, 2-3 days per week.  Home exercise guidelines will be given to patient during program as part of exercise prescription that the participant will acknowledge.  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 HR  101  bpm     Max Ex. BP  124/58  2 Minute Post BP  112/58       Interval HR   1 Minute HR  92     2 Minute HR  70 patient holding hands behind back     3 Minute HR  - sig loss     4 Minute HR  60 see previous note - not sure HR accurate     2 Minute Post HR  78     Interval Heart Rate?  Yes       Interval Oxygen   Interval Oxygen?  Yes     Baseline Oxygen Saturation %  96 %     1 Minute Oxygen Saturation %  96 %     1 Minute Liters of Oxygen  0 L     2 Minute Oxygen Saturation %  95 %     2 Minute Liters of Oxygen  0 L     3 Minute Oxygen Saturation %  96 %     3 Minute Liters of Oxygen  0 L     4 Minute Oxygen Saturation %  95 %     4 Minute Liters of Oxygen  0 L     2 Minute Post Oxygen Saturation %  96 %     2 Minute Post Liters of Oxygen  0 L       Oxygen Initial Assessment: Oxygen Initial Assessment - 01/10/19 1320      Home Oxygen   Home Oxygen Device  --    Sleep Oxygen Prescription  --    Home Exercise Oxygen Prescription  --    Home at Rest Exercise Oxygen Prescription  --      Program Oxygen Prescription   Program Oxygen Prescription  --      Intervention   Short Term Goals  --    Long  Term Goals  --       Oxygen Re-Evaluation: Oxygen Re-Evaluation    Row Name 01/10/19 1345 02/02/19 1042 03/02/19 1052         Program Oxygen Prescription   Program Oxygen Prescription  None  None  None       Home Oxygen   Home Oxygen Device  None  None  None     Sleep Oxygen Prescription  None  None  None     Home Exercise Oxygen Prescription  None  None  None     Home at Rest Exercise Oxygen Prescription  None  None  None       Goals/Expected Outcomes   Short Term Goals  To learn and demonstrate proper use of respiratory medications;To learn and demonstrate proper pursed lip breathing techniques or other breathing techniques.;To learn and understand importance of maintaining oxygen saturations>88%;To learn and understand importance of monitoring SPO2 with pulse  oximeter and demonstrate accurate use of the pulse oximeter.  To learn and demonstrate proper pursed lip breathing techniques or other breathing techniques.;To learn and understand importance of maintaining oxygen saturations>88%;To learn and understand importance of monitoring SPO2 with pulse oximeter and demonstrate accurate use of the pulse oximeter.  To learn and demonstrate proper pursed lip breathing techniques or other breathing techniques.;To learn and understand importance of maintaining oxygen saturations>88%;To learn and understand importance of monitoring SPO2 with pulse oximeter and demonstrate accurate use of the pulse oximeter.     Long  Term Goals  Verbalizes importance of monitoring SPO2 with pulse oximeter and return demonstration;Maintenance of O2 saturations>88%;Exhibits proper breathing techniques, such as pursed lip  breathing or other method taught during program session;Compliance with respiratory medication;Demonstrates proper use of MDI's  Maintenance of O2 saturations>88%;Exhibits proper breathing techniques, such as pursed lip breathing or other method taught during program session;Verbalizes importance of monitoring SPO2 with pulse oximeter and return demonstration  Maintenance of O2 saturations>88%;Exhibits proper breathing techniques, such as pursed lip breathing or other method taught during program session;Verbalizes importance of monitoring SPO2 with pulse oximeter and return demonstration     Comments  Patient states that his respiratory medications other than Albuterol are not helping him. Informed him to speak with his pulmonologist about possible going over his medications and seeing if he can get something to help with his breathing. He feels like nothing is working for his mantainence drugs.  He does not have a pulse oximeter to check her oxygen saturation at home. Informed him where to get one and explained why it is important to have one. Practiced PLB with patient.  Patient understands why PLB is important and to use it when he is short of breath. Reviewed that oxygen saturations should be 88 percent and above. He is meeting with his doctor soon to talk about his medications and his breathing.  Participant is practicing PLB during exercise. Encouraged to use outside of program as well. Having CT scan of lungs next Wednesday, trying to find reasons for shortness of breath and fatigue. Participant has not been able to purchase an oximeter for home. He reports it is always the same.     Goals/Expected Outcomes  Short: Speak with pulmonologist about medications. Long: take medications prescribed properly.  Short: use PLB with exertion. Long: use PLB on exertion proficiently and independently.  Short: Use PLB throughout the day at home. Long: Use PLB before becoming too short of breath and also to relax        Oxygen Discharge (Final Oxygen Re-Evaluation): Oxygen Re-Evaluation - 03/02/19 1052      Program Oxygen Prescription   Program Oxygen Prescription  None      Home Oxygen   Home Oxygen Device  None    Sleep Oxygen Prescription  None    Home Exercise Oxygen Prescription  None    Home at Rest Exercise Oxygen Prescription  None      Goals/Expected Outcomes   Short Term Goals  To learn and demonstrate proper pursed lip breathing techniques or other breathing techniques.;To learn and understand importance of maintaining oxygen saturations>88%;To learn and understand importance of monitoring SPO2 with pulse oximeter and demonstrate accurate use of the pulse oximeter.    Long  Term Goals  Maintenance of O2 saturations>88%;Exhibits proper breathing techniques, such as pursed lip breathing or other method taught during program session;Verbalizes importance of monitoring SPO2 with pulse oximeter and return demonstration    Comments  Participant is practicing PLB during exercise. Encouraged to use outside of program as well. Having CT scan of lungs next Wednesday,  trying to find reasons for shortness of breath and fatigue. Participant has not been able to purchase an oximeter for home. He reports it is always the same.    Goals/Expected Outcomes  Short: Use PLB throughout the day at home. Long: Use PLB before becoming too short of breath and also to relax       Initial Exercise Prescription: Initial Exercise Prescription - 12/07/18 1300      Date of Initial Exercise RX and Referring Provider   Date  12/07/18    Referring Provider  [name]      Treadmill  MPH  1    Grade  0    Minutes  15   1 min then rest   METs  1.2      NuStep   Level  1    SPM  80    Minutes  15    METs  1.2      Arm Ergometer   Level  1    RPM  50    Minutes  15    METs  1.2      Biostep-RELP   Level  1    SPM  50    Minutes  15    METs  2      Prescription Details   Frequency (times per week)  2    Duration  Progress to 30 minutes of continuous aerobic without signs/symptoms of physical distress      Intensity   THRR 40-80% of Max Heartrate  104-130    Ratings of Perceived Exertion  11-13    Perceived Dyspnea  0-4      Progression   Progression  Continue to progress workloads to maintain intensity without signs/symptoms of physical distress.      Resistance Training   Training Prescription  Yes    Weight  3 lb    Reps  10-15       Perform Capillary Blood Glucose checks as needed.  Exercise Prescription Changes: Exercise Prescription Changes    Row Name 12/07/18 1300 12/20/18 1500 01/06/19 0700 01/17/19 1400 01/31/19 1100     Response to Exercise   Blood Pressure (Admit)  106/48  112/68  126/56  142/64  132/74   Blood Pressure (Exercise)  124/58  128/60  130/60  128/64  156/84   Blood Pressure (Exit)  112/58  94/68  110/54  120/64  110/58   Heart Rate (Admit)  101 bpm  95 bpm  75 bpm  64 bpm  75 bpm   Heart Rate (Exercise)  92 bpm  88 bpm  86 bpm  84 bpm  98 bpm   Heart Rate (Exit)  78 bpm  80 bpm  72 bpm  72 bpm  60 bpm   Oxygen  Saturation (Admit)  96 %  97 %  94 %  94 %  92 %   Oxygen Saturation (Exercise)  95 %  93 %  94 %  94 %  94 %   Oxygen Saturation (Exit)  96 %  98 %  97 %  97 %  98 %   Rating of Perceived Exertion (Exercise)  '17  11  15  11  15   ' Perceived Dyspnea (Exercise)  '4  2  3  3  4   ' Symptoms  none  none  none  none  SOB   Duration  Progress to 30 minutes of  aerobic without signs/symptoms of physical distress  Progress to 30 minutes of  aerobic without signs/symptoms of physical distress  Continue with 30 min of aerobic exercise without signs/symptoms of physical distress.  Continue with 30 min of aerobic exercise without signs/symptoms of physical distress.  Continue with 30 min of aerobic exercise without signs/symptoms of physical distress.   Intensity  -  -  -  THRR unchanged  THRR unchanged     Progression   Progression  Continue to progress workloads to maintain intensity without signs/symptoms of physical distress.  Continue to progress workloads to maintain intensity without signs/symptoms of physical distress.  Continue to progress workloads  to maintain intensity without signs/symptoms of physical distress.  Continue to progress workloads to maintain intensity without signs/symptoms of physical distress.  Continue to progress workloads to maintain intensity without signs/symptoms of physical distress.   Average METs  -  -  -  2.5  2.85     Resistance Training   Training Prescription  -  Yes  Yes  Yes  Yes   Weight  -  3 lb  3 lb  3 lbs  3 lbs   Reps  -  10-15  10-15  10-15  10-15     Interval Training   Interval Training  -  -  -  No  No     NuStep   Level  -  '4  3  3  2   ' SPM  -  80  80  -  -   Minutes  -  '15  15  30  30   ' METs  -  2  3.1  3.1  3.7     Biostep-RELP   Level  -  -  '1  1  1   ' SPM  -  -  50  -  -   Minutes  -  -  '30  30  30   ' METs  -  -  '2  2  2     ' Home Exercise Plan   Plans to continue exercise at  -  -  -  Home (comment) walking  Home (comment) walking    Frequency  -  -  -  Add 2 additional days to program exercise sessions.  Add 2 additional days to program exercise sessions.   Initial Home Exercises Provided  -  -  -  12/27/18  12/27/18   Row Name 02/14/19 1300 02/14/19 1400 03/01/19 1000         Response to Exercise   Blood Pressure (Admit)  132/58  -  122/60     Blood Pressure (Exercise)  142/60  -  134/54     Blood Pressure (Exit)  100/60  -  108/58     Heart Rate (Admit)  98 bpm  -  95 bpm     Heart Rate (Exercise)  98 bpm  -  96 bpm     Heart Rate (Exit)  101 bpm  -  90 bpm     Oxygen Saturation (Admit)  98 %  -  96 %     Oxygen Saturation (Exercise)  95 %  -  96 %     Oxygen Saturation (Exit)  98 %  -  97 %     Rating of Perceived Exertion (Exercise)  13  -  13     Perceived Dyspnea (Exercise)  4  -  3     Symptoms  -  -  SOB     Duration  Continue with 30 min of aerobic exercise without signs/symptoms of physical distress.  -  Continue with 30 min of aerobic exercise without signs/symptoms of physical distress.     Intensity  THRR unchanged  -  THRR unchanged       Progression   Progression  Continue to progress workloads to maintain intensity without signs/symptoms of physical distress.  -  Continue to progress workloads to maintain intensity without signs/symptoms of physical distress.     Average METs  2.7  -  2.35       Resistance Training   Training Prescription  Yes  -  Yes     Weight  3 lb  -  3 lbs     Reps  10-15  -  10-15       Interval Training   Interval Training  No  -  No       NuStep   Level  3  -  3     Minutes  30  -  15     METs  2.7  -  2.7       Biostep-RELP   Level  -  -  1     Minutes  -  -  15     METs  -  -  2       Home Exercise Plan   Plans to continue exercise at  -  Home (comment) walking  Home (comment) walking     Frequency  -  Add 2 additional days to program exercise sessions.  Add 2 additional days to program exercise sessions.     Initial Home Exercises Provided  -  12/27/18   12/27/18        Exercise Comments: Exercise Comments    Row Name 12/13/18 1302 12/27/18 1056         Exercise Comments  First full day of exercise!  Patient was oriented to gym and equipment including functions, settings, policies, and procedures.  Patient's individual exercise prescription and treatment plan were reviewed.  All starting workloads were established based on the results of the 6 minute walk test done at initial orientation visit.  The plan for exercise progression was also introduced and progression will be customized based on patient's performance and goals.  Reviewed home exercise with pt today.  Pt plans to walk and look into [facility] for exercise.  Reviewed THR, pulse, RPE, sign and symptoms, NTG use, and when to call 911 or MD.  Also discussed weather considerations and indoor options.  Pt voiced understanding.  Buds shoulder has been sore since last session so he skipped the Arm crank today.         Exercise Goals and Review: Exercise Goals    Row Name 12/07/18 1309             Exercise Goals   Increase Physical Activity  Yes       Intervention  Provide advice, education, support and counseling about physical activity/exercise needs.;Develop an individualized exercise prescription for aerobic and resistive training based on initial evaluation findings, risk stratification, comorbidities and participant's personal goals.       Expected Outcomes  Short Term: Attend rehab on a regular basis to increase amount of physical activity.;Long Term: Add in home exercise to make exercise part of routine and to increase amount of physical activity.;Long Term: Exercising regularly at least 3-5 days a week.       Increase Strength and Stamina  Yes       Intervention  Provide advice, education, support and counseling about physical activity/exercise needs.;Develop an individualized exercise prescription for aerobic and resistive training based on initial evaluation findings, risk  stratification, comorbidities and participant's personal goals.       Expected Outcomes  Short Term: Increase workloads from initial exercise prescription for resistance, speed, and METs.;Short Term: Perform resistance training exercises routinely during rehab and add in resistance training at home;Long Term: Improve cardiorespiratory fitness, muscular endurance and strength as measured by increased METs and functional capacity (6MWT)       Able to understand and  use rate of perceived exertion (RPE) scale  Yes       Intervention  Provide education and explanation on how to use RPE scale       Expected Outcomes  Short Term: Able to use RPE daily in rehab to express subjective intensity level;Long Term:  Able to use RPE to guide intensity level when exercising independently       Able to understand and use Dyspnea scale  Yes       Intervention  Provide education and explanation on how to use Dyspnea scale       Expected Outcomes  Short Term: Able to use Dyspnea scale daily in rehab to express subjective sense of shortness of breath during exertion;Long Term: Able to use Dyspnea scale to guide intensity level when exercising independently       Knowledge and understanding of Target Heart Rate Range (THRR)  Yes       Intervention  Provide education and explanation of THRR including how the numbers were predicted and where they are located for reference       Expected Outcomes  Short Term: Able to state/look up THRR;Short Term: Able to use daily as guideline for intensity in rehab;Long Term: Able to use THRR to govern intensity when exercising independently       Able to check pulse independently  Yes       Intervention  Provide education and demonstration on how to check pulse in carotid and radial arteries.;Review the importance of being able to check your own pulse for safety during independent exercise       Expected Outcomes  Short Term: Able to explain why pulse checking is important during independent  exercise;Long Term: Able to check pulse independently and accurately       Understanding of Exercise Prescription  Yes       Intervention  Provide education, explanation, and written materials on patient's individual exercise prescription       Expected Outcomes  Short Term: Able to explain program exercise prescription;Long Term: Able to explain home exercise prescription to exercise independently          Exercise Goals Re-Evaluation : Exercise Goals Re-Evaluation    Row Name 12/13/18 1303 12/20/18 1545 12/27/18 1056 01/06/19 0736 01/17/19 1410     Exercise Goal Re-Evaluation   Exercise Goals Review  Increase Physical Activity;Increase Strength and Stamina;Able to understand and use rate of perceived exertion (RPE) scale;Able to understand and use Dyspnea scale;Knowledge and understanding of Target Heart Rate Range (THRR);Able to check pulse independently;Understanding of Exercise Prescription  Increase Physical Activity;Increase Strength and Stamina;Able to understand and use rate of perceived exertion (RPE) scale;Able to understand and use Dyspnea scale;Knowledge and understanding of Target Heart Rate Range (THRR);Able to check pulse independently;Understanding of Exercise Prescription  Increase Physical Activity;Increase Strength and Stamina;Able to understand and use rate of perceived exertion (RPE) scale;Knowledge and understanding of Target Heart Rate Range (THRR);Able to check pulse independently;Understanding of Exercise Prescription  Increase Physical Activity;Able to understand and use rate of perceived exertion (RPE) scale;Increase Strength and Stamina;Able to understand and use Dyspnea scale;Knowledge and understanding of Target Heart Rate Range (THRR);Able to check pulse independently;Understanding of Exercise Prescription  Increase Physical Activity;Increase Strength and Stamina;Understanding of Exercise Prescription   Comments  Reviewed RPE scale, THR and program prescription with pt  today.  Pt voiced understanding and was given a copy of goals to take home.  Buds knee gives him a lot of trouble with exercise and he  only uses one leg a lot of times.  he has progressed workloads and is reporting less SOB with exercise.  he started with working for 1-2 minutes and resting and is building up from there.  Reviewed home exercise with pt today.  Pt plans to walk and look into [facility] for exercise.  Reviewed THR, pulse, RPE, sign and symptoms, NTG use, and when to call 911 or MD.  Also discussed weather considerations and indoor options.  Pt voiced understanding.  Carlos Barrera attends consistently and works in correct RPE range.  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  This EP discussed a PT consult for his shoulder as he has noticed it hurts other times at home.  He can do 30 min on NS and Bio without rest.  Carlos Barrera has been doing well in rehab.  He is up to level 3 on the NuStep!  We will continue to monitor his progress.   Expected Outcomes  Short: Use RPE daily to regulate intensity. Long: Follow program prescription in THR.  Short - complete 15 min without resting Long - complete 30 min without rest  -  Short - continue to attend consistently Long - increase overal MET level  Short: Increase workload on BioStep.  Long: Continue to improve stamina.   [place] Name 01/31/19 1132 02/02/19 1053 02/14/19 1401 03/01/19 0954 03/02/19 1033     Exercise Goal Re-Evaluation   Exercise Goals Review  Increase Physical Activity;Increase Strength and Stamina;Understanding of Exercise Prescription  Increase Strength and Stamina;Increase Physical Activity  Increase Physical Activity;Increase Strength and Stamina;Able to understand and use rate of perceived exertion (RPE) scale;Able to understand and use Dyspnea scale;Knowledge and understanding of Target Heart Rate Range (THRR);Able to check pulse independently;Understanding of Exercise Prescription  Increase Physical Activity;Increase  Strength and Stamina;Understanding of Exercise Prescription  Increase Physical Activity;Increase Strength and Stamina;Understanding of Exercise Prescription   Comments  Carlos Barrera continues to do well in rehab.  He should be ready to increase workloads again.  When last here, he was having a few bad breathing days.  We will continue to monitor his progress.  Patient states that he cannot do more exercise than what he is doing here. Informed him that we will talk about home exercise after he talkes to his doctor about his medications.  Carlos Barrera has stated he does better with the arm work by just doing ROM due to shoulder issues.  He is able to do 30 min on T4 without stopping  Carlos Barrera continues to do okay in rehab. He is having issues with SOB which limits his abilty to progress.  We will continue to monitor his progress.  Carlos Barrera is doing arm exercises at home on his days off. He is choosing to do arm exercises bc he is unstable on his feet. Carlos Barrera lives alone. This EP encouraged that doing fly exercises and pulling exercises will help him to strengthen 'breathing' muscles and demonstrated some of those for him to use at home.Participant verbalized understanding.   Expected Outcomes  Short: Increase workloads.  Long: Continue to improve stamina.  Short: speak with physician about respiratory medications. Long: create a home exercise plan for patient.  Short - continue to attend consistently Long - increase overal stamina  Short: Move up BioStep.  Long: Continue to improve stamina.  Short: Add fly and pulling exercises to home routine. Long: Continue to add home exercise as strength improves as he can handle more.  Discharge Exercise Prescription (Final Exercise Prescription Changes): Exercise Prescription Changes - 03/01/19 1000      Response to Exercise   Blood Pressure (Admit)  122/60    Blood Pressure (Exercise)  134/54    Blood Pressure (Exit)  108/58    Heart Rate (Admit)  95 bpm    Heart Rate (Exercise)  96 bpm     Heart Rate (Exit)  90 bpm    Oxygen Saturation (Admit)  96 %    Oxygen Saturation (Exercise)  96 %    Oxygen Saturation (Exit)  97 %    Rating of Perceived Exertion (Exercise)  13    Perceived Dyspnea (Exercise)  3    Symptoms  SOB    Duration  Continue with 30 min of aerobic exercise without signs/symptoms of physical distress.    Intensity  THRR unchanged      Progression   Progression  Continue to progress workloads to maintain intensity without signs/symptoms of physical distress.    Average METs  2.35      Resistance Training   Training Prescription  Yes    Weight  3 lbs    Reps  10-15      Interval Training   Interval Training  No      NuStep   Level  3    Minutes  15    METs  2.7      Biostep-RELP   Level  1    Minutes  15    METs  2      Home Exercise Plan   Plans to continue exercise at  Home (comment)   walking   Frequency  Add 2 additional days to program exercise sessions.    Initial Home Exercises Provided  12/27/18       Nutrition:  Target Goals: Understanding of nutrition guidelines, daily intake of sodium <1582m, cholesterol <2032m calories 30% from fat and 7% or less from saturated fats, daily to have 5 or more servings of fruits and vegetables.  Biometrics: Pre Biometrics - 12/07/18 1310      Pre Biometrics   Height  5' 7.75" (1.721 m)    Weight  178 lb 12.8 oz (81.1 kg)    BMI (Calculated)  27.38        Nutrition Therapy Plan and Nutrition Goals: Nutrition Therapy & Goals - 12/07/18 1236      Nutrition Therapy   Diet  low Na, HH diet    Protein (specify units)  65-70g    Fiber  30 grams    Whole Grain Foods  3 servings    Saturated Fats  12 max. grams    Fruits and Vegetables  5 servings/day    Sodium  1.5 grams      Personal Nutrition Goals   Nutrition Goal  ST: add protein foods to B (when he has it) and protein to snack after lunch LT: lose 5 lbs and limit SOB    Comments  Pt reports eating breakfast sometimes (OJ, muffin,  sometimes yogurt). Takes out [employer] lunch with veggies and salad and meat - sometimes potatoes. Pt will have small snack of fruit with whipped cream. Discussed maybe adding peanut butter or yogurt to add protein. Disucssed HH, low Na eating and higher needs. Pt reports having a balanced relationship with food.      Intervention Plan   Intervention  Prescribe, educate and counsel regarding individualized specific dietary modifications aiming towards targeted core components such as weight,  hypertension, lipid management, diabetes, heart failure and other comorbidities.;Nutrition handout(s) given to patient.    Expected Outcomes  Short Term Goal: Understand basic principles of dietary content, such as calories, fat, sodium, cholesterol and nutrients.;Short Term Goal: A plan has been developed with personal nutrition goals set during dietitian appointment.;Long Term Goal: Adherence to prescribed nutrition plan.       Nutrition Assessments:   Nutrition Goals Re-Evaluation: Nutrition Goals Re-Evaluation    Row Name 01/10/19 1059 02/21/19 1139           Goals   Nutrition Goal  ST: add protein foods to B (when he has it) and protein to snack after lunch LT: lose 5 lbs and limit SOB  ST: add protein foods to B (when he has it) and protein to snack after lunch LT: lose 5 lbs and limit SOB      Comment  Pt reports feeling like walking is hard with pain and strength - mentioned how protein can help him build muslce so that he can increase his strength and mobility. Pt now consistently having breakfast " grain mix".  Continue with current changes      Expected Outcome  ST: add protein foods to B (when he has it) and protein to snack after lunch LT: lose 5 lbs and limit SOB  ST: add protein foods to B (when he has it) and protein to snack after lunch LT: lose 5 lbs and limit SOB         Nutrition Goals Discharge (Final Nutrition Goals Re-Evaluation): Nutrition Goals Re-Evaluation -  02/21/19 1139      Goals   Nutrition Goal  ST: add protein foods to B (when he has it) and protein to snack after lunch LT: lose 5 lbs and limit SOB    Comment  Continue with current changes    Expected Outcome  ST: add protein foods to B (when he has it) and protein to snack after lunch LT: lose 5 lbs and limit SOB       Psychosocial: Target Goals: Acknowledge presence or absence of significant depression and/or stress, maximize coping skills, provide positive support system. Participant is able to verbalize types and ability to use techniques and skills needed for reducing stress and depression.   Initial Review & Psychosocial Screening: Initial Psych Review & Screening - 12/06/18 1117      Initial Review   Current issues with  Current Stress Concerns;Current Anxiety/Panic    Source of Stress Concerns  Poor Coping Skills;Chronic Illness    Comments  PTSD on Zoloft, not currently seeing counselor, does not like being approached from behind, unexpected noises      [place]?  Yes    Comments  Church family, good close neighbors, son lives nearby      Barriers   Psychosocial barriers to participate in program  The patient should benefit from training in stress management and relaxation.;Psychosocial barriers identified (see note)      Screening Interventions   Interventions  Encouraged to exercise;Program counselor consult;To provide support and resources with identified psychosocial needs;Provide feedback about the scores to participant    Expected Outcomes  Short Term goal: Utilizing psychosocial counselor, staff and physician to assist with identification of specific Stressors or current issues interfering with healing process. Setting desired goal for each stressor or current issue identified.;Long Term Goal: Stressors or current issues are controlled or eliminated.;Short Term goal: Identification and review with participant of any Quality of  Life or  Depression concerns found by scoring the questionnaire.;Long Term goal: The participant improves quality of Life and PHQ9 Scores as seen by post scores and/or verbalization of changes       Quality of Life Scores:  Scores of 19 and below usually indicate a poorer quality of life in these areas.  A difference of  2-3 points is a clinically meaningful difference.  A difference of 2-3 points in the total score of the Quality of Life Index has been associated with significant improvement in overall quality of life, self-image, physical symptoms, and general health in studies assessing change in quality of life.  PHQ-9: Recent Review Flowsheet Data    Depression screen [facility] 2/9 01/31/2019 01/03/2019 12/07/2018   Decreased Interest 1 0 0   Down, Depressed, Hopeless 0 0 0   PHQ - 2 Score 1 0 0   Altered sleeping '3 2 3   ' Tired, decreased energy '1 1 2   ' Change in appetite - 0 1   Feeling bad or failure about yourself  0 0 0   Trouble concentrating 0 2 0   Moving slowly or fidgety/restless 0 0 3   Suicidal thoughts 3 0 0   PHQ-9 Score '8 5 9   ' Difficult doing work/chores Somewhat difficult Not difficult at all Somewhat difficult     Interpretation of Total Score  Total Score Depression Severity:  1-4 = Minimal depression, 5-9 = Mild depression, 10-14 = Moderate depression, 15-19 = Moderately severe depression, 20-27 = Severe depression   Psychosocial Evaluation and Intervention:   Psychosocial Re-Evaluation: Psychosocial Re-Evaluation    Row Name 01/03/19 1116 01/12/19 1040 02/02/19 1035 03/02/19 1101       Psychosocial Re-Evaluation   Current issues with  -  Current Stress Concerns;Current Anxiety/Panic  History of Depression;Current Stress Concerns;Current Anxiety/Panic  History of Depression;Current Stress Concerns;Current Anxiety/Panic    Comments  PHQ 9 repeated today  Score was 5  He states that his PTSD is under controll. Exercise is helping him somewhat to keep his stress down. He  has a positive attitude in class and is willing to stick it out until the end.  Reviewed patient health questionnaire (PHQ-9) with patient for follow up. Previously, patients score indicated signs/symptoms of depression. Reviewed to see if patient is improving symptom wise while in program. Score declined and patient states that it is because he has been more short of breath lately.  Being up is very stressful, feels like an invalid. Carlos Barrera is being very proactive in his healthcare and continue asking the [place] for answers. Praised him for doing the right thing to find asnwers. Feels very bothered by the fact that he could do anything he wanted at the beginning of the year and now can't do anything. He has friends, pastor, etc. that he can reach out to if he needs help. He takes meds for PTSD and feels that he good about that medication at this time.    Expected Outcomes  -  Short:continue to attend [facility]. Long: maintain exercise to keep stress at a minimum.  Short: Continue to work toward an improvement in [place] scores by attending [facility] regularly. Long: Continue to improve stress and depression coping skills by talking with staff and attending [facility] regularly and work toward a positive mental state.  Short: Continue to work on getting health questions and concerns answered. Long: Continue to get out of the house and exercise as a stress relief and visit with friends virtually if  possible.    Interventions  -  Encouraged to attend Pulmonary Rehabilitation for the exercise  Encouraged to attend Pulmonary Rehabilitation for the exercise  -    Continue Psychosocial Services   -  Follow up required by staff  Follow up required by staff  -       Psychosocial Discharge (Final Psychosocial Re-Evaluation): Psychosocial Re-Evaluation - 03/02/19 1101      Psychosocial Re-Evaluation   Current issues with  History of Depression;Current Stress Concerns;Current Anxiety/Panic    Comments  Being up is very  stressful, feels like an invalid. Carlos Barrera is being very proactive in his healthcare and continue asking the [place] for answers. Praised him for doing the right thing to find asnwers. Feels very bothered by the fact that he could do anything he wanted at the beginning of the year and now can't do anything. He has friends, pastor, etc. that he can reach out to if he needs help. He takes meds for PTSD and feels that he good about that medication at this time.    Expected Outcomes  Short: Continue to work on getting health questions and concerns answered. Long: Continue to get out of the house and exercise as a stress relief and visit with friends virtually if possible.       Education: Education Goals: Education classes will be provided on a weekly basis, covering required topics. Participant will state understanding/return demonstration of topics presented.  Learning Barriers/Preferences: Learning Barriers/Preferences - 12/06/18 1115      Learning Barriers/Preferences   Learning Barriers  Sight;Hearing;Exercise Concerns   wears glasses and hearing aid, wears knee brace   Learning Preferences  Individual Instruction;Written Material       Education Topics:  Initial Evaluation Education: - Verbal, written and demonstration of respiratory meds, oximetry and breathing techniques. Instruction on use of nebulizers and MDIs and importance of monitoring MDI activations.   General Nutrition Guidelines/Fats and Fiber: -Group instruction provided by verbal, written material, models and posters to present the general guidelines for heart healthy nutrition. Gives an explanation and review of dietary fats and fiber.   Controlling Sodium/Reading Food Labels: -Group verbal and written material supporting the discussion of sodium use in heart healthy nutrition. Review and explanation with models, verbal and written materials for utilization of the food label.   Exercise Physiology & General Exercise  Guidelines: - Group verbal and written instruction with models to review the exercise physiology of the cardiovascular system and associated critical values. Provides general exercise guidelines with specific guidelines to those with heart or lung disease.    Aerobic Exercise & Resistance Training: - Gives group verbal and written instruction on the various components of exercise. Focuses on aerobic and resistive training programs and the benefits of this training and how to safely progress through these programs.   Pulmonary Rehab from 02/23/2019 in [facility] Cardiac and Pulmonary Rehab  Date  02/23/19  Educator  jh  Instruction Review Code  1- Verbalizes Understanding      Flexibility, Balance, Mind/Body Relaxation: Provides group verbal/written instruction on the benefits of flexibility and balance training, including mind/body exercise modes such as yoga, pilates and tai chi.  Demonstration and skill practice provided.   Stress and Anxiety: - Provides group verbal and written instruction about the health risks of elevated stress and causes of high stress.  Discuss the correlation between heart/lung disease and anxiety and treatment options. Review healthy ways to manage with stress and anxiety.   Depression: - Provides group verbal  and written instruction on the correlation between heart/lung disease and depressed mood, treatment options, and the stigmas associated with seeking treatment.   Exercise & Equipment Safety: - Individual verbal instruction and demonstration of equipment use and safety with use of the equipment.   Pulmonary Rehab from 12/07/2018 in [facility] Cardiac and Pulmonary Rehab  Date  12/07/18  Educator  AS  Instruction Review Code  1- Verbalizes Understanding      Infection Prevention: - Provides verbal and written material to individual with discussion of infection control including proper hand washing and proper equipment cleaning during exercise session.   Pulmonary  Rehab from 12/07/2018 in [facility] Cardiac and Pulmonary Rehab  Date  12/07/18  Educator  AS  Instruction Review Code  1- Verbalizes Understanding      Falls Prevention: - Provides verbal and written material to individual with discussion of falls prevention and safety.   Pulmonary Rehab from 12/07/2018 in [facility] Cardiac and Pulmonary Rehab  Date  12/07/18  Educator  AS  Instruction Review Code  1- Verbalizes Understanding      Diabetes: - Individual verbal and written instruction to review signs/symptoms of diabetes, desired ranges of glucose level fasting, after meals and with exercise. Advice that pre and post exercise glucose checks will be done for 3 sessions at entry of program.   Chronic Lung Diseases: - Group verbal and written instruction to review updates, respiratory medications, advancements in procedures and treatments. Discuss use of supplemental oxygen including available portable oxygen systems, continuous and intermittent flow rates, concentrators, personal use and safety guidelines. Review proper use of inhaler and spacers. Provide informative websites for self-education.    Energy Conservation: - Provide group verbal and written instruction for methods to conserve energy, plan and organize activities. Instruct on pacing techniques, use of adaptive equipment and posture/positioning to relieve shortness of breath.   Triggers and Exacerbations: - Group verbal and written instruction to review types of environmental triggers and ways to prevent exacerbations. Discuss weather changes, air quality and the benefits of nasal washing. Review warning signs and symptoms to help prevent infections. Discuss techniques for effective airway clearance, coughing, and vibrations.   AED/CPR: - Group verbal and written instruction with the use of models to demonstrate the basic use of the AED with the basic ABC's of resuscitation.   Anatomy and Physiology of the Lungs: - Group verbal and  written instruction with the use of models to provide basic lung anatomy and physiology related to function, structure and complications of lung disease.   Anatomy & Physiology of the Heart: - Group verbal and written instruction and models provide basic cardiac anatomy and physiology, with the coronary electrical and arterial systems. Review of Valvular disease and Heart Failure   Cardiac Medications: - Group verbal and written instruction to review commonly prescribed medications for heart disease. Reviews the medication, class of the drug, and side effects.   Know Your Numbers and Risk Factors: -Group verbal and written instruction about important numbers in your health.  Discussion of what are risk factors and how they play a role in the disease process.  Review of Cholesterol, Blood Pressure, Diabetes, and BMI and the role they play in your overall health.   Pulmonary Rehab from 02/09/2019 in [facility] Cardiac and Pulmonary Rehab  Date  02/09/19  Educator  [facility]  Instruction Review Code  1- Verbalizes Understanding      Sleep Hygiene: -Provides group verbal and written instruction about how sleep can affect your health.  Define  sleep hygiene, discuss sleep cycles and impact of sleep habits. Review good sleep hygiene tips.    Other: -Provides group and verbal instruction on various topics (see comments)    Knowledge Questionnaire Score: Knowledge Questionnaire Score - 12/07/18 1322      Knowledge Questionnaire Score   Pre Score  14/18        Core Components/Risk Factors/Patient Goals at Admission: Personal Goals and Risk Factors at Admission - 12/07/18 1311      Core Components/Risk Factors/Patient Goals on Admission    Weight Management  Yes;Weight Maintenance    Intervention  Weight Management: Develop a combined nutrition and exercise program designed to reach desired caloric intake, while maintaining appropriate intake of nutrient and fiber, sodium and fats, and appropriate  energy expenditure required for the weight goal.;Weight Management: Provide education and appropriate resources to help participant work on and attain dietary goals.    Admit Weight  178 lb 12.8 oz (81.1 kg)    Goal Weight: Short Term  168 lb (76.2 kg)    Goal Weight: Long Term  158 lb (71.7 kg)    Expected Outcomes  Short Term: Continue to assess and modify interventions until short term weight is achieved;Long Term: Adherence to nutrition and physical activity/exercise program aimed toward attainment of established weight goal    Intervention  Provide education on lifestyle modifcations including regular physical activity/exercise, weight management, moderate sodium restriction and increased consumption of fresh fruit, vegetables, and low fat dairy, alcohol moderation, and smoking cessation.;Monitor prescription use compliance.    Expected Outcomes  Short Term: Continued assessment and intervention until BP is < 140/37m HG in hypertensive participants. < 130/843mHG in hypertensive participants with diabetes, heart failure or chronic kidney disease.;Long Term: Maintenance of blood pressure at goal levels.    Intervention  Provide education and support for participant on nutrition & aerobic/resistive exercise along with prescribed medications to achieve LDL <7059mHDL >85m2m  Expected Outcomes  Short Term: Participant states understanding of desired cholesterol values and is compliant with medications prescribed. Participant is following exercise prescription and nutrition guidelines.;Long Term: Cholesterol controlled with medications as prescribed, with individualized exercise RX and with personalized nutrition plan. Value goals: LDL < 70mg47mL > 40 mg.       Core Components/Risk Factors/Patient Goals Review:  Goals and Risk Factor Review    Row Name 01/12/19 1043 02/02/19 1048 02/07/19 1036 03/02/19 1108       Core Components/Risk Factors/Patient Goals Review   Personal Goals Review  Weight  Management/Obesity;Hypertension;Lipids  Weight Management/Obesity;Lipids;Improve shortness of breath with ADL's;Hypertension  Improve shortness of breath with ADL's;Weight Management/Obesity;Hypertension;Lipids  Improve shortness of breath with ADL's;Weight Management/Obesity;Hypertension;Lipids    Review  Patient wasnt to lose a few pounds. He wants to lose around 10 pounds. His goal is to reach 170 pounds. He does not check his blood pressure at home and he has a machine. informed him to check his blood pressure at home when he wakes up. He does noit have a pulse oximeter and was informed to get one. He states he can get one through his insurance.  Patient states that he is getting more short of breath and is seeing his doctor to figure out if he can get respiratory medications. He has been having more trouble with his shortness of breath lately and has not been able to do the things that he wants to. He was going to go to church and could not shower or go due to him  being short of breath.  Carlos Barrera sees his pulmonologist tomorrow - he is taking meds as directed but still gets very short of breath.  he has done well with exercise and has seen imporvement there.  Carlos Barrera is taking his medications as prescribed. He is happy with his current weight but does not want to gain any. Carlos Barrera wants to ask Dr if he can take less meds for BP as he thinks that may contribute to some of his fatigue.    Expected Outcomes  Short: check blood pressure at home daily. Obtain a pulse oximeter. Long: maintain blood pressure and oxygen reading at home independently.  Short: Attend [facility] regularly to improve shortness of breath with ADL's. Long: maintain independence with ADL's  Short - see Dr tomorrow  Long - improve shortness of breath with ADLs  Short: take meds as presscribed and go to Drs appts Long- Improve energy level and shortness of breath.       Core Components/Risk Factors/Patient Goals at Discharge (Final Review):  Goals and  Risk Factor Review - 03/02/19 1108      Core Components/Risk Factors/Patient Goals Review   Personal Goals Review  Improve shortness of breath with ADL's;Weight Management/Obesity;Hypertension;Lipids    Review  Carlos Barrera is taking his medications as prescribed. He is happy with his current weight but does not want to gain any. Carlos Barrera wants to ask Dr if he can take less meds for BP as he thinks that may contribute to some of his fatigue.    Expected Outcomes  Short: take meds as presscribed and go to Drs appts Long- Improve energy level and shortness of breath.       ITP Comments: ITP Comments    Row Name 12/06/18 1134 12/07/18 1259 12/28/18 0611 01/10/19 1316 01/25/19 1335   ITP Comments  Virtual Orientation completed.  Documentation can be found in [place] paperwork linked to encounter in media section.  Original referall was faxed on 11/16/18.  Completed initial 6MWT and nutrition eval.  Initial ITP created and sent to review to Dr Sabra Heck  30 Day review. Continue with ITP unless directed changes per Medical Director review.  Patient states that he was not sure about an event that happened at home. He states that he had a muscle soreness or burn in his chest. He states that it was not painful, or heartburn but something he realized.Informed him to make note if it happens again write down what he was doing before hand.  30 day review completed. ITP sent to Dr. Emily Filbert, Medical Director of Cardiac and Pulmonary Rehab. Continue with ITP unless changes are made by physician.  Department closed starting 10/2 until further notice by infection prevention and Health at Work teams for [place].   [place] Name 02/22/19 0634 03/02/19 1113 03/21/19 1332 03/22/19 1011     ITP Comments  30 day review completed. Continue with ITP sent to Dr. Emily Filbert, Medical Director of Cardiac and Pulmonary Rehab for review , changes as needed and signature.  Patient having knee replacement next Friday and expects to be back as soon as  possible after a quick recovery. Discussed patient needs of a shower chair, grab rails, someone to take and drive him home, to stay with him after surgery, to prepare meals, and help him with medications.  Called to check on pt.  Had TKR on 03/09/19 and sent SNF.  Left message.  30 day review competed . ITP sent to Dr Emily Filbert for review, changes as needed  and ITP approval signature.       Comments:

## 2019-03-23 ENCOUNTER — Ambulatory Visit: Payer: No Typology Code available for payment source

## 2019-03-23 ENCOUNTER — Other Ambulatory Visit: Payer: Self-pay

## 2019-03-23 ENCOUNTER — Encounter: Payer: Self-pay | Admitting: Orthopedic Surgery

## 2019-03-23 ENCOUNTER — Ambulatory Visit (INDEPENDENT_AMBULATORY_CARE_PROVIDER_SITE_OTHER): Payer: Medicare Other | Admitting: Orthopedic Surgery

## 2019-03-23 ENCOUNTER — Ambulatory Visit (INDEPENDENT_AMBULATORY_CARE_PROVIDER_SITE_OTHER): Payer: Medicare Other

## 2019-03-23 DIAGNOSIS — Z96652 Presence of left artificial knee joint: Secondary | ICD-10-CM

## 2019-03-25 ENCOUNTER — Encounter: Payer: Self-pay | Admitting: Orthopedic Surgery

## 2019-03-25 NOTE — Progress Notes (Signed)
   Post-Op Visit Note   Patient: Carlos Barrera           Date of Birth: 12-Mar-1941           MRN: [id number] Visit Date: 03/23/2019 PCP: Clinic, [place]:  Chief Complaint:  Chief Complaint  Patient presents with  . Left Knee - Routine Post Op   Visit Diagnoses:  1. Status post total left knee replacement     Plan: Carlos Barrera is a patient who is now 2 weeks out left total knee replacement.  Not taking any pain medicine.  He is at home.  Start CPM machine at home.  Is doing well with his knee but is having some pulmonary issues.  He does have a history of COPD.  Start him in physical therapy tomorrow.  On exam the incision is intact.  Radiographs look good.  Range of motion full extension to about 100 degrees of flexion at this time.  Extensor mechanism is intact and swelling is minimal.  Negative Homans and no calf tenderness on exam today.  Plan is the patient is doing quite well with his total knee replacement.  Come back in 4 weeks for clinical recheck.  Continue with home health physical therapy for quad strengthening and range of motion.  Follow-Up Instructions: Return in about 4 weeks (around 04/20/2019).   Orders:  Orders Placed This Encounter  Procedures  . XR Knee 1-2 Views Left   No orders of the defined types were placed in this encounter.   Imaging: No results found.  PMFS History: Patient Active Problem List   Diagnosis Date Noted  . Arthritis of left knee 03/09/2019   Past Medical History:  Diagnosis Date  . Asthma    exercise induced asthma   . COPD (chronic obstructive pulmonary disease) ([place])   . GERD (gastroesophageal reflux disease)   . Hearing loss    wears hearing aids  . Hyperlipidemia   . Hypertension   . Hypothyroidism   . Insomnia   . Prostate cancer ([place])   . PTSD (post-traumatic stress disorder)   . Seasonal allergies   . Thyroid disease     History reviewed. No pertinent family history.  Past Surgical  History:  Procedure Laterality Date  . BACK SURGERY     x 2  . COLONOSCOPY     hx polyps  . HERNIA REPAIR    . INCISE AND DRAIN ABCESS     patient denies this procedure 03/03/19  . NECK SURGERY     x 1  . TONSILLECTOMY    . 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 TOTAL KNEE ARTHROPLASTY Left 03/09/2019   Procedure: LEFT TOTAL KNEE ARTHROPLASTY;  Surgeon: Meredith Pel, MD;  Location: [facility];  Service: Orthopedics;  Laterality: Left;  . WISDOM TOOTH EXTRACTION     Social History   Occupational History  . Not on file  Tobacco Use  . Smoking status: Former Smoker    Packs/day: 2.00    Years: 20.00    Pack years: 40.00    Types: Cigarettes    Quit date: 04/20/1980    Years since quitting: 38.9  . Smokeless tobacco: Never Used  Substance and Sexual Activity  . Alcohol use: No  . Drug use: No  . Sexual activity: Yes    Birth control/protection: None

## 2019-03-28 ENCOUNTER — Telehealth: Payer: Self-pay | Admitting: Orthopedic Surgery

## 2019-03-28 ENCOUNTER — Ambulatory Visit: Payer: No Typology Code available for payment source

## 2019-03-28 NOTE — Telephone Encounter (Signed)
Deneise Lever, PT, from [facility] left a voicemail message stating that she saw the patient on Saturday for his [facility] PT evaluation.  She is requesting VO for [facility] PT for 2x a week for 4 weeks.  She is also adding a HH aid to assist with bathing for at least 4 weeks.  CB#[phone].  Thank you.

## 2019-03-28 NOTE — Telephone Encounter (Signed)
IC verbal given.  

## 2019-03-29 ENCOUNTER — Encounter: Payer: Self-pay | Admitting: *Deleted

## 2019-03-29 DIAGNOSIS — R0600 Dyspnea, unspecified: Secondary | ICD-10-CM

## 2019-03-30 ENCOUNTER — Ambulatory Visit: Payer: No Typology Code available for payment source

## 2019-03-30 ENCOUNTER — Telehealth: Payer: Self-pay | Admitting: Orthopedic Surgery

## 2019-03-30 NOTE — Telephone Encounter (Signed)
Carlos Barrera from [facility] called and stated patient will no longer need additional visit for OT. Mrs. Lavone Neri phone number is [phone]. Patient phone number is [phone].

## 2019-03-30 NOTE — Telephone Encounter (Signed)
FYI

## 2019-04-07 ENCOUNTER — Telehealth: Payer: Self-pay | Admitting: Orthopedic Surgery

## 2019-04-07 NOTE — Telephone Encounter (Signed)
Dolores Lory from [facility] called stating that they got the order back but it looks like it was stamped with the [place], which they are not allowed to do.  She stated that he does not have to sign all the pages, ut if he would personally sign one of the pages, they are fine with that.  Her CB#[phone].  Thank you.

## 2019-04-07 NOTE — Telephone Encounter (Signed)
IC she will refax and I will have Dr Marlou Sa to sign rather than stamp.

## 2019-04-10 ENCOUNTER — Telehealth: Payer: Self-pay

## 2019-04-10 ENCOUNTER — Other Ambulatory Visit: Payer: Self-pay | Admitting: Internal Medicine

## 2019-04-10 ENCOUNTER — Telehealth: Payer: Self-pay | Admitting: Orthopedic Surgery

## 2019-04-10 DIAGNOSIS — D0220 Carcinoma in situ of unspecified bronchus and lung: Secondary | ICD-10-CM

## 2019-04-10 NOTE — Telephone Encounter (Signed)
Carlos Barrera found out he has lung cancer.  He is doing well as far as his knee replacement.  He sees the oncologist this week and will let us know what the plan is from here.

## 2019-04-10 NOTE — Telephone Encounter (Signed)
Caryl Pina from [facility] called requesting cancellation on Physical Therapy for home health. Caryl Pina from [facility] phone number is [phone].

## 2019-04-11 ENCOUNTER — Telehealth: Payer: Self-pay

## 2019-04-11 NOTE — Telephone Encounter (Signed)
Carlos Barrera, PT with [facility] would like a verbal order to hold off on visits this week, per the patient's request and resume visits starting on 04/23/2019.  Cb# [phone].  Please advise.  Thank you.

## 2019-04-11 NOTE — Telephone Encounter (Signed)
IC s/w Caryl Pina she said that patient refused home health aid to come to his home assist him and they were discharging him at this time.

## 2019-04-11 NOTE — Telephone Encounter (Signed)
Tried calling. No answer. LMVM with orders.

## 2019-04-15 ENCOUNTER — Emergency Department (HOSPITAL_COMMUNITY): Payer: Medicare Other

## 2019-04-15 ENCOUNTER — Inpatient Hospital Stay (HOSPITAL_COMMUNITY)
Admission: EM | Admit: 2019-04-15 | Discharge: 2019-04-21 | DRG: 309 | Disposition: A | Discharge status: Home / Self Care | Payer: Medicare Other | Attending: Internal Medicine | Admitting: Internal Medicine

## 2019-04-15 ENCOUNTER — Other Ambulatory Visit: Payer: Self-pay

## 2019-04-15 DIAGNOSIS — I480 Paroxysmal atrial fibrillation: Principal | ICD-10-CM | POA: Diagnosis present

## 2019-04-15 DIAGNOSIS — R918 Other nonspecific abnormal finding of lung field: Secondary | ICD-10-CM

## 2019-04-15 DIAGNOSIS — Z79899 Other long term (current) drug therapy: Secondary | ICD-10-CM

## 2019-04-15 DIAGNOSIS — C61 Malignant neoplasm of prostate: Secondary | ICD-10-CM

## 2019-04-15 DIAGNOSIS — I4891 Unspecified atrial fibrillation: Secondary | ICD-10-CM | POA: Diagnosis not present

## 2019-04-15 DIAGNOSIS — D630 Anemia in neoplastic disease: Secondary | ICD-10-CM | POA: Diagnosis present

## 2019-04-15 DIAGNOSIS — C3491 Malignant neoplasm of unspecified part of right bronchus or lung: Secondary | ICD-10-CM | POA: Diagnosis present

## 2019-04-15 DIAGNOSIS — J449 Chronic obstructive pulmonary disease, unspecified: Secondary | ICD-10-CM | POA: Diagnosis present

## 2019-04-15 DIAGNOSIS — Z803 Family history of malignant neoplasm of breast: Secondary | ICD-10-CM

## 2019-04-15 DIAGNOSIS — I10 Essential (primary) hypertension: Secondary | ICD-10-CM | POA: Diagnosis present

## 2019-04-15 DIAGNOSIS — Z888 Allergy status to other drugs, medicaments and biological substances status: Secondary | ICD-10-CM

## 2019-04-15 DIAGNOSIS — E785 Hyperlipidemia, unspecified: Secondary | ICD-10-CM | POA: Diagnosis present

## 2019-04-15 DIAGNOSIS — C3490 Malignant neoplasm of unspecified part of unspecified bronchus or lung: Secondary | ICD-10-CM | POA: Diagnosis present

## 2019-04-15 DIAGNOSIS — Z20822 Contact with and (suspected) exposure to covid-19: Secondary | ICD-10-CM | POA: Diagnosis present

## 2019-04-15 DIAGNOSIS — Z7982 Long term (current) use of aspirin: Secondary | ICD-10-CM

## 2019-04-15 DIAGNOSIS — I313 Pericardial effusion (noninflammatory): Secondary | ICD-10-CM | POA: Diagnosis present

## 2019-04-15 DIAGNOSIS — F4310 Post-traumatic stress disorder, unspecified: Secondary | ICD-10-CM | POA: Diagnosis present

## 2019-04-15 DIAGNOSIS — Z79890 Hormone replacement therapy (postmenopausal): Secondary | ICD-10-CM

## 2019-04-15 DIAGNOSIS — Z8546 Personal history of malignant neoplasm of prostate: Secondary | ICD-10-CM

## 2019-04-15 DIAGNOSIS — E039 Hypothyroidism, unspecified: Secondary | ICD-10-CM | POA: Diagnosis present

## 2019-04-15 DIAGNOSIS — I959 Hypotension, unspecified: Secondary | ICD-10-CM | POA: Diagnosis present

## 2019-04-15 DIAGNOSIS — I248 Other forms of acute ischemic heart disease: Secondary | ICD-10-CM | POA: Diagnosis present

## 2019-04-15 DIAGNOSIS — N179 Acute kidney failure, unspecified: Secondary | ICD-10-CM | POA: Diagnosis present

## 2019-04-15 DIAGNOSIS — Z87891 Personal history of nicotine dependence: Secondary | ICD-10-CM

## 2019-04-15 DIAGNOSIS — Z8249 Family history of ischemic heart disease and other diseases of the circulatory system: Secondary | ICD-10-CM

## 2019-04-15 DIAGNOSIS — Z96652 Presence of left artificial knee joint: Secondary | ICD-10-CM | POA: Diagnosis present

## 2019-04-15 DIAGNOSIS — K219 Gastro-esophageal reflux disease without esophagitis: Secondary | ICD-10-CM | POA: Diagnosis present

## 2019-04-15 HISTORY — DX: Malignant neoplasm of unspecified part of unspecified bronchus or lung: C34.90

## 2019-04-15 HISTORY — DX: Contact with and (suspected) exposure to other hazardous, chiefly nonmedicinal, chemicals: Z77.098

## 2019-04-15 HISTORY — DX: Unspecified osteoarthritis, unspecified site: M19.90

## 2019-04-15 HISTORY — DX: Other specified postprocedural states: Z98.890

## 2019-04-15 HISTORY — DX: Personal history of other medical treatment: Z92.89

## 2019-04-15 LAB — HEPATIC FUNCTION PANEL
ALT: 14 U/L (ref 0–44)
AST: 16 U/L (ref 15–41)
Albumin: 2.7 g/dL — ABNORMAL LOW (ref 3.5–5.0)
Alkaline Phosphatase: 87 U/L (ref 38–126)
Bilirubin, Direct: <0.1 mg/dL (ref 0.0–0.2)
Total Bilirubin: 0.4 mg/dL (ref 0.3–1.2)
Total Protein: 5.6 g/dL — ABNORMAL LOW (ref 6.5–8.1)

## 2019-04-15 LAB — URINALYSIS, ROUTINE W REFLEX MICROSCOPIC
Bacteria, UA: NONE SEEN
Bilirubin Urine: NEGATIVE
Glucose, UA: NEGATIVE mg/dL
Hgb urine dipstick: NEGATIVE
Ketones, ur: NEGATIVE mg/dL
Leukocytes,Ua: NEGATIVE
Nitrite: NEGATIVE
Protein, ur: 30 mg/dL — AB
Specific Gravity, Urine: 1.028 (ref 1.005–1.030)
pH: 5 (ref 5.0–8.0)

## 2019-04-15 LAB — CBC WITH DIFFERENTIAL/PLATELET
Abs Immature Granulocytes: 0.02 10*3/uL (ref 0.00–0.07)
Basophils Absolute: 0 10*3/uL (ref 0.0–0.1)
Basophils Relative: 0 %
Eosinophils Absolute: 0.1 10*3/uL (ref 0.0–0.5)
Eosinophils Relative: 2 %
HCT: 32.1 % — ABNORMAL LOW (ref 39.0–52.0)
Hemoglobin: 10.3 g/dL — ABNORMAL LOW (ref 13.0–17.0)
Immature Granulocytes: 0 %
Lymphocytes Relative: 10 %
Lymphs Abs: 0.7 10*3/uL (ref 0.7–4.0)
MCH: 30.4 pg (ref 26.0–34.0)
MCHC: 32.1 g/dL (ref 30.0–36.0)
MCV: 94.7 fL (ref 80.0–100.0)
Monocytes Absolute: 0.3 10*3/uL (ref 0.1–1.0)
Monocytes Relative: 5 %
Neutro Abs: 5.7 10*3/uL (ref 1.7–7.7)
Neutrophils Relative %: 83 %
Platelets: 210 10*3/uL (ref 150–400)
RBC: 3.39 MIL/uL — ABNORMAL LOW (ref 4.22–5.81)
RDW: 14.6 % (ref 11.5–15.5)
WBC: 6.8 10*3/uL (ref 4.0–10.5)
nRBC: 0 % (ref 0.0–0.2)

## 2019-04-15 LAB — TROPONIN I (HIGH SENSITIVITY): Troponin I (High Sensitivity): 90 ng/L — ABNORMAL HIGH (ref <18)

## 2019-04-15 LAB — BASIC METABOLIC PANEL
Anion gap: 7 (ref 5–15)
BUN: 15 mg/dL (ref 8–23)
CO2: 22 mmol/L (ref 22–32)
Calcium: 9.2 mg/dL (ref 8.9–10.3)
Chloride: 110 mmol/L (ref 98–111)
Creatinine, Ser: 1.15 mg/dL (ref 0.61–1.24)
GFR calc Af Amer: >60 mL/min (ref >60)
GFR calc non Af Amer: >60 mL/min (ref >60)
Glucose, Bld: 130 mg/dL — ABNORMAL HIGH (ref 70–99)
Potassium: 3.8 mmol/L (ref 3.5–5.1)
Sodium: 139 mmol/L (ref 135–145)

## 2019-04-15 LAB — I-STAT CHEM 8, ED
BUN: 16 mg/dL (ref 8–23)
Calcium, Ion: 1.3 mmol/L (ref 1.15–1.40)
Chloride: 106 mmol/L (ref 98–111)
Creatinine, Ser: 1.1 mg/dL (ref 0.61–1.24)
Glucose, Bld: 126 mg/dL — ABNORMAL HIGH (ref 70–99)
HCT: 29 % — ABNORMAL LOW (ref 39.0–52.0)
Hemoglobin: 9.9 g/dL — ABNORMAL LOW (ref 13.0–17.0)
Potassium: 3.8 mmol/L (ref 3.5–5.1)
Sodium: 138 mmol/L (ref 135–145)
TCO2: 25 mmol/L (ref 22–32)

## 2019-04-15 LAB — LACTIC ACID, PLASMA: Lactic Acid, Venous: 1.7 mmol/L (ref 0.5–1.9)

## 2019-04-15 LAB — POC OCCULT BLOOD, ED: Fecal Occult Bld: POSITIVE — AB

## 2019-04-15 LAB — LIPASE, BLOOD: Lipase: 40 U/L (ref 11–51)

## 2019-04-15 LAB — POC SARS CORONAVIRUS 2 AG -  ED: SARS Coronavirus 2 Ag: NEGATIVE

## 2019-04-15 LAB — D-DIMER, QUANTITATIVE: D-Dimer, Quant: 7.31 ug/mL-FEU — ABNORMAL HIGH (ref 0.00–0.50)

## 2019-04-15 MED ORDER — IOHEXOL 350 MG/ML SOLN
100.0000 mL | Freq: Once | INTRAVENOUS | Status: AC | PRN
  Administered 2019-04-15: 100 mL via INTRAVENOUS

## 2019-04-15 MED ORDER — SODIUM CHLORIDE 0.9 % IV BOLUS
1000.0000 mL | Freq: Once | INTRAVENOUS | Status: AC
  Administered 2019-04-15: 1000 mL via INTRAVENOUS

## 2019-04-15 MED ORDER — DILTIAZEM HCL-DEXTROSE 125-5 MG/125ML-% IV SOLN (PREMIX)
5.0000 mg/h | INTRAVENOUS | Status: DC
  Administered 2019-04-15 – 2019-04-16 (×2): 5 mg/h via INTRAVENOUS
  Filled 2019-04-15: qty 125

## 2019-04-15 NOTE — ED Triage Notes (Signed)
Pt arrives via EMS with chief complaint of SOB that started 1 hour ago. EMS reports pt was in AFIB RVR, had a short run of SVT of about 12 beats then returned to AFIB RVR. EMS administered 1 litter of fluids and 20 mg of Cardizem PTA. Pt continues to be in AFIB on arrival.

## 2019-04-15 NOTE — ED Provider Notes (Signed)
[place] EMERGENCY DEPARTMENT Provider Note   CSN: [id number] Arrival date & time: 04/15/19  2210     History Chief Complaint  Patient presents with  . Shortness of Breath    Carlos Barrera is a 78 y.o. male.  Patient presents to the emergency department with a chief complaint of shortness of breath.  He states that he has been having ongoing shortness of breath for quite some time.  He states he was recently diagnosed with lung cancer.  He has not started treatment yet.  He states that he began to feel worse over the past few days, and states that he was awakened from sleep tonight with more difficulty breathing.  He called EMS, and was brought to the hospital.  EMS found him to be in A. fib with RVR with rates in the 150s to 170s.  They gave him 1 L of normal saline and 20 mg bolus of Cardizem.  He denies any improvements with these treatments.  He denies any fevers, chills, productive cough.  Denies any other associated symptoms.  He is not anticoagulated.  The history is provided by the patient. No language interpreter was used.       Past Medical History:  Diagnosis Date  . Asthma    exercise induced asthma   . COPD (chronic obstructive pulmonary disease) ([place])   . GERD (gastroesophageal reflux disease)   . Hearing loss    wears hearing aids  . Hyperlipidemia   . Hypertension   . Hypothyroidism   . Insomnia   . Prostate cancer ([place])   . PTSD (post-traumatic stress disorder)   . Seasonal allergies   . Thyroid disease     Patient Active Problem List   Diagnosis Date Noted  . Arthritis of left knee 03/09/2019    Past Surgical History:  Procedure Laterality Date  . BACK SURGERY     x 2  . COLONOSCOPY     hx polyps  . HERNIA REPAIR    . INCISE AND DRAIN ABCESS     patient denies this procedure 03/03/19  . NECK SURGERY     x 1  . TONSILLECTOMY    . 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 TOTAL KNEE ARTHROPLASTY Left 03/09/2019   Procedure: LEFT TOTAL KNEE ARTHROPLASTY;  Surgeon: Meredith Pel, MD;  Location: [facility];  Service: Orthopedics;  Laterality: Left;  . WISDOM TOOTH EXTRACTION         No family history on file.  Social History   Tobacco Use  . Smoking status: Former Smoker    Packs/day: 2.00    Years: 20.00    Pack years: 40.00    Types: Cigarettes    Quit date: 04/20/1980    Years since quitting: 39.0  . Smokeless tobacco: Never Used  Substance Use Topics  . Alcohol use: No  . Drug use: No    Home Medications Prior to Admission medications   Medication Sig Start Date End Date Taking? Authorizing Provider  aspirin 81 MG chewable tablet Chew 1 tablet (81 mg total) by mouth daily. 03/14/19   Magnant, Gerrianne Scale, PA-C  Calcium Carbonate-Vitamin D 600-400 MG-UNIT tablet Take 1 tablet by mouth 2 (two) times daily.     [provider]  cetirizine (ZYRTEC) 10 MG tablet Take 10 mg by mouth daily.  02/13/18   [provider]  guaifenesin (HUMIBID E) 400 MG TABS tablet Take 400 mg by mouth 3 (three) times daily.  [provider]  levothyroxine (SYNTHROID) 125 MCG tablet Take 125 mcg by mouth daily before breakfast.    [provider]  lisinopril (PRINIVIL,ZESTRIL) 10 MG tablet Take 10 mg by mouth daily.    [provider]  Meloxicam 15 MG TBDP Take 15 mg by mouth daily as needed (pain.).     [provider]  methocarbamol (ROBAXIN) 500 MG tablet Take 1 tablet (500 mg total) by mouth every 8 (eight) hours as needed for muscle spasms. 03/14/19   Magnant, Charles L, PA-C  omeprazole (PRILOSEC) 20 MG capsule Take 20 mg by mouth 2 (two) times daily as needed (acid reflux/indigestion.).  10/17/18   [provider]  oxyCODONE (OXY IR/ROXICODONE) 5 MG immediate release tablet Take 1-2 tablets (5-10 mg total) by mouth every 4 (four) hours as needed for moderate pain (pain score 4-6). 03/14/19   Magnant, Charles L, PA-C  pravastatin (PRAVACHOL) 20 MG  tablet Take 20 mg by mouth at bedtime.    [provider]  prazosin (MINIPRESS) 5 MG capsule Take 10 mg by mouth at bedtime.  02/21/18   [provider]  REFRESH TEARS 0.5 % SOLN Place 1 drop into both eyes 2 (two) times daily.  02/13/18   [provider]  sertraline (ZOLOFT) 50 MG tablet Take 50 mg by mouth at bedtime.  12/14/18   [provider]  tamsulosin (FLOMAX) 0.4 MG CAPS capsule Take 0.4 mg by mouth daily.  01/06/19   [provider]  traZODone (DESYREL) 50 MG tablet Take 50 mg by mouth at bedtime.    [provider]    Allergies    Bee venom and Feldene [piroxicam]  Review of Systems   Review of Systems  All other systems reviewed and are negative.   Physical Exam Updated Vital Signs SpO2 98%   Physical Exam Vitals and nursing note reviewed.  Constitutional:      Appearance: He is well-developed.  HENT:     Head: Normocephalic and atraumatic.  Eyes:     Conjunctiva/sclera: Conjunctivae normal.  Cardiovascular:     Rate and Rhythm: Tachycardia present. Rhythm irregular.     Heart sounds: No murmur.  Pulmonary:     Effort: Pulmonary effort is normal. No respiratory distress.     Breath sounds: Normal breath sounds.  Abdominal:     Palpations: Abdomen is soft.     Tenderness: There is no abdominal tenderness.  Musculoskeletal:     Cervical back: Neck supple.  Skin:    General: Skin is warm and dry.  Neurological:     Mental Status: He is alert and oriented to person, place, and time.  Psychiatric:        Mood and Affect: Mood normal.        Behavior: Behavior normal.     ED Results / Procedures / Treatments   Labs (all labs ordered are listed, but only abnormal results are displayed) Labs Reviewed  CBC WITH DIFFERENTIAL/PLATELET - Abnormal; Notable for the following components:      Result Value   RBC 3.39 (*)    Hemoglobin 10.3 (*)    HCT 32.1 (*)    All other components within normal limits  BASIC  METABOLIC PANEL - Abnormal; Notable for the following components:   Glucose, Bld 130 (*)    All other components within normal limits  HEPATIC FUNCTION PANEL - Abnormal; Notable for the following components:   Total Protein 5.6 (*)    Albumin 2.7 (*)  All other components within normal limits  D-DIMER, QUANTITATIVE (NOT AT [facility]) - Abnormal; Notable for the following components:   D-Dimer, Quant 7.31 (*)    All other components within normal limits  URINALYSIS, ROUTINE W REFLEX MICROSCOPIC - Abnormal; Notable for the following components:   Protein, ur 30 (*)    All other components within normal limits  I-STAT CHEM 8, ED - Abnormal; Notable for the following components:   Glucose, Bld 126 (*)    Hemoglobin 9.9 (*)    HCT 29.0 (*)    All other components within normal limits  POC OCCULT BLOOD, ED - Abnormal; Notable for the following components:   Fecal Occult Bld POSITIVE (*)    All other components within normal limits  TROPONIN I (HIGH SENSITIVITY) - Abnormal; Notable for the following components:   Troponin I (High Sensitivity) 90 (*)    All other components within normal limits  TROPONIN I (HIGH SENSITIVITY) - Abnormal; Notable for the following components:   Troponin I (High Sensitivity) 111 (*)    All other components within normal limits  URINE CULTURE  CULTURE, BLOOD (ROUTINE X 2)  CULTURE, BLOOD (ROUTINE X 2)  RESPIRATORY PANEL BY RT PCR (FLU A&B, COVID)  LIPASE, BLOOD  LACTIC ACID, PLASMA  LACTIC ACID, PLASMA  APTT  PROTIME-INR  PROCALCITONIN  POC SARS CORONAVIRUS 2 AG -  ED  I-STAT CREATININE, ED    EKG EKG Interpretation  Date/Time:  Saturday April 15 2019 22:16:23 EST Ventricular Rate:  157 PR Interval:    QRS Duration: 81 QT Interval:  294 QTC Calculation: 476 R Axis:   80 Text Interpretation: Atrial fibrillation with rapid V-rate Repolarization abnormality, prob rate related Confirmed by Lennice Sites [phone]) on 04/15/2019 10:43:02  PM   Radiology CT Angio Chest PE W and/or Wo Contrast  Result Date: 04/16/2019 CLINICAL DATA:  Shortness of breath EXAM: CT ANGIOGRAPHY CHEST WITH CONTRAST TECHNIQUE: Multidetector CT imaging of the chest was performed using the standard protocol during bolus administration of intravenous contrast. Multiplanar CT image reconstructions and MIPs were obtained to evaluate the vascular anatomy. CONTRAST:  119mL OMNIPAQUE IOHEXOL 350 MG/ML SOLN COMPARISON:  None. FINDINGS: Cardiovascular: There is a optimal opacification of the pulmonary arteries. There is no central,segmental, or subsegmental filling defects within the pulmonary arteries. There is mild cardiomegaly. A small to moderate pericardial effusion is present. Mild scattered aortic atherosclerosis is seen. There is normal three-vessel brachiocephalic anatomy. Mediastinum/Nodes: Subcarinal adenopathy is seen measuring 2.2 cm in AP dimension. There is also a right hilar adenopathy which measures 1.5 cm in AP dimension. Thyroid gland, trachea, and esophagus demonstrate no significant findings. Lungs/Pleura: There is small peripheral patchy airspace opacity seen within the posterior right upper and lower lung. There is also tree-in-bud opacity seen in the periphery of the right lung base. There is a small right and trace left pleural effusion. Mildly increased interstitial thickening seen within both upper lungs. Upper Abdomen: No acute abnormalities present in the visualized portions of the upper abdomen. Musculoskeletal: No chest wall abnormality. No acute or significant osseous findings. Review of the MIP images confirms the above findings. IMPRESSION: No central, segmental, or subsegmental pulmonary embolism. Small to moderate pericardial effusion Subcarinal and right hilar adenopathy which could be reactive. Patchy/ground-glass opacity in the posterior right lung which could be due to atelectasis and/or infectious etiology. Small right and trace left  pleural effusion. Mildly increased interstitial thickening seen within both upper lungs likely due to mild interstitial edema. Aortic Atherosclerosis (  ICD10-I70.0). Electronically Signed   By: Prudencio Pair M.D.   On: 04/16/2019 00:04   DG Chest Portable 1 View  Result Date: 04/16/2019 CLINICAL DATA:  Shortness of breath EXAM: PORTABLE CHEST 1 VIEW COMPARISON:  None. FINDINGS: The heart size and mediastinal contours are within normal limits. There is mildly increased interstitial markings seen throughout both lungs. There is patchy airspace opacity at the periphery of the right lung base. The visualized skeletal structures are unremarkable. IMPRESSION: Mildly increased interstitial markings throughout both lungs with patchy airspace opacity in the right lung base. This could be due to asymmetric edema and/or infectious etiology. Electronically Signed   By: Prudencio Pair M.D.   On: 04/16/2019 00:39    Procedures .Critical Care Performed by: Montine Circle, PA-C Authorized by: Montine Circle, PA-C   Critical care provider statement:    Critical care time (minutes):  40   Critical care was necessary to treat or prevent imminent or life-threatening deterioration of the following conditions:  Circulatory failure   Critical care was time spent personally by me on the following activities:  Discussions with consultants, evaluation of patient's response to treatment, examination of patient, ordering and performing treatments and interventions, ordering and review of laboratory studies, ordering and review of radiographic studies, pulse oximetry, re-evaluation of patient's condition, obtaining history from patient or surrogate and review of old charts   (including critical care time)  Medications Ordered in ED Medications - No data to display  ED Course  I have reviewed the triage vital signs and the nursing notes.  Pertinent labs & imaging results that were available during my care of the patient  were reviewed by me and considered in my medical decision making (see chart for details).    MDM Rules/Calculators/A&P                      Discussed case with Dr. Ronnald Nian.  Has received 2L of NS.  Remains tachycardic and hypotensive to the 09F and 81W systolic.  Dr. Ronnald Nian recommends no additional fluids, lactate is normal.  We will continue to monitor.  Patient is afebrile.  He does have some opacities seen on CT, question infectious.  His hemoglobins have been stable over the past several months, but he is noted to be Hemoccult positive.  CT PE study ordered secondary to recent cancer diagnosis along with recent surgical history (TKA) last month.  His D-dimer is 7.  He is not hypoxic, but he does feel short of breath.  He does not have any chest pain.  CT PE shows no evidence of PE.  Opacities are again demonstrated.  There is also a small pleural effusion and a pericardial effusion.  Patient receiving Cardizem, and does seem to be having some improvement.  He remains alert and oriented.  [place] placed to cardiology. 0115 No word from cards, repaged by me [phone] Still no call back from cardiology, repaged by secretary.  1:43 AM Discussed with Dr. Alcario Drought, states pt. May be too unstable for medicine.  Still waiting on recs from cardiology.  Will f/u with Dr. Alcario Drought after discussion with cards.  2:19 AM Discussed again with Dr. Alcario Drought, who has seen the patient.  He will place a stepdown bed orders.  Cardiology consult still pending.   Final Clinical Impression(s) / ED Diagnoses Final diagnoses:  Atrial fibrillation with rapid ventricular response ([place])    Rx / DC Orders ED Discharge Orders    None  Montine Circle, PA-C 04/16/19 0224    Lennice Sites, DO 04/16/19 1531

## 2019-04-16 ENCOUNTER — Other Ambulatory Visit: Payer: Self-pay

## 2019-04-16 ENCOUNTER — Inpatient Hospital Stay (HOSPITAL_COMMUNITY): Payer: Medicare Other

## 2019-04-16 ENCOUNTER — Emergency Department (HOSPITAL_COMMUNITY): Payer: Medicare Other

## 2019-04-16 ENCOUNTER — Encounter (HOSPITAL_COMMUNITY): Payer: Self-pay | Admitting: Internal Medicine

## 2019-04-16 DIAGNOSIS — Z8249 Family history of ischemic heart disease and other diseases of the circulatory system: Secondary | ICD-10-CM | POA: Diagnosis not present

## 2019-04-16 DIAGNOSIS — Z87891 Personal history of nicotine dependence: Secondary | ICD-10-CM | POA: Diagnosis not present

## 2019-04-16 DIAGNOSIS — E785 Hyperlipidemia, unspecified: Secondary | ICD-10-CM | POA: Diagnosis present

## 2019-04-16 DIAGNOSIS — K219 Gastro-esophageal reflux disease without esophagitis: Secondary | ICD-10-CM | POA: Diagnosis present

## 2019-04-16 DIAGNOSIS — Z803 Family history of malignant neoplasm of breast: Secondary | ICD-10-CM | POA: Diagnosis not present

## 2019-04-16 DIAGNOSIS — Z8546 Personal history of malignant neoplasm of prostate: Secondary | ICD-10-CM | POA: Diagnosis not present

## 2019-04-16 DIAGNOSIS — Z7982 Long term (current) use of aspirin: Secondary | ICD-10-CM | POA: Diagnosis not present

## 2019-04-16 DIAGNOSIS — Z79899 Other long term (current) drug therapy: Secondary | ICD-10-CM | POA: Diagnosis not present

## 2019-04-16 DIAGNOSIS — I4891 Unspecified atrial fibrillation: Secondary | ICD-10-CM

## 2019-04-16 DIAGNOSIS — I480 Paroxysmal atrial fibrillation: Secondary | ICD-10-CM | POA: Diagnosis present

## 2019-04-16 DIAGNOSIS — I959 Hypotension, unspecified: Secondary | ICD-10-CM | POA: Diagnosis present

## 2019-04-16 DIAGNOSIS — C3490 Malignant neoplasm of unspecified part of unspecified bronchus or lung: Secondary | ICD-10-CM

## 2019-04-16 DIAGNOSIS — D630 Anemia in neoplastic disease: Secondary | ICD-10-CM | POA: Diagnosis present

## 2019-04-16 DIAGNOSIS — E039 Hypothyroidism, unspecified: Secondary | ICD-10-CM | POA: Diagnosis present

## 2019-04-16 DIAGNOSIS — Z20822 Contact with and (suspected) exposure to covid-19: Secondary | ICD-10-CM | POA: Diagnosis present

## 2019-04-16 DIAGNOSIS — Z888 Allergy status to other drugs, medicaments and biological substances status: Secondary | ICD-10-CM | POA: Diagnosis not present

## 2019-04-16 DIAGNOSIS — I313 Pericardial effusion (noninflammatory): Secondary | ICD-10-CM | POA: Diagnosis present

## 2019-04-16 DIAGNOSIS — F4310 Post-traumatic stress disorder, unspecified: Secondary | ICD-10-CM | POA: Diagnosis present

## 2019-04-16 DIAGNOSIS — I248 Other forms of acute ischemic heart disease: Secondary | ICD-10-CM | POA: Diagnosis present

## 2019-04-16 DIAGNOSIS — I10 Essential (primary) hypertension: Secondary | ICD-10-CM | POA: Diagnosis present

## 2019-04-16 DIAGNOSIS — Z79890 Hormone replacement therapy (postmenopausal): Secondary | ICD-10-CM | POA: Diagnosis not present

## 2019-04-16 DIAGNOSIS — C3491 Malignant neoplasm of unspecified part of right bronchus or lung: Secondary | ICD-10-CM | POA: Diagnosis present

## 2019-04-16 DIAGNOSIS — J449 Chronic obstructive pulmonary disease, unspecified: Secondary | ICD-10-CM | POA: Diagnosis present

## 2019-04-16 DIAGNOSIS — N179 Acute kidney failure, unspecified: Secondary | ICD-10-CM | POA: Diagnosis present

## 2019-04-16 DIAGNOSIS — Z96652 Presence of left artificial knee joint: Secondary | ICD-10-CM | POA: Diagnosis present

## 2019-04-16 LAB — CBC
HCT: 30.9 % — ABNORMAL LOW (ref 39.0–52.0)
Hemoglobin: 9.9 g/dL — ABNORMAL LOW (ref 13.0–17.0)
MCH: 30.1 pg (ref 26.0–34.0)
MCHC: 32 g/dL (ref 30.0–36.0)
MCV: 93.9 fL (ref 80.0–100.0)
Platelets: 204 10*3/uL (ref 150–400)
RBC: 3.29 MIL/uL — ABNORMAL LOW (ref 4.22–5.81)
RDW: 14.6 % (ref 11.5–15.5)
WBC: 4.4 10*3/uL (ref 4.0–10.5)
nRBC: 0 % (ref 0.0–0.2)

## 2019-04-16 LAB — BASIC METABOLIC PANEL
Anion gap: 9 (ref 5–15)
BUN: 16 mg/dL (ref 8–23)
CO2: 21 mmol/L — ABNORMAL LOW (ref 22–32)
Calcium: 9.6 mg/dL (ref 8.9–10.3)
Chloride: 108 mmol/L (ref 98–111)
Creatinine, Ser: 1.21 mg/dL (ref 0.61–1.24)
GFR calc Af Amer: >60 mL/min (ref >60)
GFR calc non Af Amer: 57 mL/min — ABNORMAL LOW (ref >60)
Glucose, Bld: 161 mg/dL — ABNORMAL HIGH (ref 70–99)
Potassium: 4 mmol/L (ref 3.5–5.1)
Sodium: 138 mmol/L (ref 135–145)

## 2019-04-16 LAB — ECHOCARDIOGRAM COMPLETE
Height: 68 in
Weight: 2476.21 oz

## 2019-04-16 LAB — LACTIC ACID, PLASMA: Lactic Acid, Venous: 1.3 mmol/L (ref 0.5–1.9)

## 2019-04-16 LAB — RESPIRATORY PANEL BY RT PCR (FLU A&B, COVID)
Influenza A by PCR: NEGATIVE
Influenza B by PCR: NEGATIVE
SARS Coronavirus 2 by RT PCR: NEGATIVE

## 2019-04-16 LAB — TROPONIN I (HIGH SENSITIVITY): Troponin I (High Sensitivity): 111 ng/L (ref <18)

## 2019-04-16 LAB — PROCALCITONIN: Procalcitonin: <0.1 ng/mL

## 2019-04-16 LAB — TSH: TSH: 26.32 u[IU]/mL — ABNORMAL HIGH (ref 0.350–4.500)

## 2019-04-16 LAB — PROTIME-INR
INR: 1.2 (ref 0.8–1.2)
Prothrombin Time: 15.2 seconds (ref 11.4–15.2)

## 2019-04-16 LAB — APTT: aPTT: 27 seconds (ref 24–36)

## 2019-04-16 MED ORDER — AMIODARONE HCL IN DEXTROSE 360-4.14 MG/200ML-% IV SOLN: 60.0000 mg/h | INTRAVENOUS | Status: DC

## 2019-04-16 MED ORDER — AMIODARONE LOAD VIA INFUSION
150.0000 mg | Freq: Once | INTRAVENOUS | Status: DC
  Filled 2019-04-16: qty 83.34

## 2019-04-16 MED ORDER — ASPIRIN 81 MG PO CHEW
81.0000 mg | CHEWABLE_TABLET | Freq: Every day | ORAL | Status: DC
  Administered 2019-04-16: 09:00:00 81 mg via ORAL
  Filled 2019-04-16: qty 1

## 2019-04-16 MED ORDER — METRONIDAZOLE IN NACL 5-0.79 MG/ML-% IV SOLN
500.0000 mg | Freq: Once | INTRAVENOUS | Status: AC
  Administered 2019-04-16: 02:00:00 500 mg via INTRAVENOUS
  Filled 2019-04-16: qty 100

## 2019-04-16 MED ORDER — SODIUM CHLORIDE 0.9 % IV SOLN
2.0000 g | Freq: Three times a day (TID) | INTRAVENOUS | Status: DC
  Administered 2019-04-16: 2 g via INTRAVENOUS
  Filled 2019-04-16 (×2): qty 2

## 2019-04-16 MED ORDER — MELOXICAM 7.5 MG PO TABS
15.0000 mg | ORAL_TABLET | Freq: Every day | ORAL | Status: DC | PRN
  Filled 2019-04-16: qty 2

## 2019-04-16 MED ORDER — PANTOPRAZOLE SODIUM 40 MG PO TBEC
40.0000 mg | DELAYED_RELEASE_TABLET | Freq: Two times a day (BID) | ORAL | Status: DC
  Administered 2019-04-16 – 2019-04-21 (×9): 40 mg via ORAL
  Filled 2019-04-16 (×3): qty 1
  Filled 2019-04-16: qty 2
  Filled 2019-04-16 (×6): qty 1

## 2019-04-16 MED ORDER — VANCOMYCIN HCL 1500 MG/300ML IV SOLN: 1500.0000 mg | INTRAVENOUS | Status: DC

## 2019-04-16 MED ORDER — POLYVINYL ALCOHOL 1.4 % OP SOLN
1.0000 [drp] | Freq: Two times a day (BID) | OPHTHALMIC | Status: DC
  Administered 2019-04-16 – 2019-04-20 (×9): 1 [drp] via OPHTHALMIC
  Filled 2019-04-16: qty 15

## 2019-04-16 MED ORDER — HEPARIN BOLUS VIA INFUSION
4000.0000 [IU] | Freq: Once | INTRAVENOUS | Status: AC
  Administered 2019-04-16: 04:00:00 4000 [IU] via INTRAVENOUS
  Filled 2019-04-16: qty 4000

## 2019-04-16 MED ORDER — CALCIUM CARBONATE-VITAMIN D 500-200 MG-UNIT PO TABS
1.0000 | ORAL_TABLET | Freq: Two times a day (BID) | ORAL | Status: DC
  Administered 2019-04-16 – 2019-04-19 (×5): 1 via ORAL
  Filled 2019-04-16 (×6): qty 1

## 2019-04-16 MED ORDER — HEPARIN (PORCINE) 25000 UT/250ML-% IV SOLN
1050.0000 [IU]/h | INTRAVENOUS | Status: DC
  Administered 2019-04-16: 04:00:00 1050 [IU]/h via INTRAVENOUS
  Filled 2019-04-16: qty 250

## 2019-04-16 MED ORDER — PRAVASTATIN SODIUM 10 MG PO TABS
20.0000 mg | ORAL_TABLET | Freq: Every day | ORAL | Status: DC
  Administered 2019-04-16 – 2019-04-20 (×3): 20 mg via ORAL
  Filled 2019-04-16 (×3): qty 2

## 2019-04-16 MED ORDER — TRAZODONE HCL 50 MG PO TABS
50.0000 mg | ORAL_TABLET | Freq: Every day | ORAL | Status: DC
  Administered 2019-04-16 – 2019-04-20 (×4): 50 mg via ORAL
  Filled 2019-04-16 (×4): qty 1

## 2019-04-16 MED ORDER — VANCOMYCIN HCL IN DEXTROSE 1-5 GM/200ML-% IV SOLN
1000.0000 mg | Freq: Once | INTRAVENOUS | Status: DC
  Filled 2019-04-16: qty 200

## 2019-04-16 MED ORDER — ACETAMINOPHEN 325 MG PO TABS: 650.0000 mg | ORAL_TABLET | ORAL | Status: DC | PRN

## 2019-04-16 MED ORDER — ONDANSETRON HCL 4 MG/2ML IJ SOLN
4.0000 mg | Freq: Four times a day (QID) | INTRAMUSCULAR | Status: DC | PRN
  Administered 2019-04-17 – 2019-04-18 (×2): 4 mg via INTRAVENOUS
  Filled 2019-04-16 (×2): qty 2

## 2019-04-16 MED ORDER — LEVOTHYROXINE SODIUM 25 MCG PO TABS
125.0000 ug | ORAL_TABLET | Freq: Every day | ORAL | Status: DC
  Administered 2019-04-16 – 2019-04-17 (×2): 125 ug via ORAL
  Filled 2019-04-16 (×2): qty 1

## 2019-04-16 MED ORDER — AMIODARONE HCL 200 MG PO TABS
200.0000 mg | ORAL_TABLET | Freq: Two times a day (BID) | ORAL | Status: DC
  Administered 2019-04-16 – 2019-04-21 (×9): 200 mg via ORAL
  Filled 2019-04-16 (×9): qty 1

## 2019-04-16 MED ORDER — SODIUM CHLORIDE 0.9 % IV SOLN
2.0000 g | Freq: Two times a day (BID) | INTRAVENOUS | Status: DC
  Administered 2019-04-16 – 2019-04-19 (×6): 2 g via INTRAVENOUS
  Filled 2019-04-16 (×7): qty 2

## 2019-04-16 MED ORDER — LORATADINE 10 MG PO TABS
10.0000 mg | ORAL_TABLET | Freq: Every day | ORAL | Status: DC
  Administered 2019-04-16 – 2019-04-21 (×5): 10 mg via ORAL
  Filled 2019-04-16 (×6): qty 1

## 2019-04-16 MED ORDER — SERTRALINE HCL 50 MG PO TABS
50.0000 mg | ORAL_TABLET | Freq: Every day | ORAL | Status: DC
  Administered 2019-04-16 – 2019-04-20 (×4): 50 mg via ORAL
  Filled 2019-04-16 (×4): qty 1

## 2019-04-16 MED ORDER — VANCOMYCIN HCL 1250 MG/250ML IV SOLN
1250.0000 mg | INTRAVENOUS | Status: DC
  Administered 2019-04-16 – 2019-04-18 (×3): 1250 mg via INTRAVENOUS
  Filled 2019-04-16 (×4): qty 250

## 2019-04-16 MED ORDER — AMIODARONE HCL IN DEXTROSE 360-4.14 MG/200ML-% IV SOLN: 30.0000 mg/h | INTRAVENOUS | Status: DC

## 2019-04-16 MED ORDER — APIXABAN 5 MG PO TABS
5.0000 mg | ORAL_TABLET | Freq: Two times a day (BID) | ORAL | Status: DC
  Administered 2019-04-16 – 2019-04-21 (×9): 5 mg via ORAL
  Filled 2019-04-16 (×9): qty 1

## 2019-04-16 MED ORDER — SODIUM CHLORIDE 0.9 % IV SOLN
2.0000 g | Freq: Once | INTRAVENOUS | Status: AC
  Administered 2019-04-16: 01:00:00 2 g via INTRAVENOUS
  Filled 2019-04-16: qty 2

## 2019-04-16 NOTE — Progress Notes (Signed)
  2D Echocardiogram has been performed.  Merrie Roof F 04/16/2019, 5:07 PM

## 2019-04-16 NOTE — Progress Notes (Addendum)
Pharmacy Antibiotic Note  Carlos Barrera is a 78 y.o. male admitted on 04/15/2019 with sepsis.  Pharmacy has been consulted for vancomycin and cefepime dosing.  Plan: Vancomycin 1500 mg IV q24 hours Cefepime 2gm IV q8 hours F/u renal function, cultures and clinical course     Temp (24hrs), Avg:97.3 F (36.3 C), Min:97.3 F (36.3 C), Max:97.3 F (36.3 C)  Recent Labs  Lab 04/15/19 2227 04/15/19 2230 04/15/19 2245  WBC 6.8  --   --   CREATININE 1.15 1.10  --   LATICACIDVEN  --   --  1.7    CrCl cannot be calculated (Unknown ideal weight.).    Allergies  Allergen Reactions  . Bee Venom Anaphylaxis  . Feldene [Piroxicam] Hives   Thank you for allowing pharmacy to be a part of this patient's care.  Excell Seltzer Poteet 04/16/2019 1:12 AM   Addum:  Start heparin for afib.  Heparin 4000 units bolus and drip at 1050 units/hr.  Check heparin level in ~ 6 hours

## 2019-04-16 NOTE — Progress Notes (Signed)
Per Sepsis guidelines: patient is to receive 2320cc volume resuscitation, if appropriate.

## 2019-04-16 NOTE — ED Provider Notes (Signed)
Medical screening examination/treatment/procedure(s) were conducted as a shared visit with non-physician practitioner(s) and myself.  I personally evaluated the patient during the encounter. Briefly, the patient is a 78 y.o. male with history of hypertension, COPD, possibly new lung cancer diagnosis not currently started on therapy who presents the ED with worsening shortness of breath, progressive weakness.  Patient found to be in A. fib with RVR with EMS and was given normal saline bolus and 20 of diltiazem.  He arrives hypotensive and tachycardic down to the 120s.  He appears pale.  Denies any black stools or bloody stools.  Stool is grossly brown however Hemoccult is positive.  Hemoglobin however stable at 10.  Low concern for GI bleed.  Blood pressure improved following additional saline bolus and patient was started on diltiazem drip for new A. fib with RVR.  He was not having any severe chest pain and overall not experiencing palpitations.  Concerned that A. fib is secondary to infectious process or possibly patient has PE given shortness of breath, hypotension and newly diagnosed cancer.  Does not appear to be a candidate for cardioversion at this time as hemodynamics are improved and not having chest pain or mental status change.  Will initiate infectious work-up including CT scan of chest to evaluate for PE or infection.  Will test for Covid.  Will hold antibiotics at this time.  Urinalysis negative for infection.  Troponin 90 and D-dimer 7.  However, EKG shows A. fib with RVR with no ischemic changes.  Troponin increased likely from demand.  No significant anemia, electrolyte abnormality, kidney injury.  Lactic acid normal.  No leukocytosis.  However, CT scan of the chest shows small to moderate pericardial effusion and also shows likely infectious process as well with trace effusions bilaterally.  Will give antibiotics to cover for infectious process.  Will touch base with cardiology for further  recommendations for atrial fibrillation with RVR.  So far patient appears to be tolerating diltiazem infusion for blood pressure is in the 81X systolic.  He does also have pericardial effusion.  At this time hemodynamically is improving and stable.  Will admit to medicine for further care and physician assistant to touch base with cardiology for further recommendations as well.  First Covid test is negative.  This chart was dictated using voice recognition software.  Despite best efforts to proofread,  errors can occur which can change the documentation meaning.   Carlos Barrera was evaluated in Emergency Department on 04/16/2019 for the symptoms described in the history of present illness. He was evaluated in the context of the global COVID-19 pandemic, which necessitated consideration that the patient might be at risk for infection with the SARS-CoV-2 virus that causes COVID-19. Institutional protocols and algorithms that pertain to the evaluation of patients at risk for COVID-19 are in a state of rapid change based on information released by regulatory bodies including the CDC and federal and state organizations. These policies and algorithms were followed during the patient's care in the ED.  .Critical Care Performed by: Lennice Sites, DO Authorized by: Lennice Sites, DO   Critical care provider statement:    Critical care time (minutes):  35   Critical care was necessary to treat or prevent imminent or life-threatening deterioration of the following conditions:  Cardiac failure   Critical care was time spent personally by me on the following activities:  Blood draw for specimens, development of treatment plan with patient or surrogate, examination of patient, evaluation of patient's response to  treatment, obtaining history from patient or surrogate, ordering and performing treatments and interventions, ordering and review of laboratory studies, ordering and review of radiographic studies, pulse  oximetry, re-evaluation of patient's condition and review of old charts   I assumed direction of critical care for this patient from another provider in my specialty: no        EKG Interpretation  Date/Time:  Saturday April 15 2019 22:16:23 EST Ventricular Rate:  157 PR Interval:    QRS Duration: 81 QT Interval:  294 QTC Calculation: 476 R Axis:   80 Text Interpretation: Atrial fibrillation with rapid V-rate Repolarization abnormality, prob rate related Confirmed by Lennice Sites [phone]) on 04/15/2019 10:43:02 PM           Lennice Sites, DO 04/16/19 0113

## 2019-04-16 NOTE — ED Notes (Signed)
First attempt to call report unsuccessful.

## 2019-04-16 NOTE — Progress Notes (Signed)
PROGRESS NOTE    Carlos Barrera  EPP:[id number] DOB: 03/24/1941 DOA: 04/15/2019 PCP: [facility]   Brief Narrative: Carlos Barrera is a 78 y.o. male with medical history significant of HTN, prostate CA s/p treatment. Patient presented secondary to shortness of breath and found to have afib with RVR. Started on heparin drip and cardizem drip now transitioned to amiodarone and Eliquis. Complicated by hypotension.   Assessment & Plan:   Principal Problem:   Atrial fibrillation with RVR (HCC) Active Problems:   Lung cancer ([place])   New onset atrial fibrillation (HCC)   Hypotension   Atrial fibrillation with RVR New onset. Patient started on Diltiazem/heparin drip on admission. Cardiology consulted and transitioned to amiodarone PO and Eliquis. -Cardiology recommendations: amiodarone, Eliquis  Hypotension Etiology thought to be cardiogenic vs sepsis. Complicated by home lisinopril use. Patient received IV fluid resuscitation in the ED. Chest x-ray suggests edema vs infection. Normal WBC, afebrile. -Continue Vanc/Cefepime -Blood/urine cultures  Hypothyroidism -Continue Synthroid  Essential hypertension On lisinopril on prior to admission which is held secondary to hypotension  Lung cancer Recently diagnosed. Outpatient follow-up.  Hyperlipidemia -continue pravastatin   DVT prophylaxis: Eliquis Code Status:   Code Status: Full Code Family Communication: None Disposition Plan: Discharge pending blood cultures and cardiology recs   Consultants:   Cardiology  Procedures:   None  Antimicrobials:  Vancomycin  Cefepime    Subjective: Wants left IV taken out.  Objective: Vitals:   04/16/19 0444 04/16/19 0602 04/16/19 0800 04/16/19 0900  BP: 107/76 95/67  (!) 105/54  Pulse:  66  81  Resp: 19 12  16   Temp: (!) 97.5 F (36.4 C)  97.6 F (36.4 C) 97.6 F (36.4 C)  TempSrc: Oral  Oral Oral  SpO2: 98% 97%  99%  Weight: 70.2 kg     Height: 5'  8" (1.727 m)       Intake/Output Summary (Last 24 hours) at 04/16/2019 1546 Last data filed at 04/16/2019 1014 Gross per 24 hour  Intake 376.15 ml  Output 300 ml  Net 76.15 ml   Filed Weights   04/16/19 0444  Weight: 70.2 kg    Examination:  General exam: Appears calm and comfortable Respiratory system: Clear to auscultation. Respiratory effort normal. Cardiovascular system: S1 & S2 heard, RRR. No murmurs, rubs, gallops or clicks. Gastrointestinal system: Abdomen is nondistended, soft and nontender. No organomegaly or masses felt. Normal bowel sounds heard. Central nervous system: Alert and oriented. No focal neurological deficits. Extremities: No edema. No calf tenderness Skin: No cyanosis. No rashes Psychiatry: Judgement and insight appear normal. Mood & affect appropriate.     Data Reviewed: I have personally reviewed following labs and imaging studies  CBC: Recent Labs  Lab 04/15/19 2227 04/15/19 2230 04/16/19 0638  WBC 6.8  --  4.4  NEUTROABS 5.7  --   --   HGB 10.3* 9.9* 9.9*  HCT 32.1* 29.0* 30.9*  MCV 94.7  --  93.9  PLT 210  --  606   Basic Metabolic Panel: Recent Labs  Lab 04/15/19 2227 04/15/19 2230 04/16/19 0638  NA 139 138 138  K 3.8 3.8 4.0  CL 110 106 108  CO2 22  --  21*  GLUCOSE 130* 126* 161*  BUN 15 16 16   CREATININE 1.15 1.10 1.21  CALCIUM 9.2  --  9.6   GFR: Estimated Creatinine Clearance: 48.7 mL/min (by C-G formula based on SCr of 1.21 mg/dL). Liver Function Tests: Recent Labs  Lab 04/15/19  2242  AST 16  ALT 14  ALKPHOS 87  BILITOT 0.4  PROT 5.6*  ALBUMIN 2.7*   Recent Labs  Lab 04/15/19 2242  LIPASE 40   No results for input(s): AMMONIA in the last 168 hours. Coagulation Profile: Recent Labs  Lab 04/16/19 0043  INR 1.2   Cardiac Enzymes: No results for input(s): CKTOTAL, CKMB, CKMBINDEX, TROPONINI in the last 168 hours. BNP (last 3 results) No results for input(s): PROBNP in the last 8760 hours. HbA1C: No  results for input(s): HGBA1C in the last 72 hours. CBG: No results for input(s): GLUCAP in the last 168 hours. Lipid Profile: No results for input(s): CHOL, HDL, LDLCALC, TRIG, CHOLHDL, LDLDIRECT in the last 72 hours. Thyroid Function Tests: Recent Labs    04/16/19 0945  TSH 26.320*   Anemia Panel: No results for input(s): VITAMINB12, FOLATE, FERRITIN, TIBC, IRON, RETICCTPCT in the last 72 hours. Sepsis Labs: Recent Labs  Lab 04/15/19 2245 04/16/19 0045 04/16/19 0220  PROCALCITON  --   --  <0.10  LATICACIDVEN 1.7 1.3  --     Recent Results (from the past 240 hour(s))  Respiratory Panel by RT PCR (Flu A&B, Covid) - Nasopharyngeal Swab     Status: None   Collection Time: 04/16/19  2:01 AM   Specimen: Nasopharyngeal Swab  Result Value Ref Range Status   SARS Coronavirus 2 by RT PCR NEGATIVE NEGATIVE Final    Comment: (NOTE) SARS-CoV-2 target nucleic acids are NOT DETECTED. The SARS-CoV-2 RNA is generally detectable in upper respiratoy specimens during the acute phase of infection. The lowest concentration of SARS-CoV-2 viral copies this assay can detect is 131 copies/mL. A negative result does not preclude SARS-Cov-2 infection and should not be used as the sole basis for treatment or other patient management decisions. A negative result may occur with  improper specimen collection/handling, submission of specimen other than nasopharyngeal swab, presence of viral mutation(s) within the areas targeted by this assay, and inadequate number of viral copies (<131 copies/mL). A negative result must be combined with clinical observations, patient history, and epidemiological information. The expected result is Negative. Fact Sheet for Patients:  PinkCheek.be Fact Sheet for Healthcare Providers:  GravelBags.it This test is not yet ap proved or cleared by the [place] FDA and  has been authorized for detection and/or  diagnosis of SARS-CoV-2 by FDA under an Emergency Use Authorization (EUA). This EUA will remain  in effect (meaning this test can be used) for the duration of the COVID-19 declaration under Section 564(b)(1) of the Act, 21 U.S.C. section 360bbb-3(b)(1), unless the authorization is terminated or revoked sooner.    Influenza A by PCR NEGATIVE NEGATIVE Final   Influenza B by PCR NEGATIVE NEGATIVE Final    Comment: (NOTE) The Xpert Xpress SARS-CoV-2/FLU/RSV assay is intended as an aid in  the diagnosis of influenza from Nasopharyngeal swab specimens and  should not be used as a sole basis for treatment. Nasal washings and  aspirates are unacceptable for Xpert Xpress SARS-CoV-2/FLU/RSV  testing. Fact Sheet for Patients: PinkCheek.be Fact Sheet for Healthcare Providers: GravelBags.it This test is not yet approved or cleared by the [place] FDA and  has been authorized for detection and/or diagnosis of SARS-CoV-2 by  FDA under an Emergency Use Authorization (EUA). This EUA will remain  in effect (meaning this test can be used) for the duration of the  Covid-19 declaration under Section 564(b)(1) of the Act, 21  U.S.C. section 360bbb-3(b)(1), unless the authorization is  terminated or  revoked. Performed at [facility], [place] [address], [place], [place] [address]          Radiology Studies: CT Angio Chest PE W and/or Wo Contrast  Result Date: 04/16/2019 CLINICAL DATA:  Shortness of breath EXAM: CT ANGIOGRAPHY CHEST WITH CONTRAST TECHNIQUE: Multidetector CT imaging of the chest was performed using the standard protocol during bolus administration of intravenous contrast. Multiplanar CT image reconstructions and MIPs were obtained to evaluate the vascular anatomy. CONTRAST:  130mL OMNIPAQUE IOHEXOL 350 MG/ML SOLN COMPARISON:  None. FINDINGS: Cardiovascular: There is a optimal opacification of the pulmonary arteries. There  is no central,segmental, or subsegmental filling defects within the pulmonary arteries. There is mild cardiomegaly. A small to moderate pericardial effusion is present. Mild scattered aortic atherosclerosis is seen. There is normal three-vessel brachiocephalic anatomy. Mediastinum/Nodes: Subcarinal adenopathy is seen measuring 2.2 cm in AP dimension. There is also a right hilar adenopathy which measures 1.5 cm in AP dimension. Thyroid gland, trachea, and esophagus demonstrate no significant findings. Lungs/Pleura: There is small peripheral patchy airspace opacity seen within the posterior right upper and lower lung. There is also tree-in-bud opacity seen in the periphery of the right lung base. There is a small right and trace left pleural effusion. Mildly increased interstitial thickening seen within both upper lungs. Upper Abdomen: No acute abnormalities present in the visualized portions of the upper abdomen. Musculoskeletal: No chest wall abnormality. No acute or significant osseous findings. Review of the MIP images confirms the above findings. IMPRESSION: No central, segmental, or subsegmental pulmonary embolism. Small to moderate pericardial effusion Subcarinal and right hilar adenopathy which could be reactive. Patchy/ground-glass opacity in the posterior right lung which could be due to atelectasis and/or infectious etiology. Small right and trace left pleural effusion. Mildly increased interstitial thickening seen within both upper lungs likely due to mild interstitial edema. Aortic Atherosclerosis (ICD10-I70.0). Electronically Signed   By: Prudencio Pair M.D.   On: 04/16/2019 00:04   DG Chest Portable 1 View  Result Date: 04/16/2019 CLINICAL DATA:  Shortness of breath EXAM: PORTABLE CHEST 1 VIEW COMPARISON:  None. FINDINGS: The heart size and mediastinal contours are within normal limits. There is mildly increased interstitial markings seen throughout both lungs. There is patchy airspace opacity at the  periphery of the right lung base. The visualized skeletal structures are unremarkable. IMPRESSION: Mildly increased interstitial markings throughout both lungs with patchy airspace opacity in the right lung base. This could be due to asymmetric edema and/or infectious etiology. Electronically Signed   By: Prudencio Pair M.D.   On: 04/16/2019 00:39        Scheduled Meds: . amiodarone  200 mg Oral BID  . apixaban  5 mg Oral BID  . calcium-vitamin D  1 tablet Oral BID WC  . levothyroxine  125 mcg Oral Q0600  . loratadine  10 mg Oral Daily  . pantoprazole  40 mg Oral BID  . polyvinyl alcohol  1 drop Both Eyes BID  . pravastatin  20 mg Oral QHS  . sertraline  50 mg Oral QHS  . traZODone  50 mg Oral QHS   Continuous Infusions: . ceFEPime (MAXIPIME) IV    . vancomycin       LOS: 0 days     Cordelia Poche, MD [facility] 04/16/2019, 3:46 PM  If 7PM-7AM, please contact night-coverage [email or web address]

## 2019-04-16 NOTE — ED Notes (Signed)
EDP and Admitting Physician notified of pt elevated troponin.

## 2019-04-16 NOTE — ED Notes (Signed)
MD and PA made aware of pt hypotension. No new orders at this time.

## 2019-04-16 NOTE — Consult Note (Addendum)
Cardiology Consult    Patient ID: Carlos Barrera MRN: [id number], DOB/AGE: December 31, 1940   Admit date: 04/15/2019 Date of Consult: 04/16/2019  Primary Physician: [facility], [facility] Primary Cardiologist: Kate Sable, MD  - NEW - pt will f/u in [facility] Requesting Provider: R. Lonny Prude, MD  Patient Profile    Carlos Barrera is a 78 y.o. male with a history of remote tob abuse, COPD, HTN, HL, hypothyroidism, PTSD, agent orange exposure, prostate cancer, and recent dx of lung cancer [facility]), who is being seen today for the evaluation of Afib w/ RVR at the request of Dr. Lonny Prude.  Past Medical History   Past Medical History:  Diagnosis Date  . Agent orange exposure   . Asthma    exercise induced asthma   . COPD (chronic obstructive pulmonary disease) ([place])   . DJD (degenerative joint disease)    a. 02/2019 s/p L TKA.  Marland Kitchen GERD (gastroesophageal reflux disease)   . Hearing loss    wears hearing aids  . History of cardiac cath    a. History of 2 cardiac catheterizations, last ~ 10 yrs ago @ [facility]-->reportedly nl.  . History of stress test    a. 2020 - pharmacologic stress testing @ [facility]-->reportedly nl.  . Hyperlipidemia   . Hypertension   . Hypothyroidism   . Insomnia   . Lung cancer ([place])    a. Dx 03/2019.  Marland Kitchen Prostate cancer ([place])   . PTSD (post-traumatic stress disorder)   . Seasonal allergies   . Thyroid disease     Past Surgical History:  Procedure Laterality Date  . BACK SURGERY     x 2  . COLONOSCOPY     hx polyps  . HERNIA REPAIR    . INCISE AND DRAIN ABCESS     patient denies this procedure 03/03/19  . NECK SURGERY     x 1  . TONSILLECTOMY    . 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 TOTAL KNEE ARTHROPLASTY Left 03/09/2019   Procedure: LEFT TOTAL KNEE ARTHROPLASTY;  Surgeon: Meredith Pel, MD;  Location: [facility];  Service: Orthopedics;  Laterality: Left;  . WISDOM TOOTH EXTRACTION       Allergies  Allergies  Allergen  Reactions  . Bee Venom Anaphylaxis  . Feldene [Piroxicam] Hives    History of Present Illness    78 y/o ? 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  He notes prior cardiac catheterizations performed in the setting of DOE, last ~ 10 yrs ago, with reportedly nl cors.  He is s/p L TKA in November.  Over the past year, he has noted progressive functional decline w/ severe DOE w/ minimal activity.  He underwent stress testing @ some point this year @ the [place], which he reports as being normal.  Despite adjustment of outpt inhaler therapy, and outpt pulmonary rehab @ [facility], dyspnea persisted and progressed.  He reports that a CT was performed @ the [place], which was abnl and led to bronchoscopy ~ 10 days ago, with pathology + for malignancy.  This past week, he underwent head CT and he was told that there is an area of concern and so now he has been referred to the [place] @ [facility] for further eval (PET scan scheduled for 12/30).  On the evening of 12/26, he developed heartburn at some point after  dinner, which he thinks improved after using TUMS.  He fell off to sleep and then around 8:30p, he awoke suddenly w/ profound dyspnea.  He called EMS and was found to be rapid Afib.  He was treated w/ a dilt bolus with improved heart rates but also hypotension req 1L saline bolus.  He was placed on dilt gtt in the ED.  CTA of the chest neg for PE.  Creat mildly elevated above prior baseline @ 1.21.  H/H 9.9/30.9, which is similar to recent recordings.  First HsTrop 90 w/ 111 on f/u.  He was admitted and placed on heparin.  Due to hypotension, IV dilt was d/c'd this AM, however, pt has also converted to sinus rhythm and currently feels well.  Inpatient Medications    . aspirin  81 mg Oral Daily  . calcium-vitamin D  1 tablet Oral BID WC  . levothyroxine  125 mcg Oral Q0600  .  loratadine  10 mg Oral Daily  . pantoprazole  40 mg Oral BID  . polyvinyl alcohol  1 drop Both Eyes BID  . pravastatin  20 mg Oral QHS  . sertraline  50 mg Oral QHS  . traZODone  50 mg Oral QHS    Family History    Family History  Problem Relation Age of Onset  . Breast cancer Mother        w/ brain mets -> died in her 59's.  Marland Kitchen Heart attack Father        died @ 26  . Other Brother        multiple medical issues, pt unaware of specifics.   He indicated that his mother is deceased. He indicated that his father is deceased. He indicated that his brother is alive.   Social History    Social History   Socioeconomic History  . Marital status: Married    Spouse name: Not on file  . Number of children: Not on file  . Years of education: Not on file  . Highest education level: Not on file  Occupational History  . Not on file  Tobacco Use  . Smoking status: Former Smoker    Packs/day: 2.00    Years: 20.00    Pack years: 40.00    Types: Cigarettes    Quit date: 04/20/1980    Years since quitting: 39.0  . Smokeless tobacco: Never Used  Substance and Sexual Activity  . Alcohol use: No  . Drug use: No  . Sexual activity: Yes    Birth control/protection: None  Other Topics Concern  . Not on file  Social History Narrative   Lives locally by himself.  Attends pulm rehab @ [facility] but says activity is very limited due to [place].   Social Determinants of Health   Financial Resource Strain:   . Difficulty of Paying Living Expenses: Not on file  Food Insecurity:   . Worried About Charity fundraiser in the Last Year: Not on file  . Ran Out of Food in the Last Year: Not on file  Transportation Needs:   . Lack of Transportation (Medical): Not on file  . Lack of Transportation (Non-Medical): Not on file  Physical Activity:   . Days of Exercise per Week: Not on file  . Minutes of Exercise per Session: Not on file  Stress:   . Feeling of Stress : Not on file  Social Connections:     . Frequency of Communication with Friends and Family: Not on file  .  Frequency of Social Gatherings with Friends and Family: Not on file  . Attends Religious Services: Not on file  . Active Member of Clubs or Organizations: Not on file  . Attends Archivist Meetings: Not on file  . Marital Status: Not on file  Intimate Partner Violence:   . Fear of Current or Ex-Partner: Not on file  . Emotionally Abused: Not on file  . Physically Abused: Not on file  . Sexually Abused: Not on file     Review of Systems    General:  No chills, fever, night sweats or weight changes.  Cardiovascular:  No chest pain, +++ dyspnea @ rest last night and progressive, severe dyspnea on exertion over the past year.  No edema, orthopnea, palpitations, paroxysmal nocturnal dyspnea. Episode of profound weakness and presyncope several wks ago. Dermatological: No rash, lesions/masses Respiratory: No cough, +++ dyspnea Urologic: No hematuria, dysuria Abdominal:   No nausea, vomiting, diarrhea, bright red blood per rectum, melena, or hematemesis Neurologic:  No visual changes, wkns, changes in mental status. All other systems reviewed and are otherwise negative except as noted above.  Physical Exam    Blood pressure (!) 105/54, pulse 81, temperature 97.6 F (36.4 C), temperature source Oral, resp. rate 16, height 5\' 8"  (1.727 m), weight 70.2 kg, SpO2 99 %.  General: Pleasant, NAD Psych: Normal affect. Neuro: Alert and oriented X 3. Moves all extremities spontaneously. HEENT: Normal  Neck: Supple without bruits or JVD. Lungs:  Resp regular and unlabored, CTA. Heart: RRR, distant, no s3, s4, or murmurs. Abdomen: Soft, non-tender, non-distended, BS + x 4. Occasionally notes blood on toilet paper after straining for BM. Extremities: No clubbing, cyanosis or edema. DP/PT/Radials 2+ and equal bilaterally.  Labs    Cardiac Enzymes Recent Labs  Lab 04/15/19 2227 04/16/19 0027  TROPONINIHS 90* 111*       Lab Results  Component Value Date   WBC 4.4 04/16/2019   HGB 9.9 (L) 04/16/2019   HCT 30.9 (L) 04/16/2019   MCV 93.9 04/16/2019   PLT 204 04/16/2019    Recent Labs  Lab 04/15/19 2242 04/16/19 0638  NA  --  138  K  --  4.0  CL  --  108  CO2  --  21*  BUN  --  16  CREATININE  --  1.21  CALCIUM  --  9.6  PROT 5.6*  --   BILITOT 0.4  --   ALKPHOS 87  --   ALT 14  --   AST 16  --   GLUCOSE  --  161*   Lab Results  Component Value Date   DDIMER 7.31 (H) 04/15/2019     Radiology Studies    CT Angio Chest PE W and/or Wo Contrast  Result Date: 04/16/2019 CLINICAL DATA:  Shortness of breath EXAM: CT ANGIOGRAPHY CHEST WITH CONTRAST TECHNIQUE: Multidetector CT imaging of the chest was performed using the standard protocol during bolus administration of intravenous contrast. Multiplanar CT image reconstructions and MIPs were obtained to evaluate the vascular anatomy. CONTRAST:  139mL OMNIPAQUE IOHEXOL 350 MG/ML SOLN COMPARISON:  None. FINDINGS: Cardiovascular: There is a optimal opacification of the pulmonary arteries. There is no central,segmental, or subsegmental filling defects within the pulmonary arteries. There is mild cardiomegaly. A small to moderate pericardial effusion is present. Mild scattered aortic atherosclerosis is seen. There is normal three-vessel brachiocephalic anatomy. Mediastinum/Nodes: Subcarinal adenopathy is seen measuring 2.2 cm in AP dimension. There is also a right hilar adenopathy which measures  1.5 cm in AP dimension. Thyroid gland, trachea, and esophagus demonstrate no significant findings. Lungs/Pleura: There is small peripheral patchy airspace opacity seen within the posterior right upper and lower lung. There is also tree-in-bud opacity seen in the periphery of the right lung base. There is a small right and trace left pleural effusion. Mildly increased interstitial thickening seen within both upper lungs. Upper Abdomen: No acute abnormalities present in  the visualized portions of the upper abdomen. Musculoskeletal: No chest wall abnormality. No acute or significant osseous findings. Review of the MIP images confirms the above findings. IMPRESSION: No central, segmental, or subsegmental pulmonary embolism. Small to moderate pericardial effusion Subcarinal and right hilar adenopathy which could be reactive. Patchy/ground-glass opacity in the posterior right lung which could be due to atelectasis and/or infectious etiology. Small right and trace left pleural effusion. Mildly increased interstitial thickening seen within both upper lungs likely due to mild interstitial edema. Aortic Atherosclerosis (ICD10-I70.0). Electronically Signed   By: Prudencio Pair M.D.   On: 04/16/2019 00:04   DG Chest Portable 1 View  Result Date: 04/16/2019 CLINICAL DATA:  Shortness of breath EXAM: PORTABLE CHEST 1 VIEW COMPARISON:  None. FINDINGS: The heart size and mediastinal contours are within normal limits. There is mildly increased interstitial markings seen throughout both lungs. There is patchy airspace opacity at the periphery of the right lung base. The visualized skeletal structures are unremarkable. IMPRESSION: Mildly increased interstitial markings throughout both lungs with patchy airspace opacity in the right lung base. This could be due to asymmetric edema and/or infectious etiology. Electronically Signed   By: Prudencio Pair M.D.   On: 04/16/2019 00:39   XR Knee 1-2 Views Left  Result Date: 03/25/2019 AP lateral left knee reviewed.  Total knee prosthesis in good position alignment with no complicating features.   ECG & Cardiac Imaging    Afib, 157, mild inferolat ST depression - personally reviewed.  Assessment & Plan    1.  Afib w/ RVR:  Pt presented to the emergency department on the evening of December 26 after awakening with sudden onset of dyspnea.  He was found to be in A. fib with RVR by EMS and was treated with diltiazem bolus with some improvement in  rate control, followed by IV diltiazem infusion.  Pressures have been soft overnight and IV diltiazem was discontinued this morning however, patient has since converted to sinus rhythm.  He has no known prior history of atrial fibrillation and denies any history of palpitations.  He has undergone cardiac evaluation earlier this year with stress testing in the setting of progressive dyspnea on exertion, which was reportedly normal.  CHA2DS2VASc equals 3.  I will stop heparin infusion initiate Eliquis 5 mg twice daily.  Given recent lung cancer diagnosis and presumed additional therapies associated with that, his propensity for recurrent atrial fibrillation appears to be high.  In that setting, we will seek a rhythm control strategy and initiate oral amiodarone at 200 mg twice daily.  He had previously been taking lisinopril at home for hypertension and we will replace this with metoprolol 12.5 twice daily, which can be titrated to effect.  Follow-up echocardiogram today.  Follow-up TSH with prior history of hypothyroidism.  We will arrange for follow-up in our [place] office at his request.  2.  Essential hypertension: Blood pressure has been soft since admission.  Likely secondary to IV diltiazem which has since been discontinued.  He had been on lisinopril at home.  Recommend discontinuation of this in  favor of low-dose beta-blocker in the setting of A. fib.  3.  Lung cancer: Recent diagnosis by bronchoscopy at the [facility].  He has been referred to the cancer center at [facility] and is scheduled for a PET scan on December 30.  4.  Hyperlipidemia: On statin therapy.  5.  Normocytic anemia: Current H&H stable compared to hemoglobin in late November.  I note that in October of this year and going back to last November, hemoglobin was in the 11-13 range.  We will have to watch H&H closely with initiation of oral anticoagulation.  He does report occas blood on toilet paper in the setting of  constipation and straining, but no large volume bleeding or melena.  6.  Acute kidney injury: Mild elevation of creatinine above prior baseline-1.21 this morning.  Follow closely given recent contrast load with CT.  He has received IV fluids.  7.  Elevated troponin/demand ischemia: In the setting of A. fib with RVR, initial troponin was 90 with follow-up of 111.  He denies any history of chest pain.  He reports stress testing performed at the [place] earlier this year which was reportedly normal.  Further, reports prior normal heart catheterizations, the last of which was about 10 years ago.  Adding low-dose beta-blocker.  No aspirin in the setting of Eliquis.  Continue statin therapy.  Defer ischemic evaluation given recent evaluation.  8.  Hypothyroidism:  F/u TSH.  On synthroid.  9.  COPD:  Per IM.  Signed, Murray Hodgkins, NP 04/16/2019, 9:21 AM  For questions or updates, please contact   Please consult [email or web address] for contact info under Cardiology/STEMI.   I have seen and examined the patient along with Murray Hodgkins, NP.  I have reviewed the chart, notes and new data.  I agree with PA/NP's note.  Key new complaints: feels much better this morning Key examination changes: in NSR, normal CV exam Key new findings / data: ECG showed atrial fibrillation (fairly organized, possibly atrial flutter) w RVR. Previous ischemic heart disease workup negative, echo pending.  PLAN: Start DOAC. Unlikely we will be able to achieve good rate control with AV node blocking agents only due to low BP. Stop lisinopril, start metoprolol. Amiodarone load PO. Pros and cons of antiarrhythmic and anticoagulant reviewed. Monitor LFTs/TFTs at least twice yearly.  Sanda Klein, MD, [place] [phone] 04/16/2019, 9:44 AM

## 2019-04-16 NOTE — H&P (Addendum)
History and Physical    Carlos Barrera ZOX:[id number] DOB: 1940/07/29 DOA: 04/15/2019  PCP: [facility]  Patient coming from: Home  I have personally briefly reviewed patient's old medical records in [place]  Chief Complaint: SOB  HPI: Carlos Barrera is a 78 y.o. male with medical history significant of HTN, prostate CA s/p treatment.  Patient has had increasing SOB for past several months, this ultimately diagnosed just this past week as lung CA found on bronchoscopy.  He has not started any treatment / chemo yet.   He states that he began to feel worse over the past few days, and states that he was awakened from sleep tonight with more difficulty breathing.  He called EMS, and was brought to the hospital.  EMS found him to be in A. fib with RVR with rates in the 150s to 170s.  They gave him 1 L of normal saline and 20 mg bolus of Cardizem.  His HR dropped to the 120s-130s, but his BP also dropped into the 80s.   ED Course: In the ED given a 2nd L NS bolus with improvement in BP and started on cardizem gtt at 5.  HR now 120 and BP 811 systolic.  Given empiric cefepime, flagyl, vanc for possible sepsis, though he has no fever, no WBC.  Patient denies any recent: fever, chills, N/V, sick contacts, rash, skin ulcer, abd pain, diarrhea, flank pain, dysuria, knee pain or swelling or redness (knee has been working fine since the TKA last month), cough.  Does have SOB as noted in HPI above.   Review of Systems: As per HPI, otherwise all review of systems negative.  Past Medical History:  Diagnosis Date  . Asthma    exercise induced asthma   . COPD (chronic obstructive pulmonary disease) ([place])   . GERD (gastroesophageal reflux disease)   . Hearing loss    wears hearing aids  . Hyperlipidemia   . Hypertension   . Hypothyroidism   . Insomnia   . Prostate cancer ([place])   . PTSD (post-traumatic stress disorder)   . Seasonal allergies   . Thyroid disease       Past Surgical History:  Procedure Laterality Date  . BACK SURGERY     x 2  . COLONOSCOPY     hx polyps  . HERNIA REPAIR    . INCISE AND DRAIN ABCESS     patient denies this procedure 03/03/19  . NECK SURGERY     x 1  . TONSILLECTOMY    . 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 WISDOM TOOTH EXTRACTION       reports that he quit smoking about 39 years ago. His smoking use included cigarettes. He has a 40.00 pack-year smoking history. He has never used smokeless tobacco. He reports that he does not drink alcohol or use drugs.  Allergies  Allergen Reactions  . Bee Venom Anaphylaxis  . Feldene [Piroxicam] Hives    No family history on file. No sick contacts.  Prior to Admission medications   Medication Sig Start Date End Date Taking? Authorizing Provider  aspirin 81 MG chewable tablet Chew 1 tablet (81 mg total) by mouth daily. 03/14/19  Yes Magnant, Gerrianne Scale, PA-C  Calcium Carbonate-Vitamin D 600-400 MG-UNIT tablet Take 1 tablet by mouth 2 (  two) times daily.    Yes [provider]  cetirizine (ZYRTEC) 10 MG tablet Take 10 mg by mouth daily.  02/13/18  Yes [provider]  guaifenesin (HUMIBID E) 400 MG TABS tablet Take 400 mg by mouth 3 (three) times daily.   Yes [provider]  levothyroxine (SYNTHROID) 125 MCG tablet Take 125 mcg by mouth daily before breakfast.   Yes [provider]  lisinopril (PRINIVIL,ZESTRIL) 10 MG tablet Take 10 mg by mouth daily.   Yes [provider]  Meloxicam 15 MG TBDP Take 15 mg by mouth daily as needed (pain.).    Yes [provider]  omeprazole (PRILOSEC) 20 MG capsule Take 20 mg by mouth 2 (two) times daily as needed (acid reflux/indigestion.).  10/17/18  Yes [provider]  pravastatin (PRAVACHOL)  20 MG tablet Take 20 mg by mouth at bedtime.   Yes [provider]  prazosin (MINIPRESS) 5 MG capsule Take 10 mg by mouth at bedtime.  02/21/18  Yes [provider]  REFRESH TEARS 0.5 % SOLN Place 1 drop into both eyes 2 (two) times daily.  02/13/18  Yes [provider]  sertraline (ZOLOFT) 50 MG tablet Take 50 mg by mouth at bedtime.  12/14/18  Yes [provider]  tamsulosin (FLOMAX) 0.4 MG CAPS capsule Take 0.4 mg by mouth daily.  01/06/19  Yes [provider]  traZODone (DESYREL) 50 MG tablet Take 50 mg by mouth at bedtime.   Yes [provider]  methocarbamol (ROBAXIN) 500 MG tablet Take 1 tablet (500 mg total) by mouth every 8 (eight) hours as needed for muscle spasms. 03/14/19   Magnant, Charles L, PA-C  oxyCODONE (OXY IR/ROXICODONE) 5 MG immediate release tablet Take 1-2 tablets (5-10 mg total) by mouth every 4 (four) hours as needed for moderate pain (pain score 4-6). 03/14/19   Donella Stade, PA-C    Physical Exam: Vitals:   04/16/19 0045 04/16/19 0055 04/16/19 0115 04/16/19 0200  BP: (!) 87/61 93/75 (!) 85/65 100/84  Pulse:  (!) 134 (!) 119   Resp:  (!) 21 16 20   Temp:      TempSrc:      SpO2: 96% 100% 99%     Constitutional: NAD, calm, comfortable Eyes: PERRL, lids and conjunctivae normal ENMT: Mucous membranes are moist. Posterior pharynx clear of any exudate or lesions.Normal dentition.  Neck: normal, supple, no masses, no thyromegaly Respiratory: clear to auscultation bilaterally, no wheezing, no crackles. Normal respiratory effort. No accessory muscle use.  Cardiovascular: Regular rate and rhythm, no murmurs / rubs / gallops. No extremity edema. 2+ pedal pulses. No carotid bruits.  Abdomen: no tenderness, no masses palpated. No hepatosplenomegaly. Bowel sounds positive.  Musculoskeletal: no clubbing / cyanosis. No joint deformity upper and lower extremities. Good ROM, no contractures. Normal muscle tone.  Skin: no  rashes, lesions, ulcers. No induration Neurologic: CN 2-12 grossly intact. Sensation intact, DTR normal. Strength 5/5 in all 4.  Psychiatric: Normal judgment and insight. Alert and oriented x 3. Normal mood.    Labs on Admission: I have personally reviewed following labs and imaging studies  CBC: Recent Labs  Lab 04/15/19 2227 04/15/19 2230  WBC 6.8  --   NEUTROABS 5.7  --   HGB 10.3* 9.9*  HCT 32.1* 29.0*  MCV 94.7  --   PLT 210  --    Basic Metabolic Panel: Recent Labs  Lab 04/15/19 2227 04/15/19 2230  NA 139 138  K 3.8 3.8  CL 110 106  CO2 22  --   GLUCOSE 130* 126*  BUN 15 16  CREATININE 1.15 1.10  CALCIUM 9.2  --    GFR: CrCl cannot be calculated (Unknown ideal weight.). Liver Function Tests: Recent Labs  Lab 04/15/19 2242  AST 16  ALT 14  ALKPHOS 87  BILITOT 0.4  PROT 5.6*  ALBUMIN 2.7*   Recent Labs  Lab 04/15/19 2242  LIPASE 40   No results for input(s): AMMONIA in the last 168 hours. Coagulation Profile: Recent Labs  Lab 04/16/19 0043  INR 1.2   Cardiac Enzymes: No results for input(s): CKTOTAL, CKMB, CKMBINDEX, TROPONINI in the last 168 hours. BNP (last 3 results) No results for input(s): PROBNP in the last 8760 hours. HbA1C: No results for input(s): HGBA1C in the last 72 hours. CBG: No results for input(s): GLUCAP in the last 168 hours. Lipid Profile: No results for input(s): CHOL, HDL, LDLCALC, TRIG, CHOLHDL, LDLDIRECT in the last 72 hours. Thyroid Function Tests: No results for input(s): TSH, T4TOTAL, FREET4, T3FREE, THYROIDAB in the last 72 hours. Anemia Panel: No results for input(s): VITAMINB12, FOLATE, FERRITIN, TIBC, IRON, RETICCTPCT in the last 72 hours. Urine analysis:    Component Value Date/Time   COLORURINE YELLOW 04/15/2019 2318   APPEARANCEUR CLEAR 04/15/2019 2318   LABSPEC 1.028 04/15/2019 2318   PHURINE 5.0 04/15/2019 2318   GLUCOSEU NEGATIVE 04/15/2019 2318   HGBUR NEGATIVE 04/15/2019 2318   [place]  NEGATIVE 04/15/2019 2318   [place] NEGATIVE 04/15/2019 2318   PROTEINUR 30 (A) 04/15/2019 2318   UROBILINOGEN 1.0 05/22/2009 1525   NITRITE NEGATIVE 04/15/2019 2318   LEUKOCYTESUR NEGATIVE 04/15/2019 2318    Radiological Exams on Admission: CT Angio Chest PE W and/or Wo Contrast  Result Date: 04/16/2019 CLINICAL DATA:  Shortness of breath EXAM: CT ANGIOGRAPHY CHEST WITH CONTRAST TECHNIQUE: Multidetector CT imaging of the chest was performed using the standard protocol during bolus administration of intravenous contrast. Multiplanar CT image reconstructions and MIPs were obtained to evaluate the vascular anatomy. CONTRAST:  160mL OMNIPAQUE IOHEXOL 350 MG/ML SOLN COMPARISON:  None. FINDINGS: Cardiovascular: There is a optimal opacification of the pulmonary arteries. There is no central,segmental, or subsegmental filling defects within the pulmonary arteries. There is mild cardiomegaly. A small to moderate pericardial effusion is present. Mild scattered aortic atherosclerosis is seen. There is normal three-vessel brachiocephalic anatomy. Mediastinum/Nodes: Subcarinal adenopathy is seen measuring 2.2 cm in AP dimension. There is also a right hilar adenopathy which measures 1.5 cm in AP dimension. Thyroid gland, trachea, and esophagus demonstrate no significant findings. Lungs/Pleura: There is small peripheral patchy airspace opacity seen within the posterior right upper and lower lung. There is also tree-in-bud opacity seen in the periphery of the right lung base. There is a small right and trace left pleural effusion. Mildly increased interstitial thickening seen within both upper lungs. Upper Abdomen: No acute abnormalities present in the visualized portions of the upper abdomen. Musculoskeletal: No chest wall abnormality. No acute or significant osseous findings. Review of the MIP images confirms the above findings. IMPRESSION: No central, segmental, or subsegmental pulmonary embolism. Small to moderate  pericardial effusion Subcarinal and right hilar adenopathy which could be reactive. Patchy/ground-glass opacity in the posterior right lung which could be due to atelectasis and/or infectious etiology. Small right and trace left pleural effusion. Mildly increased interstitial thickening seen within both upper lungs likely due to mild interstitial edema. Aortic Atherosclerosis (ICD10-I70.0). Electronically Signed   By: Prudencio Pair M.D.   On: 04/16/2019  00:04   DG Chest Portable 1 View  Result Date: 04/16/2019 CLINICAL DATA:  Shortness of breath EXAM: PORTABLE CHEST 1 VIEW COMPARISON:  None. FINDINGS: The heart size and mediastinal contours are within normal limits. There is mildly increased interstitial markings seen throughout both lungs. There is patchy airspace opacity at the periphery of the right lung base. The visualized skeletal structures are unremarkable. IMPRESSION: Mildly increased interstitial markings throughout both lungs with patchy airspace opacity in the right lung base. This could be due to asymmetric edema and/or infectious etiology. Electronically Signed   By: Prudencio Pair M.D.   On: 04/16/2019 00:39    EKG: Independently reviewed.  Assessment/Plan Principal Problem:   Atrial fibrillation with RVR (HCC) Active Problems:   Lung cancer ([place])   New onset atrial fibrillation (HCC)   Hypotension    1. New onset A.Fib RVR - 1. Cardizem gtt, though BP 284 systolic and tachy to 132 on 5mg  cardizem currently. 2. Trop of 90 and repeat of 111, ? Demand ischemia 3. A.Fib pathway 4. Starting heparin gtt 5. Tele monitor 6. Titrate cardizem gtt as able 7. EDP calling cards for consult 2. Hypotension - 1. Cardiogenic vs sepsis. 2. Patient appears non toxic 3. ? Of PNA on CXR but no other obvious infectious source 4. Got 1L NS with EMS and another 1L bolus in ED.  Total IVF including ABx should be over the 232ml 5. Lactate nl, WBC nl, no fever 6. Not actually convinced that he is  septic at this point 7. BCx pending 8. Got cefepime, flagyl, vanc in ED, will leave him on these for the moment 9. procalcitonin pending 10. Hold all home BP meds 3. Lung CA - 1. Diagnosed on bronchoscopy last week 2. Presumably small overall tumor burden given lack of CT findings today.  Does have lymph nodes that are concerning given the bronch diagnosis though.  DVT prophylaxis: Heparin gtt Code Status: Full Family Communication: No family in room Disposition Plan: Home after admit Consults called: EDP calling cards now Admission status: Place in 52    Imani Sherrin, [place] [facility]  How to contact the [facility] Attending or Consulting provider [place] or covering provider during after hours [place], for this patient?  1. Check the care team in [facility] and look for a) attending/consulting [facility] provider listed and b) the [facility] team listed 2. Log into [email or web address]  Amion Physician Scheduling and messaging for groups and whole hospitals  On call and physician scheduling software for group practices, residents, hospitalists and other medical providers for call, clinic, rotation and shift schedules. On[employer] is a hospital-wide system for scheduling doctors and paging doctors on call. EasyPlot is for scientific plotting and data analysis.  [email or web address]  and use [place]'s universal password to access. If you do not have the password, please contact the hospital operator.  3. Locate the [facility] provider you are looking for under [facility] and page to a number that you can be directly reached. 4. If you still have difficulty reaching the provider, please page the [facility] (Director on Call) for the Hospitalists listed on amion for assistance.  04/16/2019, 2:27 AM

## 2019-04-17 ENCOUNTER — Inpatient Hospital Stay (HOSPITAL_COMMUNITY): Payer: Medicare Other

## 2019-04-17 LAB — URINE CULTURE: Culture: NO GROWTH

## 2019-04-17 LAB — PROCALCITONIN: Procalcitonin: <0.1 ng/mL

## 2019-04-17 LAB — GLUCOSE, CAPILLARY: Glucose-Capillary: 84 mg/dL (ref 70–99)

## 2019-04-17 MED ORDER — LEVOTHYROXINE SODIUM 75 MCG PO TABS
137.5000 ug | ORAL_TABLET | Freq: Every day | ORAL | Status: DC
  Administered 2019-04-20 – 2019-04-21 (×2): 137.5 ug via ORAL
  Filled 2019-04-17 (×4): qty 1

## 2019-04-17 NOTE — Progress Notes (Signed)
PHARMACY - PHYSICIAN COMMUNICATION CRITICAL VALUE ALERT - BLOOD CULTURE IDENTIFICATION (BCID)  Carlos Barrera is an 78 y.o. male who presented to [facility] on 04/15/2019 with a chief complaint of sepsis, aifb  Assessment:  1/4 blood culture bottles growing [place]  Name of physician (or Provider) Contacted: Clance Boll, NP  Current antibiotics: Cefepime and Vanc  Changes to prescribed antibiotics recommended:  Patient is on recommended antibiotics - No changes needed  No results found for this or any previous visit.  Sherlon Handing, PharmD, BCPS Please see amion for complete clinical pharmacist phone list 04/17/2019  2:09 AM

## 2019-04-17 NOTE — Progress Notes (Signed)
Patient refused all of his nighttime medications except for his IV antibiotic. He complained of nausea so I administered Zofran IV per prn orders. Rechecked pt in 1 hour and he said nausea was somewhat improved but he still did not want to take any of his oral medications as his stomach did not feel well enough at this time.

## 2019-04-17 NOTE — Progress Notes (Addendum)
PROGRESS NOTE    Carlos Barrera  NAT:[id number] DOB: 02/06/1941 DOA: 04/15/2019 PCP: [facility]   Brief Narrative: Carlos Barrera is a 78 y.o. male with medical history significant of HTN, prostate CA s/p treatment. Patient presented secondary to shortness of breath and found to have afib with RVR. Started on heparin drip and cardizem drip now transitioned to amiodarone and Eliquis. Complicated by hypotension.   Assessment & Plan:   Principal Problem:   Atrial fibrillation with RVR (HCC) Active Problems:   Lung cancer ([place])   New onset atrial fibrillation (HCC)   Hypotension   Atrial fibrillation with RVR New onset. Patient started on Diltiazem/heparin drip on admission. Cardiology consulted and transitioned to amiodarone PO and Eliquis. Transthoracic Echocardiogram significant for LA enlargement -Cardiology recommendations: amiodarone, Eliquis; pending today  Hypotension Etiology thought to be cardiogenic vs sepsis. Complicated by home lisinopril use. Patient received IV fluid resuscitation in the ED. Chest x-ray suggests edema vs infection. Normal WBC, afebrile. Urine culture with no growth. Blood cultures with 1/4 positive for GPC. Procalcitonin undetected. -Continue Vanc/Cefepime -Blood cultures -Repeat chest x-ray  Pericardia effusion Unknown etiology. Possibly contributing to dyspnea. -Cardiology recommendations  Hypothyroidism TSH of 26.3. Patient reports being very adherent with outpatient regimen. -Increase to Synthroid 137.5 mcg  Essential hypertension On lisinopril on prior to admission which is held secondary to hypotension. Blood pressure improved  Lung cancer Recently diagnosed. Outpatient follow-up.  Hyperlipidemia -Continue pravastatin   DVT prophylaxis: Eliquis Code Status:   Code Status: Full Code Family Communication: None Disposition Plan: Discharge pending blood cultures and cardiology recs   Consultants:    Cardiology  Procedures:   12/27: Transthoracic Echocardiogram IMPRESSIONS    1. Left ventricular ejection fraction, by visual estimation, is 60 to 65%. The left ventricle has normal function. There is no left ventricular hypertrophy.  2. The left ventricle has no regional wall motion abnormalities.  3. Global right ventricle has normal systolic function.The right ventricular size is normal. No increase in right ventricular wall thickness.  4. Left atrial size was mildly dilated.  5. Right atrial size was normal.  6. Moderate pericardial effusion.  7. The pericardial effusion is anterior to the right ventricle.  8. Mild mitral annular calcification.  9. The mitral valve is normal in structure. No evidence of mitral valve regurgitation. No evidence of mitral stenosis. 10. The tricuspid valve is normal in structure. 11. The aortic valve is normal in structure. Aortic valve regurgitation is not visualized. Mild aortic valve sclerosis without stenosis. 12. The pulmonic valve was normal in structure. Pulmonic valve regurgitation is not visualized. 13. The inferior vena cava is normal in size with greater than 50% respiratory variability, suggesting right atrial pressure of 3 mmHg. 14. No prior Echocardiogram.  Antimicrobials:  Vancomycin  Cefepime    Subjective: No concerns other than significant dyspnea on exertion. He actually states this has been progressive for the past few months. Significantly limited functionally at this time.  Objective: Vitals:   04/16/19 1955 04/17/19 0010 04/17/19 0550 04/17/19 0820  BP: (!) 103/58 (!) 90/54 (!) 100/50 137/72  Pulse: 77 66 63 64  Resp: 20 18 20  (!) 22  Temp: 97.9 F (36.6 C) 97.8 F (36.6 C) 98.2 F (36.8 C) 98.1 F (36.7 C)  TempSrc: Oral Oral Oral Oral  SpO2: 98%  97% 97%  Weight:      Height:        Intake/Output Summary (Last 24 hours) at 04/17/2019 4270 Last data filed  at 04/16/2019 1729 Gross per 24 hour  Intake  160 ml  Output 425 ml  Net -265 ml   Filed Weights   04/16/19 0444  Weight: 70.2 kg    Examination:  General exam: Appears calm and comfortable Respiratory system: Clear to auscultation. Respiratory effort normal. Cardiovascular system: S1 & S2 heard, RRR. No murmurs, rubs, gallops or clicks. Gastrointestinal system: Abdomen is nondistended, soft and nontender. No organomegaly or masses felt. Normal bowel sounds heard. Central nervous system: Alert and oriented. No focal neurological deficits. Extremities: No edema. No calf tenderness Skin: No cyanosis. No rashes Psychiatry: Judgement and insight appear normal. Mood & affect appropriate.     Data Reviewed: I have personally reviewed following labs and imaging studies  CBC: Recent Labs  Lab 04/15/19 2227 04/15/19 2230 04/16/19 0638  WBC 6.8  --  4.4  NEUTROABS 5.7  --   --   HGB 10.3* 9.9* 9.9*  HCT 32.1* 29.0* 30.9*  MCV 94.7  --  93.9  PLT 210  --  678   Basic Metabolic Panel: Recent Labs  Lab 04/15/19 2227 04/15/19 2230 04/16/19 0638  NA 139 138 138  K 3.8 3.8 4.0  CL 110 106 108  CO2 22  --  21*  GLUCOSE 130* 126* 161*  BUN 15 16 16   CREATININE 1.15 1.10 1.21  CALCIUM 9.2  --  9.6   GFR: Estimated Creatinine Clearance: 48.7 mL/min (by C-G formula based on SCr of 1.21 mg/dL). Liver Function Tests: Recent Labs  Lab 04/15/19 2242  AST 16  ALT 14  ALKPHOS 87  BILITOT 0.4  PROT 5.6*  ALBUMIN 2.7*   Recent Labs  Lab 04/15/19 2242  LIPASE 40   No results for input(s): AMMONIA in the last 168 hours. Coagulation Profile: Recent Labs  Lab 04/16/19 0043  INR 1.2   Cardiac Enzymes: No results for input(s): CKTOTAL, CKMB, CKMBINDEX, TROPONINI in the last 168 hours. BNP (last 3 results) No results for input(s): PROBNP in the last 8760 hours. HbA1C: No results for input(s): HGBA1C in the last 72 hours. CBG: No results for input(s): GLUCAP in the last 168 hours. Lipid Profile: No results for  input(s): CHOL, HDL, LDLCALC, TRIG, CHOLHDL, LDLDIRECT in the last 72 hours. Thyroid Function Tests: Recent Labs    04/16/19 0945  TSH 26.320*   Anemia Panel: No results for input(s): VITAMINB12, FOLATE, FERRITIN, TIBC, IRON, RETICCTPCT in the last 72 hours. Sepsis Labs: Recent Labs  Lab 04/15/19 2245 04/16/19 0045 04/16/19 0220 04/17/19 0308  PROCALCITON  --   --  <0.10 <0.10  LATICACIDVEN 1.7 1.3  --   --     Recent Results (from the past 240 hour(s))  Urine culture     Status: None   Collection Time: 04/15/19 11:17 PM   Specimen: Urine, Catheterized  Result Value Ref Range Status   Specimen Description URINE, CATHETERIZED  Final   Special Requests NONE  Final   Culture   Final    NO GROWTH Performed at [facility], [address] [place], [place] [address]    Report Status 04/17/2019 FINAL  Final  Blood culture (routine x 2)     Status: None (Preliminary result)   Collection Time: 04/16/19  1:10 AM   Specimen: BLOOD RIGHT ARM  Result Value Ref Range Status   Specimen Description BLOOD RIGHT ARM  Final   Special Requests   Final    BOTTLES DRAWN AEROBIC AND ANAEROBIC Blood Culture adequate volume  Culture  Setup Time   Final    AEROBIC BOTTLE ONLY GRAM POSITIVE COCCI CRITICAL RESULT CALLED TO, READ BACK BY AND VERIFIED WITH: KAREN AMEND @0206  ON 04/17/19 BY ROBINSON Z.  Performed at [facility], [place] [address], [place], [place] [address]    Culture PENDING  Incomplete   Report Status PENDING  Incomplete  Respiratory Panel by RT PCR (Flu A&B, Covid) - Nasopharyngeal Swab     Status: None   Collection Time: 04/16/19  2:01 AM   Specimen: Nasopharyngeal Swab  Result Value Ref Range Status   SARS Coronavirus 2 by RT PCR NEGATIVE NEGATIVE Final    Comment: (NOTE) SARS-CoV-2 target nucleic acids are NOT DETECTED. The SARS-CoV-2 RNA is generally detectable in upper respiratoy specimens during the acute phase of infection. The lowest concentration of  SARS-CoV-2 viral copies this assay can detect is 131 copies/mL. A negative result does not preclude SARS-Cov-2 infection and should not be used as the sole basis for treatment or other patient management decisions. A negative result may occur with  improper specimen collection/handling, submission of specimen other than nasopharyngeal swab, presence of viral mutation(s) within the areas targeted by this assay, and inadequate number of viral copies (<131 copies/mL). A negative result must be combined with clinical observations, patient history, and epidemiological information. The expected result is Negative. Fact Sheet for Patients:  PinkCheek.be Fact Sheet for Healthcare Providers:  GravelBags.it This test is not yet ap proved or cleared by the [place] FDA and  has been authorized for detection and/or diagnosis of SARS-CoV-2 by FDA under an Emergency Use Authorization (EUA). This EUA will remain  in effect (meaning this test can be used) for the duration of the COVID-19 declaration under Section 564(b)(1) of the Act, 21 U.S.C. section 360bbb-3(b)(1), unless the authorization is terminated or revoked sooner.    Influenza A by PCR NEGATIVE NEGATIVE Final   Influenza B by PCR NEGATIVE NEGATIVE Final    Comment: (NOTE) The Xpert Xpress SARS-CoV-2/FLU/RSV assay is intended as an aid in  the diagnosis of influenza from Nasopharyngeal swab specimens and  should not be used as a sole basis for treatment. Nasal washings and  aspirates are unacceptable for Xpert Xpress SARS-CoV-2/FLU/RSV  testing. Fact Sheet for Patients: PinkCheek.be Fact Sheet for Healthcare Providers: GravelBags.it This test is not yet approved or cleared by the [place] FDA and  has been authorized for detection and/or diagnosis of SARS-CoV-2 by  FDA under an Emergency Use Authorization (EUA).  This EUA will remain  in effect (meaning this test can be used) for the duration of the  Covid-19 declaration under Section 564(b)(1) of the Act, 21  U.S.C. section 360bbb-3(b)(1), unless the authorization is  terminated or revoked. Performed at [facility], [place] [address], [place], [place] [address]          Radiology Studies: CT Angio Chest PE W and/or Wo Contrast  Result Date: 04/16/2019 CLINICAL DATA:  Shortness of breath EXAM: CT ANGIOGRAPHY CHEST WITH CONTRAST TECHNIQUE: Multidetector CT imaging of the chest was performed using the standard protocol during bolus administration of intravenous contrast. Multiplanar CT image reconstructions and MIPs were obtained to evaluate the vascular anatomy. CONTRAST:  11mL OMNIPAQUE IOHEXOL 350 MG/ML SOLN COMPARISON:  None. FINDINGS: Cardiovascular: There is a optimal opacification of the pulmonary arteries. There is no central,segmental, or subsegmental filling defects within the pulmonary arteries. There is mild cardiomegaly. A small to moderate pericardial effusion is present. Mild scattered aortic atherosclerosis  is seen. There is normal three-vessel brachiocephalic anatomy. Mediastinum/Nodes: Subcarinal adenopathy is seen measuring 2.2 cm in AP dimension. There is also a right hilar adenopathy which measures 1.5 cm in AP dimension. Thyroid gland, trachea, and esophagus demonstrate no significant findings. Lungs/Pleura: There is small peripheral patchy airspace opacity seen within the posterior right upper and lower lung. There is also tree-in-bud opacity seen in the periphery of the right lung base. There is a small right and trace left pleural effusion. Mildly increased interstitial thickening seen within both upper lungs. Upper Abdomen: No acute abnormalities present in the visualized portions of the upper abdomen. Musculoskeletal: No chest wall abnormality. No acute or significant osseous findings. Review of the MIP images confirms the  above findings. IMPRESSION: No central, segmental, or subsegmental pulmonary embolism. Small to moderate pericardial effusion Subcarinal and right hilar adenopathy which could be reactive. Patchy/ground-glass opacity in the posterior right lung which could be due to atelectasis and/or infectious etiology. Small right and trace left pleural effusion. Mildly increased interstitial thickening seen within both upper lungs likely due to mild interstitial edema. Aortic Atherosclerosis (ICD10-I70.0). Electronically Signed   By: Prudencio Pair M.D.   On: 04/16/2019 00:04   DG Chest Portable 1 View  Result Date: 04/16/2019 CLINICAL DATA:  Shortness of breath EXAM: PORTABLE CHEST 1 VIEW COMPARISON:  None. FINDINGS: The heart size and mediastinal contours are within normal limits. There is mildly increased interstitial markings seen throughout both lungs. There is patchy airspace opacity at the periphery of the right lung base. The visualized skeletal structures are unremarkable. IMPRESSION: Mildly increased interstitial markings throughout both lungs with patchy airspace opacity in the right lung base. This could be due to asymmetric edema and/or infectious etiology. Electronically Signed   By: Prudencio Pair M.D.   On: 04/16/2019 00:39   ECHOCARDIOGRAM COMPLETE  Result Date: 04/16/2019   ECHOCARDIOGRAM REPORT   Patient Name:   Carlos Barrera Date of Exam: 04/16/2019 Medical Rec #:  [id number]       Height:       68.0 in Accession #:    [id number]      Weight:       154.8 lb Date of Birth:  10/07/1940       BSA:          1.83 m Patient Age:    65 years        BP:           105/54 mmHg Patient Gender: M               HR:           74 bpm. Exam Location:  Inpatient Procedure: 2D Echo, Color Doppler and Cardiac Doppler Indications:    Atrial fibrillation  History:        Patient has no prior history of Echocardiogram examinations.                 Lung and prostate cancer.  Sonographer:    Merrie Roof RDCS Referring Phys:  [place]  1. Left ventricular ejection fraction, by visual estimation, is 60 to 65%. The left ventricle has normal function. There is no left ventricular hypertrophy.  2. The left ventricle has no regional wall motion abnormalities.  3. Global right ventricle has normal systolic function.The right ventricular size is normal. No increase in right ventricular wall thickness.  4. Left atrial size was mildly dilated.  5. Right atrial size was normal.  6.  Moderate pericardial effusion.  7. The pericardial effusion is anterior to the right ventricle.  8. Mild mitral annular calcification.  9. The mitral valve is normal in structure. No evidence of mitral valve regurgitation. No evidence of mitral stenosis. 10. The tricuspid valve is normal in structure. 11. The aortic valve is normal in structure. Aortic valve regurgitation is not visualized. Mild aortic valve sclerosis without stenosis. 12. The pulmonic valve was normal in structure. Pulmonic valve regurgitation is not visualized. 13. The inferior vena cava is normal in size with greater than 50% respiratory variability, suggesting right atrial pressure of 3 mmHg. 14. No prior Echocardiogram. FINDINGS  Left Ventricle: Left ventricular ejection fraction, by visual estimation, is 60 to 65%. The left ventricle has normal function. The left ventricle has no regional wall motion abnormalities. There is no left ventricular hypertrophy. Left ventricular diastolic parameters were normal. Normal left atrial pressure. Right Ventricle: The right ventricular size is normal. No increase in right ventricular wall thickness. Global RV systolic function is has normal systolic function. Left Atrium: Left atrial size was mildly dilated. Right Atrium: Right atrial size was normal in size Pericardium: A moderately sized pericardial effusion is present. The pericardial effusion is anterior to the right ventricle. There is no evidence of cardiac tamponade.  Mitral Valve: The mitral valve is normal in structure. Mild mitral annular calcification. No evidence of mitral valve regurgitation. No evidence of mitral valve stenosis by observation. Tricuspid Valve: The tricuspid valve is normal in structure. Tricuspid valve regurgitation is not demonstrated. Aortic Valve: The aortic valve is normal in structure. Aortic valve regurgitation is not visualized. Mild aortic valve sclerosis is present, with no evidence of aortic valve stenosis. Pulmonic Valve: The pulmonic valve was normal in structure. Pulmonic valve regurgitation is not visualized. Pulmonic regurgitation is not visualized. Aorta: The aortic root, ascending aorta and aortic arch are all structurally normal, with no evidence of dilitation or obstruction. Venous: The inferior vena cava was not well visualized. The inferior vena cava is normal in size with greater than 50% respiratory variability, suggesting right atrial pressure of 3 mmHg. IAS/Shunts: No atrial level shunt detected by color flow Doppler. There is no evidence of a patent foramen ovale. No ventricular septal defect is seen or detected. There is no evidence of an atrial septal defect.  LEFT VENTRICLE PLAX 2D LVIDd:         4.35 cm       Diastology LVIDs:         2.82 cm       LV e' lateral:   7.83 cm/s LV PW:         1.13 cm       LV E/e' lateral: 8.7 LV IVS:        1.04 cm       LV e' medial:    8.38 cm/s LVOT diam:     1.90 cm       LV E/e' medial:  8.1 LV SV:         55 ml LV SV Index:   30.05 LVOT Area:     2.84 cm  LV Volumes (MOD) LV area d, A4C:    24.20 cm LV area s, A4C:    12.20 cm LV major d, A4C:   7.25 cm LV major s, A4C:   5.94 cm LV vol d, MOD A4C: 66.1 ml LV vol s, MOD A4C: 20.9 ml LV SV MOD A4C:     66.1 ml RIGHT VENTRICLE RV  Basal diam:  3.54 cm LEFT ATRIUM             Index       RIGHT ATRIUM           Index LA diam:        4.20 cm 2.29 cm/m  RA Area:     12.80 cm LA Vol (A2C):   42.1 ml 22.97 ml/m RA Volume:   27.60 ml  15.06  ml/m LA Vol (A4C):   51.6 ml 28.15 ml/m LA Biplane Vol: 50.0 ml 27.28 ml/m  AORTIC VALVE LVOT Vmax:   71.10 cm/s LVOT Vmean:  47.800 cm/s LVOT VTI:    0.138 m  AORTA Ao Root diam: 3.60 cm Ao Asc diam:  3.60 cm MITRAL VALVE MV Area (PHT): 4.29 cm             SHUNTS MV PHT:        51.33 msec           Systemic VTI:  0.14 m MV Decel Time: 177 msec             Systemic Diam: 1.90 cm MV E velocity: 68.10 cm/s 103 cm/s MV A velocity: 56.10 cm/s 70.3 cm/s MV E/A ratio:  1.21       1.5  Mihai Croitoru MD Electronically signed by Sanda Klein MD Signature Date/Time: 04/16/2019/5:15:08 PM    Final         Scheduled Meds: . amiodarone  200 mg Oral BID  . apixaban  5 mg Oral BID  . calcium-vitamin D  1 tablet Oral BID WC  . [START ON 04/18/2019] levothyroxine  137.5 mcg Oral Q0600  . loratadine  10 mg Oral Daily  . pantoprazole  40 mg Oral BID  . polyvinyl alcohol  1 drop Both Eyes BID  . pravastatin  20 mg Oral QHS  . sertraline  50 mg Oral QHS  . traZODone  50 mg Oral QHS   Continuous Infusions: . ceFEPime (MAXIPIME) IV 2 g (04/16/19 2144)  . vancomycin 1,250 mg (04/16/19 1729)     LOS: 1 day     Cordelia Poche, MD [facility] 04/17/2019, 9:27 AM  If 7PM-7AM, please contact night-coverage [email or web address]
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
## 2019-04-18 ENCOUNTER — Encounter (HOSPITAL_COMMUNITY): Payer: Self-pay | Admitting: Internal Medicine

## 2019-04-18 ENCOUNTER — Telehealth: Payer: Self-pay | Admitting: Orthopedic Surgery

## 2019-04-18 ENCOUNTER — Other Ambulatory Visit: Payer: Self-pay

## 2019-04-18 LAB — CBC
HCT: 32 % — ABNORMAL LOW (ref 39.0–52.0)
Hemoglobin: 10.4 g/dL — ABNORMAL LOW (ref 13.0–17.0)
MCH: 30.1 pg (ref 26.0–34.0)
MCHC: 32.5 g/dL (ref 30.0–36.0)
MCV: 92.8 fL (ref 80.0–100.0)
Platelets: 237 10*3/uL (ref 150–400)
RBC: 3.45 MIL/uL — ABNORMAL LOW (ref 4.22–5.81)
RDW: 14.8 % (ref 11.5–15.5)
WBC: 5 10*3/uL (ref 4.0–10.5)
nRBC: 0 % (ref 0.0–0.2)

## 2019-04-18 LAB — CULTURE, BLOOD (ROUTINE X 2): Special Requests: ADEQUATE

## 2019-04-18 LAB — IRON AND TIBC
Iron: 42 ug/dL — ABNORMAL LOW (ref 45–182)
Saturation Ratios: 21 % (ref 17.9–39.5)
TIBC: 199 ug/dL — ABNORMAL LOW (ref 250–450)
UIBC: 157 ug/dL

## 2019-04-18 LAB — BASIC METABOLIC PANEL
Anion gap: 8 (ref 5–15)
BUN: 13 mg/dL (ref 8–23)
CO2: 22 mmol/L (ref 22–32)
Calcium: 10 mg/dL (ref 8.9–10.3)
Chloride: 105 mmol/L (ref 98–111)
Creatinine, Ser: 1.11 mg/dL (ref 0.61–1.24)
GFR calc Af Amer: >60 mL/min (ref >60)
GFR calc non Af Amer: >60 mL/min (ref >60)
Glucose, Bld: 97 mg/dL (ref 70–99)
Potassium: 4.2 mmol/L (ref 3.5–5.1)
Sodium: 135 mmol/L (ref 135–145)

## 2019-04-18 LAB — FERRITIN: Ferritin: 235 ng/mL (ref 24–336)

## 2019-04-18 LAB — PROCALCITONIN: Procalcitonin: <0.1 ng/mL

## 2019-04-18 MED ORDER — CALCIUM CARBONATE ANTACID 500 MG PO CHEW: 1.0000 | CHEWABLE_TABLET | Freq: Three times a day (TID) | ORAL | Status: DC | PRN

## 2019-04-18 NOTE — Discharge Instructions (Signed)
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
## 2019-04-18 NOTE — Telephone Encounter (Signed)
Sonia Baller from [facility] PT called requesting verbal orders to move the patient's pt discharge visit to next week 1/4.  Call back number: [phone]
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
## 2019-04-18 NOTE — Telephone Encounter (Signed)
IC no answer. LMVM advising ok for this.

## 2019-04-18 NOTE — TOC Initial Note (Signed)
Transition of Care [facility]) - Initial/Assessment Note    Patient Details  Name: Carlos Barrera MRN: [id number] Date of Birth: [birth year]  Transition of Care [facility]) CM/SW Contact:    Marilu Favre, RN Phone Number: 04/18/2019, 2:33 PM  Clinical Narrative:                  Patient from home alone. Discussed home health PT. At this time patient does not feel that he needs home health PT.   Patient has a walker at home already.  Expected Discharge Plan: Home/Self Care Barriers to Discharge: Continued Medical Work up   Patient Goals and CMS Choice Patient states their goals for this hospitalization and ongoing recovery are:: to go home CMS Medicare.gov Compare Post Acute Care list provided to:: Patient    Expected Discharge Plan and Services Expected Discharge Plan: Home/Self Care   Discharge Planning Services: CM Consult   Living arrangements for the past 2 months: Single Family Home                 DME Arranged: N/A         HH Arranged: PT, Refused HH          Prior Living Arrangements/Services Living arrangements for the past 2 months: Single Family Home Lives with:: Self Patient language and need for interpreter reviewed:: Yes            Current home services: DME Criminal Activity/Legal Involvement Pertinent to Current Situation/Hospitalization: No - Comment as needed  Activities of Daily Living Home Assistive Devices/Equipment: None ADL Screening (condition at time of admission) Patient's cognitive ability adequate to safely complete daily activities?: Yes Is the patient deaf or have difficulty hearing?: No Does the patient have difficulty seeing, even when wearing glasses/contacts?: No Does the patient have difficulty concentrating, remembering, or making decisions?: No Patient able to express need for assistance with ADLs?: Yes Does the patient have difficulty dressing or bathing?: No Independently performs ADLs?: Yes (appropriate for developmental  age) Does the patient have difficulty walking or climbing stairs?: No Weakness of Legs: None Weakness of Arms/Hands: None  Permission Sought/Granted                  Emotional Assessment       Orientation: : Oriented to Self, Oriented to Place, Oriented to  Time, Oriented to Situation      Admission diagnosis:  Prostate cancer ([place]) [C61] New onset atrial fibrillation ([place]) [I48.91] Atrial fibrillation with rapid ventricular response ([place]) [I48.91] Atrial fibrillation with RVR ([place]) [I48.91] Patient Active Problem List   Diagnosis Date Noted  . Lung cancer ([place]) 04/16/2019  . New onset atrial fibrillation ([place]) 04/16/2019  . Atrial fibrillation with RVR ([place]) 04/16/2019  . Hypotension 04/16/2019  . Arthritis of left knee 03/09/2019   PCP:  Clinic, [place]:   [place], [place] - [id number] [place] [address] Phone: [phone] Fax: [phone]  [id number] [address] [place] [address] Phone: [phone] Fax: [phone]     Social Determinants of Health (SDOH) Interventions    Readmission Risk Interventions No flowsheet data found.

## 2019-04-18 NOTE — Progress Notes (Addendum)
Pt scheduled for PET scan tomorrow at [facility]. Imaging supervisor at [facility] has given permission for pt to get his scan done while currently being inpatient at [facility]. [facility] has been arranged to pick pt up at 10:30 am from [facility] and transport pt to [facility] for imaging to be done. Pt arranged to be picked up from [facility] after scan and brought back to [facility]. Medical Necessity form has been filled out. [facility] and [facility] imaging aware of pt's plan. Pt to be NPO after midnight tonight.

## 2019-04-18 NOTE — Progress Notes (Signed)
PROGRESS NOTE    [name]  KWI:[id number] DOB: 1940-12-30 DOA: 04/15/2019 PCP: [facility]   Brief Narrative: Carlos Barrera is a 78 y.o. male with medical history significant of HTN, prostate CA s/p treatment. Patient presented secondary to shortness of breath and found to have afib with RVR. Started on heparin drip and cardizem drip now transitioned to amiodarone and Eliquis. Complicated by hypotension.   Assessment & Plan:   Principal Problem:   Atrial fibrillation with RVR (HCC) Active Problems:   Lung cancer ([place])   New onset atrial fibrillation (HCC)   Hypotension   Atrial fibrillation with RVR New onset. Patient started on Diltiazem/heparin drip on admission. Associated mildly elevated troponin. Cardiology consulted and transitioned to amiodarone PO and Eliquis. Transthoracic Echocardiogram significant for LA enlargement -Cardiology recommendations: amiodarone, Eliquis  Hypotension Etiology thought to be cardiogenic vs sepsis. Complicated by home lisinopril use. Patient received IV fluid resuscitation in the ED. Chest x-ray suggests edema vs infection. Normal WBC, afebrile. Urine culture with no growth. Blood cultures with 1/4 positive for GPC. Procalcitonin undetected.  -Continue Vanc/Cefepime until identification on blood culture is available  Pericardia effusion Unknown etiology but in setting of lung cancer. Possibly contributing to dyspnea. -Cardiology recommendations  Dyspnea on exertion Unknown etiology. Has been progressive for the past several months -PT eval  Hypothyroidism TSH of 26.3. Patient reports being very adherent with outpatient regimen. -Continued increased dose of Synthroid 137.5 mcg  Essential hypertension On lisinopril on prior to admission which is held secondary to hypotension. Blood pressure improved  Lung cancer Recently diagnosed. Patient follows with [facility]. Plan for outpatient PET scan that is schedule for  12/30.  Anemia Normocytic anemia. In setting of underlying cancer.  -Iron/TIBC/Ferritin  Hyperlipidemia -Continue pravastatin   DVT prophylaxis: Eliquis Code Status:   Code Status: Full Code Family Communication: None Disposition Plan: Discharge pending blood cultures and cardiology recs   Consultants:   Cardiology  Procedures:   12/27: Transthoracic Echocardiogram IMPRESSIONS    1. Left ventricular ejection fraction, by visual estimation, is 60 to 65%. The left ventricle has normal function. There is no left ventricular hypertrophy.  2. The left ventricle has no regional wall motion abnormalities.  3. Global right ventricle has normal systolic function.The right ventricular size is normal. No increase in right ventricular wall thickness.  4. Left atrial size was mildly dilated.  5. Right atrial size was normal.  6. Moderate pericardial effusion.  7. The pericardial effusion is anterior to the right ventricle.  8. Mild mitral annular calcification.  9. The mitral valve is normal in structure. No evidence of mitral valve regurgitation. No evidence of mitral stenosis. 10. The tricuspid valve is normal in structure. 11. The aortic valve is normal in structure. Aortic valve regurgitation is not visualized. Mild aortic valve sclerosis without stenosis. 12. The pulmonic valve was normal in structure. Pulmonic valve regurgitation is not visualized. 13. The inferior vena cava is normal in size with greater than 50% respiratory variability, suggesting right atrial pressure of 3 mmHg. 14. No prior Echocardiogram.  Antimicrobials:  Vancomycin  Cefepime    Subjective: Still with dyspnea on exertion  Objective: Vitals:   04/17/19 2329 04/18/19 0442 04/18/19 0735 04/18/19 1210  BP: (!) 118/55 (!) 113/52 (!) 109/57 123/70  Pulse: 73 64 77 63  Resp: 18 14 20 18   Temp: 98.4 F (36.9 C) 98 F (36.7 C) 98.2 F (36.8 C) 98 F (36.7 C)  TempSrc: Oral Oral Oral Oral  SpO2:  97% 98% 97% 97%  Weight:      Height:        Intake/Output Summary (Last 24 hours) at 04/18/2019 1221 Last data filed at 04/18/2019 1200 Gross per 24 hour  Intake 700 ml  Output 1000 ml  Net -300 ml   Filed Weights   04/16/19 0444  Weight: 70.2 kg    Examination:  General exam: Appears calm and comfortable Respiratory system: Clear to auscultation. Respiratory effort normal. Cardiovascular system: S1 & S2 heard, RRR. No murmurs, rubs, gallops or clicks. Gastrointestinal system: Abdomen is nondistended, soft and nontender. No organomegaly or masses felt. Normal bowel sounds heard. Central nervous system: Alert and oriented. No focal neurological deficits. Extremities: No edema. No calf tenderness Skin: No cyanosis. No rashes Psychiatry: Judgement and insight appear normal. Mood & affect appropriate.     Data Reviewed: I have personally reviewed following labs and imaging studies  CBC: Recent Labs  Lab 04/15/19 2227 04/15/19 2230 04/16/19 0638 04/18/19 1043  WBC 6.8  --  4.4 5.0  NEUTROABS 5.7  --   --   --   HGB 10.3* 9.9* 9.9* 10.4*  HCT 32.1* 29.0* 30.9* 32.0*  MCV 94.7  --  93.9 92.8  PLT 210  --  204 998   Basic Metabolic Panel: Recent Labs  Lab 04/15/19 2227 04/15/19 2230 04/16/19 0638 04/18/19 0323  NA 139 138 138 135  K 3.8 3.8 4.0 4.2  CL 110 106 108 105  CO2 22  --  21* 22  GLUCOSE 130* 126* 161* 97  BUN 15 16 16 13   CREATININE 1.15 1.10 1.21 1.11  CALCIUM 9.2  --  9.6 10.0   GFR: Estimated Creatinine Clearance: 53.1 mL/min (by C-G formula based on SCr of 1.11 mg/dL). Liver Function Tests: Recent Labs  Lab 04/15/19 2242  AST 16  ALT 14  ALKPHOS 87  BILITOT 0.4  PROT 5.6*  ALBUMIN 2.7*   Recent Labs  Lab 04/15/19 2242  LIPASE 40   No results for input(s): AMMONIA in the last 168 hours. Coagulation Profile: Recent Labs  Lab 04/16/19 0043  INR 1.2   Cardiac Enzymes: No results for input(s): CKTOTAL, CKMB, CKMBINDEX,  TROPONINI in the last 168 hours. BNP (last 3 results) No results for input(s): PROBNP in the last 8760 hours. HbA1C: No results for input(s): HGBA1C in the last 72 hours. CBG: Recent Labs  Lab 04/17/19 1544  GLUCAP 84   Lipid Profile: No results for input(s): CHOL, HDL, LDLCALC, TRIG, CHOLHDL, LDLDIRECT in the last 72 hours. Thyroid Function Tests: Recent Labs    04/16/19 0945  TSH 26.320*   Anemia Panel: No results for input(s): VITAMINB12, FOLATE, FERRITIN, TIBC, IRON, RETICCTPCT in the last 72 hours. Sepsis Labs: Recent Labs  Lab 04/15/19 2245 04/16/19 0045 04/16/19 0220 04/17/19 0308 04/18/19 0323  PROCALCITON  --   --  <0.10 <0.10 <0.10  LATICACIDVEN 1.7 1.3  --   --   --     Recent Results (from the past 240 hour(s))  Urine culture     Status: None   Collection Time: 04/15/19 11:17 PM   Specimen: Urine, Catheterized  Result Value Ref Range Status   Specimen Description URINE, CATHETERIZED  Final   Special Requests NONE  Final   Culture   Final    NO GROWTH Performed at [facility], [address] [place], [place] [address]    Report Status 04/17/2019 FINAL  Final  Blood culture (routine  x 2)     Status: None (Preliminary result)   Collection Time: 04/16/19  1:10 AM   Specimen: BLOOD RIGHT ARM  Result Value Ref Range Status   Specimen Description BLOOD RIGHT ARM  Final   Special Requests   Final    BOTTLES DRAWN AEROBIC AND ANAEROBIC Blood Culture adequate volume   Culture  Setup Time   Final    AEROBIC BOTTLE ONLY GRAM POSITIVE COCCI CRITICAL RESULT CALLED TO, READ BACK BY AND VERIFIED WITH: KAREN AMEND @0206  ON 04/17/19 BY ROBINSON Z.     Culture   Final    GRAM POSITIVE COCCI IDENTIFICATION TO FOLLOW Performed at [facility], [place] [address], [place], [place] [address]    Report Status PENDING  Incomplete  Respiratory Panel by RT PCR (Flu A&B, Covid) - Nasopharyngeal Swab     Status: None   Collection Time: 04/16/19  2:01 AM    Specimen: Nasopharyngeal Swab  Result Value Ref Range Status   SARS Coronavirus 2 by RT PCR NEGATIVE NEGATIVE Final    Comment: (NOTE) SARS-CoV-2 target nucleic acids are NOT DETECTED. The SARS-CoV-2 RNA is generally detectable in upper respiratoy specimens during the acute phase of infection. The lowest concentration of SARS-CoV-2 viral copies this assay can detect is 131 copies/mL. A negative result does not preclude SARS-Cov-2 infection and should not be used as the sole basis for treatment or other patient management decisions. A negative result may occur with  improper specimen collection/handling, submission of specimen other than nasopharyngeal swab, presence of viral mutation(s) within the areas targeted by this assay, and inadequate number of viral copies (<131 copies/mL). A negative result must be combined with clinical observations, patient history, and epidemiological information. The expected result is Negative. Fact Sheet for Patients:  PinkCheek.be Fact Sheet for Healthcare Providers:  GravelBags.it This test is not yet ap proved or cleared by the [place] FDA and  has been authorized for detection and/or diagnosis of SARS-CoV-2 by FDA under an Emergency Use Authorization (EUA). This EUA will remain  in effect (meaning this test can be used) for the duration of the COVID-19 declaration under Section 564(b)(1) of the Act, 21 U.S.C. section 360bbb-3(b)(1), unless the authorization is terminated or revoked sooner.    Influenza A by PCR NEGATIVE NEGATIVE Final   Influenza B by PCR NEGATIVE NEGATIVE Final    Comment: (NOTE) The Xpert Xpress SARS-CoV-2/FLU/RSV assay is intended as an aid in  the diagnosis of influenza from Nasopharyngeal swab specimens and  should not be used as a sole basis for treatment. Nasal washings and  aspirates are unacceptable for Xpert Xpress SARS-CoV-2/FLU/RSV  testing. Fact Sheet  for Patients: PinkCheek.be Fact Sheet for Healthcare Providers: GravelBags.it This test is not yet approved or cleared by the [place] FDA and  has been authorized for detection and/or diagnosis of SARS-CoV-2 by  FDA under an Emergency Use Authorization (EUA). This EUA will remain  in effect (meaning this test can be used) for the duration of the  Covid-19 declaration under Section 564(b)(1) of the Act, 21  U.S.C. section 360bbb-3(b)(1), unless the authorization is  terminated or revoked. Performed at [facility], [place] [address], [place], [place] [address]   Blood culture (routine x 2)     Status: None (Preliminary result)   Collection Time: 04/16/19  3:14 AM   Specimen: BLOOD  Result Value Ref Range Status   Specimen Description BLOOD LEFT ANTECUBITAL  Final   Special Requests  Final    BOTTLES DRAWN AEROBIC AND ANAEROBIC Blood Culture adequate volume   Culture   Final    NO GROWTH 2 DAYS Performed at [facility], [place] [address] [place], [place] [address]    Report Status PENDING  Incomplete         Radiology Studies: DG CHEST PORT 1 VIEW  Result Date: 04/17/2019 CLINICAL DATA:  Atrial fibrillation and shortness of breath. Evaluate for pulmonary infiltrates. EXAM: PORTABLE CHEST 1 VIEW COMPARISON:  04/16/2019 FINDINGS: Heart size appears within normal limits. New small right pleural effusion identified. Mild asymmetric edema identified within the right mid and right lower lung. Left lung is clear. IMPRESSION: 1. Small right effusion with mild asymmetric edema in the right mid and right lower lung. Electronically Signed   By: Kerby Moors M.D.   On: 04/17/2019 09:27   ECHOCARDIOGRAM COMPLETE  Result Date: 04/16/2019   ECHOCARDIOGRAM REPORT   Patient Name:   Carlos Barrera Date of Exam: 04/16/2019 Medical Rec #:  [id number]       Height:       68.0 in Accession #:    [id number]      Weight:        154.8 lb Date of Birth:  1941/02/06       BSA:          1.83 m Patient Age:    61 years        BP:           105/54 mmHg Patient Gender: M               HR:           74 bpm. Exam Location:  Inpatient Procedure: 2D Echo, Color Doppler and Cardiac Doppler Indications:    Atrial fibrillation  History:        Patient has no prior history of Echocardiogram examinations.                 Lung and prostate cancer.  Sonographer:    Merrie Roof RDCS Referring Phys: [place]  1. Left ventricular ejection fraction, by visual estimation, is 60 to 65%. The left ventricle has normal function. There is no left ventricular hypertrophy.  2. The left ventricle has no regional wall motion abnormalities.  3. Global right ventricle has normal systolic function.The right ventricular size is normal. No increase in right ventricular wall thickness.  4. Left atrial size was mildly dilated.  5. Right atrial size was normal.  6. Moderate pericardial effusion.  7. The pericardial effusion is anterior to the right ventricle.  8. Mild mitral annular calcification.  9. The mitral valve is normal in structure. No evidence of mitral valve regurgitation. No evidence of mitral stenosis. 10. The tricuspid valve is normal in structure. 11. The aortic valve is normal in structure. Aortic valve regurgitation is not visualized. Mild aortic valve sclerosis without stenosis. 12. The pulmonic valve was normal in structure. Pulmonic valve regurgitation is not visualized. 13. The inferior vena cava is normal in size with greater than 50% respiratory variability, suggesting right atrial pressure of 3 mmHg. 14. No prior Echocardiogram. FINDINGS  Left Ventricle: Left ventricular ejection fraction, by visual estimation, is 60 to 65%. The left ventricle has normal function. The left ventricle has no regional wall motion abnormalities. There is no left ventricular hypertrophy. Left ventricular diastolic parameters were normal. Normal  left atrial pressure. Right Ventricle: The right ventricular size is normal.  No increase in right ventricular wall thickness. Global RV systolic function is has normal systolic function. Left Atrium: Left atrial size was mildly dilated. Right Atrium: Right atrial size was normal in size Pericardium: A moderately sized pericardial effusion is present. The pericardial effusion is anterior to the right ventricle. There is no evidence of cardiac tamponade. Mitral Valve: The mitral valve is normal in structure. Mild mitral annular calcification. No evidence of mitral valve regurgitation. No evidence of mitral valve stenosis by observation. Tricuspid Valve: The tricuspid valve is normal in structure. Tricuspid valve regurgitation is not demonstrated. Aortic Valve: The aortic valve is normal in structure. Aortic valve regurgitation is not visualized. Mild aortic valve sclerosis is present, with no evidence of aortic valve stenosis. Pulmonic Valve: The pulmonic valve was normal in structure. Pulmonic valve regurgitation is not visualized. Pulmonic regurgitation is not visualized. Aorta: The aortic root, ascending aorta and aortic arch are all structurally normal, with no evidence of dilitation or obstruction. Venous: The inferior vena cava was not well visualized. The inferior vena cava is normal in size with greater than 50% respiratory variability, suggesting right atrial pressure of 3 mmHg. IAS/Shunts: No atrial level shunt detected by color flow Doppler. There is no evidence of a patent foramen ovale. No ventricular septal defect is seen or detected. There is no evidence of an atrial septal defect.  LEFT VENTRICLE PLAX 2D LVIDd:         4.35 cm       Diastology LVIDs:         2.82 cm       LV e' lateral:   7.83 cm/s LV PW:         1.13 cm       LV E/e' lateral: 8.7 LV IVS:        1.04 cm       LV e' medial:    8.38 cm/s LVOT diam:     1.90 cm       LV E/e' medial:  8.1 LV SV:         55 ml LV SV Index:   30.05 LVOT  Area:     2.84 cm  LV Volumes (MOD) LV area d, A4C:    24.20 cm LV area s, A4C:    12.20 cm LV major d, A4C:   7.25 cm LV major s, A4C:   5.94 cm LV vol d, MOD A4C: 66.1 ml LV vol s, MOD A4C: 20.9 ml LV SV MOD A4C:     66.1 ml RIGHT VENTRICLE RV Basal diam:  3.54 cm LEFT ATRIUM             Index       RIGHT ATRIUM           Index LA diam:        4.20 cm 2.29 cm/m  RA Area:     12.80 cm LA Vol (A2C):   42.1 ml 22.97 ml/m RA Volume:   27.60 ml  15.06 ml/m LA Vol (A4C):   51.6 ml 28.15 ml/m LA Biplane Vol: 50.0 ml 27.28 ml/m  AORTIC VALVE LVOT Vmax:   71.10 cm/s LVOT Vmean:  47.800 cm/s LVOT VTI:    0.138 m  AORTA Ao Root diam: 3.60 cm Ao Asc diam:  3.60 cm MITRAL VALVE MV Area (PHT): 4.29 cm             SHUNTS MV PHT:        51.33 msec  Systemic VTI:  0.14 m MV Decel Time: 177 msec             Systemic Diam: 1.90 cm MV E velocity: 68.10 cm/s 103 cm/s MV A velocity: 56.10 cm/s 70.3 cm/s MV E/A ratio:  1.21       1.5  Mihai Croitoru MD Electronically signed by Sanda Klein MD Signature Date/Time: 04/16/2019/5:15:08 PM    Final         Scheduled Meds: . amiodarone  200 mg Oral BID  . apixaban  5 mg Oral BID  . calcium-vitamin D  1 tablet Oral BID WC  . levothyroxine  137.5 mcg Oral Q0600  . loratadine  10 mg Oral Daily  . pantoprazole  40 mg Oral BID  . polyvinyl alcohol  1 drop Both Eyes BID  . pravastatin  20 mg Oral QHS  . sertraline  50 mg Oral QHS  . traZODone  50 mg Oral QHS   Continuous Infusions: . ceFEPime (MAXIPIME) IV 2 g (04/18/19 1045)  . vancomycin 1,250 mg (04/17/19 1208)     LOS: 2 days     Cordelia Poche, MD [facility] 04/18/2019, 12:21 PM  If 7PM-7AM, please contact night-coverage [email or web address]

## 2019-04-19 ENCOUNTER — Ambulatory Visit: Admission: RE | Admit: 2019-04-19 | Payer: Medicare Other | Source: Ambulatory Visit

## 2019-04-19 ENCOUNTER — Encounter: Payer: Self-pay | Admitting: *Deleted

## 2019-04-19 DIAGNOSIS — I959 Hypotension, unspecified: Secondary | ICD-10-CM

## 2019-04-19 DIAGNOSIS — R0600 Dyspnea, unspecified: Secondary | ICD-10-CM

## 2019-04-19 LAB — BASIC METABOLIC PANEL
Anion gap: 8 (ref 5–15)
BUN: 14 mg/dL (ref 8–23)
CO2: 21 mmol/L — ABNORMAL LOW (ref 22–32)
Calcium: 9.7 mg/dL (ref 8.9–10.3)
Chloride: 105 mmol/L (ref 98–111)
Creatinine, Ser: 1.15 mg/dL (ref 0.61–1.24)
GFR calc Af Amer: >60 mL/min (ref >60)
GFR calc non Af Amer: >60 mL/min (ref >60)
Glucose, Bld: 98 mg/dL (ref 70–99)
Potassium: 4 mmol/L (ref 3.5–5.1)
Sodium: 134 mmol/L — ABNORMAL LOW (ref 135–145)

## 2019-04-19 LAB — CBC
HCT: 32.9 % — ABNORMAL LOW (ref 39.0–52.0)
Hemoglobin: 10.8 g/dL — ABNORMAL LOW (ref 13.0–17.0)
MCH: 30.7 pg (ref 26.0–34.0)
MCHC: 32.8 g/dL (ref 30.0–36.0)
MCV: 93.5 fL (ref 80.0–100.0)
Platelets: 259 10*3/uL (ref 150–400)
RBC: 3.52 MIL/uL — ABNORMAL LOW (ref 4.22–5.81)
RDW: 15 % (ref 11.5–15.5)
WBC: 5.3 10*3/uL (ref 4.0–10.5)
nRBC: 0 % (ref 0.0–0.2)

## 2019-04-19 MED ORDER — POLYETHYLENE GLYCOL 3350 17 G PO PACK
17.0000 g | PACK | Freq: Every day | ORAL | Status: DC
  Administered 2019-04-19 – 2019-04-20 (×2): 17 g via ORAL
  Filled 2019-04-19 (×3): qty 1

## 2019-04-19 NOTE — Care Management (Signed)
Per Electa Sniff W/[facility] Co-pay amount for Eliquis 5 mg bid for 30 day supply Retail pharmacy $33.00,Mail order pharmacy for a 90 day supply $29.00.  No PA required Deductible not met. Tier medication ?  Network Pharmacies are : Nurse, learning disability ,Scientist, research (physical sciences). Mail order [facility].

## 2019-04-19 NOTE — Progress Notes (Signed)
PROGRESS NOTE    YON SCHIFFMAN  IZT:[id number] DOB: Sep 02, 1940 DOA: 04/15/2019 PCP: [facility]   Brief Narrative: GEN CLAGG is a 78 y.o. male with medical history significant of HTN, prostate CA s/p treatment presented to the hospital due to shortness of breath and found to have afib with RVR. Started on heparin drip and cardizem drip and was seen by cardiology. Patient was then transitioned to amiodarone and Eliquis. Complicated by hypotension on arrival.   Assessment & Plan:   Principal Problem:   Atrial fibrillation with RVR (HCC) Active Problems:   Lung cancer ([place])   New onset atrial fibrillation (HCC)   Hypotension   Atrial fibrillation with RVR This was new onset, patient was initially on Cardizem drip.  Normal sinus rhythm at this time.  Subsequently Cardizem has been changed to amiodarone . Ptient did initially present with hypotension and mildly elevated troponin. Cardiology on board and has been transitioned to amiodarone PO and Eliquis.  Currently getting amiodarone load.  Transthoracic Echocardiogram showed significant  LA enlargement.  Continue to telemetry monitor.  Hypotension Etiology thought to be cardiogenic.  Unlikely sepsis related.  Blood culture shows coagulase-negative in 1 bottle likely contaminant.  Complicated by home lisinopril use. Patient received IV fluid resuscitation in the ED. Chest x-ray suggests edema vs infection. Normal WBC, afebrile. Urine culture with no growth. Procalcitonin undetected.  Will discontinue Vanc/Cefepime at this time.  Moderate pericardial effusion Unknown etiology but in setting of lung cancer. Possibly contributing to dyspnea. Patient is supposed to go for get PET-CT scan today but will need telemetry monitor during transport.  Dyspnea on exertion Unknown etiology. Has been progressive for the past several months.  Likely secondary to underlying lung cancer, atrial fibrillation, pericardial effusion. -PT  eval when medically stable.  Hypothyroidism TSH of 26.3. Patient reports being very adherent with outpatient regimen. -Dose of Synthroid was increased to 137.5 mcg from 125 mcg.  Essential hypertension On lisinopril on prior to admission which is on hold secondary to hypotension. Blood pressure has improved but is still borderline low.  Lung cancer Recently diagnosed. Patient follows with [facility] Plan for outpatient PET scan that is scheduled for 12/30.  Will need telemetry monitor on transfer.  Anemia Normocytic anemia. In setting of underlying cancer.  Ferritin 235.  Serum iron slightly low.  Hyperlipidemia -Continue pravastatin   DVT prophylaxis: Eliquis  Code Status: Full code  Family Communication: Spoke with the patient at bedside.  Disposition Plan: Currently on amiodarone load.  Check PT.  Awaiting for PET CT scan today.  Disposition Home when okay with cardiology.   Consultants:   Cardiology  Procedures:   12/27: Transthoracic Echocardiogram  IMPRESSIONS   1. Left ventricular ejection fraction, by visual estimation, is 60 to 65%. The left ventricle has normal function. There is no left ventricular hypertrophy.  2. The left ventricle has no regional wall motion abnormalities.  3. Global right ventricle has normal systolic function.The right ventricular size is normal. No increase in right ventricular wall thickness.  4. Left atrial size was mildly dilated.  5. Right atrial size was normal.  6. Moderate pericardial effusion.  7. The pericardial effusion is anterior to the right ventricle.  8. Mild mitral annular calcification.  9. The mitral valve is normal in structure. No evidence of mitral valve regurgitation. No evidence of mitral stenosis. 10. The tricuspid valve is normal in structure. 11. The aortic valve is normal in structure. Aortic valve regurgitation is not visualized.  Mild aortic valve sclerosis without stenosis. 12. The pulmonic valve  was normal in structure. Pulmonic valve regurgitation is not visualized. 13. The inferior vena cava is normal in size with greater than 50% respiratory variability, suggesting right atrial pressure of 3 mmHg. 14. No prior Echocardiogram.  Antimicrobials:  Vancomycin> dc 12/30  Cefepime > dc 12/30   Subjective: Today, patient states that he feels dyspneic on minimal exertion and feels little funny.  Denies any chest pain.  Blood pressure marginally low but A. fib has converted to normal sinus rhythm.  Objective: Vitals:   04/18/19 1645 04/18/19 2135 04/19/19 0008 04/19/19 0533  BP: (!) 142/74 118/69 (!) 95/53 (!) 97/55  Pulse: 64 (!) 58 (!) 54 63  Resp: 16 17 17 20   Temp: 98.1 F (36.7 C) 98.1 F (36.7 C) 98.4 F (36.9 C) 98.4 F (36.9 C)  TempSrc: Oral Oral Oral Oral  SpO2: 97% 96% 96% 96%  Weight:      Height:        Intake/Output Summary (Last 24 hours) at 04/19/2019 1105 Last data filed at 04/18/2019 2122 Gross per 24 hour  Intake 1040.71 ml  Output 525 ml  Net 515.71 ml   Filed Weights   04/16/19 0444  Weight: 70.2 kg    Examination: General:  Average built, not in obvious distress HENT: Normocephalic, pupils equally reacting to light and accommodation.  No scleral pallor or icterus noted. Oral mucosa is moist.  Chest:  Clear breath sounds.  Diminished breath sounds bilaterally. No crackles or wheezes.  CVS: S1 &S2 heard. No murmur.  Regular rate and rhythm. Abdomen: Soft, nontender, nondistended.  Bowel sounds are heard.  Liver is not palpable, no abdominal mass palpated Extremities: No cyanosis, clubbing or edema.  Peripheral pulses are palpable. Psych: Alert, awake and oriented, normal mood CNS:  No cranial nerve deficits.  Power equal in all extremities.  No sensory deficits noted.  No cerebellar signs.   Skin: Warm and dry.  No rashes noted.   Data Reviewed: I have personally reviewed following labs and imaging studies  CBC: Recent Labs  Lab  04/15/19 2227 04/15/19 2230 04/16/19 0638 04/18/19 1043 04/19/19 0330  WBC 6.8  --  4.4 5.0 5.3  NEUTROABS 5.7  --   --   --   --   HGB 10.3* 9.9* 9.9* 10.4* 10.8*  HCT 32.1* 29.0* 30.9* 32.0* 32.9*  MCV 94.7  --  93.9 92.8 93.5  PLT 210  --  204 237 086   Basic Metabolic Panel: Recent Labs  Lab 04/15/19 2227 04/15/19 2230 04/16/19 0638 04/18/19 0323 04/19/19 0330  NA 139 138 138 135 134*  K 3.8 3.8 4.0 4.2 4.0  CL 110 106 108 105 105  CO2 22  --  21* 22 21*  GLUCOSE 130* 126* 161* 97 98  BUN 15 16 16 13 14   CREATININE 1.15 1.10 1.21 1.11 1.15  CALCIUM 9.2  --  9.6 10.0 9.7   GFR: Estimated Creatinine Clearance: 51.2 mL/min (by C-G formula based on SCr of 1.15 mg/dL). Liver Function Tests: Recent Labs  Lab 04/15/19 2242  AST 16  ALT 14  ALKPHOS 87  BILITOT 0.4  PROT 5.6*  ALBUMIN 2.7*   Recent Labs  Lab 04/15/19 2242  LIPASE 40   No results for input(s): AMMONIA in the last 168 hours. Coagulation Profile: Recent Labs  Lab 04/16/19 0043  INR 1.2   Cardiac Enzymes: No results for input(s): CKTOTAL, CKMB, CKMBINDEX, TROPONINI in the  last 168 hours. BNP (last 3 results) No results for input(s): PROBNP in the last 8760 hours. HbA1C: No results for input(s): HGBA1C in the last 72 hours. CBG: Recent Labs  Lab 04/17/19 1544  GLUCAP 84   Lipid Profile: No results for input(s): CHOL, HDL, LDLCALC, TRIG, CHOLHDL, LDLDIRECT in the last 72 hours. Thyroid Function Tests: No results for input(s): TSH, T4TOTAL, FREET4, T3FREE, THYROIDAB in the last 72 hours. Anemia Panel: No results for input(s): VITAMINB12, FOLATE, FERRITIN, TIBC, IRON, RETICCTPCT in the last 72 hours. Sepsis Labs: Recent Labs  Lab 04/15/19 2245 04/16/19 0045 04/16/19 0220 04/17/19 0308 04/18/19 0323  PROCALCITON  --   --  <0.10 <0.10 <0.10  LATICACIDVEN 1.7 1.3  --   --   --     Recent Results (from the past 240 hour(s))  Urine culture     Status: None   Collection Time:  04/15/19 11:17 PM   Specimen: Urine, Catheterized  Result Value Ref Range Status   Specimen Description URINE, CATHETERIZED  Final   Special Requests NONE  Final   Culture   Final    NO GROWTH Performed at [facility], [address] [place], [place] [address]    Report Status 04/17/2019 FINAL  Final  Blood culture (routine x 2)     Status: Abnormal   Collection Time: 04/16/19  1:10 AM   Specimen: BLOOD RIGHT ARM  Result Value Ref Range Status   Specimen Description BLOOD RIGHT ARM  Final   Special Requests   Final    BOTTLES DRAWN AEROBIC AND ANAEROBIC Blood Culture adequate volume   Culture  Setup Time   Final    AEROBIC BOTTLE ONLY GRAM POSITIVE COCCI CRITICAL RESULT CALLED TO, READ BACK BY AND VERIFIED WITH: KAREN AMEND @0206  ON 04/17/19 BY ROBINSON Z.     Culture (A)  Final    STAPHYLOCOCCUS SPECIES (COAGULASE NEGATIVE) THE SIGNIFICANCE OF ISOLATING THIS ORGANISM FROM A SINGLE SET OF BLOOD CULTURES WHEN MULTIPLE SETS ARE DRAWN IS UNCERTAIN. PLEASE NOTIFY THE MICROBIOLOGY DEPARTMENT WITHIN ONE WEEK IF SPECIATION AND SENSITIVITIES ARE REQUIRED. Performed at [facility], [place] [address] [place], [place] [address]    Report Status 04/18/2019 FINAL  Final  Respiratory Panel by RT PCR (Flu A&B, Covid) - Nasopharyngeal Swab     Status: None   Collection Time: 04/16/19  2:01 AM   Specimen: Nasopharyngeal Swab  Result Value Ref Range Status   SARS Coronavirus 2 by RT PCR NEGATIVE NEGATIVE Final    Comment: (NOTE) SARS-CoV-2 target nucleic acids are NOT DETECTED. The SARS-CoV-2 RNA is generally detectable in upper respiratoy specimens during the acute phase of infection. The lowest concentration of SARS-CoV-2 viral copies this assay can detect is 131 copies/mL. A negative result does not preclude SARS-Cov-2 infection and should not be used as the sole basis for treatment or other patient management decisions. A negative result may occur with  improper specimen  collection/handling, submission of specimen other than nasopharyngeal swab, presence of viral mutation(s) within the areas targeted by this assay, and inadequate number of viral copies (<131 copies/mL). A negative result must be combined with clinical observations, patient history, and epidemiological information. The expected result is Negative. Fact Sheet for Patients:  PinkCheek.be Fact Sheet for Healthcare Providers:  GravelBags.it This test is not yet ap proved or cleared by the [place] FDA and  has been authorized for detection and/or diagnosis of SARS-CoV-2 by FDA under an Emergency Use Authorization (EUA).  This EUA will remain  in effect (meaning this test can be used) for the duration of the COVID-19 declaration under Section 564(b)(1) of the Act, 21 U.S.C. section 360bbb-3(b)(1), unless the authorization is terminated or revoked sooner.    Influenza A by PCR NEGATIVE NEGATIVE Final   Influenza B by PCR NEGATIVE NEGATIVE Final    Comment: (NOTE) The Xpert Xpress SARS-CoV-2/FLU/RSV assay is intended as an aid in  the diagnosis of influenza from Nasopharyngeal swab specimens and  should not be used as a sole basis for treatment. Nasal washings and  aspirates are unacceptable for Xpert Xpress SARS-CoV-2/FLU/RSV  testing. Fact Sheet for Patients: PinkCheek.be Fact Sheet for Healthcare Providers: GravelBags.it This test is not yet approved or cleared by the [place] FDA and  has been authorized for detection and/or diagnosis of SARS-CoV-2 by  FDA under an Emergency Use Authorization (EUA). This EUA will remain  in effect (meaning this test can be used) for the duration of the  Covid-19 declaration under Section 564(b)(1) of the Act, 21  U.S.C. section 360bbb-3(b)(1), unless the authorization is  terminated or revoked. Performed at [facility], [place] [address], [place], [place] [address]   Blood culture (routine x 2)     Status: None (Preliminary result)   Collection Time: 04/16/19  3:14 AM   Specimen: BLOOD  Result Value Ref Range Status   Specimen Description BLOOD LEFT ANTECUBITAL  Final   Special Requests   Final    BOTTLES DRAWN AEROBIC AND ANAEROBIC Blood Culture adequate volume   Culture   Final    NO GROWTH 2 DAYS Performed at [facility], [place] [address], [place], [place] [address]    Report Status PENDING  Incomplete      Radiology Studies: No results found.  Scheduled Meds: . amiodarone  200 mg Oral BID  . apixaban  5 mg Oral BID  . calcium-vitamin D  1 tablet Oral BID WC  . levothyroxine  137.5 mcg Oral Q0600  . loratadine  10 mg Oral Daily  . pantoprazole  40 mg Oral BID  . polyvinyl alcohol  1 drop Both Eyes BID  . pravastatin  20 mg Oral QHS  . sertraline  50 mg Oral QHS  . traZODone  50 mg Oral QHS   Continuous Infusions: . ceFEPime (MAXIPIME) IV 2 g (04/19/19 0952)  . vancomycin 1,250 mg (04/18/19 1309)     LOS: 3 days   Oscar La, MD [facility]

## 2019-04-19 NOTE — Progress Notes (Signed)
Pulmonary Individual Treatment Plan  Patient Details  Name: Carlos Barrera MRN: [id number] Date of Birth: December 10, 1940 Referring Provider:     Pulmonary Rehab from 12/07/2018 in [facility] Cardiac and Pulmonary Rehab  Referring Provider  [name]      Initial Encounter Date:    Pulmonary Rehab from 12/07/2018 in [facility] Cardiac and Pulmonary Rehab  Date  12/07/18      Visit Diagnosis: Dyspnea, unspecified type  Patient's Home Medications on Admission: No current facility-administered medications for this visit. No current outpatient medications on file.  Facility-Administered Medications Ordered in Other Visits:  .  acetaminophen (TYLENOL) tablet 650 mg, 650 mg, Oral, Q4H PRN, Alcario Drought, Jared M, DO .  amiodarone (PACERONE) tablet 200 mg, 200 mg, Oral, BID, Theora Gianotti, NP, Stopped at 04/18/19 2200 .  apixaban (ELIQUIS) tablet 5 mg, 5 mg, Oral, BID, Theora Gianotti, NP, Stopped at 04/18/19 2200 .  calcium carbonate (TUMS - dosed in mg elemental calcium) chewable tablet 200 mg of elemental calcium, 1 tablet, Oral, TID WC PRN, Mariel Aloe, MD .  calcium-vitamin D (OSCAL WITH D) 500-200 MG-UNIT per tablet 1 tablet, 1 tablet, Oral, BID WC, Etta Quill, DO, 1 tablet at 04/17/19 1636 .  ceFEPIme (MAXIPIME) 2 g in sodium chloride 0.9 % 100 mL IVPB, 2 g, Intravenous, Q12H, Werner Lean, RPH, Last Rate: 200 mL/hr at 04/18/19 2122, 2 g at 04/18/19 2122 .  levothyroxine (SYNTHROID) tablet 137.5 mcg, 137.5 mcg, Oral, Q0600, Mariel Aloe, MD .  loratadine (CLARITIN) tablet 10 mg, 10 mg, Oral, Daily, Alcario Drought, Jared M, DO, 10 mg at 04/17/19 1046 .  meloxicam (MOBIC) tablet 15 mg, 15 mg, Oral, Daily PRN, Etta Quill, DO .  ondanset[facility]) injection 4 mg, 4 mg, Intravenous, Q6H PRN, Etta Quill, DO, 4 mg at 04/18/19 1054 .  pantoprazole (PROTONIX) EC tablet 40 mg, 40 mg, Oral, BID, Etta Quill, DO, 40 mg at 04/17/19 1046 .  polyvinyl alcohol  (LIQUIFILM TEARS) 1.4 % ophthalmic solution 1 drop, 1 drop, Both Eyes, BID, Alcario Drought, Jared M, DO, 1 drop at 04/18/19 2122 .  pravastatin (PRAVACHOL) tablet 20 mg, 20 mg, Oral, QHS, Gardner, Jared M, DO, 20 mg at 04/16/19 2145 .  sertraline (ZOLOFT) tablet 50 mg, 50 mg, Oral, QHS, Gardner, Jared M, DO, 50 mg at 04/19/19 0145 .  traZODone (DESYREL) tablet 50 mg, 50 mg, Oral, QHS, Gardner, Jared M, DO, 50 mg at 04/19/19 0146 .  vancomycin (VANCOREADY) IVPB 1250 mg/250 mL, 1,250 mg, Intravenous, Q24H, Werner Lean, RPH, Last Rate: 166.7 mL/hr at 04/18/19 1309, 1,250 mg at 04/18/19 1309  Past Medical History: Past Medical History:  Diagnosis Date  . Agent orange exposure   . Asthma    exercise induced asthma   . COPD (chronic obstructive pulmonary disease) ([place])   . DJD (degenerative joint disease)    a. 02/2019 s/p L TKA.  Marland Kitchen GERD (gastroesophageal reflux disease)   . Hearing loss    wears hearing aids  . History of cardiac cath    a. History of 2 cardiac catheterizations, last ~ 10 yrs ago @ [facility]-->reportedly nl.  . History of stress test    a. 2020 - pharmacologic stress testing @ [facility]-->reportedly nl.  . Hyperlipidemia   . Hypertension   . Hypothyroidism   . Insomnia   . Lung cancer ([place])    a. Dx 03/2019.  Marland Kitchen Prostate cancer ([place])   . PTSD (post-traumatic stress disorder)   .  Seasonal allergies   . Thyroid disease     Tobacco Use: Social History   Tobacco Use  Smoking Status Former Smoker  . Packs/day: 2.00  . Years: 20.00  . Pack years: 40.00  . Types: Cigarettes  . Quit date: 04/20/1980  . Years since quitting: 39.0  Smokeless Tobacco Never Used    Labs: Recent Review [facility] for [facility]iac and Pulmonary Rehab Latest Ref Rng & Units 04/15/2019   TCO2 22 - 32 mmol/L 25       Pulmonary Assessment Scores: Pulmonary Assessment Scores    Row Name 12/07/18 1316         ADL UCSD   SOB Score total  62     Rest  1     Walk  2     Stairs  5      Bath  2     Dress  2     Shop  2       CAT Score   CAT Score  29       mMRC Score   mMRC Score  3        UCSD: Self-administered rating of dyspnea associated with activities of daily living (ADLs) 6-point scale (0 = "not at all" to 5 = "maximal or unable to do because of breathlessness")  Scoring Scores range from 0 to 120.  Minimally important difference is 5 units  CAT: CAT can identify the health impairment of COPD patients and is better correlated with disease progression.  CAT has a scoring range of zero to 40. The CAT score is classified into four groups of low (less than 10), medium (10 - 20), high (21-30) and very high (31-40) based on the impact level of disease on health status. A CAT score over 10 suggests significant symptoms.  A worsening CAT score could be explained by an exacerbation, poor medication adherence, poor inhaler technique, or progression of COPD or comorbid conditions.  CAT MCID is 2 points  mMRC: mMRC (Modified Medical Research Council) Dyspnea Scale is used to assess the degree of baseline functional disability in patients of respiratory disease due to dyspnea. No minimal important difference is established. A decrease in score of 1 point or greater is considered a positive change.   Pulmonary Function Assessment:   Exercise Target Goals: Exercise Program Goal: Individual exercise prescription set using results from initial 6 min walk test and THRR while considering  patient's activity barriers and safety.   Exercise Prescription Goal: Initial exercise prescription builds to 30-45 minutes a day of aerobic activity, 2-3 days per week.  Home exercise guidelines will be given to patient during program as part of exercise prescription that the participant will acknowledge.  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 HR  101 bpm     Max Ex. BP  124/58     2 Minute Post BP  112/58       Interval HR   1 Minute HR  92     2 Minute HR  70 patient holding hands behind back     3 Minute HR  -- sig loss     4 Minute HR  60 see previous note - not sure HR accurate     2 Minute Post HR  78     Interval Heart Rate?  Yes       Interval Oxygen   Interval Oxygen?  Yes     Baseline Oxygen Saturation %  96 %     1 Minute Oxygen Saturation %  96 %     1 Minute Liters of Oxygen  0 L     2 Minute Oxygen Saturation %  95 %     2 Minute Liters of Oxygen  0 L     3 Minute Oxygen Saturation %  96 %     3 Minute Liters of Oxygen  0 L     4 Minute Oxygen Saturation %  95 %     4 Minute Liters of Oxygen  0 L     2 Minute Post Oxygen Saturation %  96 %     2 Minute Post Liters of Oxygen  0 L       Oxygen Initial Assessment: Oxygen Initial Assessment - 01/10/19 1320      Home Oxygen   Home Oxygen Device  --    Sleep Oxygen Prescription  --    Home Exercise Oxygen Prescription  --    Home at Rest Exercise Oxygen Prescription  --      Program Oxygen Prescription   Program Oxygen Prescription  --      Intervention   Short Term Goals  --    Long  Term Goals  --       Oxygen Re-Evaluation: Oxygen Re-Evaluation    Row Name 01/10/19 1345 02/02/19 1042 03/02/19 1052         Program Oxygen Prescription   Program Oxygen Prescription  None  None  None        Home Oxygen   Home Oxygen Device  None  None  None     Sleep Oxygen Prescription  None  None  None     Home Exercise Oxygen Prescription  None  None  None     Home at Rest Exercise Oxygen Prescription  None  None  None       Goals/Expected Outcomes   Short Term Goals  To learn and demonstrate proper use of respiratory medications;To learn and demonstrate proper pursed lip breathing techniques or other breathing techniques.;To learn and understand importance of maintaining oxygen saturations>88%;To learn and understand importance of monitoring SPO2 with pulse oximeter and demonstrate accurate use of the pulse oximeter.  To learn and demonstrate proper pursed lip breathing techniques or other breathing techniques.;To learn and understand importance of maintaining oxygen saturations>88%;To learn and understand  importance of monitoring SPO2 with pulse oximeter and demonstrate accurate use of the pulse oximeter.  To learn and demonstrate proper pursed lip breathing techniques or other breathing techniques.;To learn and understand importance of maintaining oxygen saturations>88%;To learn and understand importance of monitoring SPO2 with pulse oximeter and demonstrate accurate use of the pulse oximeter.     Long  Term Goals  Verbalizes importance of monitoring SPO2 with pulse oximeter and return demonstration;Maintenance of O2 saturations>88%;Exhibits proper breathing techniques, such as pursed lip breathing or other method taught during program session;Compliance with respiratory medication;Demonstrates proper use of MDI's  Maintenance of O2 saturations>88%;Exhibits proper breathing techniques, such as pursed lip breathing or other method taught during program session;Verbalizes importance of monitoring SPO2 with pulse oximeter and return demonstration  Maintenance of O2 saturations>88%;Exhibits proper breathing techniques, such as pursed lip breathing or other method taught during program session;Verbalizes  importance of monitoring SPO2 with pulse oximeter and return demonstration     Comments  Patient states that his respiratory medications other than Albuterol are not helping him. Informed him to speak with his pulmonologist about possible going over his medications and seeing if he can get something to help with his breathing. He feels like nothing is working for his mantainence drugs.  He does not have a pulse oximeter to check her oxygen saturation at home. Informed him where to get one and explained why it is important to have one. Practiced PLB with patient. Patient understands why PLB is important and to use it when he is short of breath. Reviewed that oxygen saturations should be 88 percent and above. He is meeting with his doctor soon to talk about his medications and his breathing.  Participant is practicing PLB during exercise. Encouraged to use outside of program as well. Having CT scan of lungs next Wednesday, trying to find reasons for shortness of breath and fatigue. Participant has not been able to purchase an oximeter for home. He reports it is always the same.     Goals/Expected Outcomes  Short: Speak with pulmonologist about medications. Long: take medications prescribed properly.  Short: use PLB with exertion. Long: use PLB on exertion proficiently and independently.  Short: Use PLB throughout the day at home. Long: Use PLB before becoming too short of breath and also to relax        Oxygen Discharge (Final Oxygen Re-Evaluation): Oxygen Re-Evaluation - 03/02/19 1052      Program Oxygen Prescription   Program Oxygen Prescription  None      Home Oxygen   Home Oxygen Device  None    Sleep Oxygen Prescription  None    Home Exercise Oxygen Prescription  None    Home at Rest Exercise Oxygen Prescription  None      Goals/Expected Outcomes   Short Term Goals  To learn and demonstrate proper pursed lip breathing techniques or other breathing techniques.;To learn and understand importance  of maintaining oxygen saturations>88%;To learn and understand importance of monitoring SPO2 with pulse oximeter and demonstrate accurate use of the pulse oximeter.    Long  Term Goals  Maintenance of O2 saturations>88%;Exhibits proper breathing techniques, such as pursed lip breathing or other method taught during program session;Verbalizes importance of monitoring SPO2 with pulse oximeter and return demonstration    Comments  Participant is practicing PLB during exercise. Encouraged to use outside of program as well. Having CT scan of lungs next Wednesday, trying to find reasons for shortness of breath and fatigue. Participant has not been able to  purchase an oximeter for home. He reports it is always the same.    Goals/Expected Outcomes  Short: Use PLB throughout the day at home. Long: Use PLB before becoming too short of breath and also to relax       Initial Exercise Prescription: Initial Exercise Prescription - 12/07/18 1300      Date of Initial Exercise RX and Referring Provider   Date  12/07/18    Referring Provider  [name]      Treadmill   MPH  1    Grade  0    Minutes  15   1 min then rest   METs  1.2      NuStep   Level  1    SPM  80    Minutes  15    METs  1.2      Arm Ergometer   Level  1    RPM  50    Minutes  15    METs  1.2      Biostep-RELP   Level  1    SPM  50    Minutes  15    METs  2      Prescription Details   Frequency (times per week)  2    Duration  Progress to 30 minutes of continuous aerobic without signs/symptoms of physical distress      Intensity   THRR 40-80% of Max Heartrate  104-130    Ratings of Perceived Exertion  11-13    Perceived Dyspnea  0-4      Progression   Progression  Continue to progress workloads to maintain intensity without signs/symptoms of physical distress.      Resistance Training   Training Prescription  Yes    Weight  3 lb    Reps  10-15       Perform Capillary Blood Glucose checks as  needed.  Exercise Prescription Changes: Exercise Prescription Changes    Row Name 12/07/18 1300 12/20/18 1500 01/06/19 0700 01/17/19 1400 01/31/19 1100     Response to Exercise   Blood Pressure (Admit)  106/48  112/68  126/56  142/64  132/74   Blood Pressure (Exercise)  124/58  128/60  130/60  128/64  156/84   Blood Pressure (Exit)  112/58  94/68  110/54  120/64  110/58   Heart Rate (Admit)  101 bpm  95 bpm  75 bpm  64 bpm  75 bpm   Heart Rate (Exercise)  92 bpm  88 bpm  86 bpm  84 bpm  98 bpm   Heart Rate (Exit)  78 bpm  80 bpm  72 bpm  72 bpm  60 bpm   Oxygen Saturation (Admit)  96 %  97 %  94 %  94 %  92 %   Oxygen Saturation (Exercise)  95 %  93 %  94 %  94 %  94 %   Oxygen Saturation (Exit)  96 %  98 %  97 %  97 %  98 %   Rating of Perceived Exertion (Exercise)  '17  11  15  11  15   ' Perceived Dyspnea (Exercise)  '4  2  3  3  4   ' Symptoms  none  none  none  none  SOB   Duration  Progress to 30 minutes of  aerobic without signs/symptoms of physical distress  Progress to 30 minutes of  aerobic without signs/symptoms of physical distress  Continue with 30 min of  aerobic exercise without signs/symptoms of physical distress.  Continue with 30 min of aerobic exercise without signs/symptoms of physical distress.  Continue with 30 min of aerobic exercise without signs/symptoms of physical distress.   Intensity  --  --  --  THRR unchanged  THRR unchanged     Progression   Progression  Continue to progress workloads to maintain intensity without signs/symptoms of physical distress.  Continue to progress workloads to maintain intensity without signs/symptoms of physical distress.  Continue to progress workloads to maintain intensity without signs/symptoms of physical distress.  Continue to progress workloads to maintain intensity without signs/symptoms of physical distress.  Continue to progress workloads to maintain intensity without signs/symptoms of physical distress.   Average METs  --  --  --   2.5  2.85     Resistance Training   Training Prescription  --  Yes  Yes  Yes  Yes   Weight  --  3 lb  3 lb  3 lbs  3 lbs   Reps  --  10-15  10-15  10-15  10-15     Interval Training   Interval Training  --  --  --  No  No     NuStep   Level  --  '4  3  3  2   ' SPM  --  80  80  --  --   Minutes  --  '15  15  30  30   ' METs  --  2  3.1  3.1  3.7     Biostep-RELP   Level  --  --  '1  1  1   ' SPM  --  --  50  --  --   Minutes  --  --  '30  30  30   ' METs  --  --  '2  2  2     ' Home Exercise Plan   Plans to continue exercise at  --  --  --  Home (comment) walking  Home (comment) walking   Frequency  --  --  --  Add 2 additional days to program exercise sessions.  Add 2 additional days to program exercise sessions.   Initial Home Exercises Provided  --  --  --  12/27/18  12/27/18   Row Name 02/14/19 1300 02/14/19 1400 03/01/19 1000         Response to Exercise   Blood Pressure (Admit)  132/58  --  122/60     Blood Pressure (Exercise)  142/60  --  134/54     Blood Pressure (Exit)  100/60  --  108/58     Heart Rate (Admit)  98 bpm  --  95 bpm     Heart Rate (Exercise)  98 bpm  --  96 bpm     Heart Rate (Exit)  101 bpm  --  90 bpm     Oxygen Saturation (Admit)  98 %  --  96 %     Oxygen Saturation (Exercise)  95 %  --  96 %     Oxygen Saturation (Exit)  98 %  --  97 %     Rating of Perceived Exertion (Exercise)  13  --  13     Perceived Dyspnea (Exercise)  4  --  3     Symptoms  --  --  SOB     Duration  Continue with 30 min of aerobic exercise without signs/symptoms of physical distress.  --  Continue  with 30 min of aerobic exercise without signs/symptoms of physical distress.     Intensity  THRR unchanged  --  THRR unchanged       Progression   Progression  Continue to progress workloads to maintain intensity without signs/symptoms of physical distress.  --  Continue to progress workloads to maintain intensity without signs/symptoms of physical distress.     Average METs  2.7  --  2.35        Resistance Training   Training Prescription  Yes  --  Yes     Weight  3 lb  --  3 lbs     Reps  10-15  --  10-15       Interval Training   Interval Training  No  --  No       NuStep   Level  3  --  3     Minutes  30  --  15     METs  2.7  --  2.7       Biostep-RELP   Level  --  --  1     Minutes  --  --  15     METs  --  --  2       Home Exercise Plan   Plans to continue exercise at  --  Home (comment) walking  Home (comment) walking     Frequency  --  Add 2 additional days to program exercise sessions.  Add 2 additional days to program exercise sessions.     Initial Home Exercises Provided  --  12/27/18  12/27/18        Exercise Comments: Exercise Comments    Row Name 12/13/18 1302 12/27/18 1056         Exercise Comments  First full day of exercise!  Patient was oriented to gym and equipment including functions, settings, policies, and procedures.  Patient's individual exercise prescription and treatment plan were reviewed.  All starting workloads were established based on the results of the 6 minute walk test done at initial orientation visit.  The plan for exercise progression was also introduced and progression will be customized based on patient's performance and goals.  Reviewed home exercise with pt today.  Pt plans to walk and look into [facility] for exercise.  Reviewed THR, pulse, RPE, sign and symptoms, NTG use, and when to call 911 or MD.  Also discussed weather considerations and indoor options.  Pt voiced understanding.  Buds shoulder has been sore since last session so he skipped the Arm crank today.         Exercise Goals and Review: Exercise Goals    Row Name 12/07/18 1309             Exercise Goals   Increase Physical Activity  Yes       Intervention  Provide advice, education, support and counseling about physical activity/exercise needs.;Develop an individualized exercise prescription for aerobic and resistive training based on initial evaluation  findings, risk stratification, comorbidities and participant's personal goals.       Expected Outcomes  Short Term: Attend rehab on a regular basis to increase amount of physical activity.;Long Term: Add in home exercise to make exercise part of routine and to increase amount of physical activity.;Long Term: Exercising regularly at least 3-5 days a week.       Increase Strength and Stamina  Yes       Intervention  Provide advice, education, support and counseling about physical  activity/exercise needs.;Develop an individualized exercise prescription for aerobic and resistive training based on initial evaluation findings, risk stratification, comorbidities and participant's personal goals.       Expected Outcomes  Short Term: Increase workloads from initial exercise prescription for resistance, speed, and METs.;Short Term: Perform resistance training exercises routinely during rehab and add in resistance training at home;Long Term: Improve cardiorespiratory fitness, muscular endurance and strength as measured by increased METs and functional capacity (6MWT)       Able to understand and use rate of perceived exertion (RPE) scale  Yes       Intervention  Provide education and explanation on how to use RPE scale       Expected Outcomes  Short Term: Able to use RPE daily in rehab to express subjective intensity level;Long Term:  Able to use RPE to guide intensity level when exercising independently       Able to understand and use Dyspnea scale  Yes       Intervention  Provide education and explanation on how to use Dyspnea scale       Expected Outcomes  Short Term: Able to use Dyspnea scale daily in rehab to express subjective sense of shortness of breath during exertion;Long Term: Able to use Dyspnea scale to guide intensity level when exercising independently       Knowledge and understanding of Target Heart Rate Range (THRR)  Yes       Intervention  Provide education and explanation of THRR including how  the numbers were predicted and where they are located for reference       Expected Outcomes  Short Term: Able to state/look up THRR;Short Term: Able to use daily as guideline for intensity in rehab;Long Term: Able to use THRR to govern intensity when exercising independently       Able to check pulse independently  Yes       Intervention  Provide education and demonstration on how to check pulse in carotid and radial arteries.;Review the importance of being able to check your own pulse for safety during independent exercise       Expected Outcomes  Short Term: Able to explain why pulse checking is important during independent exercise;Long Term: Able to check pulse independently and accurately       Understanding of Exercise Prescription  Yes       Intervention  Provide education, explanation, and written materials on patient's individual exercise prescription       Expected Outcomes  Short Term: Able to explain program exercise prescription;Long Term: Able to explain home exercise prescription to exercise independently          Exercise Goals Re-Evaluation : Exercise Goals Re-Evaluation    Row Name 12/13/18 1303 12/20/18 1545 12/27/18 1056 01/06/19 0736 01/17/19 1410     Exercise Goal Re-Evaluation   Exercise Goals Review  Increase Physical Activity;Increase Strength and Stamina;Able to understand and use rate of perceived exertion (RPE) scale;Able to understand and use Dyspnea scale;Knowledge and understanding of Target Heart Rate Range (THRR);Able to check pulse independently;Understanding of Exercise Prescription  Increase Physical Activity;Increase Strength and Stamina;Able to understand and use rate of perceived exertion (RPE) scale;Able to understand and use Dyspnea scale;Knowledge and understanding of Target Heart Rate Range (THRR);Able to check pulse independently;Understanding of Exercise Prescription  Increase Physical Activity;Increase Strength and Stamina;Able to understand and use rate  of perceived exertion (RPE) scale;Knowledge and understanding of Target Heart Rate Range (THRR);Able to check pulse independently;Understanding of Exercise Prescription  Increase  Physical Activity;Able to understand and use rate of perceived exertion (RPE) scale;Increase Strength and Stamina;Able to understand and use Dyspnea scale;Knowledge and understanding of Target Heart Rate Range (THRR);Able to check pulse independently;Understanding of Exercise Prescription  Increase Physical Activity;Increase Strength and Stamina;Understanding of Exercise Prescription   Comments  Reviewed RPE scale, THR and program prescription with pt today.  Pt voiced understanding and was given a copy of goals to take home.  Buds knee gives him a lot of trouble with exercise and he only uses one leg a lot of times.  he has progressed workloads and is reporting less SOB with exercise.  he started with working for 1-2 minutes and resting and is building up from there.  Reviewed home exercise with pt today.  Pt plans to walk and look into [facility] for exercise.  Reviewed THR, pulse, RPE, sign and symptoms, NTG use, and when to call 911 or MD.  Also discussed weather considerations and indoor options.  Pt voiced understanding.  [name] attends consistently and works in correct RPE range.  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  This EP discussed a PT consult for his shoulder as he has noticed it hurts other times at home.  He can do 30 min on NS and Bio without rest.  [name] has been doing well in rehab.  He is up to level 3 on the NuStep!  We will continue to monitor his progress.   Expected Outcomes  Short: Use RPE daily to regulate intensity. Long: Follow program prescription in THR.  Short - complete 15 min without resting Long - complete 30 min without rest  --  Short - continue to attend consistently Long - increase overal MET level  Short: Increase workload on BioStep.  Long: Continue to improve  stamina.   [place] Name 01/31/19 1132 02/02/19 1053 02/14/19 1401 03/01/19 0954 03/02/19 1033     Exercise Goal Re-Evaluation   Exercise Goals Review  Increase Physical Activity;Increase Strength and Stamina;Understanding of Exercise Prescription  Increase Strength and Stamina;Increase Physical Activity  Increase Physical Activity;Increase Strength and Stamina;Able to understand and use rate of perceived exertion (RPE) scale;Able to understand and use Dyspnea scale;Knowledge and understanding of Target Heart Rate Range (THRR);Able to check pulse independently;Understanding of Exercise Prescription  Increase Physical Activity;Increase Strength and Stamina;Understanding of Exercise Prescription  Increase Physical Activity;Increase Strength and Stamina;Understanding of Exercise Prescription   Comments  [name] continues to do well in rehab.  He should be ready to increase workloads again.  When last here, he was having a few bad breathing days.  We will continue to monitor his progress.  Patient states that he cannot do more exercise than what he is doing here. Informed him that we will talk about home exercise after he talkes to his doctor about his medications.  [name] has stated he does better with the arm work by just doing ROM due to shoulder issues.  He is able to do 30 min on T4 without stopping  [name] continues to do okay in rehab. He is having issues with SOB which limits his abilty to progress.  We will continue to monitor his progress.  [name] is doing arm exercises at home on his days off. He is choosing to do arm exercises bc he is unstable on his feet. [name] lives alone. This EP encouraged that doing fly exercises and pulling exercises will help him to strengthen 'breathing' muscles and demonstrated some of those for  him to use at home.Participant verbalized understanding.   Expected Outcomes  Short: Increase workloads.  Long: Continue to improve stamina.  Short: speak with physician about respiratory medications.  Long: create a home exercise plan for patient.  Short - continue to attend consistently Long - increase overal stamina  Short: Move up BioStep.  Long: Continue to improve stamina.  Short: Add fly and pulling exercises to home routine. Long: Continue to add home exercise as strength improves as he can handle more.   [place] Name 03/29/19 1333             Exercise Goal Re-Evaluation   Comments  Out since last review.          Discharge Exercise Prescription (Final Exercise Prescription Changes): Exercise Prescription Changes - 03/01/19 1000      Response to Exercise   Blood Pressure (Admit)  122/60    Blood Pressure (Exercise)  134/54    Blood Pressure (Exit)  108/58    Heart Rate (Admit)  95 bpm    Heart Rate (Exercise)  96 bpm    Heart Rate (Exit)  90 bpm    Oxygen Saturation (Admit)  96 %    Oxygen Saturation (Exercise)  96 %    Oxygen Saturation (Exit)  97 %    Rating of Perceived Exertion (Exercise)  13    Perceived Dyspnea (Exercise)  3    Symptoms  SOB    Duration  Continue with 30 min of aerobic exercise without signs/symptoms of physical distress.    Intensity  THRR unchanged      Progression   Progression  Continue to progress workloads to maintain intensity without signs/symptoms of physical distress.    Average METs  2.35      Resistance Training   Training Prescription  Yes    Weight  3 lbs    Reps  10-15      Interval Training   Interval Training  No      NuStep   Level  3    Minutes  15    METs  2.7      Biostep-RELP   Level  1    Minutes  15    METs  2      Home Exercise Plan   Plans to continue exercise at  Home (comment)   walking   Frequency  Add 2 additional days to program exercise sessions.    Initial Home Exercises Provided  12/27/18       Nutrition:  Target Goals: Understanding of nutrition guidelines, daily intake of sodium <1537m, cholesterol <2050m calories 30% from fat and 7% or less from saturated fats, daily to have 5 or more  servings of fruits and vegetables.  Biometrics: Pre Biometrics - 12/07/18 1310      Pre Biometrics   Height  5' 7.75" (1.721 m)    Weight  178 lb 12.8 oz (81.1 kg)    BMI (Calculated)  27.38        Nutrition Therapy Plan and Nutrition Goals: Nutrition Therapy & Goals - 12/07/18 1236      Nutrition Therapy   Diet  low Na, HH diet    Protein (specify units)  65-70g    Fiber  30 grams    Whole Grain Foods  3 servings    Saturated Fats  12 max. grams    Fruits and Vegetables  5 servings/day    Sodium  1.5 grams      Personal Nutrition  Goals   Nutrition Goal  ST: add protein foods to B (when he has it) and protein to snack after lunch LT: lose 5 lbs and limit SOB    Comments  Pt reports eating breakfast sometimes (OJ, muffin, sometimes yogurt). Takes out [employer] for lunch with veggies and salad and meat - sometimes potatoes. Pt will have small snack of fruit with whipped cream. Discussed maybe adding peanut butter or yogurt to add protein. Disucssed HH, low Na eating and higher needs. Pt reports having a balanced relationship with food.      Intervention Plan   Intervention  Prescribe, educate and counsel regarding individualized specific dietary modifications aiming towards targeted core components such as weight, hypertension, lipid management, diabetes, heart failure and other comorbidities.;Nutrition handout(s) given to patient.    Expected Outcomes  Short Term Goal: Understand basic principles of dietary content, such as calories, fat, sodium, cholesterol and nutrients.;Short Term Goal: A plan has been developed with personal nutrition goals set during dietitian appointment.;Long Term Goal: Adherence to prescribed nutrition plan.       Nutrition Assessments:   Nutrition Goals Re-Evaluation: Nutrition Goals Re-Evaluation    [place] Name 01/10/19 1059 02/21/19 1139           Goals   Nutrition Goal  ST: add protein foods to B (when he has it) and protein to snack after  lunch LT: lose 5 lbs and limit SOB  ST: add protein foods to B (when he has it) and protein to snack after lunch LT: lose 5 lbs and limit SOB      Comment  Pt reports feeling like walking is hard with pain and strength - mentioned how protein can help him build muslce so that he can increase his strength and mobility. Pt now consistently having breakfast " grain mix".  Continue with current changes      Expected Outcome  ST: add protein foods to B (when he has it) and protein to snack after lunch LT: lose 5 lbs and limit SOB  ST: add protein foods to B (when he has it) and protein to snack after lunch LT: lose 5 lbs and limit SOB         Nutrition Goals Discharge (Final Nutrition Goals Re-Evaluation): Nutrition Goals Re-Evaluation - 02/21/19 1139      Goals   Nutrition Goal  ST: add protein foods to B (when he has it) and protein to snack after lunch LT: lose 5 lbs and limit SOB    Comment  Continue with current changes    Expected Outcome  ST: add protein foods to B (when he has it) and protein to snack after lunch LT: lose 5 lbs and limit SOB       Psychosocial: Target Goals: Acknowledge presence or absence of significant depression and/or stress, maximize coping skills, provide positive support system. Participant is able to verbalize types and ability to use techniques and skills needed for reducing stress and depression.   Initial Review & Psychosocial Screening: Initial Psych Review & Screening - 12/06/18 1117      Initial Review   Current issues with  Current Stress Concerns;Current Anxiety/Panic    Source of Stress Concerns  Poor Coping Skills;Chronic Illness    Comments  PTSD on Zoloft, not currently seeing counselor, does not like being approached from behind, unexpected noises      [place]?  Yes    Comments  Church family, good close neighbors, son  lives nearby      Barriers   Psychosocial barriers to participate in program  The patient should  benefit from training in stress management and relaxation.;Psychosocial barriers identified (see note)      Screening Interventions   Interventions  Encouraged to exercise;Program counselor consult;To provide support and resources with identified psychosocial needs;Provide feedback about the scores to participant    Expected Outcomes  Short Term goal: Utilizing psychosocial counselor, staff and physician to assist with identification of specific Stressors or current issues interfering with healing process. Setting desired goal for each stressor or current issue identified.;Long Term Goal: Stressors or current issues are controlled or eliminated.;Short Term goal: Identification and review with participant of any Quality of Life or Depression concerns found by scoring the questionnaire.;Long Term goal: The participant improves quality of Life and PHQ9 Scores as seen by post scores and/or verbalization of changes       Quality of Life Scores:  Scores of 19 and below usually indicate a poorer quality of life in these areas.  A difference of  2-3 points is a clinically meaningful difference.  A difference of 2-3 points in the total score of the Quality of Life Index has been associated with significant improvement in overall quality of life, self-image, physical symptoms, and general health in studies assessing change in quality of life.  PHQ-9: Recent Review Flowsheet Data    Depression screen [facility] 2/9 01/31/2019 01/03/2019 12/07/2018   Decreased Interest 1 0 0   Down, Depressed, Hopeless 0 0 0   PHQ - 2 Score 1 0 0   Altered sleeping '3 2 3   ' Tired, decreased energy '1 1 2   ' Change in appetite - 0 1   Feeling bad or failure about yourself  0 0 0   Trouble concentrating 0 2 0   Moving slowly or fidgety/restless 0 0 3   Suicidal thoughts 3 0 0   PHQ-9 Score '8 5 9   ' Difficult doing work/chores Somewhat difficult Not difficult at all Somewhat difficult     Interpretation of Total Score  Total Score  Depression Severity:  1-4 = Minimal depression, 5-9 = Mild depression, 10-14 = Moderate depression, 15-19 = Moderately severe depression, 20-27 = Severe depression   Psychosocial Evaluation and Intervention:   Psychosocial Re-Evaluation: Psychosocial Re-Evaluation    Row Name 01/03/19 1116 01/12/19 1040 02/02/19 1035 03/02/19 1101       Psychosocial Re-Evaluation   Current issues with  --  Current Stress Concerns;Current Anxiety/Panic  History of Depression;Current Stress Concerns;Current Anxiety/Panic  History of Depression;Current Stress Concerns;Current Anxiety/Panic    Comments  PHQ 9 repeated today  Score was 5  He states that his PTSD is under controll. Exercise is helping him somewhat to keep his stress down. He has a positive attitude in class and is willing to stick it out until the end.  Reviewed patient health questionnaire (PHQ-9) with patient for follow up. Previously, patients score indicated signs/symptoms of depression. Reviewed to see if patient is improving symptom wise while in program. Score declined and patient states that it is because he has been more short of breath lately.  Being up is very stressful, feels like an invalid. [name] is being very proactive in his healthcare and continue asking the [place] for answers. Praised him for doing the right thing to find asnwers. Feels very bothered by the fact that he could do anything he wanted at the beginning of the year and now can't do anything. He  has friends, pastor, etc. that he can reach out to if he needs help. He takes meds for PTSD and feels that he good about that medication at this time.    Expected Outcomes  --  Short:continue to attend [facility]. Long: maintain exercise to keep stress at a minimum.  Short: Continue to work toward an improvement in [place] scores by attending [facility] regularly. Long: Continue to improve stress and depression coping skills by talking with staff and attending [facility] regularly and work toward a  positive mental state.  Short: Continue to work on getting health questions and concerns answered. Long: Continue to get out of the house and exercise as a stress relief and visit with friends virtually if possible.    Interventions  --  Encouraged to attend [facility] for the exercise  Encouraged to attend [facility] for the exercise  --    Continue Psychosocial Services   --  Follow up required by staff  Follow up required by staff  --       Psychosocial Discharge (Final Psychosocial Re-Evaluation): Psychosocial Re-Evaluation - 03/02/19 1101      Psychosocial Re-Evaluation   Current issues with  History of Depression;Current Stress Concerns;Current Anxiety/Panic    Comments  Being up is very stressful, feels like an invalid. [name] is being very proactive in his healthcare and continue asking the [place] for answers. Praised him for doing the right thing to find asnwers. Feels very bothered by the fact that he could do anything he wanted at the beginning of the year and now can't do anything. He has friends, pastor, etc. that he can reach out to if he needs help. He takes meds for PTSD and feels that he good about that medication at this time.    Expected Outcomes  Short: Continue to work on getting health questions and concerns answered. Long: Continue to get out of the house and exercise as a stress relief and visit with friends virtually if possible.       Education: Education Goals: Education classes will be provided on a weekly basis, covering required topics. Participant will state understanding/return demonstration of topics presented.  Learning Barriers/Preferences: Learning Barriers/Preferences - 12/06/18 1115      Learning Barriers/Preferences   Learning Barriers  Sight;Hearing;Exercise Concerns   wears glasses and hearing aid, wears knee brace   Learning Preferences  Individual Instruction;Written Material       Education Topics:  Initial Evaluation  Education: - Verbal, written and demonstration of respiratory meds, oximetry and breathing techniques. Instruction on use of nebulizers and MDIs and importance of monitoring MDI activations.   General Nutrition Guidelines/Fats and Fiber: -Group instruction provided by verbal, written material, models and posters to present the general guidelines for heart healthy nutrition. Gives an explanation and review of dietary fats and fiber.   Controlling Sodium/Reading Food Labels: -Group verbal and written material supporting the discussion of sodium use in heart healthy nutrition. Review and explanation with models, verbal and written materials for utilization of the food label.   Exercise Physiology & General Exercise Guidelines: - Group verbal and written instruction with models to review the exercise physiology of the cardiovascular system and associated critical values. Provides general exercise guidelines with specific guidelines to those with heart or lung disease.    Aerobic Exercise & Resistance Training: - Gives group verbal and written instruction on the various components of exercise. Focuses on aerobic and resistive training programs and the benefits of this training and how to safely  progress through these programs.   Pulmonary Rehab from 02/23/2019 in [facility] Cardiac and Pulmonary Rehab  Date  02/23/19  Educator  jh  Instruction Review Code  1- Verbalizes Understanding      Flexibility, Balance, Mind/Body Relaxation: Provides group verbal/written instruction on the benefits of flexibility and balance training, including mind/body exercise modes such as yoga, pilates and tai chi.  Demonstration and skill practice provided.   Stress and Anxiety: - Provides group verbal and written instruction about the health risks of elevated stress and causes of high stress.  Discuss the correlation between heart/lung disease and anxiety and treatment options. Review healthy ways to manage with  stress and anxiety.   Depression: - Provides group verbal and written instruction on the correlation between heart/lung disease and depressed mood, treatment options, and the stigmas associated with seeking treatment.   Exercise & Equipment Safety: - Individual verbal instruction and demonstration of equipment use and safety with use of the equipment.   Pulmonary Rehab from 12/07/2018 in [facility] Cardiac and Pulmonary Rehab  Date  12/07/18  Educator  AS  Instruction Review Code  1- Verbalizes Understanding      Infection Prevention: - Provides verbal and written material to individual with discussion of infection control including proper hand washing and proper equipment cleaning during exercise session.   Pulmonary Rehab from 12/07/2018 in [facility] Cardiac and Pulmonary Rehab  Date  12/07/18  Educator  AS  Instruction Review Code  1- Verbalizes Understanding      Falls Prevention: - Provides verbal and written material to individual with discussion of falls prevention and safety.   Pulmonary Rehab from 12/07/2018 in [facility] Cardiac and Pulmonary Rehab  Date  12/07/18  Educator  AS  Instruction Review Code  1- Verbalizes Understanding      Diabetes: - Individual verbal and written instruction to review signs/symptoms of diabetes, desired ranges of glucose level fasting, after meals and with exercise. Advice that pre and post exercise glucose checks will be done for 3 sessions at entry of program.   Chronic Lung Diseases: - Group verbal and written instruction to review updates, respiratory medications, advancements in procedures and treatments. Discuss use of supplemental oxygen including available portable oxygen systems, continuous and intermittent flow rates, concentrators, personal use and safety guidelines. Review proper use of inhaler and spacers. Provide informative websites for self-education.    Energy Conservation: - Provide group verbal and written instruction for methods to  conserve energy, plan and organize activities. Instruct on pacing techniques, use of adaptive equipment and posture/positioning to relieve shortness of breath.   Triggers and Exacerbations: - Group verbal and written instruction to review types of environmental triggers and ways to prevent exacerbations. Discuss weather changes, air quality and the benefits of nasal washing. Review warning signs and symptoms to help prevent infections. Discuss techniques for effective airway clearance, coughing, and vibrations.   AED/CPR: - Group verbal and written instruction with the use of models to demonstrate the basic use of the AED with the basic ABC's of resuscitation.   Anatomy and Physiology of the Lungs: - Group verbal and written instruction with the use of models to provide basic lung anatomy and physiology related to function, structure and complications of lung disease.   Anatomy & Physiology of the Heart: - Group verbal and written instruction and models provide basic cardiac anatomy and physiology, with the coronary electrical and arterial systems. Review of Valvular disease and Heart Failure   Cardiac Medications: - Group verbal and written instruction  to review commonly prescribed medications for heart disease. Reviews the medication, class of the drug, and side effects.   Know Your Numbers and Risk Factors: -Group verbal and written instruction about important numbers in your health.  Discussion of what are risk factors and how they play a role in the disease process.  Review of Cholesterol, Blood Pressure, Diabetes, and BMI and the role they play in your overall health.   Pulmonary Rehab from 02/09/2019 in [facility] Cardiac and Pulmonary Rehab  Date  02/09/19  Educator  [facility]  Instruction Review Code  1- Verbalizes Understanding      Sleep Hygiene: -Provides group verbal and written instruction about how sleep can affect your health.  Define sleep hygiene, discuss sleep cycles and impact  of sleep habits. Review good sleep hygiene tips.    Other: -Provides group and verbal instruction on various topics (see comments)    Knowledge Questionnaire Score: Knowledge Questionnaire Score - 12/07/18 1322      Knowledge Questionnaire Score   Pre Score  14/18        Core Components/Risk Factors/Patient Goals at Admission: Personal Goals and Risk Factors at Admission - 12/07/18 1311      Core Components/Risk Factors/Patient Goals on Admission    Weight Management  Yes;Weight Maintenance    Intervention  Weight Management: Develop a combined nutrition and exercise program designed to reach desired caloric intake, while maintaining appropriate intake of nutrient and fiber, sodium and fats, and appropriate energy expenditure required for the weight goal.;Weight Management: Provide education and appropriate resources to help participant work on and attain dietary goals.    Admit Weight  178 lb 12.8 oz (81.1 kg)    Goal Weight: Short Term  168 lb (76.2 kg)    Goal Weight: Long Term  158 lb (71.7 kg)    Expected Outcomes  Short Term: Continue to assess and modify interventions until short term weight is achieved;Long Term: Adherence to nutrition and physical activity/exercise program aimed toward attainment of established weight goal    Intervention  Provide education on lifestyle modifcations including regular physical activity/exercise, weight management, moderate sodium restriction and increased consumption of fresh fruit, vegetables, and low fat dairy, alcohol moderation, and smoking cessation.;Monitor prescription use compliance.    Expected Outcomes  Short Term: Continued assessment and intervention until BP is < 140/66m HG in hypertensive participants. < 130/831mHG in hypertensive participants with diabetes, heart failure or chronic kidney disease.;Long Term: Maintenance of blood pressure at goal levels.    Intervention  Provide education and support for participant on nutrition &  aerobic/resistive exercise along with prescribed medications to achieve LDL <707mHDL >47m7m  Expected Outcomes  Short Term: Participant states understanding of desired cholesterol values and is compliant with medications prescribed. Participant is following exercise prescription and nutrition guidelines.;Long Term: Cholesterol controlled with medications as prescribed, with individualized exercise RX and with personalized nutrition plan. Value goals: LDL < 70mg10mL > 40 mg.       Core Components/Risk Factors/Patient Goals Review:  Goals and Risk Factor Review    Row Name 01/12/19 1043 02/02/19 1048 02/07/19 1036 03/02/19 1108       Core Components/Risk Factors/Patient Goals Review   Personal Goals Review  Weight Management/Obesity;Hypertension;Lipids  Weight Management/Obesity;Lipids;Improve shortness of breath with ADL's;Hypertension  Improve shortness of breath with ADL's;Weight Management/Obesity;Hypertension;Lipids  Improve shortness of breath with ADL's;Weight Management/Obesity;Hypertension;Lipids    Review  Patient wasnt to lose a few pounds. He wants to lose around 10 pounds.  His goal is to reach 170 pounds. He does not check his blood pressure at home and he has a machine. informed him to check his blood pressure at home when he wakes up. He does noit have a pulse oximeter and was informed to get one. He states he can get one through his insurance.  Patient states that he is getting more short of breath and is seeing his doctor to figure out if he can get respiratory medications. He has been having more trouble with his shortness of breath lately and has not been able to do the things that he wants to. He was going to go to church and could not shower or go due to him being short of breath.  [name] sees his pulmonologist tomorrow - he is taking meds as directed but still gets very short of breath.  he has done well with exercise and has seen imporvement there.  [name] is taking his medications as  prescribed. He is happy with his current weight but does not want to gain any. [name] wants to ask Dr if he can take less meds for BP as he thinks that may contribute to some of his fatigue.    Expected Outcomes  Short: check blood pressure at home daily. Obtain a pulse oximeter. Long: maintain blood pressure and oxygen reading at home independently.  Short: Attend [facility] regularly to improve shortness of breath with ADL's. Long: maintain independence with ADL's  Short - see Dr tomorrow  Long - improve shortness of breath with ADLs  Short: take meds as presscribed and go to Drs appts Long- Improve energy level and shortness of breath.       Core Components/Risk Factors/Patient Goals at Discharge (Final Review):  Goals and Risk Factor Review - 03/02/19 1108      Core Components/Risk Factors/Patient Goals Review   Personal Goals Review  Improve shortness of breath with ADL's;Weight Management/Obesity;Hypertension;Lipids    Review  [name] is taking his medications as prescribed. He is happy with his current weight but does not want to gain any. [name] wants to ask Dr if he can take less meds for BP as he thinks that may contribute to some of his fatigue.    Expected Outcomes  Short: take meds as presscribed and go to Drs appts Long- Improve energy level and shortness of breath.       ITP Comments: ITP Comments    Row Name 12/06/18 1134 12/07/18 1259 12/28/18 0611 01/10/19 1316 01/25/19 1335   ITP Comments  Virtual Orientation completed.  Documentation can be found in [place] paperwork linked to encounter in media section.  Original referall was faxed on 11/16/18.  Completed initial 6MWT and nutrition eval.  Initial ITP created and sent to review to Dr Sabra Heck  30 Day review. Continue with ITP unless directed changes per Medical Director review.  Patient states that he was not sure about an event that happened at home. He states that he had a muscle soreness or burn in his chest. He states that it was not painful,  or heartburn but something he realized.Informed him to make note if it happens again write down what he was doing before hand.  30 day review completed. ITP sent to Dr. Emily Filbert, Medical Director of Cardiac and Pulmonary Rehab. Continue with ITP unless changes are made by physician.  Department closed starting 10/2 until further notice by infection prevention and Health at Work teams for [place].   [place] Name 02/22/19 [phone] 03/02/19 1113  03/21/19 1332 03/22/19 1011 03/29/19 1332   ITP Comments  30 day review completed. Continue with ITP sent to Dr. Emily Filbert, Medical Director of Cardiac and Pulmonary Rehab for review , changes as needed and signature.  Patient having knee replacement next Friday and expects to be back as soon as possible after a quick recovery. Discussed patient needs of a shower chair, grab rails, someone to take and drive him home, to stay with him after surgery, to prepare meals, and help him with medications.  Called to check on pt.  Had TKR on 03/09/19 and sent SNF.  Left message.  30 day review competed . ITP sent to Dr Emily Filbert for review, changes as needed and ITP approval signature.  [name] had a knee replacement surgery.  He continues to be out recieving HHPT.  His next follow up is in 4 weeks.   [place] Name 04/11/19 1442 04/19/19 0847         ITP Comments  [name] was recently diagnosed with lung cancer.  He sees the oncologist this week and will call us with plan.  30 day review competed . ITP sent to Dr Emily Filbert for review, changes as needed and ITP approval signature  Currently in hospital         Comments:
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
## 2019-04-19 NOTE — Progress Notes (Signed)
PT Cancellation Note  Patient Details Name: Carlos Barrera MRN: [id number] DOB: 29-Jan-1941   Cancelled Treatment:    Reason Eval/Treat Not Completed: Other (comment) per chart review, patient getting transported to [facility] for PET scan at 10:30am today. Will follow acutely and attempt to return later in day if patient is available and if PT time/schedule allow.    Windell Norfolk, DPT, PN1   Supplemental Physical Therapist [facility]    Pager [phone] Acute Rehab Office [phone]

## 2019-04-19 NOTE — Progress Notes (Signed)
Occupational Therapy Evaluation Patient Details Name: Carlos Barrera MRN: [id number] DOB: [birth year] Today's Date: 04/19/2019    History of Present Illness 78 yo male with complaints of increasing SOB past several months, pt awakened from sleep with increased difficulty breathing, EMS found him to be in Afib. pt diagnosed this past week with lung CA has not started treatment. PMH includes HTN, prostate CA s/p treatment, PTSD, agent orange exposure, recent L TKA in November   Clinical Impression   PTA, pt was living at home alone with neighbors and family to assist with IADL, pt reports he was independent with ADL but it would take him extended time to complete tasks (4.5hr to make a bowl of oatmeal). Pt reports he was modified independent with functional mobility at rollator level. Pt currently requires minguard for stand-pivot simulated toilet transfer, he is significantly limited by decreased activity tolerance, he stood for 2 min before requiring return to sitting. Pt able to tolerate sitting EOB for >32min. Due to decline in current level of function, pt would benefit from acute OT to address established goals to facilitate safe D/C to venue listed below. At this time, recommend HHOT follow-up. Will continue to follow acutely.     Follow Up Recommendations  Home health OT;Supervision - Intermittent    Equipment Recommendations  3 in 1 bedside commode    Recommendations for Other Services       Precautions / Restrictions Precautions Precautions: Fall Restrictions Weight Bearing Restrictions: No      Mobility Bed Mobility Overal bed mobility: Independent                Transfers Overall transfer level: Needs assistance Equipment used: None Transfers: Sit to/from Stand Sit to Stand: Min guard         General transfer comment: mildly increased time to stand, pt safe with transfer and reporting he does not need RW however suspect improve stability;stability  improved with RW    Balance Overall balance assessment: Mild deficits observed, not formally tested(steady sitting EOB, can stand and transfer without AD but would benefit from RW for added stability)                                         ADL either performed or assessed with clinical judgement   ADL Overall ADL's : Needs assistance/impaired Eating/Feeding: Set up;Sitting   Grooming: Set up;Sitting   Upper Body Bathing: Set up;Sitting   Lower Body Bathing: Min guard;Sit to/from stand   Upper Body Dressing : Set up;Sitting   Lower Body Dressing: Min guard;Sit to/from stand   Toilet Transfer: Psychologist, sport and exercise Details (indicate cue type and reason): simulated to/from recliner Toileting- Water quality scientist and Hygiene: Min guard;Sit to/from stand       Functional mobility during ADLs: Min guard;Minimal assistance;Rolling walker General ADL Comments: pt significantly limited by decreased activity tolerance, pt stood at edge of bed for 2 min then required seated rest break. Pt able to sit for >4min while maintaining steady conversation;provided and educated pt on energy conservation strategies and environmental modifications and setup to assist with ADL completion     Vision Baseline Vision/History: Wears glasses Wears Glasses: At all times Patient Visual Report: No change from baseline Vision Assessment?: No apparent visual deficits     Perception     Praxis      Pertinent Vitals/Pain Pain Assessment: No/denies pain  Hand Dominance     Extremity/Trunk Assessment Upper Extremity Assessment Upper Extremity Assessment: Overall WFL for tasks assessed   Lower Extremity Assessment Lower Extremity Assessment: Overall WFL for tasks assessed   Cervical / Trunk Assessment Cervical / Trunk Assessment: Normal   Communication Communication Communication: No difficulties   Cognition Arousal/Alertness: Awake/alert Behavior  During Therapy: WFL for tasks assessed/performed;Agitated Overall Cognitive Status: Within Functional Limits for tasks assessed                                 General Comments: upon arrival, pt agitated with today's events of not being able to get PET scan done today and still with unanswered questions about status of cancer   General Comments  vss throughout HR 70s throughout session    Exercises     Shoulder Instructions      Home Living Family/patient expects to be discharged to:: Private residence Living Arrangements: Alone Available Help at Discharge: Friend(s);Available PRN/intermittently(friends, neighbors and people from church able to help some, multiple people bring food, PCA 1x/week for bathing) Type of Home: House Home Access: Stairs to enter [employer] of Steps: 1 small step (per chart review from previous encounter pt has 3)   Home Layout: One level     Bathroom Shower/Tub: Occupational psychologist: Handicapped height Bathroom Accessibility: Yes   Home Equipment: Environmental consultant - 2 wheels;Walker - 4 wheels;Wheelchair - manual;Grab bars - tub/shower;Shower seat;Grab bars - toilet   Additional Comments: uses rollator in the home as a "just in case" dont use it to steady, hasnt been out in community outside of to doctors appointments      Prior Functioning/Environment Level of Independence: Independent with assistive device(s)        Comments: pt reporting increased difficulty with daily activities due to SOB, pt reporting it took him 4.5 hours to make oatmeal        OT Problem List: Decreased activity tolerance;Decreased knowledge of use of DME or AE;Decreased safety awareness;Impaired balance (sitting and/or standing);Cardiopulmonary status limiting activity      OT Treatment/Interventions: Self-care/ADL training;Therapeutic exercise;Energy conservation;DME and/or AE instruction;Therapeutic activities;Patient/family  education;Balance training    OT Goals(Current goals can be found in the care plan section) Acute Rehab OT Goals Patient Stated Goal: complete ADL in normal amount of time OT Goal Formulation: With patient Time For Goal Achievement: 05/03/19 Potential to Achieve Goals: Good ADL Goals Pt Will Perform Grooming: with modified independence;sitting Pt Will Perform Upper Body Dressing: with modified independence;sitting Pt Will Perform Lower Body Dressing: with modified independence;sit to/from stand Pt Will Transfer to Toilet: with modified independence;ambulating Additional ADL Goal #1: Pt will demonstrate independence with 3 energy conservation strategies during ADL.  OT Frequency: Min 2X/week   Barriers to D/C: Decreased caregiver support  pt lives alone       Co-evaluation              AM-PAC OT "6 Clicks" Daily Activity     Outcome Measure Help from another person eating meals?: A Little Help from another person taking care of personal grooming?: A Little Help from another person toileting, which includes using toliet, bedpan, or urinal?: A Little Help from another person bathing (including washing, rinsing, drying)?: A Little Help from another person to put on and taking off regular upper body clothing?: A Little Help from another person to put on and taking off regular lower body clothing?: A Little  6 Click Score: 18   End of Session Equipment Utilized During Treatment: Gait belt;Rolling walker Nurse Communication: Mobility status  Activity Tolerance: Patient tolerated treatment well Patient left: in bed;with call bell/phone within reach  OT Visit Diagnosis: Unsteadiness on feet (R26.81);Other abnormalities of gait and mobility (R26.89);Muscle weakness (generalized) (M62.81)                Time: [id number] OT Time Calculation (min): 28 min Charges:  OT General Charges $OT Visit: 1 Visit OT Evaluation $OT Eval Moderate Complexity: 1 Mod OT Treatments $Self  Care/Home Management : 8-22 mins  Dorinda Hill OTR/L Acute Rehabilitation Services Office: [place] 04/19/2019, 3:29 PM

## 2019-04-19 NOTE — Progress Notes (Signed)
PTAR here to transport patient to [facility] for PET scan, PTAR stated that they do not not have cardiac monitoring and need an order to transport patient without cardiac monitoring, [place] MD notified, MD does not want patient to be transported without tele, PTAR Notified, IR dept at [facility] notified, they advised that patient will need to reschedule his appointment and get PET scan done outpatient after discharge.Juliann Pulse RN updated

## 2019-04-20 LAB — COMPREHENSIVE METABOLIC PANEL
ALT: 14 U/L (ref 0–44)
AST: 19 U/L (ref 15–41)
Albumin: 2.5 g/dL — ABNORMAL LOW (ref 3.5–5.0)
Alkaline Phosphatase: 79 U/L (ref 38–126)
Anion gap: 8 (ref 5–15)
BUN: 15 mg/dL (ref 8–23)
CO2: 22 mmol/L (ref 22–32)
Calcium: 9.5 mg/dL (ref 8.9–10.3)
Chloride: 105 mmol/L (ref 98–111)
Creatinine, Ser: 1.13 mg/dL (ref 0.61–1.24)
GFR calc Af Amer: >60 mL/min (ref >60)
GFR calc non Af Amer: >60 mL/min (ref >60)
Glucose, Bld: 109 mg/dL — ABNORMAL HIGH (ref 70–99)
Potassium: 3.6 mmol/L (ref 3.5–5.1)
Sodium: 135 mmol/L (ref 135–145)
Total Bilirubin: 0.5 mg/dL (ref 0.3–1.2)
Total Protein: 5.2 g/dL — ABNORMAL LOW (ref 6.5–8.1)

## 2019-04-20 LAB — CBC
HCT: 30.6 % — ABNORMAL LOW (ref 39.0–52.0)
Hemoglobin: 10.1 g/dL — ABNORMAL LOW (ref 13.0–17.0)
MCH: 30.7 pg (ref 26.0–34.0)
MCHC: 33 g/dL (ref 30.0–36.0)
MCV: 93 fL (ref 80.0–100.0)
Platelets: 264 10*3/uL (ref 150–400)
RBC: 3.29 MIL/uL — ABNORMAL LOW (ref 4.22–5.81)
RDW: 15 % (ref 11.5–15.5)
WBC: 5.1 10*3/uL (ref 4.0–10.5)
nRBC: 0 % (ref 0.0–0.2)

## 2019-04-20 LAB — MAGNESIUM: Magnesium: 1.7 mg/dL (ref 1.7–2.4)

## 2019-04-20 MED ORDER — BISACODYL 10 MG RE SUPP
10.0000 mg | Freq: Once | RECTAL | Status: AC
  Administered 2019-04-20: 10 mg via RECTAL
  Filled 2019-04-20: qty 1

## 2019-04-20 NOTE — TOC Progression Note (Signed)
Transition of Care (TOC) - Progression Note  Marvetta Gibbons RN, BSN Transitions of Care Unit 4E- RN Case Manager [phone]   Patient Details  Name: HILDA WEXLER MRN: [id number] Date of Birth: 10-05-1940  Transition of Care [facility]) CM/SW Contact  Dahlia Client, Romeo Rabon, RN Phone Number: 04/20/2019, 3:48 PM  Clinical Narrative:    Pt lives home alone, has friends and family that comes to assist him. Pt wanting someone to come help him with meals/self care needs- it has been explained to pt that insurance does not cover these services under [facility] and that he would need to arrange this type of services himself either through his friends/family or with a private duty company- pt states his pastor is working on it making arrangements for what he needs. Pt is still refusing [place] services at this time- states that he does not feel he would benefit from them. Orders placed for HHPT/OT- pt has declined any referral. Pt does want a 3n1 for home- order has been placed and call made to [facility] with [employer] for DME need- 3n1 to be delivered to room prior to discharge-   Expected Discharge Plan: Home/Self Care Barriers to Discharge: Continued Medical Work up  Expected Discharge Plan and Services Expected Discharge Plan: Home/Self Care   Discharge Planning Services: CM Consult Post Acute Care Choice: Durable Medical Equipment, Home Health Living arrangements for the past 2 months: Single Family Home                 DME Arranged: 3-N-1 DME Agency: [employer] Date DME Agency Contacted: 04/20/19 Time DME Agency Contacted: [phone] Representative spoke with at DME Agency: zach HH Arranged: PT, OT, Patient Refused [place]           Social Determinants of Health (SDOH) Interventions    Readmission Risk Interventions No flowsheet data found.

## 2019-04-20 NOTE — Progress Notes (Signed)
PT Cancellation Note  Patient Details Name: ATTICUS WEDIN MRN: [id number] DOB: 1940-05-23   Cancelled Treatment:    Reason Eval/Treat Not Completed: Patient declined, no reason specified  Pt declined PT again this afternoon. States that he has been up to the bsc and chair multiple times.  Reports he is getting assistance set up at home, has rollator so that he can take breaks, and does not have far to ambulate in home.  Discussed that only ambulated 5-10' here with significant shortness of breath and home would likely be further, he reports that is enough for his home.  Pt feels like he is unable to build endurance due to lung CA.   Discussed may need w/c if needing to ambulate further than 5-10'.  Will follow up as able.  Was with pt 1648-1700. Maggie Font, PT Acute Rehab Services Pager [phone] [facility] [phone] [facility] [phone]    Karlton Lemon 04/20/2019, 5:24 PM

## 2019-04-20 NOTE — Progress Notes (Signed)
PT Cancellation Note  Patient Details Name: Carlos Barrera MRN: [id number] DOB: February 23, 1941   Cancelled Treatment:    Reason Eval/Treat Not Completed: Patient declined, no reason specified Pt declined due to wanting to talk to visitor.  Encouraged on OOB activity, reports he is getting up to chair. Request PT to follow up at later time. Maggie Font, PT Acute Rehab Services Pager [phone] [facility] [facility] [phone]    Karlton Lemon 04/20/2019, 12:03 PM

## 2019-04-20 NOTE — Progress Notes (Addendum)
PROGRESS NOTE    Carlos Barrera  TTS:[id number] DOB: 04/19/1941 DOA: 04/15/2019 PCP: [facility]   Brief Narrative: Carlos Barrera is a 78 y.o. male with medical history significant of HTN, prostate CA s/p treatment presented to the hospital due to shortness of breath and found to have afib with RVR. Started on heparin drip and cardizem drip and was seen by cardiology. Patient was then transitioned to amiodarone and Eliquis. Complicated by hypotension on arrival.  Assessment & Plan:   Principal Problem:   Atrial fibrillation with RVR (HCC) Active Problems:   Lung cancer ([place])   New onset atrial fibrillation (HCC)   Hypotension   Atrial fibrillation with RVR This was new onset, patient was initially on Cardizem drip.  Normal sinus rhythm at this time.  Subsequently Cardizem has been changed to amiodarone . Patient did initially present with hypotension and mildly elevated troponin. Cardiology on board and patient has been transitioned to amiodarone PO and Eliquis.  Currently getting amiodarone load.  Transthoracic Echocardiogram showed significant  LA enlargement.  Cardiology recommended Eliquis 5 mg twice a day on discharge with amiodarone.  Hypotension Etiology thought to be cardiogenic.  Still borderline low. Hold off with betablockers or ACEI for now. Cardiology recommends BB when able to tolerate. Will likely need to start as outpatient. Blood culture shows coagulase-negative in 1 bottle likely contaminant.   Moderate pericardial effusion Unknown etiology but in setting of lung cancer. Possibly contributing to dyspnea. Missed PET-CT yesterday and has appointment for next week.   Dyspnea on exertion Unknown etiology. Has been progressive for the past several months.  Likely secondary to underlying lung cancer, atrial fibrillation, pericardial effusion. -PT eval pending. Encouraged patient to work with PT. Occupational Therapy and seen the patient and recommend home  health OT on discharge.  Hypothyroidism TSH of 26.3. Patient reports being very adherent with outpatient regimen. -Dose of Synthroid was increased to 137.5 mcg from 125 mcg.  Need to monitor TSH as outpatient since the patient will also be on amiodarone.  Essential hypertension On lisinopril on prior to admission which is on hold secondary to hypotension. Blood pressure has improved but is still borderline low.  Patient will benefit from low-dose beta-blockers instead of lisinopril on discharge.  Will hold off antihypertensive until outpatient visit.  Lung cancer Recently diagnosed. Patient follows with [facility]. Plan for outpatient PET scan next week..   Anemia Normocytic anemia. In setting of underlying cancer.  Ferritin 235.  Serum iron slightly low.  Hyperlipidemia -Continue pravastatin   DVT prophylaxis: Eliquis  Code Status: Full code  Family Communication: Spoke with the patient at bedside.  Disposition Plan: Home with home health on discharge likely tomorrow.  Pending PT evaluation today.  Seen by cardiology today will follow recommendations of amiodarone on discharge.  Will request home health PT OT and DME   Consultants:   Cardiology  Procedures:   12/27: Transthoracic Echocardiogram  IMPRESSIONS   1. Left ventricular ejection fraction, by visual estimation, is 60 to 65%. The left ventricle has normal function. There is no left ventricular hypertrophy.  2. The left ventricle has no regional wall motion abnormalities.  3. Global right ventricle has normal systolic function.The right ventricular size is normal. No increase in right ventricular wall thickness.  4. Left atrial size was mildly dilated.  5. Right atrial size was normal.  6. Moderate pericardial effusion.  7. The pericardial effusion is anterior to the right ventricle.  8. Mild mitral annular calcification.  9. The mitral valve is normal in structure. No evidence of mitral valve  regurgitation. No evidence of mitral stenosis. 10. The tricuspid valve is normal in structure. 11. The aortic valve is normal in structure. Aortic valve regurgitation is not visualized. Mild aortic valve sclerosis without stenosis. 12. The pulmonic valve was normal in structure. Pulmonic valve regurgitation is not visualized. 13. The inferior vena cava is normal in size with greater than 50% respiratory variability, suggesting right atrial pressure of 3 mmHg. 14. No prior Echocardiogram.  Antimicrobials:  Vancomycin> dc 12/30  Cefepime > dc 12/30   Subjective: Today, patient states that he feels better today.  Mildly dyspneic on exertion.  Able to lie flat.  No dizziness, chest pain, nausea or vomiting.  Lives by himself at home.  Objective: Vitals:   04/19/19 2034 04/20/19 0553 04/20/19 0824 04/20/19 1139  BP: 117/66 (!) 123/52 127/60 (!) 101/57  Pulse: 73 62 63 (!) 140  Resp: 18 19 18  (!) 28  Temp: 98.6 F (37 C) 97.9 F (36.6 C) 98 F (36.7 C) 98 F (36.7 C)  TempSrc: Oral Oral Oral Oral  SpO2: 96% 94% 97% 99%  Weight:      Height:        Intake/Output Summary (Last 24 hours) at 04/20/2019 1409 Last data filed at 04/20/2019 1052 Gross per 24 hour  Intake 222 ml  Output 700 ml  Net -478 ml   Filed Weights   04/16/19 0444  Weight: 70.2 kg   Body mass index is 23.53 kg/m.   Examination: General:  Average built, not in obvious distress, lying flat in bed HENT: Normocephalic, pupils equally reacting to light and accommodation.  No scleral pallor or icterus noted. Oral mucosa is moist.  Chest:  Clear breath sounds.  Diminished breath sounds bilaterally. No crackles or wheezes.  CVS: S1 &S2 heard. No murmur.  Regular rate and rhythm. Abdomen: Soft, nontender, nondistended.  Bowel sounds are heard.  Liver is not palpable, no abdominal mass palpated Extremities: No cyanosis, clubbing or edema.  Peripheral pulses are palpable. Psych: Alert, awake and oriented, normal  mood CNS:  No cranial nerve deficits.  Power equal in all extremities.  No sensory deficits noted.  No cerebellar signs.   Skin: Warm and dry.  No rashes noted.   Data Reviewed: I have personally reviewed following labs and imaging studies  CBC: Recent Labs  Lab 04/15/19 2227 04/15/19 2230 04/16/19 0638 04/18/19 1043 04/19/19 0330 04/20/19 0240  WBC 6.8  --  4.4 5.0 5.3 5.1  NEUTROABS 5.7  --   --   --   --   --   HGB 10.3* 9.9* 9.9* 10.4* 10.8* 10.1*  HCT 32.1* 29.0* 30.9* 32.0* 32.9* 30.6*  MCV 94.7  --  93.9 92.8 93.5 93.0  PLT 210  --  204 237 259 175   Basic Metabolic Panel: Recent Labs  Lab 04/15/19 2227 04/15/19 2230 04/16/19 0638 04/18/19 0323 04/19/19 0330 04/20/19 0240  NA 139 138 138 135 134* 135  K 3.8 3.8 4.0 4.2 4.0 3.6  CL 110 106 108 105 105 105  CO2 22  --  21* 22 21* 22  GLUCOSE 130* 126* 161* 97 98 109*  BUN 15 16 16 13 14 15   CREATININE 1.15 1.10 1.21 1.11 1.15 1.13  CALCIUM 9.2  --  9.6 10.0 9.7 9.5  MG  --   --   --   --   --  1.7   GFR: Estimated  Creatinine Clearance: 52.1 mL/min (by C-G formula based on SCr of 1.13 mg/dL). Liver Function Tests: Recent Labs  Lab 04/15/19 2242 04/20/19 0240  AST 16 19  ALT 14 14  ALKPHOS 87 79  BILITOT 0.4 0.5  PROT 5.6* 5.2*  ALBUMIN 2.7* 2.5*   Recent Labs  Lab 04/15/19 2242  LIPASE 40   No results for input(s): AMMONIA in the last 168 hours. Coagulation Profile: Recent Labs  Lab 04/16/19 0043  INR 1.2   Cardiac Enzymes: No results for input(s): CKTOTAL, CKMB, CKMBINDEX, TROPONINI in the last 168 hours. BNP (last 3 results) No results for input(s): PROBNP in the last 8760 hours. HbA1C: No results for input(s): HGBA1C in the last 72 hours. CBG: Recent Labs  Lab 04/17/19 1544  GLUCAP 84   Lipid Profile: No results for input(s): CHOL, HDL, LDLCALC, TRIG, CHOLHDL, LDLDIRECT in the last 72 hours. Thyroid Function Tests: No results for input(s): TSH, T4TOTAL, FREET4, T3FREE,  THYROIDAB in the last 72 hours. Anemia Panel: No results for input(s): VITAMINB12, FOLATE, FERRITIN, TIBC, IRON, RETICCTPCT in the last 72 hours. Sepsis Labs: Recent Labs  Lab 04/15/19 2245 04/16/19 0045 04/16/19 0220 04/17/19 0308 04/18/19 0323  PROCALCITON  --   --  <0.10 <0.10 <0.10  LATICACIDVEN 1.7 1.3  --   --   --     Recent Results (from the past 240 hour(s))  Urine culture     Status: None   Collection Time: 04/15/19 11:17 PM   Specimen: Urine, Catheterized  Result Value Ref Range Status   Specimen Description URINE, CATHETERIZED  Final   Special Requests NONE  Final   Culture   Final    NO GROWTH Performed at [facility], [address], [place], [place] [address]    Report Status 04/17/2019 FINAL  Final  Blood culture (routine x 2)     Status: Abnormal   Collection Time: 04/16/19  1:10 AM   Specimen: BLOOD RIGHT ARM  Result Value Ref Range Status   Specimen Description BLOOD RIGHT ARM  Final   Special Requests   Final    BOTTLES DRAWN AEROBIC AND ANAEROBIC Blood Culture adequate volume   Culture  Setup Time   Final    AEROBIC BOTTLE ONLY GRAM POSITIVE COCCI CRITICAL RESULT CALLED TO, READ BACK BY AND VERIFIED WITH: KAREN AMEND @0206  ON 04/17/19 BY ROBINSON Z.     Culture (A)  Final    STAPHYLOCOCCUS SPECIES (COAGULASE NEGATIVE) THE SIGNIFICANCE OF ISOLATING THIS ORGANISM FROM A SINGLE SET OF BLOOD CULTURES WHEN MULTIPLE SETS ARE DRAWN IS UNCERTAIN. PLEASE NOTIFY THE MICROBIOLOGY DEPARTMENT WITHIN ONE WEEK IF SPECIATION AND SENSITIVITIES ARE REQUIRED. Performed at [facility], [place] [address] [place], [place] [address]    Report Status 04/18/2019 FINAL  Final  Respiratory Panel by RT PCR (Flu A&B, Covid) - Nasopharyngeal Swab     Status: None   Collection Time: 04/16/19  2:01 AM   Specimen: Nasopharyngeal Swab  Result Value Ref Range Status   SARS Coronavirus 2 by RT PCR NEGATIVE NEGATIVE Final    Comment: (NOTE) SARS-CoV-2 target nucleic  acids are NOT DETECTED. The SARS-CoV-2 RNA is generally detectable in upper respiratoy specimens during the acute phase of infection. The lowest concentration of SARS-CoV-2 viral copies this assay can detect is 131 copies/mL. A negative result does not preclude SARS-Cov-2 infection and should not be used as the sole basis for treatment or other patient management decisions. A negative result may occur  with  improper specimen collection/handling, submission of specimen other than nasopharyngeal swab, presence of viral mutation(s) within the areas targeted by this assay, and inadequate number of viral copies (<131 copies/mL). A negative result must be combined with clinical observations, patient history, and epidemiological information. The expected result is Negative. Fact Sheet for Patients:  PinkCheek.be Fact Sheet for Healthcare Providers:  GravelBags.it This test is not yet ap proved or cleared by the [place] FDA and  has been authorized for detection and/or diagnosis of SARS-CoV-2 by FDA under an Emergency Use Authorization (EUA). This EUA will remain  in effect (meaning this test can be used) for the duration of the COVID-19 declaration under Section 564(b)(1) of the Act, 21 U.S.C. section 360bbb-3(b)(1), unless the authorization is terminated or revoked sooner.    Influenza A by PCR NEGATIVE NEGATIVE Final   Influenza B by PCR NEGATIVE NEGATIVE Final    Comment: (NOTE) The Xpert Xpress SARS-CoV-2/FLU/RSV assay is intended as an aid in  the diagnosis of influenza from Nasopharyngeal swab specimens and  should not be used as a sole basis for treatment. Nasal washings and  aspirates are unacceptable for Xpert Xpress SARS-CoV-2/FLU/RSV  testing. Fact Sheet for Patients: PinkCheek.be Fact Sheet for Healthcare Providers: GravelBags.it This test is not yet  approved or cleared by the [place] FDA and  has been authorized for detection and/or diagnosis of SARS-CoV-2 by  FDA under an Emergency Use Authorization (EUA). This EUA will remain  in effect (meaning this test can be used) for the duration of the  Covid-19 declaration under Section 564(b)(1) of the Act, 21  U.S.C. section 360bbb-3(b)(1), unless the authorization is  terminated or revoked. Performed at [facility], [place] [address], [place], [place] [address]   Blood culture (routine x 2)     Status: None (Preliminary result)   Collection Time: 04/16/19  3:14 AM   Specimen: BLOOD  Result Value Ref Range Status   Specimen Description BLOOD LEFT ANTECUBITAL  Final   Special Requests   Final    BOTTLES DRAWN AEROBIC AND ANAEROBIC Blood Culture adequate volume   Culture   Final    NO GROWTH 4 DAYS Performed at [facility], [place] [address] [place], [place] [address]    Report Status PENDING  Incomplete      Radiology Studies: No results found.  Scheduled Meds: . amiodarone  200 mg Oral BID  . apixaban  5 mg Oral BID  . calcium-vitamin D  1 tablet Oral BID WC  . levothyroxine  137.5 mcg Oral Q0600  . loratadine  10 mg Oral Daily  . pantoprazole  40 mg Oral BID  . polyethylene glycol  17 g Oral Daily  . polyvinyl alcohol  1 drop Both Eyes BID  . pravastatin  20 mg Oral QHS  . sertraline  50 mg Oral QHS  . traZODone  50 mg Oral QHS   Continuous Infusions:   LOS: 4 days   Oscar La, MD [facility]
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
## 2019-04-21 LAB — CULTURE, BLOOD (ROUTINE X 2)
Culture: NO GROWTH
Special Requests: ADEQUATE

## 2019-04-21 MED ORDER — POLYETHYLENE GLYCOL 3350 17 G PO PACK: 17.0000 g | PACK | Freq: Every day | ORAL | 0 refills | Status: AC

## 2019-04-21 MED ORDER — AMIODARONE HCL 100 MG PO TABS: ORAL_TABLET | ORAL | 2 refills | Status: DC

## 2019-04-21 MED ORDER — APIXABAN 5 MG PO TABS: 5.0000 mg | ORAL_TABLET | Freq: Two times a day (BID) | ORAL | 0 refills | Status: AC

## 2019-04-21 MED ORDER — AMIODARONE HCL 100 MG PO TABS: ORAL_TABLET | ORAL | 0 refills | Status: DC

## 2019-04-21 MED ORDER — POLYETHYLENE GLYCOL 3350 17 G PO PACK: 17.0000 g | PACK | Freq: Every day | ORAL | 0 refills | Status: DC

## 2019-04-21 MED ORDER — APIXABAN 5 MG PO TABS: 5.0000 mg | ORAL_TABLET | Freq: Two times a day (BID) | ORAL | 2 refills | Status: DC

## 2019-04-21 NOTE — Progress Notes (Signed)
PT Cancellation Note  Patient Details Name: Carlos Barrera MRN: [id number] DOB: [birth year]   Cancelled Treatment:    Reason Eval/Treat Not Completed: Patient declined, no reason specified. Pt declines PT session reporting that he is mobilizing well and that breathing is his limitation. PT educates patient on how physical therapy can improve pulmonary function and activity tolerance, pt not accepting of this at this time. Pt reports he is discharging today and will move when he gets to leave. PT will continue to follow up per PT POC.   Zenaida Niece 04/21/2019, 10:50 AM

## 2019-04-21 NOTE — Progress Notes (Signed)
Discharged to home with family office visits in place teaching done  

## 2019-04-21 NOTE — Discharge Summary (Addendum)
Physician Discharge Summary  Carlos Barrera WUJ:[id number] DOB: [birth year] DOA: 04/15/2019  PCP: [facility]  Admit date: 04/15/2019 Discharge date: 04/21/2019  Admitted From: Home  Discharge disposition: Home with [facility]  Recommendations for Outpatient Follow-Up:   Follow up with your primary care provider, cardiology as outpatient.  Discharge Diagnosis:   Principal Problem:   Atrial fibrillation with RVR (HCC) Active Problems:   Lung cancer ([place])   New onset atrial fibrillation (HCC)   Hypotension   Discharge Condition: Improved.  Diet recommendation: Low sodium, heart healthy.   Wound care: None.  Code status: Full.   History of Present Illness:   Carlos Barrera is a 79 y.o. male with medical history significant of HTN, prostate CA s/p treatment presented to the hospital due to shortness of breath and found to have afib with RVR. Started on heparin drip and cardizem drip and was seen by cardiology. Patient was then transitioned to amiodarone and Eliquis. Complicated by hypotension on arrival.   Hospital Course:  Following conditions were addressed during hospitalization as listed below, follow-up plan in italics,  Paroxysmal Atrial fibrillation with RVR This was new onset, patient was initially on Cardizem drip.   This converted to normal sinus rhythm after switching to amiodarone . Patient did initially present with hypotension and mildly elevated troponin. Cardiology saw the patient and and patient was advised tapering dose of amiodarone PO and Eliquis.   Transthoracic Echocardiogram showed significant  LA enlargement.  Patient will follow up with his cardiologist at [facility] as outpatient and with primary care physician as well.    Hypotension Etiology thought to be cardiogenic. Hold off with betablockers or ACEI for now. Cardiology recommends BB when able to tolerate. Will likely need to start as outpatient. Blood culture shows coagulase-negative in 1  bottle likely contaminant.    Moderate pericardial effusion Unknown etiology but in setting of lung cancer. Possibly contributing to dyspnea. Missed PET-CT due to inpatient hospitalization but has appointment for next week.   He was advised to keep that appointment.   Dyspnea on exertion  progressive for the past several months.  Likely secondary to underlying lung cancer, atrial fibrillation, pericardial effusion. -PT and Occupational Therapy saw the patient and recommended home health PT OT on discharge.    Hypothyroidism TSH of 26.3. Patient reports being very adherent with outpatient regimen.  Need to monitor TSH as outpatient since the patient will also be on amiodarone.  Synthroid dose could be adjusted as outpatient.   Essential hypertension On lisinopril on prior to admission which will be discontinued on discharge secondary to hypotension.   Patient will benefit from low-dose beta-blockers instead of lisinopril if needed for blood pressure. Will hold off antihypertensive until outpatient visit.   Lung cancer Recently diagnosed. Patient follows with [facility]. Plan for outpatient PET scan next week..    Anemia Normocytic anemia. In setting of underlying cancer.  Ferritin 235.  Serum iron slightly low.  Hemoglobin prior to discharge was 10.1   Hyperlipidemia -Continue pravastatin  Disposition.  At this time, patient is stable for disposition home.  Patient does have appointment to follow-up with cardiology as outpatient, echocardiogram and with his PCP.  Medical Consultants:   cardiology  Procedures:    12/27: Transthoracic Echocardiogram   IMPRESSIONS     1. Left ventricular ejection fraction, by visual estimation, is 60 to 65%. The left ventricle has normal function. There is no left ventricular hypertrophy.  2. The left ventricle has no regional  wall motion abnormalities.  3. Global right ventricle has normal systolic function.The right ventricular size is  normal. No increase in right ventricular wall thickness.  4. Left atrial size was mildly dilated.  5. Right atrial size was normal.  6. Moderate pericardial effusion.  7. The pericardial effusion is anterior to the right ventricle.  8. Mild mitral annular calcification.  9. The mitral valve is normal in structure. No evidence of mitral valve regurgitation. No evidence of mitral stenosis. 10. The tricuspid valve is normal in structure. 11. The aortic valve is normal in structure. Aortic valve regurgitation is not visualized. Mild aortic valve sclerosis without stenosis. 12. The pulmonic valve was normal in structure. Pulmonic valve regurgitation is not visualized. 13. The inferior vena cava is normal in size with greater than 50% respiratory variability, suggesting right atrial pressure of 3 mmHg. 14. No prior Echocardiogram.   Subjective:   Today, patient continues to feel better.  Feels better with breathing, appetite has improved.  No chest pain, fever or chills.   Discharge Exam:   Vitals:   04/21/19 0549 04/21/19 0839  BP: (!) 109/45 136/76  Pulse: 62 71  Resp: 17 16  Temp: (!) 97.5 F (36.4 C) 97.9 F (36.6 C)  SpO2: 92% 98%   Vitals:   04/20/19 2110 04/21/19 0107 04/21/19 0549 04/21/19 0839  BP:  117/67 (!) 109/45 136/76  Pulse:  74 62 71  Resp: 19 17 17 16   Temp:  97.9 F (36.6 C) (!) 97.5 F (36.4 C) 97.9 F (36.6 C)  TempSrc:  Oral Oral Oral  SpO2:  95% 92% 98%  Weight:      Height:       General: Alert awake, not in obvious distress, lying flat in bed HENT: pupils equally reacting to light and accommodation.  No scleral pallor or icterus noted. Oral mucosa is moist.  Chest:   Diminished breath sounds bilaterally. No wheezes CVS: S1 &S2 heard. No murmur.  Regular rate and rhythm. Abdomen: Soft, nontender, nondistended.  Bowel sounds are heard.   Extremities: No cyanosis, clubbing or edema.  Peripheral pulses are palpable. Psych: Alert, awake and oriented,  normal mood CNS:  No cranial nerve deficits.  Power equal in all extremities.   Skin: Warm and dry.  No rashes noted.  The results of significant diagnostics from this hospitalization (including imaging, microbiology, ancillary and laboratory) are listed below for reference.     Diagnostic Studies:   CT Angio Chest PE W and/or Wo Contrast  Result Date: 04/16/2019 CLINICAL DATA:  Shortness of breath EXAM: CT ANGIOGRAPHY CHEST WITH CONTRAST TECHNIQUE: Multidetector CT imaging of the chest was performed using the standard protocol during bolus administration of intravenous contrast. Multiplanar CT image reconstructions and MIPs were obtained to evaluate the vascular anatomy. CONTRAST:  16mL OMNIPAQUE IOHEXOL 350 MG/ML SOLN COMPARISON:  None. FINDINGS: Cardiovascular: There is a optimal opacification of the pulmonary arteries. There is no central,segmental, or subsegmental filling defects within the pulmonary arteries. There is mild cardiomegaly. A small to moderate pericardial effusion is present. Mild scattered aortic atherosclerosis is seen. There is normal three-vessel brachiocephalic anatomy. Mediastinum/Nodes: Subcarinal adenopathy is seen measuring 2.2 cm in AP dimension. There is also a right hilar adenopathy which measures 1.5 cm in AP dimension. Thyroid gland, trachea, and esophagus demonstrate no significant findings. Lungs/Pleura: There is small peripheral patchy airspace opacity seen within the posterior right upper and lower lung. There is also tree-in-bud opacity seen in the periphery of the right lung  base. There is a small right and trace left pleural effusion. Mildly increased interstitial thickening seen within both upper lungs. Upper Abdomen: No acute abnormalities present in the visualized portions of the upper abdomen. Musculoskeletal: No chest wall abnormality. No acute or significant osseous findings. Review of the MIP images confirms the above findings. IMPRESSION: No central,  segmental, or subsegmental pulmonary embolism. Small to moderate pericardial effusion Subcarinal and right hilar adenopathy which could be reactive. Patchy/ground-glass opacity in the posterior right lung which could be due to atelectasis and/or infectious etiology. Small right and trace left pleural effusion. Mildly increased interstitial thickening seen within both upper lungs likely due to mild interstitial edema. Aortic Atherosclerosis (ICD10-I70.0). Electronically Signed   By: Prudencio Pair M.D.   On: 04/16/2019 00:04   DG Chest Portable 1 View  Result Date: 04/16/2019 CLINICAL DATA:  Shortness of breath EXAM: PORTABLE CHEST 1 VIEW COMPARISON:  None. FINDINGS: The heart size and mediastinal contours are within normal limits. There is mildly increased interstitial markings seen throughout both lungs. There is patchy airspace opacity at the periphery of the right lung base. The visualized skeletal structures are unremarkable. IMPRESSION: Mildly increased interstitial markings throughout both lungs with patchy airspace opacity in the right lung base. This could be due to asymmetric edema and/or infectious etiology. Electronically Signed   By: Prudencio Pair M.D.   On: 04/16/2019 00:39   ECHOCARDIOGRAM COMPLETE  Result Date: 04/16/2019   ECHOCARDIOGRAM REPORT   Patient Name:   GOTTFRIED STANDISH Date of Exam: 04/16/2019 Medical Rec #:  [id number]       Height:       68.0 in Accession #:    [id number]      Weight:       154.8 lb Date of Birth:  08-29-1940       BSA:          1.83 m Patient Age:    89 years        BP:           105/54 mmHg Patient Gender: M               HR:           74 bpm. Exam Location:  Inpatient Procedure: 2D Echo, Color Doppler and Cardiac Doppler Indications:    Atrial fibrillation  History:        Patient has no prior history of Echocardiogram examinations.                 Lung and prostate cancer.  Sonographer:    Merrie Roof RDCS Referring Phys: [place]   1. Left ventricular ejection fraction, by visual estimation, is 60 to 65%. The left ventricle has normal function. There is no left ventricular hypertrophy.  2. The left ventricle has no regional wall motion abnormalities.  3. Global right ventricle has normal systolic function.The right ventricular size is normal. No increase in right ventricular wall thickness.  4. Left atrial size was mildly dilated.  5. Right atrial size was normal.  6. Moderate pericardial effusion.  7. The pericardial effusion is anterior to the right ventricle.  8. Mild mitral annular calcification.  9. The mitral valve is normal in structure. No evidence of mitral valve regurgitation. No evidence of mitral stenosis. 10. The tricuspid valve is normal in structure. 11. The aortic valve is normal in structure. Aortic valve regurgitation is not visualized. Mild aortic valve sclerosis without stenosis. 12. The pulmonic valve  was normal in structure. Pulmonic valve regurgitation is not visualized. 13. The inferior vena cava is normal in size with greater than 50% respiratory variability, suggesting right atrial pressure of 3 mmHg. 14. No prior Echocardiogram. FINDINGS  Left Ventricle: Left ventricular ejection fraction, by visual estimation, is 60 to 65%. The left ventricle has normal function. The left ventricle has no regional wall motion abnormalities. There is no left ventricular hypertrophy. Left ventricular diastolic parameters were normal. Normal left atrial pressure. Right Ventricle: The right ventricular size is normal. No increase in right ventricular wall thickness. Global RV systolic function is has normal systolic function. Left Atrium: Left atrial size was mildly dilated. Right Atrium: Right atrial size was normal in size Pericardium: A moderately sized pericardial effusion is present. The pericardial effusion is anterior to the right ventricle. There is no evidence of cardiac tamponade. Mitral Valve: The mitral valve is normal in  structure. Mild mitral annular calcification. No evidence of mitral valve regurgitation. No evidence of mitral valve stenosis by observation. Tricuspid Valve: The tricuspid valve is normal in structure. Tricuspid valve regurgitation is not demonstrated. Aortic Valve: The aortic valve is normal in structure. Aortic valve regurgitation is not visualized. Mild aortic valve sclerosis is present, with no evidence of aortic valve stenosis. Pulmonic Valve: The pulmonic valve was normal in structure. Pulmonic valve regurgitation is not visualized. Pulmonic regurgitation is not visualized. Aorta: The aortic root, ascending aorta and aortic arch are all structurally normal, with no evidence of dilitation or obstruction. Venous: The inferior vena cava was not well visualized. The inferior vena cava is normal in size with greater than 50% respiratory variability, suggesting right atrial pressure of 3 mmHg. IAS/Shunts: No atrial level shunt detected by color flow Doppler. There is no evidence of a patent foramen ovale. No ventricular septal defect is seen or detected. There is no evidence of an atrial septal defect.  LEFT VENTRICLE PLAX 2D LVIDd:         4.35 cm       Diastology LVIDs:         2.82 cm       LV e' lateral:   7.83 cm/s LV PW:         1.13 cm       LV E/e' lateral: 8.7 LV IVS:        1.04 cm       LV e' medial:    8.38 cm/s LVOT diam:     1.90 cm       LV E/e' medial:  8.1 LV SV:         55 ml LV SV Index:   30.05 LVOT Area:     2.84 cm  LV Volumes (MOD) LV area d, A4C:    24.20 cm LV area s, A4C:    12.20 cm LV major d, A4C:   7.25 cm LV major s, A4C:   5.94 cm LV vol d, MOD A4C: 66.1 ml LV vol s, MOD A4C: 20.9 ml LV SV MOD A4C:     66.1 ml RIGHT VENTRICLE RV Basal diam:  3.54 cm LEFT ATRIUM             Index       RIGHT ATRIUM           Index LA diam:        4.20 cm 2.29 cm/m  RA Area:     12.80 cm LA Vol (A2C):   42.1 ml 22.97 ml/m RA Volume:  27.60 ml  15.06 ml/m LA Vol (A4C):   51.6 ml 28.15 ml/m LA  Biplane Vol: 50.0 ml 27.28 ml/m  AORTIC VALVE LVOT Vmax:   71.10 cm/s LVOT Vmean:  47.800 cm/s LVOT VTI:    0.138 m  AORTA Ao Root diam: 3.60 cm Ao Asc diam:  3.60 cm MITRAL VALVE MV Area (PHT): 4.29 cm             SHUNTS MV PHT:        51.33 msec           Systemic VTI:  0.14 m MV Decel Time: 177 msec             Systemic Diam: 1.90 cm MV E velocity: 68.10 cm/s 103 cm/s MV A velocity: 56.10 cm/s 70.3 cm/s MV E/A ratio:  1.21       1.5  Mihai Croitoru MD Electronically signed by Sanda Klein MD Signature Date/Time: 04/16/2019/5:15:08 PM    Final      Labs:   Basic Metabolic Panel: Recent Labs  Lab 04/15/19 2227 04/15/19 2230 04/16/19 6568 04/18/19 0323 04/19/19 0330 04/20/19 0240  NA 139 138 138 135 134* 135  K 3.8 3.8 4.0 4.2 4.0 3.6  CL 110 106 108 105 105 105  CO2 22  --  21* 22 21* 22  GLUCOSE 130* 126* 161* 97 98 109*  BUN 15 16 16 13 14 15   CREATININE 1.15 1.10 1.21 1.11 1.15 1.13  CALCIUM 9.2  --  9.6 10.0 9.7 9.5  MG  --   --   --   --   --  1.7   GFR Estimated Creatinine Clearance: 52.1 mL/min (by C-G formula based on SCr of 1.13 mg/dL). Liver Function Tests: Recent Labs  Lab 04/15/19 2242 04/20/19 0240  AST 16 19  ALT 14 14  ALKPHOS 87 79  BILITOT 0.4 0.5  PROT 5.6* 5.2*  ALBUMIN 2.7* 2.5*   Recent Labs  Lab 04/15/19 2242  LIPASE 40   No results for input(s): AMMONIA in the last 168 hours. Coagulation profile Recent Labs  Lab 04/16/19 0043  INR 1.2    CBC: Recent Labs  Lab 04/15/19 2227 04/15/19 2230 04/16/19 0638 04/18/19 1043 04/19/19 0330 04/20/19 0240  WBC 6.8  --  4.4 5.0 5.3 5.1  NEUTROABS 5.7  --   --   --   --   --   HGB 10.3* 9.9* 9.9* 10.4* 10.8* 10.1*  HCT 32.1* 29.0* 30.9* 32.0* 32.9* 30.6*  MCV 94.7  --  93.9 92.8 93.5 93.0  PLT 210  --  204 237 259 264   Cardiac Enzymes: No results for input(s): CKTOTAL, CKMB, CKMBINDEX, TROPONINI in the last 168 hours. BNP: Invalid input(s): POCBNP CBG: Recent Labs  Lab  04/17/19 1544  GLUCAP 84   D-Dimer No results for input(s): DDIMER in the last 72 hours. Hgb A1c No results for input(s): HGBA1C in the last 72 hours. Lipid Profile No results for input(s): CHOL, HDL, LDLCALC, TRIG, CHOLHDL, LDLDIRECT in the last 72 hours. Thyroid function studies No results for input(s): TSH, T4TOTAL, T3FREE, THYROIDAB in the last 72 hours.  Invalid input(s): FREET3 Anemia work up No results for input(s): VITAMINB12, FOLATE, FERRITIN, TIBC, IRON, RETICCTPCT in the last 72 hours. Microbiology Recent Results (from the past 240 hour(s))  Urine culture     Status: None   Collection Time: 04/15/19 11:17 PM   Specimen: Urine, Catheterized  Result Value Ref Range Status   Specimen  Description URINE, CATHETERIZED  Final   Special Requests NONE  Final   Culture   Final    NO GROWTH Performed at [facility], [place] [address] [place], [place] [address]    Report Status 04/17/2019 FINAL  Final  Blood culture (routine x 2)     Status: Abnormal   Collection Time: 04/16/19  1:10 AM   Specimen: BLOOD RIGHT ARM  Result Value Ref Range Status   Specimen Description BLOOD RIGHT ARM  Final   Special Requests   Final    BOTTLES DRAWN AEROBIC AND ANAEROBIC Blood Culture adequate volume   Culture  Setup Time   Final    AEROBIC BOTTLE ONLY GRAM POSITIVE COCCI CRITICAL RESULT CALLED TO, READ BACK BY AND VERIFIED WITH: KAREN AMEND @0206  ON 04/17/19 BY ROBINSON Z.     Culture (A)  Final    STAPHYLOCOCCUS SPECIES (COAGULASE NEGATIVE) THE SIGNIFICANCE OF ISOLATING THIS ORGANISM FROM A SINGLE SET OF BLOOD CULTURES WHEN MULTIPLE SETS ARE DRAWN IS UNCERTAIN. PLEASE NOTIFY THE MICROBIOLOGY DEPARTMENT WITHIN ONE WEEK IF SPECIATION AND SENSITIVITIES ARE REQUIRED. Performed at [facility], [place] [address], [place],  [address]    Report Status 04/18/2019 FINAL  Final  Respiratory Panel by RT PCR (Flu A&B, Covid) - Nasopharyngeal Swab     Status: None   Collection Time:  04/16/19  2:01 AM   Specimen: Nasopharyngeal Swab  Result Value Ref Range Status   SARS Coronavirus 2 by RT PCR NEGATIVE NEGATIVE Final    Comment: (NOTE) SARS-CoV-2 target nucleic acids are NOT DETECTED. The SARS-CoV-2 RNA is generally detectable in upper respiratoy specimens during the acute phase of infection. The lowest concentration of SARS-CoV-2 viral copies this assay can detect is 131 copies/mL. A negative result does not preclude SARS-Cov-2 infection and should not be used as the sole basis for treatment or other patient management decisions. A negative result may occur with  improper specimen collection/handling, submission of specimen other than nasopharyngeal swab, presence of viral mutation(s) within the areas targeted by this assay, and inadequate number of viral copies (<131 copies/mL). A negative result must be combined with clinical observations, patient history, and epidemiological information. The expected result is Negative. Fact Sheet for Patients:  PinkCheek.be Fact Sheet for Healthcare Providers:  GravelBags.it This test is not yet ap proved or cleared by the [place] FDA and  has been authorized for detection and/or diagnosis of SARS-CoV-2 by FDA under an Emergency Use Authorization (EUA). This EUA will remain  in effect (meaning this test can be used) for the duration of the COVID-19 declaration under Section 564(b)(1) of the Act, 21 U.S.C. section 360bbb-3(b)(1), unless the authorization is terminated or revoked sooner.    Influenza A by PCR NEGATIVE NEGATIVE Final   Influenza B by PCR NEGATIVE NEGATIVE Final    Comment: (NOTE) The Xpert Xpress SARS-CoV-2/FLU/RSV assay is intended as an aid in  the diagnosis of influenza from Nasopharyngeal swab specimens and  should not be used as a sole basis for treatment. Nasal washings and  aspirates are unacceptable for Xpert Xpress SARS-CoV-2/FLU/RSV   testing. Fact Sheet for Patients: PinkCheek.be Fact Sheet for Healthcare Providers: GravelBags.it This test is not yet approved or cleared by the [place] FDA and  has been authorized for detection and/or diagnosis of SARS-CoV-2 by  FDA under an Emergency Use Authorization (EUA). This EUA will remain  in effect (meaning this test can be used) for the duration of the  Covid-19  declaration under Section 564(b)(1) of the Act, 21  U.S.C. section 360bbb-3(b)(1), unless the authorization is  terminated or revoked. Performed at [facility], [place] [address], [place], [place] [address]   Blood culture (routine x 2)     Status: None (Preliminary result)   Collection Time: 04/16/19  3:14 AM   Specimen: BLOOD  Result Value Ref Range Status   Specimen Description BLOOD LEFT ANTECUBITAL  Final   Special Requests   Final    BOTTLES DRAWN AEROBIC AND ANAEROBIC Blood Culture adequate volume   Culture   Final    NO GROWTH 4 DAYS Performed at [facility], [place] [address], [place], [place] [address]    Report Status PENDING  Incomplete     Discharge Instructions:   Discharge Instructions     Amb referral to AFIB Clinic   Complete by: As directed    Call MD for:   Complete by: As directed    Worsening symptoms, bleeding   Diet - low sodium heart healthy   Complete by: As directed    Discharge instructions   Complete by: As directed    Follow-up with your primary care provider in1 week.  Follow-up with cardiology in [place] in 2-4 weeks. Please Keep the appointment for echocardiogram scheduled at [facility].  Do not take your lisinopril until seen by your cardiologist. Do not take meloxicam, aspirin with blood thinner.   Increase activity slowly   Complete by: As directed       Allergies as of 04/21/2019       Reactions   Bee Venom Anaphylaxis   Feldene [piroxicam] Hives        Medication List     STOP taking  these medications    aspirin 81 MG chewable tablet   lisinopril 10 MG tablet Commonly known as: ZESTRIL   Meloxicam 15 MG Tbdp       TAKE these medications    amiodarone 100 MG tablet Commonly known as: Pacerone Take 2 tablets (200 mg total) by mouth 2 (two) times daily for 7 days, THEN 2 tablets (200 mg total) daily. Start taking on: April 21, 2019   apixaban 5 MG Tabs tablet Commonly known as: ELIQUIS Take 1 tablet (5 mg total) by mouth 2 (two) times daily.   Calcium Carbonate-Vitamin D 600-400 MG-UNIT tablet Take 1 tablet by mouth 2 (two) times daily.   cetirizine 10 MG tablet Commonly known as: ZYRTEC Take 10 mg by mouth daily.   guaifenesin 400 MG Tabs tablet Commonly known as: HUMIBID E Take 400 mg by mouth 3 (three) times daily.   levothyroxine 125 MCG tablet Commonly known as: SYNTHROID Take 125 mcg by mouth daily before breakfast.   omeprazole 20 MG capsule Commonly known as: PRILOSEC Take 20 mg by mouth 2 (two) times daily as needed (acid reflux/indigestion.).   polyethylene glycol 17 g packet Commonly known as: MIRALAX / GLYCOLAX Take 17 g by mouth daily.   pravastatin 20 MG tablet Commonly known as: PRAVACHOL Take 20 mg by mouth at bedtime.   prazosin 5 MG capsule Commonly known as: MINIPRESS Take 10 mg by mouth at bedtime.   Refresh Tears 0.5 % Soln Generic drug: carboxymethylcellulose Place 1 drop into both eyes 2 (two) times daily.   sertraline 50 MG tablet Commonly known as: ZOLOFT Take 50 mg by mouth at bedtime.   tamsulosin 0.4 MG Caps capsule Commonly known as: FLOMAX Take 0.4 mg by mouth daily.   traZODone  50 MG tablet Commonly known as: DESYREL Take 50 mg by mouth at bedtime.               Durable Medical Equipment  (From admission, onward)           Start     Ordered   04/20/19 1422  For home use only DME 3 n 1  Once     04/20/19 1421           Follow-up Information     Kate Sable, MD. Call.    Specialties: Cardiology, Radiology Why: You have a hospital follow-up visit scheduled for 05/08/2019 at 9:40am. Please arrive 15 minutes early for check-in. If this date/time does not work for you, please call our office to reschedule. Contact information: [place] [address] [phone]         [place] Follow up.   Why: 3n1 arranged- to be delivered to room prior to discharge            Time coordinating discharge: 39 minutes  Signed:  Dawsen Krieger  [facility] 04/21/2019, 10:05 AM

## 2019-04-21 NOTE — Progress Notes (Addendum)
RN confirmed with patient he has a ride home for today's discharge, no transportation arrangement needed at this time.   PTAR forms placed on hard chart with phone number to call should barriers occur with ride.   CSW provided patient with Eliquis 30 day free card. No further needs identified at this time.   [place], [place]

## 2019-04-22 ENCOUNTER — Emergency Department: Payer: Medicare Other

## 2019-04-22 ENCOUNTER — Other Ambulatory Visit: Payer: Self-pay

## 2019-04-22 ENCOUNTER — Encounter: Payer: Self-pay | Admitting: Emergency Medicine

## 2019-04-22 ENCOUNTER — Observation Stay
Admission: EM | Admit: 2019-04-22 | Discharge: 2019-04-23 | Disposition: A | Discharge status: Home / Self Care | Payer: Medicare Other | Attending: Family Medicine | Admitting: Family Medicine

## 2019-04-22 DIAGNOSIS — R112 Nausea with vomiting, unspecified: Secondary | ICD-10-CM

## 2019-04-22 DIAGNOSIS — I480 Paroxysmal atrial fibrillation: Secondary | ICD-10-CM | POA: Diagnosis not present

## 2019-04-22 DIAGNOSIS — Z96652 Presence of left artificial knee joint: Secondary | ICD-10-CM | POA: Insufficient documentation

## 2019-04-22 DIAGNOSIS — R778 Other specified abnormalities of plasma proteins: Secondary | ICD-10-CM | POA: Diagnosis not present

## 2019-04-22 DIAGNOSIS — K219 Gastro-esophageal reflux disease without esophagitis: Secondary | ICD-10-CM | POA: Insufficient documentation

## 2019-04-22 DIAGNOSIS — Z85118 Personal history of other malignant neoplasm of bronchus and lung: Secondary | ICD-10-CM | POA: Diagnosis not present

## 2019-04-22 DIAGNOSIS — I4891 Unspecified atrial fibrillation: Secondary | ICD-10-CM | POA: Insufficient documentation

## 2019-04-22 DIAGNOSIS — I10 Essential (primary) hypertension: Secondary | ICD-10-CM | POA: Insufficient documentation

## 2019-04-22 DIAGNOSIS — E785 Hyperlipidemia, unspecified: Secondary | ICD-10-CM | POA: Diagnosis not present

## 2019-04-22 DIAGNOSIS — N400 Enlarged prostate without lower urinary tract symptoms: Secondary | ICD-10-CM | POA: Diagnosis not present

## 2019-04-22 DIAGNOSIS — I200 Unstable angina: Secondary | ICD-10-CM

## 2019-04-22 DIAGNOSIS — Z8546 Personal history of malignant neoplasm of prostate: Secondary | ICD-10-CM | POA: Insufficient documentation

## 2019-04-22 DIAGNOSIS — Z79890 Hormone replacement therapy (postmenopausal): Secondary | ICD-10-CM | POA: Insufficient documentation

## 2019-04-22 DIAGNOSIS — R079 Chest pain, unspecified: Secondary | ICD-10-CM | POA: Diagnosis not present

## 2019-04-22 DIAGNOSIS — Z87891 Personal history of nicotine dependence: Secondary | ICD-10-CM | POA: Insufficient documentation

## 2019-04-22 DIAGNOSIS — E039 Hypothyroidism, unspecified: Secondary | ICD-10-CM | POA: Diagnosis not present

## 2019-04-22 DIAGNOSIS — R1110 Vomiting, unspecified: Secondary | ICD-10-CM

## 2019-04-22 DIAGNOSIS — Z20822 Contact with and (suspected) exposure to covid-19: Secondary | ICD-10-CM | POA: Insufficient documentation

## 2019-04-22 DIAGNOSIS — K5900 Constipation, unspecified: Secondary | ICD-10-CM | POA: Diagnosis not present

## 2019-04-22 DIAGNOSIS — J449 Chronic obstructive pulmonary disease, unspecified: Secondary | ICD-10-CM | POA: Insufficient documentation

## 2019-04-22 DIAGNOSIS — Z79899 Other long term (current) drug therapy: Secondary | ICD-10-CM | POA: Insufficient documentation

## 2019-04-22 LAB — URINALYSIS, COMPLETE (UACMP) WITH MICROSCOPIC
Bacteria, UA: NONE SEEN
Bilirubin Urine: NEGATIVE
Glucose, UA: NEGATIVE mg/dL
Hgb urine dipstick: NEGATIVE
Ketones, ur: NEGATIVE mg/dL
Leukocytes,Ua: NEGATIVE
Nitrite: NEGATIVE
Protein, ur: 30 mg/dL — AB
Specific Gravity, Urine: >1.046 — ABNORMAL HIGH (ref 1.005–1.030)
pH: 5 (ref 5.0–8.0)

## 2019-04-22 LAB — COMPREHENSIVE METABOLIC PANEL
ALT: 17 U/L (ref 0–44)
AST: 21 U/L (ref 15–41)
Albumin: 3.4 g/dL — ABNORMAL LOW (ref 3.5–5.0)
Alkaline Phosphatase: 99 U/L (ref 38–126)
Anion gap: 9 (ref 5–15)
BUN: 16 mg/dL (ref 8–23)
CO2: 26 mmol/L (ref 22–32)
Calcium: 10.6 mg/dL — ABNORMAL HIGH (ref 8.9–10.3)
Chloride: 103 mmol/L (ref 98–111)
Creatinine, Ser: 0.96 mg/dL (ref 0.61–1.24)
GFR calc Af Amer: >60 mL/min (ref >60)
GFR calc non Af Amer: >60 mL/min (ref >60)
Glucose, Bld: 110 mg/dL — ABNORMAL HIGH (ref 70–99)
Potassium: 4 mmol/L (ref 3.5–5.1)
Sodium: 138 mmol/L (ref 135–145)
Total Bilirubin: 0.5 mg/dL (ref 0.3–1.2)
Total Protein: 7.1 g/dL (ref 6.5–8.1)

## 2019-04-22 LAB — CBC
HCT: 35.4 % — ABNORMAL LOW (ref 39.0–52.0)
Hemoglobin: 12 g/dL — ABNORMAL LOW (ref 13.0–17.0)
MCH: 30.3 pg (ref 26.0–34.0)
MCHC: 33.9 g/dL (ref 30.0–36.0)
MCV: 89.4 fL (ref 80.0–100.0)
Platelets: 350 10*3/uL (ref 150–400)
RBC: 3.96 MIL/uL — ABNORMAL LOW (ref 4.22–5.81)
RDW: 15.3 % (ref 11.5–15.5)
WBC: 6 10*3/uL (ref 4.0–10.5)
nRBC: 0 % (ref 0.0–0.2)

## 2019-04-22 LAB — PROTIME-INR
INR: 1.2 (ref 0.8–1.2)
Prothrombin Time: 15 seconds (ref 11.4–15.2)

## 2019-04-22 LAB — LIPASE, BLOOD: Lipase: 38 U/L (ref 11–51)

## 2019-04-22 LAB — POC SARS CORONAVIRUS 2 AG: SARS Coronavirus 2 Ag: NEGATIVE

## 2019-04-22 LAB — TROPONIN I (HIGH SENSITIVITY)
Troponin I (High Sensitivity): 81 ng/L — ABNORMAL HIGH (ref <18)
Troponin I (High Sensitivity): 113 ng/L (ref <18)

## 2019-04-22 LAB — HEPARIN LEVEL (UNFRACTIONATED): Heparin Unfractionated: 1.23 IU/mL — ABNORMAL HIGH (ref 0.30–0.70)

## 2019-04-22 LAB — APTT: aPTT: 29 seconds (ref 24–36)

## 2019-04-22 MED ORDER — PANTOPRAZOLE SODIUM 40 MG IV SOLR
40.0000 mg | Freq: Once | INTRAVENOUS | Status: AC
  Administered 2019-04-22: 40 mg via INTRAVENOUS
  Filled 2019-04-22: qty 40

## 2019-04-22 MED ORDER — LEVOTHYROXINE SODIUM 25 MCG PO TABS
125.0000 ug | ORAL_TABLET | Freq: Every day | ORAL | Status: DC
  Administered 2019-04-23: 125 ug via ORAL
  Filled 2019-04-22: qty 1

## 2019-04-22 MED ORDER — ALUM & MAG HYDROXIDE-SIMETH 200-200-20 MG/5ML PO SUSP: 30.0000 mL | Freq: Once | ORAL | Status: DC

## 2019-04-22 MED ORDER — HEPARIN BOLUS VIA INFUSION
4000.0000 [IU] | Freq: Once | INTRAVENOUS | Status: AC
  Administered 2019-04-22: 4000 [IU] via INTRAVENOUS
  Filled 2019-04-22: qty 4000

## 2019-04-22 MED ORDER — LIDOCAINE VISCOUS HCL 2 % MT SOLN: 15.0000 mL | Freq: Once | OROMUCOSAL | Status: DC

## 2019-04-22 MED ORDER — IOHEXOL 300 MG/ML  SOLN
100.0000 mL | Freq: Once | INTRAMUSCULAR | Status: AC | PRN
  Administered 2019-04-22: 100 mL via INTRAVENOUS

## 2019-04-22 MED ORDER — PRAZOSIN HCL 5 MG PO CAPS
10.0000 mg | ORAL_CAPSULE | Freq: Every day | ORAL | Status: DC
  Filled 2019-04-22: qty 2

## 2019-04-22 MED ORDER — POLYETHYLENE GLYCOL 3350 17 G PO PACK
17.0000 g | PACK | Freq: Every day | ORAL | Status: DC
  Administered 2019-04-23: 17 g via ORAL
  Filled 2019-04-22: qty 1

## 2019-04-22 MED ORDER — ACETAMINOPHEN 325 MG PO TABS: 650.0000 mg | ORAL_TABLET | ORAL | Status: DC | PRN

## 2019-04-22 MED ORDER — LIDOCAINE VISCOUS HCL 2 % MT SOLN
15.0000 mL | Freq: Once | OROMUCOSAL | Status: DC
  Filled 2019-04-22: qty 15

## 2019-04-22 MED ORDER — ALPRAZOLAM 0.5 MG PO TABS: 0.2500 mg | ORAL_TABLET | Freq: Two times a day (BID) | ORAL | Status: DC | PRN

## 2019-04-22 MED ORDER — METOCLOPRAMIDE HCL 5 MG/ML IJ SOLN
10.0000 mg | Freq: Once | INTRAMUSCULAR | Status: AC
  Administered 2019-04-22: 19:00:00 10 mg via INTRAVENOUS
  Filled 2019-04-22: qty 2

## 2019-04-22 MED ORDER — SODIUM CHLORIDE 0.9 % IV BOLUS: 500.0000 mL | Freq: Once | INTRAVENOUS | Status: DC

## 2019-04-22 MED ORDER — LORATADINE 10 MG PO TABS
10.0000 mg | ORAL_TABLET | Freq: Every day | ORAL | Status: DC
  Administered 2019-04-23: 10:00:00 10 mg via ORAL
  Filled 2019-04-22: qty 1

## 2019-04-22 MED ORDER — TAMSULOSIN HCL 0.4 MG PO CAPS
0.4000 mg | ORAL_CAPSULE | Freq: Every day | ORAL | Status: DC
  Administered 2019-04-23: 0.4 mg via ORAL
  Filled 2019-04-22: qty 1

## 2019-04-22 MED ORDER — AMIODARONE HCL 100 MG PO TABS: 100.0000 mg | ORAL_TABLET | ORAL | Status: DC

## 2019-04-22 MED ORDER — ASPIRIN 300 MG RE SUPP
300.0000 mg | Freq: Once | RECTAL | Status: DC
  Filled 2019-04-22: qty 1

## 2019-04-22 MED ORDER — ONDANSETRON HCL 4 MG/2ML IJ SOLN: 4.0000 mg | Freq: Four times a day (QID) | INTRAMUSCULAR | Status: DC | PRN

## 2019-04-22 MED ORDER — GUAIFENESIN ER 600 MG PO TB12
600.0000 mg | ORAL_TABLET | Freq: Two times a day (BID) | ORAL | Status: DC
  Administered 2019-04-23: 600 mg via ORAL
  Filled 2019-04-22: qty 1

## 2019-04-22 MED ORDER — ZOLPIDEM TARTRATE 5 MG PO TABS: 5.0000 mg | ORAL_TABLET | Freq: Every evening | ORAL | Status: DC | PRN

## 2019-04-22 MED ORDER — AMIODARONE HCL 200 MG PO TABS: 200.0000 mg | ORAL_TABLET | Freq: Every day | ORAL | Status: DC

## 2019-04-22 MED ORDER — CLOPIDOGREL BISULFATE 75 MG PO TABS
75.0000 mg | ORAL_TABLET | Freq: Every day | ORAL | Status: DC
  Administered 2019-04-23: 75 mg via ORAL
  Filled 2019-04-22: qty 1

## 2019-04-22 MED ORDER — PANTOPRAZOLE SODIUM 40 MG PO TBEC
40.0000 mg | DELAYED_RELEASE_TABLET | Freq: Every day | ORAL | Status: DC
  Administered 2019-04-23: 40 mg via ORAL
  Filled 2019-04-22: qty 1

## 2019-04-22 MED ORDER — SODIUM CHLORIDE 0.9 % IV SOLN: INTRAVENOUS | Status: DC

## 2019-04-22 MED ORDER — SORBITOL 70 % SOLN
960.0000 mL | TOPICAL_OIL | Freq: Once | ORAL | Status: AC
  Administered 2019-04-22: 960 mL via RECTAL
  Filled 2019-04-22 (×4): qty 473

## 2019-04-22 MED ORDER — APIXABAN 5 MG PO TABS: 5.0000 mg | ORAL_TABLET | Freq: Two times a day (BID) | ORAL | Status: DC

## 2019-04-22 MED ORDER — SUCRALFATE 1 G PO TABS
1.0000 g | ORAL_TABLET | Freq: Once | ORAL | Status: DC
  Filled 2019-04-22: qty 1

## 2019-04-22 MED ORDER — ATORVASTATIN CALCIUM 20 MG PO TABS
20.0000 mg | ORAL_TABLET | Freq: Every day | ORAL | Status: DC
  Administered 2019-04-23: 20 mg via ORAL
  Filled 2019-04-22: qty 1

## 2019-04-22 MED ORDER — POLYVINYL ALCOHOL 1.4 % OP SOLN
1.0000 [drp] | Freq: Two times a day (BID) | OPHTHALMIC | Status: DC
  Administered 2019-04-22 – 2019-04-23 (×2): 1 [drp] via OPHTHALMIC
  Filled 2019-04-22: qty 15

## 2019-04-22 MED ORDER — TRAZODONE HCL 50 MG PO TABS: 50.0000 mg | ORAL_TABLET | Freq: Every day | ORAL | Status: DC

## 2019-04-22 MED ORDER — SERTRALINE HCL 50 MG PO TABS: 50.0000 mg | ORAL_TABLET | Freq: Every day | ORAL | Status: DC

## 2019-04-22 MED ORDER — AMIODARONE HCL 200 MG PO TABS
200.0000 mg | ORAL_TABLET | Freq: Two times a day (BID) | ORAL | Status: DC
  Administered 2019-04-22 – 2019-04-23 (×2): 200 mg via ORAL
  Filled 2019-04-22 (×2): qty 1

## 2019-04-22 MED ORDER — HEPARIN (PORCINE) 25000 UT/250ML-% IV SOLN
850.0000 [IU]/h | INTRAVENOUS | Status: DC
  Administered 2019-04-22: 850 [IU]/h via INTRAVENOUS
  Filled 2019-04-22: qty 250

## 2019-04-22 MED ORDER — ONDANSETRON HCL 4 MG/2ML IJ SOLN
4.0000 mg | Freq: Once | INTRAMUSCULAR | Status: AC
  Administered 2019-04-22: 4 mg via INTRAVENOUS
  Filled 2019-04-22: qty 2

## 2019-04-22 MED ORDER — PRAVASTATIN SODIUM 20 MG PO TABS: 20.0000 mg | ORAL_TABLET | Freq: Every day | ORAL | Status: DC

## 2019-04-22 MED ORDER — ALUM & MAG HYDROXIDE-SIMETH 200-200-20 MG/5ML PO SUSP
30.0000 mL | Freq: Once | ORAL | Status: DC
  Filled 2019-04-22: qty 30

## 2019-04-22 MED ORDER — SODIUM CHLORIDE 0.9 % IV BOLUS
1000.0000 mL | Freq: Once | INTRAVENOUS | Status: AC
  Administered 2019-04-22: 1000 mL via INTRAVENOUS

## 2019-04-22 MED ORDER — CALCIUM CARBONATE-VITAMIN D 500-200 MG-UNIT PO TABS
1.0000 | ORAL_TABLET | Freq: Two times a day (BID) | ORAL | Status: DC
  Filled 2019-04-22 (×2): qty 1

## 2019-04-22 MED ORDER — NITROGLYCERIN 0.4 MG SL SUBL: 0.4000 mg | SUBLINGUAL_TABLET | SUBLINGUAL | Status: DC | PRN

## 2019-04-22 NOTE — ED Notes (Signed)
Attempted to call report at this time- was transferred to RN and on hold for 7 minutes and hung up on. Will call again.

## 2019-04-22 NOTE — ED Notes (Signed)
Pt provided water and basin for fluid challenge. Pt refuses PO meds or food "until this in my stomach moves."

## 2019-04-22 NOTE — ED Notes (Signed)
Pt reports mild relief after enema.

## 2019-04-22 NOTE — ED Notes (Signed)
Assessed pt after po fluids- pt states "I didn't even try." Pt refuses PO challenge at this time.

## 2019-04-22 NOTE — ED Triage Notes (Addendum)
Pt arrived via POV from home with reports of emesis since yesterday after eating.  Pt states he was admitted at [facility] from 04/15/19-04/21/19 for new onset a-fib. Pt started on eliquis and amiodarone.  Pt reports when he came home he ate food and then started vomiting up the food he ate. Pt c/o feeling pressure in epigastric region. Pt concerned for possible obstruction. Pt denies feeling like food stuck in esophagus. Pt reports this morning when he ate breakfast he drank gingerale and vomited again.  Pt c/o feeling a pressure in his chest. Pt states he feels that if he belched he would feel better. Pt had BM 12/31 that he states was hard, pt states that is the first BM he had in 1 week, and has not had one since.  Pt denies any abdominal pain.  Pt goes to [place] in [place].

## 2019-04-22 NOTE — ED Notes (Signed)
Pt assisted to toilet 

## 2019-04-22 NOTE — ED Notes (Signed)
Report given to [facility]. Transportation requested.

## 2019-04-22 NOTE — ED Notes (Signed)
Date and time results received: 04/22/19 2014 (use smartphrase ".now" to insert current time)  Test: troponin Critical Value: 113  Name of Provider Notified: stafford, md

## 2019-04-22 NOTE — ED Notes (Signed)
Pt provided urinal and instructed on use for UA collection. Pt verbalizes understanding.

## 2019-04-22 NOTE — H&P (Addendum)
[place] [facility] [place] NAME: Carlos Barrera    MR#:  [id number]  DATE OF BIRTH:  [birth year]  DATE OF ADMISSION:  04/22/2019  PRIMARY CARE PHYSICIAN: [facility]   REQUESTING/REFERRING PHYSICIAN: Brenton Grills, MD  CHIEF COMPLAINT:   Chief Complaint  Patient presents with   Emesis    HISTORY OF PRESENT ILLNESS:  Carlos Barrera  is a 79 y.o. Caucasian male with a known history of multiple medical problems that were mentioned below, including asthma, dyslipidemia, GERD, hypertension and hypothyroidism, who was recently admitted to [facility] for atrial fibrillation with rapid ventricular response and discharged yesterday.  Since his discharge he stated that he ate dinner and vomited once after drinking water after his meal.  He did better overnight and this morning he ate two thirds of cup of oatmeal and had a drink then vomited again.  He has been experiencing chest fullness that felt like gas.  He admits to chronic dyspnea without worsening.  No worsening cough or wheezing.  No headache or dizziness or blurred vision.  He is constipated denied any diarrhea or abdominal pain.  No headache or dizziness or blurred vision no presyncope or syncope.  No leg pain or edema recent travels or surgeries.  Upon presentation to the emergency room, vital signs were within normal.  Labs revealed CMP that was unremarkable and CBC that showed mild anemia, better than a couple of days ago.  His initial troponin I was 81 and repeat troponin was 113.  He had an elevated troponin of 111 at [facility] as well.  His INR was 1.2 PT 15 and PTT 29.  His COVID-19 antigen ischemic negative and PCR is currently pending.  Urinalysis showed elevated specific gravity 1046 and was otherwise unremarkable.  EKG showed no sinus rhythm with rate of 78.  The patient was given GI cocktail, sublingual nitroglycerin, 4 mg of IV Zofran, 40 mg of IV Protonix, normal saline bolus, 1 g of p.o.  Carafate and smog enema.  Due to persistent discomfort in his chest he was given IV heparin bolus and was started on a drip.  He will be admitted to an observation telemetry bed for further evaluation and management. PAST MEDICAL HISTORY:   Past Medical History:  Diagnosis Date   Agent orange exposure    Asthma    exercise induced asthma    COPD (chronic obstructive pulmonary disease) (HCC)    DJD (degenerative joint disease)    a. 02/2019 s/p L TKA.   GERD (gastroesophageal reflux disease)    Hearing loss    wears hearing aids   History of cardiac cath    a. History of 2 cardiac catheterizations, last ~ 10 yrs ago @ [facility]-->reportedly nl.   History of stress test    a. 2020 - pharmacologic stress testing @ [facility]-->reportedly nl.   Hyperlipidemia    Hypertension    Hypothyroidism    Insomnia    Lung cancer ([place])    a. Dx 03/2019.   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   TOTAL KNEE ARTHROPLASTY Left 03/09/2019   Procedure: LEFT TOTAL KNEE ARTHROPLASTY;  Surgeon: Meredith Pel, MD;  Location: [facility];  Service: Orthopedics;  Laterality: Left;   WISDOM TOOTH EXTRACTION      SOCIAL HISTORY:   Social History   Tobacco Use   Smoking status: Former Smoker    Packs/day: 2.00    Years: 20.00    Pack years: 40.00    Types: Cigarettes    Quit date: 04/20/1980    Years since quitting: 39.0   Smokeless tobacco: Never Used  Substance Use Topics   Alcohol use: No    FAMILY HISTORY:   Family History  Problem Relation Age of Onset   Breast cancer Mother        w/ brain  mets -> died in her 68's.   Heart attack Father        died @ 55   Other Brother        multiple medical issues, pt unaware of specifics.    DRUG ALLERGIES:   Allergies  Allergen Reactions   Bee Venom Anaphylaxis   Feldene [Piroxicam] Hives    REVIEW OF SYSTEMS:   ROS As per history of present illness. All pertinent systems were reviewed above. Constitutional,  HEENT, cardiovascular, respiratory, GI, GU, musculoskeletal, neuro, psychiatric, endocrine,  integumentary and hematologic systems were reviewed and are otherwise  negative/unremarkable except for positive findings mentioned above in the HPI.   MEDICATIONS AT HOME:   Prior to Admission medications   Medication Sig Start Date End Date Taking? Authorizing Provider  amiodarone (PACERONE) 100 MG tablet Take 2 tablets (200 mg total) by mouth 2 (two) times daily for 7 days, THEN 2 tablets (200 mg total) daily. 04/21/19 05/28/19  Pokhrel, Corrie Mckusick, MD  apixaban (ELIQUIS) 5 MG TABS tablet Take 1 tablet (5 mg total) by mouth 2 (two) times daily. 04/21/19   Pokhrel, Corrie Mckusick, MD  Calcium Carbonate-Vitamin D 600-400 MG-UNIT tablet Take 1 tablet by mouth 2 (two) times daily.     [provider]  cetirizine (ZYRTEC) 10 MG tablet Take 10 mg by mouth daily.  02/13/18   [provider]  guaifenesin (HUMIBID E) 400 MG TABS tablet Take 400 mg by mouth 3 (three) times daily.    [provider]  levothyroxine (SYNTHROID) 125 MCG tablet Take 125 mcg by mouth daily before breakfast.    [provider]  omeprazole (PRILOSEC) 20 MG capsule Take 20 mg by mouth 2 (two) times daily as needed (acid reflux/indigestion.).  10/17/18   [provider]  polyethylene glycol (MIRALAX / GLYCOLAX) 17 g packet Take 17 g by mouth daily. 04/21/19   Pokhrel, Corrie Mckusick, MD  pravastatin (PRAVACHOL) 20 MG tablet Take 20 mg by mouth at bedtime.    [provider]  prazosin (MINIPRESS) 5 MG capsule Take 10 mg by mouth at  bedtime.  02/21/18   [provider]  REFRESH TEARS 0.5 % SOLN Place 1 drop into both eyes 2 (two) times daily.  02/13/18   [provider]  sertraline (ZOLOFT) 50 MG tablet Take 50 mg by mouth at bedtime.  12/14/18   [provider]  tamsulosin (FLOMAX) 0.4 MG CAPS capsule Take 0.4 mg by mouth daily.  01/06/19   [provider]  traZODone (DESYREL) 50 MG tablet Take 50 mg by mouth at bedtime.    [provider]      VITAL SIGNS:  Blood pressure 128/68, pulse 76, temperature 98.2 F (36.8 C), temperature source Oral, resp. rate 18, height 5\' 8"  (1.727 m), weight 70.2 kg, SpO2 96 %.  PHYSICAL EXAMINATION:  Physical Exam  GENERAL:  79 y.o.-year-old Caucasian male patient lying in the bed with no acute distress.  EYES: Pupils equal, round, reactive to light and accommodation. No scleral icterus. Extraocular muscles intact.  HEENT: Head atraumatic, normocephalic. Oropharynx and nasopharynx clear.  NECK:  Supple, no jugular venous distention. No thyroid enlargement, no tenderness.  LUNGS: Normal breath sounds bilaterally, no wheezing, rales,rhonchi or crepitation. No use of accessory muscles of respiration.  CARDIOVASCULAR: Regular rate and rhythm, S1, S2 normal. No murmurs, rubs, or gallops.  ABDOMEN: Soft, nondistended, nontender. Bowel sounds present. No organomegaly or mass.  EXTREMITIES: No pedal edema, cyanosis, or clubbing.  NEUROLOGIC: Cranial nerves II through XII are intact. Muscle strength 5/5 in all extremities. Sensation intact. Gait not checked.  PSYCHIATRIC: The patient is alert and oriented x 3.  Normal affect and good eye contact. SKIN: No obvious rash, lesion, or ulcer.   LABORATORY PANEL:   CBC Recent Labs  Lab 04/22/19 1233  WBC 6.0  HGB 12.0*  HCT 35.4*  PLT 350   ------------------------------------------------------------------------------------------------------------------  Chemistries  Recent Labs  Lab  04/20/19 0240 04/22/19 1233  NA 135 138  K 3.6 4.0  CL 105 103  CO2 22 26  GLUCOSE 109* 110*  BUN 15 16  CREATININE 1.13 0.96  CALCIUM 9.5 10.6*  MG 1.7  --   AST 19 21  ALT 14 17  ALKPHOS 79 99  BILITOT 0.5 0.5   ------------------------------------------------------------------------------------------------------------------  Cardiac Enzymes No results for input(s): TROPONINI in the last 168 hours. ------------------------------------------------------------------------------------------------------------------  RADIOLOGY:  CT abd  Result Date: 04/22/2019 CLINICAL DATA:  Emesis since yesterday. New onset of atrial fibrillation. On blood thinners. Sensation of food stuck in esophagus. Lung and prostate cancer. EXAM: CT ABDOMEN AND PELVIS WITH CONTRAST TECHNIQUE: Multidetector CT imaging of the abdomen and pelvis was performed using the standard protocol following bolus administration of intravenous contrast. CONTRAST:  135mL OMNIPAQUE IOHEXOL 300 MG/ML  SOLN COMPARISON:  Plain film of the abdomen of earlier today. FINDINGS: Lower chest: Mild right base subsegmental atelectasis. Normal heart size with minimal pericardial fluid. Small right pleural effusion. Hepatobiliary: Normal liver. Normal gallbladder, without biliary ductal dilatation. Pancreas: Normal, without mass or ductal dilatation. Spleen: Normal in size, without focal abnormality. Adrenals/Urinary Tract: Normal left adrenal gland. Felt to arise exophytic off the right adrenal is a 2.1 x 1.5 cm nodule including on 29/2. When compared to the CT of the chest of 04/15/2019, this is low-density on that study, favoring an adenoma. Normal kidneys, without hydronephrosis.  Normal urinary bladder. Stomach/Bowel: Normal stomach, without wall thickening. Colonic stool burden suggests constipation. Normal terminal ileum and appendix. Normal small bowel. Vascular/Lymphatic: Aortic and branch vessel atherosclerosis. No abdominopelvic  adenopathy. Reproductive: Normal prostate. Other: No significant free fluid. Small bilateral fat containing inguinal hernias. Musculoskeletal: Lumbar spondylosis. Prior interbody fusion material at L4-5. IMPRESSION: 1. No acute process in the abdomen or pelvis. 2. Possible constipation. 3. Small right pleural effusion. 4. Right adrenal adenoma. 5. Aortic Atherosclerosis (ICD10-I70.0). 6. Small pericardial effusion. Electronically Signed   By: Abigail Miyamoto M.D.   On: 04/22/2019 16:50   DG Abd 2 Views  Result Date: 04/22/2019 CLINICAL DATA:  Abdominal pain EXAM: ABDOMEN - 2 VIEW COMPARISON:  None. FINDINGS: The bowel gas  pattern is normal. Moderate volume of stool throughout the colon. There is no evidence of free air. No radio-opaque calculi or other significant radiographic abnormality is seen. IMPRESSION: Nonobstructive bowel gas pattern with moderate volume of stool throughout the colon. Electronically Signed   By: Davina Poke D.O.   On: 04/22/2019 13:30      IMPRESSION AND PLAN:   1. Chest pain/fullness, rule out acute coronary syndrome.  The patient will be admitted to an observation telemetry bed.  Will follow serial cardiac enzymes and EKGs.  We will continue the patient for now on IV heparin for the possibility of unstable angina.  We will hold off Eliquis while on heparin and place him on Plavix. We will obtain a cardiology consult in a.m. for further cardiac risk stratification.  The patient will be placed on aspirin as well as p.r.n. sublingual nitroglycerin and morphine sulfate for pain.  I notified Dr. Caryl Comes about the patient.  2.  Recent atrial fibrillation with rapid ventricular response, status post cardioversion on amiodarone.  We will continue amiodarone and Eliquis is being temporarily held off while the patient is on heparin.  It can certainly be resumed upon discharge.  3.  Hypothyroidism we will check TSH and continue Synthroid.  4.  Dyslipidemia.  We will continue statin  therapy.  We will check fasting lipids in a.m.  5.  GERD.  We will continue PPI therapy.  6.  BPH.  Continue Flomax.  7.  DVT prophylaxis.  The patient will be on therapeutic IV heparin.    All the records are reviewed and case discussed with ED provider. The plan of care was discussed in details with the patient (and family). I answered all questions. The patient agreed to proceed with the above mentioned plan. Further management will depend upon hospital course.   CODE STATUS: Full code  TOTAL TIME TAKING CARE OF THIS PATIENT: 55 minutes.    Christel Mormon M.D on 04/22/2019 at 8:46 PM  [facility]   From 7 PM-7 AM, contact night-coverage [email or web address]  CC: Primary care physician; [facility]   Note: This dictation was prepared with Dragon dictation along with smaller phrase technology. Any transcriptional errors that result from this process are unintentional.

## 2019-04-22 NOTE — ED Provider Notes (Signed)
[facility] Emergency Department Provider Note  ____________________________________________  Time seen: Approximately 7:34 PM  I have reviewed the triage vital signs and the nursing notes.   HISTORY  Chief Complaint Emesis    HPI Carlos Barrera is a 79 y.o. male with a history of COPD GERD hypertension lung cancer  who comes the ED complaining of vomiting since yesterday not tolerating any food or fluid.  This is associated with a feeling of chest "fullness," denies shortness of breath fever chills or abdominal pain.  Chest discomfort is nonradiating.  No diaphoresis.  He was recently hospitalized for new onset of atrial fibrillation with RVR, started on medications and discharged home yesterday.  Since returning home these new symptoms have been constant, not able to tolerate oral intake.  He denies dizziness.  Symptoms are constant, worse with trying to swallow anything, no alleviating factors.  He denies a sensation of food stuck in the throat.  Reports that the sensation started before trying to eat and did not happen suddenly while swallowing anything.  Denies a history of esophageal stricture.     Past Medical History:  Diagnosis Date  . Agent orange exposure   . Asthma    exercise induced asthma   . COPD (chronic obstructive pulmonary disease) ([place])   . DJD (degenerative joint disease)    a. 02/2019 s/p L TKA.  Carlos Barrera GERD (gastroesophageal reflux disease)   . Hearing loss    wears hearing aids  . History of cardiac cath    a. History of 2 cardiac catheterizations, last ~ 10 yrs ago @ [facility]-->reportedly nl.  . History of stress test    a. 2020 - pharmacologic stress testing @ [facility]-->reportedly nl.  . Hyperlipidemia   . Hypertension   . Hypothyroidism   . Insomnia   . Lung cancer ([place])    a. Dx 03/2019.  Carlos Barrera Prostate cancer ([place])   . PTSD (post-traumatic stress disorder)   . Seasonal allergies   . Thyroid disease      Patient Active Problem  List   Diagnosis Date Noted  . Lung cancer ([place]) 04/16/2019  . New onset atrial fibrillation ([place]) 04/16/2019  . Atrial fibrillation with RVR ([place]) 04/16/2019  . Hypotension 04/16/2019  . Arthritis of left knee 03/09/2019     Past Surgical History:  Procedure Laterality Date  . BACK SURGERY     x 2  . COLONOSCOPY     hx polyps  . HERNIA REPAIR    . INCISE AND DRAIN ABCESS     patient denies this procedure 03/03/19  . NECK SURGERY     x 1  . TONSILLECTOMY    . 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 TOTAL KNEE ARTHROPLASTY Left 03/09/2019   Procedure: LEFT TOTAL KNEE ARTHROPLASTY;  Surgeon: Meredith Pel, MD;  Location: [facility];  Service: Orthopedics;  Laterality: Left;  . WISDOM TOOTH EXTRACTION       Prior to Admission medications   Medication Sig Start Date End Date Taking? Authorizing Provider  amiodarone (PACERONE) 100 MG tablet Take 2 tablets (200 mg total) by mouth 2 (two) times daily for 7 days, THEN 2 tablets (200 mg total) daily. 04/21/19 05/28/19  Pokhrel, Corrie Mckusick, MD  apixaban (ELIQUIS) 5 MG TABS tablet Take 1 tablet (5 mg total) by mouth 2 (two) times daily. 04/21/19   Pokhrel, Corrie Mckusick, MD  Calcium Carbonate-Vitamin D 600-400 MG-UNIT tablet Take 1 tablet by mouth 2 (two) times daily.  [provider]  cetirizine (ZYRTEC) 10 MG tablet Take 10 mg by mouth daily.  02/13/18   [provider]  guaifenesin (HUMIBID E) 400 MG TABS tablet Take 400 mg by mouth 3 (three) times daily.    [provider]  levothyroxine (SYNTHROID) 125 MCG tablet Take 125 mcg by mouth daily before breakfast.    [provider]  omeprazole (PRILOSEC) 20 MG capsule Take 20 mg by mouth 2 (two) times daily as needed (acid reflux/indigestion.).  10/17/18   [provider]  polyethylene glycol (MIRALAX / GLYCOLAX) 17 g packet Take 17 g by mouth daily. 04/21/19   Pokhrel, Corrie Mckusick, MD  pravastatin (PRAVACHOL) 20 MG tablet Take 20 mg by mouth at bedtime.     [provider]  prazosin (MINIPRESS) 5 MG capsule Take 10 mg by mouth at bedtime.  02/21/18   [provider]  REFRESH TEARS 0.5 % SOLN Place 1 drop into both eyes 2 (two) times daily.  02/13/18   [provider]  sertraline (ZOLOFT) 50 MG tablet Take 50 mg by mouth at bedtime.  12/14/18   [provider]  tamsulosin (FLOMAX) 0.4 MG CAPS capsule Take 0.4 mg by mouth daily.  01/06/19   [provider]  traZODone (DESYREL) 50 MG tablet Take 50 mg by mouth at bedtime.    [provider]     Allergies Bee venom and Feldene [piroxicam]   Family History  Problem Relation Age of Onset  . Breast cancer Mother        w/ brain mets -> died in her 21's.  Carlos Barrera Heart attack Father        died @ 87  . Other Brother        multiple medical issues, pt unaware of specifics.    Social History Social History   Tobacco Use  . Smoking status: Former Smoker    Packs/day: 2.00    Years: 20.00    Pack years: 40.00    Types: Cigarettes    Quit date: 04/20/1980    Years since quitting: 39.0  . Smokeless tobacco: Never Used  Substance Use Topics  . Alcohol use: No  . Drug use: No    Review of Systems  Constitutional:   No fever or chills.  ENT:   No sore throat. No rhinorrhea. Cardiovascular:   Positive chest discomfort without syncope. Respiratory:   No dyspnea or cough. Gastrointestinal:   Negative for abdominal pain, positive vomiting.  Positive constipation Musculoskeletal:   Negative for focal pain or swelling All other systems reviewed and are negative except as documented above in ROS and HPI.  ____________________________________________   PHYSICAL EXAM:  VITAL SIGNS: ED Triage Vitals  Enc Vitals Group     BP 04/22/19 1215 119/64     Pulse Rate 04/22/19 1215 77     Resp 04/22/19 1215 18     Temp 04/22/19 1215 98.2 F (36.8 C)     Temp Source 04/22/19 1215 Oral     SpO2 04/22/19 1215 96 %     Weight 04/22/19 1216 154 lb 12.2  oz (70.2 kg)     Height 04/22/19 1216 5\' 8"  (1.727 m)     Head Circumference --      Peak Flow --      Pain Score 04/22/19 1216 3     Pain Loc --      Pain Edu? --      Excl. in [place]? --  Vital signs reviewed, nursing assessments reviewed.   Constitutional:   Alert and oriented. Non-toxic appearance. Eyes:   Conjunctivae are normal. EOMI. PERRL. ENT      Head:   Normocephalic and atraumatic.      Nose:   Wearing a mask.      Mouth/Throat:   Wearing a mask.      Neck:   No meningismus. Full ROM. Hematological/Lymphatic/Immunilogical:   No cervical lymphadenopathy. Cardiovascular:   RRR. Symmetric bilateral radial and DP pulses.  No murmurs. Cap refill less than 2 seconds. Respiratory:   Normal respiratory effort without tachypnea/retractions. Breath sounds are clear and equal bilaterally. No wheezes/rales/rhonchi. Gastrointestinal:   Soft with epigastric tenderness. Non distended. There is no CVA tenderness.  No rebound, rigidity, or guarding.  Musculoskeletal:   Normal range of motion in all extremities. No joint effusions.  No lower extremity tenderness.  No edema. Neurologic:   Normal speech and language.  Motor grossly intact. No acute focal neurologic deficits are appreciated.  Skin:    Skin is warm, dry and intact. No rash noted.  No petechiae, purpura, or bullae.  ____________________________________________    LABS (pertinent positives/negatives) (all labs ordered are listed, but only abnormal results are displayed) Labs Reviewed  COMPREHENSIVE METABOLIC PANEL - Abnormal; Notable for the following components:      Result Value   Glucose, Bld 110 (*)    Calcium 10.6 (*)    Albumin 3.4 (*)    All other components within normal limits  CBC - Abnormal; Notable for the following components:   RBC 3.96 (*)    Hemoglobin 12.0 (*)    HCT 35.4 (*)    All other components within normal limits  URINALYSIS, COMPLETE (UACMP) WITH MICROSCOPIC - Abnormal; Notable for the  following components:   Color, Urine YELLOW (*)    APPearance CLEAR (*)    Specific Gravity, Urine >1.046 (*)    Protein, ur 30 (*)    All other components within normal limits  TROPONIN I (HIGH SENSITIVITY) - Abnormal; Notable for the following components:   Troponin I (High Sensitivity) 81 (*)    All other components within normal limits  TROPONIN I (HIGH SENSITIVITY) - Abnormal; Notable for the following components:   Troponin I (High Sensitivity) 113 (*)    All other components within normal limits  SARS CORONAVIRUS 2 (TAT 6-24 HRS)  LIPASE, BLOOD  HEPARIN LEVEL (UNFRACTIONATED)  APTT  PROTIME-INR  POC SARS CORONAVIRUS 2 AG -  ED  POC SARS CORONAVIRUS 2 AG   ____________________________________________   EKG  Interpreted by me  Date: 04/22/2019  Rate: 78  Rhythm: normal sinus rhythm  QRS Axis: normal  Intervals: normal  ST/T Wave abnormalities: normal  Conduction Disutrbances: none  Narrative Interpretation: unremarkable      ____________________________________________    RADIOLOGY  CT abd  Result Date: 04/22/2019 CLINICAL DATA:  Emesis since yesterday. New onset of atrial fibrillation. On blood thinners. Sensation of food stuck in esophagus. Lung and prostate cancer. EXAM: CT ABDOMEN AND PELVIS WITH CONTRAST TECHNIQUE: Multidetector CT imaging of the abdomen and pelvis was performed using the standard protocol following bolus administration of intravenous contrast. CONTRAST:  128mL OMNIPAQUE IOHEXOL 300 MG/ML  SOLN COMPARISON:  Plain film of the abdomen of earlier today. FINDINGS: Lower chest: Mild right base subsegmental atelectasis. Normal heart size with minimal pericardial fluid. Small right pleural effusion. Hepatobiliary: Normal liver. Normal gallbladder, without biliary ductal dilatation. Pancreas: Normal, without mass or ductal dilatation. Spleen: Normal  in size, without focal abnormality. Adrenals/Urinary Tract: Normal left adrenal gland. Felt to arise  exophytic off the right adrenal is a 2.1 x 1.5 cm nodule including on 29/2. When compared to the CT of the chest of 04/15/2019, this is low-density on that study, favoring an adenoma. Normal kidneys, without hydronephrosis.  Normal urinary bladder. Stomach/Bowel: Normal stomach, without wall thickening. Colonic stool burden suggests constipation. Normal terminal ileum and appendix. Normal small bowel. Vascular/Lymphatic: Aortic and branch vessel atherosclerosis. No abdominopelvic adenopathy. Reproductive: Normal prostate. Other: No significant free fluid. Small bilateral fat containing inguinal hernias. Musculoskeletal: Lumbar spondylosis. Prior interbody fusion material at L4-5. IMPRESSION: 1. No acute process in the abdomen or pelvis. 2. Possible constipation. 3. Small right pleural effusion. 4. Right adrenal adenoma. 5. Aortic Atherosclerosis (ICD10-I70.0). 6. Small pericardial effusion. Electronically Signed   By: Abigail Miyamoto M.D.   On: 04/22/2019 16:50   DG Abd 2 Views  Result Date: 04/22/2019 CLINICAL DATA:  Abdominal pain EXAM: ABDOMEN - 2 VIEW COMPARISON:  None. FINDINGS: The bowel gas pattern is normal. Moderate volume of stool throughout the colon. There is no evidence of free air. No radio-opaque calculi or other significant radiographic abnormality is seen. IMPRESSION: Nonobstructive bowel gas pattern with moderate volume of stool throughout the colon. Electronically Signed   By: Davina Poke D.O.   On: 04/22/2019 13:30    ____________________________________________   PROCEDURES .Critical Care Performed by: Carrie Mew, MD Authorized by: Carrie Mew, MD   Critical care provider statement:    Critical care time (minutes):  35   Critical care time was exclusive of:  Separately billable procedures and treating other patients   Critical care was necessary to treat or prevent imminent or life-threatening deterioration of the following conditions:  Cardiac failure   Critical  care was time spent personally by me on the following activities:  Development of treatment plan with patient or surrogate, discussions with consultants, evaluation of patient's response to treatment, examination of patient, obtaining history from patient or surrogate, ordering and performing treatments and interventions, ordering and review of laboratory studies, ordering and review of radiographic studies, pulse oximetry, re-evaluation of patient's condition and review of old charts    ____________________________________________  DIFFERENTIAL DIAGNOSIS   Bowel obstruction, perforation, intra-abdominal abscess, tumor, pancreatitis, gastritis, esophageal stricture, esophageal foreign body, non-STEMI, Covid  CLINICAL IMPRESSION / ASSESSMENT AND PLAN / ED COURSE  Medications ordered in the ED: Medications  alum & mag hydroxide-simeth (MAALOX/MYLANTA) 200-200-20 MG/5ML suspension 30 mL (30 mLs Oral Refused 04/22/19 1909)  sucralfate (CARAFATE) tablet 1 g (1 g Oral Refused 04/22/19 1909)  lidocaine (XYLOCAINE) 2 % viscous mouth solution 15 mL (15 mLs Mouth/Throat Refused 04/22/19 1909)  nitroGLYCERIN (NITROSTAT) SL tablet 0.4 mg (has no administration in time range)  aspirin suppository 300 mg (has no administration in time range)  heparin bolus via infusion 4,000 Units (has no administration in time range)  heparin ADULT infusion 100 units/mL (25000 units/261mL sodium chloride 0.45%) (has no administration in time range)  sodium chloride 0.9 % bolus 1,000 mL (0 mLs Intravenous Stopped 04/22/19 1822)  ondansetron (ZOFRAN) injection 4 mg (4 mg Intravenous Given 04/22/19 1645)  pantoprazole (PROTONIX) injection 40 mg (40 mg Intravenous Given 04/22/19 1645)  iohexol (OMNIPAQUE) 300 MG/ML solution 100 mL (100 mLs Intravenous Contrast Given 04/22/19 1632)  metoCLOPramide (REGLAN) injection 10 mg (10 mg Intravenous Given 04/22/19 1909)  sorbitol, milk of mag, mineral oil, glycerin (SMOG) enema (960 mLs Rectal Given  04/22/19 2004)  Pertinent labs & imaging results that were available during my care of the patient were reviewed by me and considered in my medical decision making (see chart for details).  Carlos Barrera was evaluated in Emergency Department on 04/22/2019 for the symptoms described in the history of present illness. He was evaluated in the context of the global COVID-19 pandemic, which necessitated consideration that the patient might be at risk for infection with the SARS-CoV-2 virus that causes COVID-19. Institutional protocols and algorithms that pertain to the evaluation of patients at risk for COVID-19 are in a state of rapid change based on information released by regulatory bodies including the CDC and federal and state organizations. These policies and algorithms were followed during the patient's care in the ED.   Patient presents with chest discomfort, vomiting and intolerance of oral intake.  Recent hospitalization for atrial fibrillation.  Initial labs and abdominal x-ray are unremarkable except for showing signs of constipation on the x-ray.  Point-of-care Covid test negative, CT scan of the abdomen and pelvis overall unremarkable.  Troponin is 81 today, downtrending from recent level of 111 a week ago.   Patient given IV fluids for hydration, Zofran and Protonix IV without improvement of his symptoms.    Presentation is not consistent with ACS but with his persistent symptoms, he may warrant observation for further cardiac work-up.  Patient still feels that he is not able to attempt taking anything by mouth, refuses p.o. meds for an acids.  I will give an enema for his constipation, check a PCR Covid test, follow-up urinalysis, trend troponin.  Clinical Course as of Apr 21 2028  Sat Apr 22, 2019  2001 Repeat troponin uptrending.  Will discuss with hospitalist for further evaluation.  Given his intractable pain, I will need to start heparin and give nitrates for unstable angina.   [PS]     Clinical Course User Index [PS] Carrie Mew, MD     ____________________________________________   FINAL CLINICAL IMPRESSION(S) / ED DIAGNOSES    Final diagnoses:  Nonspecific chest pain  Intractable vomiting with nausea, unspecified vomiting type  Constipation, unspecified constipation type     ED Discharge Orders    None      Portions of this note were generated with dragon dictation software. Dictation errors may occur despite best attempts at proofreading.   Carrie Mew, MD 04/22/19 2030

## 2019-04-22 NOTE — ED Notes (Signed)
SMOG received from lab at this time via tube station.

## 2019-04-22 NOTE — Progress Notes (Signed)
ANTICOAGULATION CONSULT NOTE - Initial Consult  Pharmacy Consult for Heparin  Indication: chest pain/ACS  Allergies  Allergen Reactions  . Bee Venom Anaphylaxis  . Feldene [Piroxicam] Hives    Patient Measurements: Height: 5\' 8"  (172.7 cm) Weight: 154 lb 12.2 oz (70.2 kg) IBW/kg (Calculated) : 68.4 Heparin Dosing Weight:  70.2 kg   Vital Signs: Temp: 98.2 F (36.8 C) (01/02 1215) Temp Source: Oral (01/02 1215) BP: 128/68 (01/02 1900) Pulse Rate: 76 (01/02 1915)  Labs: Recent Labs    04/20/19 0240 04/22/19 1233 04/22/19 1916  HGB 10.1* 12.0*  --   HCT 30.6* 35.4*  --   PLT 264 350  --   CREATININE 1.13 0.96  --   TROPONINIHS  --  81* 113*    Estimated Creatinine Clearance: 61.4 mL/min (by C-G formula based on SCr of 0.96 mg/dL).   Medical History: Past Medical History:  Diagnosis Date  . Agent orange exposure   . Asthma    exercise induced asthma   . COPD (chronic obstructive pulmonary disease) ([place])   . DJD (degenerative joint disease)    a. 02/2019 s/p L TKA.  Marland Kitchen GERD (gastroesophageal reflux disease)   . Hearing loss    wears hearing aids  . History of cardiac cath    a. History of 2 cardiac catheterizations, last ~ 10 yrs ago @ [facility]-->reportedly nl.  . History of stress test    a. 2020 - pharmacologic stress testing @ [facility]-->reportedly nl.  . Hyperlipidemia   . Hypertension   . Hypothyroidism   . Insomnia   . Lung cancer ([place])    a. Dx 03/2019.  Marland Kitchen Prostate cancer ([place])   . PTSD (post-traumatic stress disorder)   . Seasonal allergies   . Thyroid disease     Medications:  (Not in a hospital admission)   Assessment: Pharmacy consulted to dose heparin in this 79 year old male admitted with ACS/NSTEMI.  CrCl = 61.4 ml/min Pt was on Eliquis 5 mg PO BID at home, last dose was on 12/31 PM.    Goal of Therapy:  HL = 0.3 - 0.7 APTT = 66 - 102  Plan:  Will order Heparin 4000 units IV X 1 bolus and begin heparin gtt @ 850 units/hr.  Will draw HL  and aPTT 8 hrs after start of drip. Will use aPTT to guide heparin dosing until HL and aPTT converge.   Sheriden Archibeque D 04/22/2019,8:14 PM

## 2019-04-23 DIAGNOSIS — R1110 Vomiting, unspecified: Secondary | ICD-10-CM

## 2019-04-23 DIAGNOSIS — R0789 Other chest pain: Secondary | ICD-10-CM | POA: Diagnosis not present

## 2019-04-23 DIAGNOSIS — I480 Paroxysmal atrial fibrillation: Secondary | ICD-10-CM

## 2019-04-23 DIAGNOSIS — R079 Chest pain, unspecified: Secondary | ICD-10-CM | POA: Diagnosis not present

## 2019-04-23 DIAGNOSIS — R112 Nausea with vomiting, unspecified: Secondary | ICD-10-CM

## 2019-04-23 DIAGNOSIS — I200 Unstable angina: Secondary | ICD-10-CM | POA: Diagnosis not present

## 2019-04-23 DIAGNOSIS — C3490 Malignant neoplasm of unspecified part of unspecified bronchus or lung: Secondary | ICD-10-CM | POA: Diagnosis not present

## 2019-04-23 DIAGNOSIS — E039 Hypothyroidism, unspecified: Secondary | ICD-10-CM

## 2019-04-23 LAB — TROPONIN I (HIGH SENSITIVITY): Troponin I (High Sensitivity): 90 ng/L — ABNORMAL HIGH (ref <18)

## 2019-04-23 LAB — APTT
aPTT: 92 seconds — ABNORMAL HIGH (ref 24–36)
aPTT: 76 seconds — ABNORMAL HIGH (ref 24–36)

## 2019-04-23 LAB — HEPARIN LEVEL (UNFRACTIONATED): Heparin Unfractionated: 1.38 IU/mL — ABNORMAL HIGH (ref 0.30–0.70)

## 2019-04-23 LAB — SARS CORONAVIRUS 2 (TAT 6-24 HRS): SARS Coronavirus 2: NEGATIVE

## 2019-04-23 LAB — TSH: TSH: 37.147 u[IU]/mL — ABNORMAL HIGH (ref 0.350–4.500)

## 2019-04-23 MED ORDER — ONDANSETRON 4 MG PO TBDP: 4.0000 mg | ORAL_TABLET | Freq: Three times a day (TID) | ORAL | 0 refills | Status: AC | PRN

## 2019-04-23 MED ORDER — ALUM & MAG HYDROXIDE-SIMETH 200-200-20 MG/5ML PO SUSP: 30.0000 mL | Freq: Four times a day (QID) | ORAL | 0 refills | Status: AC | PRN

## 2019-04-23 NOTE — Plan of Care (Signed)
  Problem: Education: Goal: Knowledge of General Education information will improve Description: Including pain rating scale, medication(s)/side effects and non-pharmacologic comfort measures Outcome: Progressing   Problem: Health Behavior/Discharge Planning: Goal: Ability to manage health-related needs will improve Outcome: Progressing   Problem: Clinical Measurements: Goal: Ability to maintain clinical measurements within normal limits will improve Outcome: Not Progressing Note: Most recent troponin > 100. Cardiology following. Will continue to monitor cardiac status for the remainder of the shift. Wenda Low [facility]

## 2019-04-23 NOTE — Discharge Summary (Signed)
Physician Discharge Summary  Patient ID: Carlos Barrera MRN: [id number] DOB/AGE: Apr 17, 1941 79 y.o.  Admit date: 04/22/2019 Discharge date: 04/23/2019  Admission Diagnoses:  Discharge Diagnoses:  Active Problems:   Chest pain   Discharged Condition: fair  Hospital Course:  Carlos Barrera  is a 79 y.o. Caucasian male with a known history of multiple medical problems that were mentioned below, including asthma, dyslipidemia, GERD, hypertension and hypothyroidism, who was recently admitted to [facility] for atrial fibrillation with rapid ventricular response and discharged yesterday.  Since his discharge he stated that he ate dinner and vomited once after drinking water after his meal.   The patient was given GI cocktail, sublingual nitroglycerin, 4 mg of IV Zofran, 40 mg of IV Protonix, normal saline bolus, 1 g of p.o. Carafate and smog enema.  Due to persistent discomfort in his chest he was given IV heparin bolus and was started on a drip.  He was admitted to an observation telemetry bed for further evaluation and management.  1. Chest pain/fullness, rule out acute coronary syndrome: Chest pain Resolved - Likely demand ischemia causing trop elevation - Trop then trended down  - Was on IV heparin for the possibility of unstable angina; d/c'ed  -Cardiology consulted. Cleared to discharge with close follow-up with patient's cardiologist, patient already has an appointment with his cardiologist on January 18. Strongly encouraged to follow-up.  2.  Recent atrial fibrillation with rapid ventricular response, status post cardioversion on amiodarone.   -Currently rate and rhythm control  -Resume amiodarone as previously instructed -Resume Eliquis   -TSH found to be 37.14; she is currently taking levothyroxine  3.  Hypothyroidism: 37.1 TSH and continue Synthroid. -Advised follow-up outpatient PCP and endocrinologist  4.  Dyslipidemia.  We will continue statin therapy   5.  GERD.   We will continue PPI therapy. -Also given Maalox since it helps patient  6.  BPH.  Continue Flomax.  Consults: cardiology  Significant Diagnostic Studies: Radiology and Blood work   Treatments: as per hospital course and D/C medlist   Discharge Exam: Blood pressure 124/65, pulse 70, temperature 98.1 F (36.7 C), temperature source Oral, resp. rate 18, height 5\' 8"  (1.727 m), weight 71.2 kg, SpO2 94 %.  General:  Well nourished, well developed, in no acute distress  HEENT: normal Lymph: no adenopathy Neck: no JVD Endocrine:  No thryomegaly Vascular: No carotid bruits; FA pulses 2+ bilaterally without bruits  Cardiac:  normal S1, S2; RRR; no murmur   Lungs:  clear to auscultation bilaterally, no wheezing, rhonchi or rales  Abd: soft, nontender, no hepatomegaly  Intermittent epigastric discomfort  Ext: no edema Musculoskeletal:  No deformities, BUE and BLE strength normal and equal Skin: warm and dry  Neuro:  CNs 2-12 intact, no focal abnormalities noted Psych:  Normal affect  Disposition: Discharge disposition: 01-Home or Self Care     Stable to be discharged home with close follow-up with patient's PCP, and cardiologist. Warning signs and symptoms explained to patient when he must seek immediate medical attention. Patient expressed understanding -Patient tolerated diet well without any nausea vomiting.  Discharge Instructions    Call MD for:  difficulty breathing, headache or visual disturbances   Complete by: As directed    Diet - low sodium heart healthy   Complete by: As directed    Increase activity slowly   Complete by: As directed      Allergies as of 04/23/2019      Reactions   Bee Venom  Anaphylaxis   Feldene [piroxicam] Hives      Medication List    TAKE these medications   alum & mag hydroxide-simeth 200-200-20 MG/5ML suspension Commonly known as: MAALOX/MYLANTA Take 30 mLs by mouth every 6 (six) hours as needed for indigestion or heartburn.    amiodarone 100 MG tablet Commonly known as: Pacerone Take 2 tablets (200 mg total) by mouth 2 (two) times daily for 7 days, THEN 2 tablets (200 mg total) daily. Start taking on: April 21, 2019   apixaban 5 MG Tabs tablet Commonly known as: ELIQUIS Take 1 tablet (5 mg total) by mouth 2 (two) times daily.   Calcium Carbonate-Vitamin D 600-400 MG-UNIT tablet Take 1 tablet by mouth 2 (two) times daily.   cetirizine 10 MG tablet Commonly known as: ZYRTEC Take 10 mg by mouth daily.   guaifenesin 400 MG Tabs tablet Commonly known as: HUMIBID E Take 400 mg by mouth 3 (three) times daily.   levothyroxine 125 MCG tablet Commonly known as: SYNTHROID Take 125 mcg by mouth daily before breakfast.   omeprazole 20 MG capsule Commonly known as: PRILOSEC Take 20 mg by mouth 2 (two) times daily as needed (acid reflux/indigestion.).   ondansetron 4 MG disintegrating tablet Commonly known as: Zofran ODT Take 1 tablet (4 mg total) by mouth every 8 (eight) hours as needed for nausea or vomiting.   polyethylene glycol 17 g packet Commonly known as: MIRALAX / GLYCOLAX Take 17 g by mouth daily.   pravastatin 20 MG tablet Commonly known as: PRAVACHOL Take 20 mg by mouth at bedtime.   prazosin 5 MG capsule Commonly known as: MINIPRESS Take 10 mg by mouth at bedtime.   Refresh Tears 0.5 % Soln Generic drug: carboxymethylcellulose Place 1 drop into both eyes 2 (two) times daily.   sertraline 50 MG tablet Commonly known as: ZOLOFT Take 50 mg by mouth at bedtime.   tamsulosin 0.4 MG Caps capsule Commonly known as: FLOMAX Take 0.4 mg by mouth daily.   traZODone 50 MG tablet Commonly known as: DESYREL Take 50 mg by mouth at bedtime.      Follow-up Information    Clinic, [place]. Schedule an appointment as soon as possible for a visit in 1 week(s).   Contact information: [address] [facility] [place] [address] [phone]        Dr. Waldron Session (Pt's  Cardiologist) Follow up in 2 week(s).   Why: Pt has an appt already          Signed: Thornell Mule 04/23/2019, 4:08 PM

## 2019-04-23 NOTE — Progress Notes (Signed)
ANTICOAGULATION CONSULT NOTE - Initial Consult  Pharmacy Consult for Heparin  Indication: chest pain/ACS  Allergies  Allergen Reactions  . Bee Venom Anaphylaxis  . Feldene [Piroxicam] Hives    Patient Measurements: Height: 5\' 8"  (172.7 cm) Weight: 156 lb 15.5 oz (71.2 kg) IBW/kg (Calculated) : 68.4 Heparin Dosing Weight:  70.2 kg   Vital Signs: Temp: 98.4 F (36.9 C) (01/03 0729) Temp Source: Oral (01/03 0729) BP: 129/68 (01/03 0729) Pulse Rate: 63 (01/03 0729)  Labs: Recent Labs    04/22/19 1233 04/22/19 1916 04/22/19 2107 04/23/19 0615  HGB 12.0*  --   --   --   HCT 35.4*  --   --   --   PLT 350  --   --   --   APTT  --   --  29 92*  LABPROT  --   --  15.0  --   INR  --   --  1.2  --   HEPARINUNFRC  --   --  1.23* 1.38*  CREATININE 0.96  --   --   --   TROPONINIHS 81* 113*  --   --     Estimated Creatinine Clearance: 61.4 mL/min (by C-G formula based on SCr of 0.96 mg/dL).   Medical History: Past Medical History:  Diagnosis Date  . Agent orange exposure   . Asthma    exercise induced asthma   . COPD (chronic obstructive pulmonary disease) ([place])   . DJD (degenerative joint disease)    a. 02/2019 s/p L TKA.  Marland Kitchen GERD (gastroesophageal reflux disease)   . Hearing loss    wears hearing aids  . History of cardiac cath    a. History of 2 cardiac catheterizations, last ~ 10 yrs ago @ [facility]-->reportedly nl.  . History of stress test    a. 2020 - pharmacologic stress testing @ [facility]-->reportedly nl.  . Hyperlipidemia   . Hypertension   . Hypothyroidism   . Insomnia   . Lung cancer ([place])    a. Dx 03/2019.  Marland Kitchen Prostate cancer ([place])   . PTSD (post-traumatic stress disorder)   . Seasonal allergies   . Thyroid disease     Medications:  Facility-Administered Medications Prior to Admission  Medication Dose Route Frequency Provider Last Rate Last Admin  . Leuprolide Acetate (6 Month) (LUPRON) injection 45 mg  45 mg Intramuscular Once Stoioff, Scott C, MD        Medications Prior to Admission  Medication Sig Dispense Refill Last Dose  . amiodarone (PACERONE) 100 MG tablet Take 2 tablets (200 mg total) by mouth 2 (two) times daily for 7 days, THEN 2 tablets (200 mg total) daily. 88 tablet 0 04/21/2019 at Unknown time  . apixaban (ELIQUIS) 5 MG TABS tablet Take 1 tablet (5 mg total) by mouth 2 (two) times daily. 60 tablet 0 04/21/2019 at Unknown time  . Calcium Carbonate-Vitamin D 600-400 MG-UNIT tablet Take 1 tablet by mouth 2 (two) times daily.    04/21/2019 at Unknown time  . cetirizine (ZYRTEC) 10 MG tablet Take 10 mg by mouth daily.    04/21/2019 at Unknown time  . guaifenesin (HUMIBID E) 400 MG TABS tablet Take 400 mg by mouth 3 (three) times daily.   04/21/2019 at Unknown time  . levothyroxine (SYNTHROID) 125 MCG tablet Take 125 mcg by mouth daily before breakfast.   04/21/2019 at Unknown time  . omeprazole (PRILOSEC) 20 MG capsule Take 20 mg by mouth 2 (two) times daily as needed (  acid reflux/indigestion.).    04/21/2019 at Unknown time  . polyethylene glycol (MIRALAX / GLYCOLAX) 17 g packet Take 17 g by mouth daily. 14 each 0 04/21/2019 at Unknown time  . pravastatin (PRAVACHOL) 20 MG tablet Take 20 mg by mouth at bedtime.   04/21/2019 at Unknown time  . prazosin (MINIPRESS) 5 MG capsule Take 10 mg by mouth at bedtime.    04/21/2019 at Unknown time  . REFRESH TEARS 0.5 % SOLN Place 1 drop into both eyes 2 (two) times daily.    04/21/2019 at Unknown time  . sertraline (ZOLOFT) 50 MG tablet Take 50 mg by mouth at bedtime.    04/21/2019 at Unknown time  . tamsulosin (FLOMAX) 0.4 MG CAPS capsule Take 0.4 mg by mouth daily.    04/21/2019 at Unknown time  . traZODone (DESYREL) 50 MG tablet Take 50 mg by mouth at bedtime.   04/21/2019 at Unknown time    Assessment: Pharmacy consulted to dose heparin in this 79 year old male admitted with ACS/NSTEMI. Pt is also on plavix.   Pt was on Eliquis 5 mg PO BID at home, last dose was on 12/31 PM.    1/3 0615 aPTT 92 HL 1.38   Goal  of Therapy:  HL = 0.3 - 0.7 APTT = 66 - 102  Plan:  APTT is therapeutic. Heparin level seems to be falsely elevated still. Will continue heparin infusion at current rate (850 units/hr). Will order aPTT in 8 hours. Heparin level and CBC will AM labs.  Will use aPTT to guide heparin dosing until HL and aPTT converge.   Oswald Hillock, PharmD, BCPS  04/23/2019,8:04 AM

## 2019-04-23 NOTE — Consult Note (Signed)
Cardiology Consultation:   Patient ID: Carlos Barrera MRN: [id number]; DOB: 1941/04/15  Admit date: 04/22/2019 Date of Consult: 04/23/2019  Primary Care Provider: [facility] Primary Cardiologist:new   Primary Electrophysiologist:  None     Patient Profile:   Carlos Barrera is a 79 y.o. male with a hx of AFib and lung Ca who is being seen today for the evaluation of elevated hsTn  at the request of Dr Sidney Ace  History of Present Illness:   Mr. Orvis admitted 1/2 for vomiting assoc with sensation of food sticking at the lower sternal area-- not noted while in hospital last week, but with drinking the night before admission and with oatmeal the day of.  With this he noted chest discomfort characterized by pressure, also mid epigastric, without radiation and present for most of the waking hours since yesterday. No clear aggravating or releiving factors, although he says he would feel better if he were able to "belch;" there was not a positional component  In ER refractory GI cocktail, NTG in ER--HsTn 12/27 90  >> 111; on admission 04/22/19 81>>113  Discharged 1/1 [facility] having been admitted for AFib with RVR in the setting of a recent dx of lung cancer and more remote prostate Ca.  Had been treated with amiodarone and apixoban in the setting of hypothryoidism (TSH 26); converted spontaneously 12/27 Echo 12/20>> Moderate effusion without tamponade  CT 04/22/19>>"small pericardial effusion "  PET scan scheduled for this friday  Hx of neg myoview in [place] earlier this year and remote neg cath     Thromboembolic risk factors ( age -71 , HTN-1, Vasc disease -1) for a CHADSVASc Score of  3   Heart Pathway Score:     Past Medical History:  Diagnosis Date  . Agent orange exposure   . Asthma    exercise induced asthma   . COPD (chronic obstructive pulmonary disease) ([place])   . DJD (degenerative joint disease)    a. 02/2019 s/p L TKA.  Marland Kitchen GERD (gastroesophageal reflux disease)     . Hearing loss    wears hearing aids  . History of cardiac cath    a. History of 2 cardiac catheterizations, last ~ 10 yrs ago @ [facility]-->reportedly nl.  . History of stress test    a. 2020 - pharmacologic stress testing @ [facility]-->reportedly nl.  . Hyperlipidemia   . Hypertension   . Hypothyroidism   . Insomnia   . Lung cancer ([place])    a. Dx 03/2019.  Marland Kitchen Prostate cancer ([place])   . PTSD (post-traumatic stress disorder)   . Seasonal allergies   . Thyroid disease     Past Surgical History:  Procedure Laterality Date  . BACK SURGERY     x 2  . COLONOSCOPY     hx polyps  . HERNIA REPAIR    . INCISE AND DRAIN ABCESS     patient denies this procedure 03/03/19  . NECK SURGERY     x 1  . TONSILLECTOMY    . 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 TOTAL KNEE ARTHROPLASTY Left 03/09/2019   Procedure: LEFT TOTAL KNEE ARTHROPLASTY;  Surgeon: Meredith Pel, MD;  Location: [facility];  Service: Orthopedics;  Laterality: Left;  . WISDOM TOOTH EXTRACTION         Inpatient Medications: Scheduled Meds: . alum & mag hydroxide-simeth  30 mL Oral Once  . amiodarone  200 mg Oral BID   Followed by  . [START ON 04/30/2019]  amiodarone  200 mg Oral Daily  . aspirin  300 mg Rectal Once  . atorvastatin  20 mg Oral q1800  . calcium-vitamin D  1 tablet Oral BID  . clopidogrel  75 mg Oral Daily  . guaiFENesin  600 mg Oral BID  . levothyroxine  125 mcg Oral QAC breakfast  . lidocaine  15 mL Mouth/Throat Once  . loratadine  10 mg Oral Daily  . pantoprazole  40 mg Oral Daily  . polyethylene glycol  17 g Oral Daily  . polyvinyl alcohol  1 drop Both Eyes BID  . prazosin  10 mg Oral QHS  . sertraline  50 mg Oral QHS  . sucralfate  1 g Oral Once  . tamsulosin  0.4 mg Oral Daily  . traZODone  50 mg Oral QHS   Continuous Infusions: . sodium chloride 100 mL/hr at 04/23/19 0850  . heparin 850 Units/hr (04/23/19 0500)   PRN Meds: acetaminophen, nitroGLYCERIN, ondansetron (ZOFRAN)  IV  Allergies:    Allergies  Allergen Reactions  . Bee Venom Anaphylaxis  . Feldene [Piroxicam] Hives    Social History:   Social History   Socioeconomic History  . Marital status: Married    Spouse name: Not on file  . Number of children: Not on file  . Years of education: Not on file  . Highest education level: Not on file  Occupational History  . Not on file  Tobacco Use  . Smoking status: Former Smoker    Packs/day: 2.00    Years: 20.00    Pack years: 40.00    Types: Cigarettes    Quit date: 04/20/1980    Years since quitting: 39.0  . Smokeless tobacco: Never Used  Substance and Sexual Activity  . Alcohol use: No  . Drug use: No  . Sexual activity: Yes    Birth control/protection: None  Other Topics Concern  . Not on file  Social History Narrative   Lives locally by himself.  Attends pulm rehab @ [facility] but says activity is very limited due to [place].   Social Determinants of Health   Financial Resource Strain:   . Difficulty of Paying Living Expenses: Not on file  Food Insecurity:   . Worried About Charity fundraiser in the Last Year: Not on file  . Ran Out of Food in the Last Year: Not on file  Transportation Needs:   . Lack of Transportation (Medical): Not on file  . Lack of Transportation (Non-Medical): Not on file  Physical Activity:   . Days of Exercise per Week: Not on file  . Minutes of Exercise per Session: Not on file  Stress:   . Feeling of Stress : Not on file  Social Connections:   . Frequency of Communication with Friends and Family: Not on file  . Frequency of Social Gatherings with Friends and Family: Not on file  . Attends Religious Services: Not on file  . Active Member of Clubs or Organizations: Not on file  . Attends Archivist Meetings: Not on file  . Marital Status: Not on file  Intimate Partner Violence:   . Fear of Current or Ex-Partner: Not on file  . Emotionally Abused: Not on file  . Physically Abused: Not on file  .  Sexually Abused: Not on file    Family History:     Family History  Problem Relation Age of Onset  . Breast cancer Mother        w/ brain mets ->  died in her 9's.  Marland Kitchen Heart attack Father        died @ 90  . Other Brother        multiple medical issues, pt unaware of specifics.     ROS:  Please see the history of present illness.    All other ROS reviewed and negative.     Physical Exam/Data:   Vitals:   04/22/19 2201 04/22/19 2220 04/23/19 0403 04/23/19 0729  BP:  (!) 151/88 128/74 129/68  Pulse: 69 73 64 63  Resp: 20 18 18 19   Temp:  98 F (36.7 C) 97.8 F (36.6 C) 98.4 F (36.9 C)  TempSrc:  Oral Oral Oral  SpO2: 100% 96% 97% 95%  Weight:   71.2 kg   Height:        Intake/Output Summary (Last 24 hours) at 04/23/2019 0946 Last data filed at 04/23/2019 0729 Gross per 24 hour  Intake 1738.72 ml  Output 400 ml  Net 1338.72 ml   Last 3 Weights 04/23/2019 04/22/2019 04/16/2019  Weight (lbs) 156 lb 15.5 oz 154 lb 12.2 oz 154 lb 12.2 oz  Weight (kg) 71.2 kg 70.2 kg 70.2 kg     Body mass index is 23.87 kg/m.  General:  Well nourished, well developed, in no acute distress  HEENT: normal Lymph: no adenopathy Neck: no JVD Endocrine:  No thryomegaly Vascular: No carotid bruits; FA pulses 2+ bilaterally without bruits  Cardiac:  normal S1, S2; RRR; no murmur   Lungs:  clear to auscultation bilaterally, no wheezing, rhonchi or rales  Abd: soft, nontender, no hepatomegaly  Intermittent epigastric discomfort  Ext: no edema Musculoskeletal:  No deformities, BUE and BLE strength normal and equal Skin: warm and dry  Neuro:  CNs 2-12 intact, no focal abnormalities noted Psych:  Normal affect   EKG:  The EKG was personally reviewed and demonstrates: sinus @ 61 21/09/36  Change in anterior leads from last night, suspect lead placement error    Telemetry:  Telemetry was personally reviewed and demonstrates:  Sinus   Relevant CV Studies   Laboratory Data:  High Sensitivity  Troponin:   Recent Labs  Lab 04/15/19 2227 04/16/19 0027 04/22/19 1233 04/22/19 1916  TROPONINIHS 90* 111* 81* 113*     Chemistry Recent Labs  Lab 04/19/19 0330 04/20/19 0240 04/22/19 1233  NA 134* 135 138  K 4.0 3.6 4.0  CL 105 105 103  CO2 21* 22 26  GLUCOSE 98 109* 110*  BUN 14 15 16   CREATININE 1.15 1.13 0.96  CALCIUM 9.7 9.5 10.6*  GFRNONAA >60 >60 >60  GFRAA >60 >60 >60  ANIONGAP 8 8 9     Recent Labs  Lab 04/20/19 0240 04/22/19 1233  PROT 5.2* 7.1  ALBUMIN 2.5* 3.4*  AST 19 21  ALT 14 17  ALKPHOS 79 99  BILITOT 0.5 0.5   Hematology Recent Labs  Lab 04/19/19 0330 04/20/19 0240 04/22/19 1233  WBC 5.3 5.1 6.0  RBC 3.52* 3.29* 3.96*  HGB 10.8* 10.1* 12.0*  HCT 32.9* 30.6* 35.4*  MCV 93.5 93.0 89.4  MCH 30.7 30.7 30.3  MCHC 32.8 33.0 33.9  RDW 15.0 15.0 15.3  PLT 259 264 350   BNPNo results for input(s): BNP, PROBNP in the last 168 hours.  DDimer No results for input(s): DDIMER in the last 168 hours.   Radiology/Studies:  CT abd  Result Date: 04/22/2019 CLINICAL DATA:  Emesis since yesterday. New onset of atrial fibrillation. On blood thinners. Sensation of food  stuck in esophagus. Lung and prostate cancer. EXAM: CT ABDOMEN AND PELVIS WITH CONTRAST TECHNIQUE: Multidetector CT imaging of the abdomen and pelvis was performed using the standard protocol following bolus administration of intravenous contrast. CONTRAST:  114mL OMNIPAQUE IOHEXOL 300 MG/ML  SOLN COMPARISON:  Plain film of the abdomen of earlier today. FINDINGS: Lower chest: Mild right base subsegmental atelectasis. Normal heart size with minimal pericardial fluid. Small right pleural effusion. Hepatobiliary: Normal liver. Normal gallbladder, without biliary ductal dilatation. Pancreas: Normal, without mass or ductal dilatation. Spleen: Normal in size, without focal abnormality. Adrenals/Urinary Tract: Normal left adrenal gland. Felt to arise exophytic off the right adrenal is a 2.1 x 1.5 cm  nodule including on 29/2. When compared to the CT of the chest of 04/15/2019, this is low-density on that study, favoring an adenoma. Normal kidneys, without hydronephrosis.  Normal urinary bladder. Stomach/Bowel: Normal stomach, without wall thickening. Colonic stool burden suggests constipation. Normal terminal ileum and appendix. Normal small bowel. Vascular/Lymphatic: Aortic and branch vessel atherosclerosis. No abdominopelvic adenopathy. Reproductive: Normal prostate. Other: No significant free fluid. Small bilateral fat containing inguinal hernias. Musculoskeletal: Lumbar spondylosis. Prior interbody fusion material at L4-5. IMPRESSION: 1. No acute process in the abdomen or pelvis. 2. Possible constipation. 3. Small right pleural effusion. 4. Right adrenal adenoma. 5. Aortic Atherosclerosis (ICD10-I70.0). 6. Small pericardial effusion. Electronically Signed   By: Abigail Miyamoto M.D.   On: 04/22/2019 16:50   DG Abd 2 Views  Result Date: 04/22/2019 CLINICAL DATA:  Abdominal pain EXAM: ABDOMEN - 2 VIEW COMPARISON:  None. FINDINGS: The bowel gas pattern is normal. Moderate volume of stool throughout the colon. There is no evidence of free air. No radio-opaque calculi or other significant radiographic abnormality is seen. IMPRESSION: Nonobstructive bowel gas pattern with moderate volume of stool throughout the colon. Electronically Signed   By: Davina Poke D.O.   On: 04/22/2019 13:30       HEAR Score (for undifferentiated chest pain):        Assessment and Plan:   1. Chest Pain 2. Nausea and Vomiting  3. Atrial fibrillation paroxysmal 4. Lung Ca 5. hypothyroidism  Pt has had protracted discomfort following two episodes of what he describes as violent vomiting.  I doubt this is cardiac, ECG unchanged and hsTn levels essentially unchanged from last week, and this in setting [facility].   will check hs Tn and if not strikingly different would proceed with  discharge, resuming his apixoban and continuing his amiodarone   Has cardiology followup 1/18 w Dr Waldron Session { I   Signed, Virl Axe, MD  04/23/2019 3:06 PM    Signed, Virl Axe, MD  04/23/2019 9:46 AM

## 2019-04-23 NOTE — Discharge Instructions (Signed)
Chest Wall Pain Chest wall pain is pain in or around the bones and muscles of your chest. Chest wall pain may be caused by:  An injury.  Coughing a lot.  Using your chest and arm muscles too much. Sometimes, the cause may not be known. This pain may take a few weeks or longer to get better. Follow these instructions at home: Managing pain, stiffness, and swelling If told, put ice on the painful area:  Put ice in a plastic bag.  Place a towel between your skin and the bag.  Leave the ice on for 20 minutes, 2-3 times a day.  Activity  Rest as told by your doctor.  Avoid doing things that cause pain. This includes lifting heavy items.  Ask your doctor what activities are safe for you. General instructions   Take over-the-counter and prescription medicines only as told by your doctor.  Do not use any products that contain nicotine or tobacco, such as cigarettes, e-cigarettes, and chewing tobacco. If you need help quitting, ask your doctor.  Keep all follow-up visits as told by your doctor. This is important. Contact a doctor if:  You have a fever.  Your chest pain gets worse.  You have new symptoms. Get help right away if:  You feel sick to your stomach (nauseous) or you throw up (vomit).  You feel sweaty or light-headed.  You have a cough with mucus from your lungs (sputum) or you cough up blood.  You are short of breath. These symptoms may be an emergency. Do not wait to see if the symptoms will go away. Get medical help right away. Call your local emergency services (911 in the [place] Do not drive yourself to the hospital. Summary  Chest wall pain is pain in or around the bones and muscles of your chest.  It may be treated with ice, rest, and medicines. Your condition may also get better if you avoid doing things that cause pain.  Contact a doctor if yo  Nausea, Adult Nausea is feeling sick to your stomach or feeling that you are about to throw up (vomit).  Feeling sick to your stomach is usually not serious, but it may be an early sign of a more serious medical problem. As you feel sicker to your stomach, you may throw up. If you throw up, or if you are not able to drink enough fluids, there is a risk that you may lose too much water in your body (get dehydrated). If you lose too much water in your body, you may: Feel tired. Feel thirsty. Have a dry mouth. Have cracked lips. Go pee (urinate) less often. Older adults and people who have other diseases or a weak body defense system (immune system) have a higher risk of losing too much water in the body. The main goals of treating this condition are: To relieve your nausea. To ensure your nausea occurs less often. To prevent throwing up and losing too much fluid. Follow these instructions at home: Watch your symptoms for any changes. Tell your doctor about them. Follow these instructions as told by your doctor. Eating and drinking     Take an ORS (oral rehydration solution). This is a drink that is sold at pharmacies and stores. Drink clear fluids in small amounts as you are able. These include: Water. Ice chips. Fruit juice that has water added (diluted fruit juice). Low-calorie sports drinks. Eat bland, easy-to-digest foods in small amounts as you are able, such as: Bananas. Applesauce.  Rice. Low-fat (lean) meats. Toast. Crackers. Avoid drinking fluids that have a lot of sugar or caffeine in them. This includes energy drinks, sports drinks, and soda. Avoid alcohol. Avoid spicy or fatty foods. General instructions Take over-the-counter and prescription medicines only as told by your doctor. Rest at home while you get better. Drink enough fluid to keep your pee (urine) pale yellow. Take slow and deep breaths when you feel sick to your stomach. Avoid food or things that have strong smells. Wash your hands often with soap and water. If you cannot use soap and water, use hand  sanitizer. Make sure that all people in your home wash their hands well and often. Keep all follow-up visits as told by your doctor. This is important. Contact a doctor if: You feel sicker to your stomach. You feel sick to your stomach for more than 2 days. You throw up. You are not able to drink fluids without throwing up. You have new symptoms. You have a fever. You have a headache. You have muscle cramps. You have a rash. You have pain while peeing. You feel light-headed or dizzy. Get help right away if: You have pain in your chest, neck, arm, or jaw. You feel very weak or you pass out (faint). You have throw up that is bright red or looks like coffee grounds. You have bloody or black poop (stools) or poop that looks like tar. You have a very bad headache, a stiff neck, or both. You have very bad pain, cramping, or bloating in your belly (abdomen). You have trouble breathing or you are breathing very quickly. Your heart is beating very quickly. Your skin feels cold and clammy. You feel confused. You have signs of losing too much water in your body, such as: Dark pee, very little pee, or no pee. Cracked lips. Dry mouth. Sunken eyes. Sleepiness. Weakness. These symptoms may be an emergency. Do not wait to see if the symptoms will go away. Get medical help right away. Call your local emergency services (911 in the [place] Do not drive yourself to the hospital. Summary Nausea is feeling sick to your stomach or feeling that you are about to throw up (vomit). If you throw up, or if you are not able to drink enough fluids, there is a risk that you may lose too much water in your body (get dehydrated). Eat and drink what your doctor tells you. Take over-the-counter and prescription medicines only as told by your doctor. Contact a doctor right away if your symptoms get worse or you have new symptoms. Keep all follow-up visits as told by your doctor. This is important. This  information is not intended to replace advice given to you by your health care provider. Make sure you discuss any questions you have with your health care provider. Document Revised: 09/14/2017 Document Reviewed: 09/14/2017 Elsevier Patient Education  2020 [employer].  u have a fever, chest pain that gets worse, or new symptoms.  Get help right away if you feel light-headed or you get short of breath. These symptoms may be an emergency. This information is not intended to replace advice given to you by your health care provider. Make sure you discuss any questions you have with your health care provider. Document Revised: 10/07/2017 Document Reviewed: 10/07/2017 Elsevier Patient Education  2020 [employer].

## 2019-04-23 NOTE — Progress Notes (Signed)
ANTICOAGULATION CONSULT NOTE - Initial Consult  Pharmacy Consult for Heparin  Indication: chest pain/ACS  Allergies  Allergen Reactions  . Bee Venom Anaphylaxis  . Feldene [Piroxicam] Hives    Patient Measurements: Height: 5\' 8"  (172.7 cm) Weight: 156 lb 15.5 oz (71.2 kg) IBW/kg (Calculated) : 68.4 Heparin Dosing Weight:  70.2 kg   Vital Signs: Temp: 98.4 F (36.9 C) (01/03 0729) Temp Source: Oral (01/03 0729) BP: 129/68 (01/03 0729) Pulse Rate: 63 (01/03 0729)  Labs: Recent Labs    04/22/19 1233 04/22/19 1916 04/22/19 2107 04/23/19 0615 04/23/19 1356  HGB 12.0*  --   --   --   --   HCT 35.4*  --   --   --   --   PLT 350  --   --   --   --   APTT  --   --  29 92* 76*  LABPROT  --   --  15.0  --   --   INR  --   --  1.2  --   --   HEPARINUNFRC  --   --  1.23* 1.38*  --   CREATININE 0.96  --   --   --   --   TROPONINIHS 81* 113*  --   --   --     Estimated Creatinine Clearance: 61.4 mL/min (by C-G formula based on SCr of 0.96 mg/dL).   Medical History: Past Medical History:  Diagnosis Date  . Agent orange exposure   . Asthma    exercise induced asthma   . COPD (chronic obstructive pulmonary disease) ([place])   . DJD (degenerative joint disease)    a. 02/2019 s/p L TKA.  Marland Kitchen GERD (gastroesophageal reflux disease)   . Hearing loss    wears hearing aids  . History of cardiac cath    a. History of 2 cardiac catheterizations, last ~ 10 yrs ago @ [facility]-->reportedly nl.  . History of stress test    a. 2020 - pharmacologic stress testing @ [facility]-->reportedly nl.  . Hyperlipidemia   . Hypertension   . Hypothyroidism   . Insomnia   . Lung cancer ([place])    a. Dx 03/2019.  Marland Kitchen Prostate cancer ([place])   . PTSD (post-traumatic stress disorder)   . Seasonal allergies   . Thyroid disease     Medications:  Facility-Administered Medications Prior to Admission  Medication Dose Route Frequency Provider Last Rate Last Admin  . Leuprolide Acetate (6 Month) (LUPRON) injection  45 mg  45 mg Intramuscular Once Stoioff, Scott C, MD       Medications Prior to Admission  Medication Sig Dispense Refill Last Dose  . amiodarone (PACERONE) 100 MG tablet Take 2 tablets (200 mg total) by mouth 2 (two) times daily for 7 days, THEN 2 tablets (200 mg total) daily. 88 tablet 0 04/21/2019 at Unknown time  . apixaban (ELIQUIS) 5 MG TABS tablet Take 1 tablet (5 mg total) by mouth 2 (two) times daily. 60 tablet 0 04/21/2019 at Unknown time  . Calcium Carbonate-Vitamin D 600-400 MG-UNIT tablet Take 1 tablet by mouth 2 (two) times daily.    04/21/2019 at Unknown time  . cetirizine (ZYRTEC) 10 MG tablet Take 10 mg by mouth daily.    04/21/2019 at Unknown time  . guaifenesin (HUMIBID E) 400 MG TABS tablet Take 400 mg by mouth 3 (three) times daily.   04/21/2019 at Unknown time  . levothyroxine (SYNTHROID) 125 MCG tablet Take 125 mcg by mouth  daily before breakfast.   04/21/2019 at Unknown time  . omeprazole (PRILOSEC) 20 MG capsule Take 20 mg by mouth 2 (two) times daily as needed (acid reflux/indigestion.).    04/21/2019 at Unknown time  . polyethylene glycol (MIRALAX / GLYCOLAX) 17 g packet Take 17 g by mouth daily. 14 each 0 04/21/2019 at Unknown time  . pravastatin (PRAVACHOL) 20 MG tablet Take 20 mg by mouth at bedtime.   04/21/2019 at Unknown time  . prazosin (MINIPRESS) 5 MG capsule Take 10 mg by mouth at bedtime.    04/21/2019 at Unknown time  . REFRESH TEARS 0.5 % SOLN Place 1 drop into both eyes 2 (two) times daily.    04/21/2019 at Unknown time  . sertraline (ZOLOFT) 50 MG tablet Take 50 mg by mouth at bedtime.    04/21/2019 at Unknown time  . tamsulosin (FLOMAX) 0.4 MG CAPS capsule Take 0.4 mg by mouth daily.    04/21/2019 at Unknown time  . traZODone (DESYREL) 50 MG tablet Take 50 mg by mouth at bedtime.   04/21/2019 at Unknown time    Assessment: Pharmacy consulted to dose heparin in this 79 year old male admitted with ACS/NSTEMI. Pt is also on plavix.   Pt was on Eliquis 5 mg PO BID at home, last dose  was on 12/31 PM.    1/3 0615 aPTT 92 HL 1.38  1/3 1356 aPTT 76   Goal of Therapy:  HL = 0.3 - 0.7 APTT = 66 - 102  Plan:  APTT is therapeutic. Heparin level seems to be falsely elevated. Will continue heparin infusion at current rate (850 units/hr). Will order aPTT/Heparin level/CBC with AM labs.  Will use aPTT to guide heparin dosing until HL and aPTT converge.   Oswald Hillock, PharmD, BCPS  04/23/2019,2:44 PM

## 2019-04-24 ENCOUNTER — Telehealth: Payer: Self-pay

## 2019-04-24 ENCOUNTER — Encounter: Payer: Self-pay | Admitting: Oncology

## 2019-04-24 ENCOUNTER — Inpatient Hospital Stay: Payer: No Typology Code available for payment source | Attending: Oncology | Admitting: Oncology

## 2019-04-24 ENCOUNTER — Other Ambulatory Visit: Payer: Self-pay

## 2019-04-24 VITALS — BP 116/90 | HR 92 | Temp 97.9°F | Resp 22 | Wt 157.3 lb

## 2019-04-24 DIAGNOSIS — C3491 Malignant neoplasm of unspecified part of right bronchus or lung: Secondary | ICD-10-CM

## 2019-04-24 DIAGNOSIS — C61 Malignant neoplasm of prostate: Secondary | ICD-10-CM

## 2019-04-24 DIAGNOSIS — I4891 Unspecified atrial fibrillation: Secondary | ICD-10-CM | POA: Diagnosis not present

## 2019-04-24 DIAGNOSIS — Z803 Family history of malignant neoplasm of breast: Secondary | ICD-10-CM

## 2019-04-24 DIAGNOSIS — Z87891 Personal history of nicotine dependence: Secondary | ICD-10-CM

## 2019-04-24 DIAGNOSIS — Z923 Personal history of irradiation: Secondary | ICD-10-CM

## 2019-04-24 DIAGNOSIS — Z79899 Other long term (current) drug therapy: Secondary | ICD-10-CM

## 2019-04-24 DIAGNOSIS — M899 Disorder of bone, unspecified: Secondary | ICD-10-CM

## 2019-04-24 DIAGNOSIS — Z7901 Long term (current) use of anticoagulants: Secondary | ICD-10-CM

## 2019-04-24 DIAGNOSIS — I313 Pericardial effusion (noninflammatory): Secondary | ICD-10-CM | POA: Diagnosis not present

## 2019-04-24 NOTE — Telephone Encounter (Signed)
Carlos Barrera will need to see his cardiologist before he can return to exercise.  He is having a scan Friday to develop a plan for cancer treatment

## 2019-04-24 NOTE — Progress Notes (Signed)
Pt here to establish care for lung cancer, referred from [place]. Pt was just discharged yesterday from hospital.

## 2019-04-25 ENCOUNTER — Telehealth: Payer: Self-pay

## 2019-04-25 DIAGNOSIS — C61 Malignant neoplasm of prostate: Secondary | ICD-10-CM | POA: Insufficient documentation

## 2019-04-25 DIAGNOSIS — Z7189 Other specified counseling: Secondary | ICD-10-CM | POA: Insufficient documentation

## 2019-04-25 NOTE — Progress Notes (Signed)
[place]  [phone] [phone]  ID: Carlos Barrera OB: 1940/05/19  MR#: [id number]  GHW#:[id number]  Patient Care Team: [facility] as PCP - General Kate Sable, MD as PCP - Cardiology (Cardiology)  CHIEF COMPLAINT: Adenocarcinoma of the right lung  INTERVAL HISTORY: Patient is a 79 year old male with multiple medical problems who recently underwent EBUS confirming the above-stated malignancy.  He was discharged from the hospital yesterday after being admitted for atrial fibrillation.  He currently feels well and nearly back to his baseline.  He has no neurologic complaints.  He denies any recent fevers.  He has a fair appetite with some mild weight loss.  He denies any chest pain, shortness of breath, cough, or hemoptysis.  He has no nausea, vomiting, constipation, or diarrhea.  He has no urinary complaints.  Patient offers no further specific complaints today.  REVIEW OF SYSTEMS:   Review of Systems  Constitutional: Positive for weight loss. Negative for fever and malaise/fatigue.  Respiratory: Negative.  Negative for cough and shortness of breath.   Cardiovascular: Negative.  Negative for chest pain and leg swelling.  Gastrointestinal: Negative.  Negative for abdominal pain.  Genitourinary: Negative.  Negative for dysuria.  Musculoskeletal: Negative.  Negative for back pain.  Skin: Negative.  Negative for rash.  Neurological: Negative.  Negative for dizziness, weakness and headaches.  Psychiatric/Behavioral: Negative.  The patient is not nervous/anxious.     As per HPI. Otherwise, a complete review of systems is negative.  PAST MEDICAL HISTORY: Past Medical History:  Diagnosis Date  . Agent orange exposure   . Asthma    exercise induced asthma   . COPD (chronic obstructive pulmonary disease) ([place])   . DJD (degenerative joint disease)    a. 02/2019 s/p L TKA.  Marland Kitchen GERD (gastroesophageal reflux disease)   . Hearing  loss    wears hearing aids  . History of cardiac cath    a. History of 2 cardiac catheterizations, last ~ 10 yrs ago @ [facility]-->reportedly nl.  . History of stress test    a. 2020 - pharmacologic stress testing @ [facility]-->reportedly nl.  . Hyperlipidemia   . Hypertension   . Hypothyroidism   . Insomnia   . Lung cancer ([place])    a. Dx 03/2019.  Marland Kitchen Prostate cancer ([place])   . PTSD (post-traumatic stress disorder)   . Seasonal allergies   . Thyroid disease     PAST SURGICAL HISTORY: Past Surgical History:  Procedure Laterality Date  . BACK SURGERY     x 2  . COLONOSCOPY     hx polyps  . HERNIA REPAIR    . INCISE AND DRAIN ABCESS     patient denies this procedure 03/03/19  . NECK SURGERY     x 1  . TONSILLECTOMY    . 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 TOTAL KNEE ARTHROPLASTY Left 03/09/2019   Procedure: LEFT TOTAL KNEE ARTHROPLASTY;  Surgeon: Meredith Pel, MD;  Location: [facility];  Service: Orthopedics;  Laterality: Left;  . WISDOM TOOTH EXTRACTION      FAMILY HISTORY: Family History  Problem Relation Age of Onset  . Breast cancer Mother        w/ brain mets -> died in her 7's.  Marland Kitchen Heart attack Father        died @ 36  . Other Brother        multiple medical issues, pt unaware of specifics.    ADVANCED DIRECTIVES (Y/N):  N  HEALTH MAINTENANCE: Social History   Tobacco Use  . Smoking status: Former Smoker    Packs/day: 2.00    Years: 20.00    Pack years: 40.00    Types: Cigarettes    Quit date: 04/20/1980    Years since quitting: 39.0  . Smokeless tobacco: Never Used  Substance Use Topics  . Alcohol use: No  . Drug use: No     Colonoscopy:  PAP:  Bone density:  Lipid panel:  Allergies  Allergen Reactions  . Bee Venom Anaphylaxis  . Feldene [Piroxicam] Hives    Current Outpatient Medications  Medication Sig Dispense Refill  . alum & mag hydroxide-simeth (MAALOX/MYLANTA) 200-200-20 MG/5ML suspension Take 30 mLs by mouth every 6 (six) hours as  needed for indigestion or heartburn. 355 mL 0  . amiodarone (PACERONE) 100 MG tablet Take 2 tablets (200 mg total) by mouth 2 (two) times daily for 7 days, THEN 2 tablets (200 mg total) daily. 88 tablet 0  . apixaban (ELIQUIS) 5 MG TABS tablet Take 1 tablet (5 mg total) by mouth 2 (two) times daily. 60 tablet 0  . Calcium Carbonate-Vitamin D 600-400 MG-UNIT tablet Take 1 tablet by mouth 2 (two) times daily.     . cetirizine (ZYRTEC) 10 MG tablet Take 10 mg by mouth daily.     Marland Kitchen guaifenesin (HUMIBID E) 400 MG TABS tablet Take 400 mg by mouth 3 (three) times daily.    Marland Kitchen levothyroxine (SYNTHROID) 125 MCG tablet Take 125 mcg by mouth daily before breakfast.    . omeprazole (PRILOSEC) 20 MG capsule Take 20 mg by mouth 2 (two) times daily as needed (acid reflux/indigestion.).     Marland Kitchen ondansetron (ZOFRAN ODT) 4 MG disintegrating tablet Take 1 tablet (4 mg total) by mouth every 8 (eight) hours as needed for nausea or vomiting. 20 tablet 0  . polyethylene glycol (MIRALAX / GLYCOLAX) 17 g packet Take 17 g by mouth daily. 14 each 0  . pravastatin (PRAVACHOL) 20 MG tablet Take 20 mg by mouth at bedtime.    . prazosin (MINIPRESS) 5 MG capsule Take 10 mg by mouth at bedtime.     Marland Kitchen REFRESH TEARS 0.5 % SOLN Place 1 drop into both eyes 2 (two) times daily.     . sertraline (ZOLOFT) 50 MG tablet Take 50 mg by mouth at bedtime.     . tamsulosin (FLOMAX) 0.4 MG CAPS capsule Take 0.4 mg by mouth daily.     . traZODone (DESYREL) 50 MG tablet Take 50 mg by mouth at bedtime.     Current Facility-Administered Medications  Medication Dose Route Frequency Provider Last Rate Last Admin  . Leuprolide Acetate (6 Month) (LUPRON) injection 45 mg  45 mg Intramuscular Once Stoioff, Scott C, MD        OBJECTIVE: Vitals:   04/24/19 1259  BP: 116/90  Pulse: 92  Resp: (!) 22  Temp: 97.9 F (36.6 C)  SpO2: 98%     Body mass index is 23.92 kg/m.    ECOG FS:1 - Symptomatic but completely ambulatory  General: Well-developed,  well-nourished, no acute distress. Eyes: Pink conjunctiva, anicteric sclera. HEENT: Normocephalic, moist mucous membranes. Lungs: No audible wheezing or coughing. Heart: Regular rate and rhythm. Abdomen: Soft, nontender, no obvious distention. Musculoskeletal: No edema, cyanosis, or clubbing. Neuro: Alert, answering all questions appropriately. Cranial nerves grossly intact. Skin: No rashes or petechiae noted. Psych: Normal affect. Lymphatics: No cervical, calvicular, axillary or inguinal LAD.   LAB RESULTS:  Lab Results  Component Value Date   NA 138 04/22/2019   K 4.0 04/22/2019   CL 103 04/22/2019   CO2 26 04/22/2019   GLUCOSE 110 (H) 04/22/2019   BUN 16 04/22/2019   CREATININE 0.96 04/22/2019   CALCIUM 10.6 (H) 04/22/2019   PROT 7.1 04/22/2019   ALBUMIN 3.4 (L) 04/22/2019   AST 21 04/22/2019   ALT 17 04/22/2019   ALKPHOS 99 04/22/2019   BILITOT 0.5 04/22/2019   GFRNONAA >60 04/22/2019   GFRAA >60 04/22/2019    Lab Results  Component Value Date   WBC 6.0 04/22/2019   NEUTROABS 5.7 04/15/2019   HGB 12.0 (L) 04/22/2019   HCT 35.4 (L) 04/22/2019   MCV 89.4 04/22/2019   PLT 350 04/22/2019     STUDIES: CT Angio Chest PE W and/or Wo Contrast  Result Date: 04/16/2019 CLINICAL DATA:  Shortness of breath EXAM: CT ANGIOGRAPHY CHEST WITH CONTRAST TECHNIQUE: Multidetector CT imaging of the chest was performed using the standard protocol during bolus administration of intravenous contrast. Multiplanar CT image reconstructions and MIPs were obtained to evaluate the vascular anatomy. CONTRAST:  184mL OMNIPAQUE IOHEXOL 350 MG/ML SOLN COMPARISON:  None. FINDINGS: Cardiovascular: There is a optimal opacification of the pulmonary arteries. There is no central,segmental, or subsegmental filling defects within the pulmonary arteries. There is mild cardiomegaly. A small to moderate pericardial effusion is present. Mild scattered aortic atherosclerosis is seen. There is normal  three-vessel brachiocephalic anatomy. Mediastinum/Nodes: Subcarinal adenopathy is seen measuring 2.2 cm in AP dimension. There is also a right hilar adenopathy which measures 1.5 cm in AP dimension. Thyroid gland, trachea, and esophagus demonstrate no significant findings. Lungs/Pleura: There is small peripheral patchy airspace opacity seen within the posterior right upper and lower lung. There is also tree-in-bud opacity seen in the periphery of the right lung base. There is a small right and trace left pleural effusion. Mildly increased interstitial thickening seen within both upper lungs. Upper Abdomen: No acute abnormalities present in the visualized portions of the upper abdomen. Musculoskeletal: No chest wall abnormality. No acute or significant osseous findings. Review of the MIP images confirms the above findings. IMPRESSION: No central, segmental, or subsegmental pulmonary embolism. Small to moderate pericardial effusion Subcarinal and right hilar adenopathy which could be reactive. Patchy/ground-glass opacity in the posterior right lung which could be due to atelectasis and/or infectious etiology. Small right and trace left pleural effusion. Mildly increased interstitial thickening seen within both upper lungs likely due to mild interstitial edema. Aortic Atherosclerosis (ICD10-I70.0). Electronically Signed   By: Prudencio Pair M.D.   On: 04/16/2019 00:04   CT abd  Result Date: 04/22/2019 CLINICAL DATA:  Emesis since yesterday. New onset of atrial fibrillation. On blood thinners. Sensation of food stuck in esophagus. Lung and prostate cancer. EXAM: CT ABDOMEN AND PELVIS WITH CONTRAST TECHNIQUE: Multidetector CT imaging of the abdomen and pelvis was performed using the standard protocol following bolus administration of intravenous contrast. CONTRAST:  173mL OMNIPAQUE IOHEXOL 300 MG/ML  SOLN COMPARISON:  Plain film of the abdomen of earlier today. FINDINGS: Lower chest: Mild right base subsegmental  atelectasis. Normal heart size with minimal pericardial fluid. Small right pleural effusion. Hepatobiliary: Normal liver. Normal gallbladder, without biliary ductal dilatation. Pancreas: Normal, without mass or ductal dilatation. Spleen: Normal in size, without focal abnormality. Adrenals/Urinary Tract: Normal left adrenal gland. Felt to arise exophytic off the right adrenal is a 2.1 x 1.5 cm nodule including on 29/2. When compared to the CT of the chest of  04/15/2019, this is low-density on that study, favoring an adenoma. Normal kidneys, without hydronephrosis.  Normal urinary bladder. Stomach/Bowel: Normal stomach, without wall thickening. Colonic stool burden suggests constipation. Normal terminal ileum and appendix. Normal small bowel. Vascular/Lymphatic: Aortic and branch vessel atherosclerosis. No abdominopelvic adenopathy. Reproductive: Normal prostate. Other: No significant free fluid. Small bilateral fat containing inguinal hernias. Musculoskeletal: Lumbar spondylosis. Prior interbody fusion material at L4-5. IMPRESSION: 1. No acute process in the abdomen or pelvis. 2. Possible constipation. 3. Small right pleural effusion. 4. Right adrenal adenoma. 5. Aortic Atherosclerosis (ICD10-I70.0). 6. Small pericardial effusion. Electronically Signed   By: Abigail Miyamoto M.D.   On: 04/22/2019 16:50   DG CHEST PORT 1 VIEW  Result Date: 04/17/2019 CLINICAL DATA:  Atrial fibrillation and shortness of breath. Evaluate for pulmonary infiltrates. EXAM: PORTABLE CHEST 1 VIEW COMPARISON:  04/16/2019 FINDINGS: Heart size appears within normal limits. New small right pleural effusion identified. Mild asymmetric edema identified within the right mid and right lower lung. Left lung is clear. IMPRESSION: 1. Small right effusion with mild asymmetric edema in the right mid and right lower lung. Electronically Signed   By: Kerby Moors M.D.   On: 04/17/2019 09:27   DG Chest Portable 1 View  Result Date:  04/16/2019 CLINICAL DATA:  Shortness of breath EXAM: PORTABLE CHEST 1 VIEW COMPARISON:  None. FINDINGS: The heart size and mediastinal contours are within normal limits. There is mildly increased interstitial markings seen throughout both lungs. There is patchy airspace opacity at the periphery of the right lung base. The visualized skeletal structures are unremarkable. IMPRESSION: Mildly increased interstitial markings throughout both lungs with patchy airspace opacity in the right lung base. This could be due to asymmetric edema and/or infectious etiology. Electronically Signed   By: Prudencio Pair M.D.   On: 04/16/2019 00:39   DG Abd 2 Views  Result Date: 04/22/2019 CLINICAL DATA:  Abdominal pain EXAM: ABDOMEN - 2 VIEW COMPARISON:  None. FINDINGS: The bowel gas pattern is normal. Moderate volume of stool throughout the colon. There is no evidence of free air. No radio-opaque calculi or other significant radiographic abnormality is seen. IMPRESSION: Nonobstructive bowel gas pattern with moderate volume of stool throughout the colon. Electronically Signed   By: Davina Poke D.O.   On: 04/22/2019 13:30   ECHOCARDIOGRAM COMPLETE  Result Date: 04/16/2019   ECHOCARDIOGRAM REPORT   Patient Name:   NAFTALI CARCHI Date of Exam: 04/16/2019 Medical Rec #:  [id number]       Height:       68.0 in Accession #:    [id number]      Weight:       154.8 lb Date of Birth:  1940-08-18       BSA:          1.83 m Patient Age:    67 years        BP:           105/54 mmHg Patient Gender: M               HR:           74 bpm. Exam Location:  Inpatient Procedure: 2D Echo, Color Doppler and Cardiac Doppler Indications:    Atrial fibrillation  History:        Patient has no prior history of Echocardiogram examinations.                 Lung and prostate cancer.  Sonographer:    Merrie Roof  RDCS Referring Phys: [place]  1. Left ventricular ejection fraction, by visual estimation, is 60 to 65%. The  left ventricle has normal function. There is no left ventricular hypertrophy.  2. The left ventricle has no regional wall motion abnormalities.  3. Global right ventricle has normal systolic function.The right ventricular size is normal. No increase in right ventricular wall thickness.  4. Left atrial size was mildly dilated.  5. Right atrial size was normal.  6. Moderate pericardial effusion.  7. The pericardial effusion is anterior to the right ventricle.  8. Mild mitral annular calcification.  9. The mitral valve is normal in structure. No evidence of mitral valve regurgitation. No evidence of mitral stenosis. 10. The tricuspid valve is normal in structure. 11. The aortic valve is normal in structure. Aortic valve regurgitation is not visualized. Mild aortic valve sclerosis without stenosis. 12. The pulmonic valve was normal in structure. Pulmonic valve regurgitation is not visualized. 13. The inferior vena cava is normal in size with greater than 50% respiratory variability, suggesting right atrial pressure of 3 mmHg. 14. No prior Echocardiogram. FINDINGS  Left Ventricle: Left ventricular ejection fraction, by visual estimation, is 60 to 65%. The left ventricle has normal function. The left ventricle has no regional wall motion abnormalities. There is no left ventricular hypertrophy. Left ventricular diastolic parameters were normal. Normal left atrial pressure. Right Ventricle: The right ventricular size is normal. No increase in right ventricular wall thickness. Global RV systolic function is has normal systolic function. Left Atrium: Left atrial size was mildly dilated. Right Atrium: Right atrial size was normal in size Pericardium: A moderately sized pericardial effusion is present. The pericardial effusion is anterior to the right ventricle. There is no evidence of cardiac tamponade. Mitral Valve: The mitral valve is normal in structure. Mild mitral annular calcification. No evidence of mitral valve  regurgitation. No evidence of mitral valve stenosis by observation. Tricuspid Valve: The tricuspid valve is normal in structure. Tricuspid valve regurgitation is not demonstrated. Aortic Valve: The aortic valve is normal in structure. Aortic valve regurgitation is not visualized. Mild aortic valve sclerosis is present, with no evidence of aortic valve stenosis. Pulmonic Valve: The pulmonic valve was normal in structure. Pulmonic valve regurgitation is not visualized. Pulmonic regurgitation is not visualized. Aorta: The aortic root, ascending aorta and aortic arch are all structurally normal, with no evidence of dilitation or obstruction. Venous: The inferior vena cava was not well visualized. The inferior vena cava is normal in size with greater than 50% respiratory variability, suggesting right atrial pressure of 3 mmHg. IAS/Shunts: No atrial level shunt detected by color flow Doppler. There is no evidence of a patent foramen ovale. No ventricular septal defect is seen or detected. There is no evidence of an atrial septal defect.  LEFT VENTRICLE PLAX 2D LVIDd:         4.35 cm       Diastology LVIDs:         2.82 cm       LV e' lateral:   7.83 cm/s LV PW:         1.13 cm       LV E/e' lateral: 8.7 LV IVS:        1.04 cm       LV e' medial:    8.38 cm/s LVOT diam:     1.90 cm       LV E/e' medial:  8.1 LV SV:  55 ml LV SV Index:   30.05 LVOT Area:     2.84 cm  LV Volumes (MOD) LV area d, A4C:    24.20 cm LV area s, A4C:    12.20 cm LV major d, A4C:   7.25 cm LV major s, A4C:   5.94 cm LV vol d, MOD A4C: 66.1 ml LV vol s, MOD A4C: 20.9 ml LV SV MOD A4C:     66.1 ml RIGHT VENTRICLE RV Basal diam:  3.54 cm LEFT ATRIUM             Index       RIGHT ATRIUM           Index LA diam:        4.20 cm 2.29 cm/m  RA Area:     12.80 cm LA Vol (A2C):   42.1 ml 22.97 ml/m RA Volume:   27.60 ml  15.06 ml/m LA Vol (A4C):   51.6 ml 28.15 ml/m LA Biplane Vol: 50.0 ml 27.28 ml/m  AORTIC VALVE LVOT Vmax:   71.10 cm/s LVOT  Vmean:  47.800 cm/s LVOT VTI:    0.138 m  AORTA Ao Root diam: 3.60 cm Ao Asc diam:  3.60 cm MITRAL VALVE MV Area (PHT): 4.29 cm             SHUNTS MV PHT:        51.33 msec           Systemic VTI:  0.14 m MV Decel Time: 177 msec             Systemic Diam: 1.90 cm MV E velocity: 68.10 cm/s 103 cm/s MV A velocity: 56.10 cm/s 70.3 cm/s MV E/A ratio:  1.21       1.5  Mihai Croitoru MD Electronically signed by Sanda Klein MD Signature Date/Time: 04/16/2019/5:15:08 PM    Final     ASSESSMENT: Adenocarcinoma of the right lung  PLAN:    1. Adenocarcinoma of the right lung: Patient had biopsy on April 05, 2019 confirming a poorly differentiated adenocarcinoma.  We are attempting to get this report as well as the cell block.  Imaging from the [facility] [place] is at least a stage III, but patient noted to have a suspicious lesion on his occipital bone from his brain MRI possibly indicating stage IV disease.  No intracranial metastasis were noted.  Patient has a PET scan scheduled for Friday January 2021.  He is agreed to video assisted telemedicine visit in 1 week to discuss the results of his PET scan, his staging and diagnosis, as well as treatment planning. 2.  Atrial fibrillation/cardiac disease: Continue follow-up with cardiology as indicated.  Continue Eliquis. 3.  Prostate cancer: Gleason's 4+5.  Appears to be localized disease.  Patient completed XRT at [facility].  He will continue to receive Lupron from the [place].  I spent a total of 60 minutes face-to-face with the patient and reviewing chart data of which greater than 50% of the visit was spent in counseling and coordination of care as detailed above.   Patient expressed understanding and was in agreement with this plan. He also understands that He can call clinic at any time with any questions, concerns, or complaints.   Cancer Staging No matching staging information was found for the patient.  Lloyd Huger, MD   04/25/2019 6:31 AM

## 2019-04-25 NOTE — Telephone Encounter (Signed)
Shanon Brow PT with [facility] called to let you know patient will be discharged from [facility]  He has refused services at this time.

## 2019-04-26 ENCOUNTER — Ambulatory Visit (INDEPENDENT_AMBULATORY_CARE_PROVIDER_SITE_OTHER): Payer: Medicare Other | Admitting: Orthopedic Surgery

## 2019-04-26 ENCOUNTER — Other Ambulatory Visit: Payer: Self-pay

## 2019-04-26 ENCOUNTER — Encounter: Payer: Self-pay | Admitting: Orthopedic Surgery

## 2019-04-26 VITALS — Ht 68.0 in | Wt 162.0 lb

## 2019-04-26 DIAGNOSIS — Z96652 Presence of left artificial knee joint: Secondary | ICD-10-CM

## 2019-04-27 ENCOUNTER — Ambulatory Visit (INDEPENDENT_AMBULATORY_CARE_PROVIDER_SITE_OTHER): Payer: Medicare Other | Admitting: Family

## 2019-04-27 ENCOUNTER — Encounter: Payer: Self-pay | Admitting: Intensive Care

## 2019-04-27 ENCOUNTER — Inpatient Hospital Stay
Admission: EM | Admit: 2019-04-27 | Discharge: 2019-04-29 | DRG: 315 | Disposition: A | Discharge status: Home / Self Care | Payer: No Typology Code available for payment source | Attending: Internal Medicine | Admitting: Internal Medicine

## 2019-04-27 ENCOUNTER — Other Ambulatory Visit: Payer: Self-pay

## 2019-04-27 ENCOUNTER — Encounter: Payer: Self-pay | Admitting: Family

## 2019-04-27 ENCOUNTER — Encounter: Payer: Self-pay | Admitting: Orthopedic Surgery

## 2019-04-27 ENCOUNTER — Emergency Department: Payer: No Typology Code available for payment source

## 2019-04-27 VITALS — BP 120/70 | HR 82 | Ht 68.0 in | Wt 158.5 lb

## 2019-04-27 DIAGNOSIS — J302 Other seasonal allergic rhinitis: Secondary | ICD-10-CM | POA: Diagnosis present

## 2019-04-27 DIAGNOSIS — I480 Paroxysmal atrial fibrillation: Secondary | ICD-10-CM

## 2019-04-27 DIAGNOSIS — I4892 Unspecified atrial flutter: Secondary | ICD-10-CM | POA: Diagnosis not present

## 2019-04-27 DIAGNOSIS — R0600 Dyspnea, unspecified: Secondary | ICD-10-CM

## 2019-04-27 DIAGNOSIS — H9190 Unspecified hearing loss, unspecified ear: Secondary | ICD-10-CM | POA: Diagnosis present

## 2019-04-27 DIAGNOSIS — F4310 Post-traumatic stress disorder, unspecified: Secondary | ICD-10-CM | POA: Diagnosis present

## 2019-04-27 DIAGNOSIS — Z79899 Other long term (current) drug therapy: Secondary | ICD-10-CM

## 2019-04-27 DIAGNOSIS — Z974 Presence of external hearing-aid: Secondary | ICD-10-CM | POA: Diagnosis not present

## 2019-04-27 DIAGNOSIS — C3491 Malignant neoplasm of unspecified part of right bronchus or lung: Secondary | ICD-10-CM | POA: Diagnosis present

## 2019-04-27 DIAGNOSIS — C3490 Malignant neoplasm of unspecified part of unspecified bronchus or lung: Secondary | ICD-10-CM

## 2019-04-27 DIAGNOSIS — E785 Hyperlipidemia, unspecified: Secondary | ICD-10-CM

## 2019-04-27 DIAGNOSIS — Z575 Occupational exposure to toxic agents in other industries: Secondary | ICD-10-CM

## 2019-04-27 DIAGNOSIS — I313 Pericardial effusion (noninflammatory): Secondary | ICD-10-CM | POA: Diagnosis present

## 2019-04-27 DIAGNOSIS — Z96652 Presence of left artificial knee joint: Secondary | ICD-10-CM | POA: Diagnosis present

## 2019-04-27 DIAGNOSIS — Z79890 Hormone replacement therapy (postmenopausal): Secondary | ICD-10-CM

## 2019-04-27 DIAGNOSIS — Z87891 Personal history of nicotine dependence: Secondary | ICD-10-CM

## 2019-04-27 DIAGNOSIS — J449 Chronic obstructive pulmonary disease, unspecified: Secondary | ICD-10-CM | POA: Diagnosis present

## 2019-04-27 DIAGNOSIS — Z7901 Long term (current) use of anticoagulants: Secondary | ICD-10-CM | POA: Diagnosis not present

## 2019-04-27 DIAGNOSIS — J9811 Atelectasis: Secondary | ICD-10-CM | POA: Diagnosis present

## 2019-04-27 DIAGNOSIS — I959 Hypotension, unspecified: Secondary | ICD-10-CM

## 2019-04-27 DIAGNOSIS — R778 Other specified abnormalities of plasma proteins: Secondary | ICD-10-CM | POA: Diagnosis present

## 2019-04-27 DIAGNOSIS — R1310 Dysphagia, unspecified: Secondary | ICD-10-CM | POA: Diagnosis present

## 2019-04-27 DIAGNOSIS — Z79891 Long term (current) use of opiate analgesic: Secondary | ICD-10-CM

## 2019-04-27 DIAGNOSIS — K219 Gastro-esophageal reflux disease without esophagitis: Secondary | ICD-10-CM | POA: Diagnosis present

## 2019-04-27 DIAGNOSIS — Z20822 Contact with and (suspected) exposure to covid-19: Secondary | ICD-10-CM | POA: Diagnosis present

## 2019-04-27 DIAGNOSIS — Z91030 Bee allergy status: Secondary | ICD-10-CM

## 2019-04-27 DIAGNOSIS — Z8546 Personal history of malignant neoplasm of prostate: Secondary | ICD-10-CM

## 2019-04-27 DIAGNOSIS — I10 Essential (primary) hypertension: Secondary | ICD-10-CM | POA: Diagnosis present

## 2019-04-27 DIAGNOSIS — I3139 Other pericardial effusion (noninflammatory): Secondary | ICD-10-CM

## 2019-04-27 DIAGNOSIS — E039 Hypothyroidism, unspecified: Secondary | ICD-10-CM

## 2019-04-27 DIAGNOSIS — G4700 Insomnia, unspecified: Secondary | ICD-10-CM | POA: Diagnosis present

## 2019-04-27 DIAGNOSIS — Z888 Allergy status to other drugs, medicaments and biological substances status: Secondary | ICD-10-CM

## 2019-04-27 DIAGNOSIS — Z8249 Family history of ischemic heart disease and other diseases of the circulatory system: Secondary | ICD-10-CM

## 2019-04-27 DIAGNOSIS — Z803 Family history of malignant neoplasm of breast: Secondary | ICD-10-CM

## 2019-04-27 HISTORY — DX: Pericardial effusion (noninflammatory): I31.3

## 2019-04-27 HISTORY — DX: Other pericardial effusion (noninflammatory): I31.39

## 2019-04-27 LAB — CBC
HCT: 36.5 % — ABNORMAL LOW (ref 39.0–52.0)
Hemoglobin: 11.6 g/dL — ABNORMAL LOW (ref 13.0–17.0)
MCH: 30.3 pg (ref 26.0–34.0)
MCHC: 31.8 g/dL (ref 30.0–36.0)
MCV: 95.3 fL (ref 80.0–100.0)
Platelets: 385 10*3/uL (ref 150–400)
RBC: 3.83 MIL/uL — ABNORMAL LOW (ref 4.22–5.81)
RDW: 16.4 % — ABNORMAL HIGH (ref 11.5–15.5)
WBC: 7 10*3/uL (ref 4.0–10.5)
nRBC: 0 % (ref 0.0–0.2)

## 2019-04-27 LAB — BASIC METABOLIC PANEL
Anion gap: 6 (ref 5–15)
BUN: 12 mg/dL (ref 8–23)
CO2: 30 mmol/L (ref 22–32)
Calcium: 10.3 mg/dL (ref 8.9–10.3)
Chloride: 104 mmol/L (ref 98–111)
Creatinine, Ser: 0.96 mg/dL (ref 0.61–1.24)
GFR calc Af Amer: >60 mL/min (ref >60)
GFR calc non Af Amer: >60 mL/min (ref >60)
Glucose, Bld: 111 mg/dL — ABNORMAL HIGH (ref 70–99)
Potassium: 4.3 mmol/L (ref 3.5–5.1)
Sodium: 140 mmol/L (ref 135–145)

## 2019-04-27 LAB — TROPONIN I (HIGH SENSITIVITY)
Troponin I (High Sensitivity): 121 ng/L (ref <18)
Troponin I (High Sensitivity): 139 ng/L (ref <18)

## 2019-04-27 LAB — RESPIRATORY PANEL BY RT PCR (FLU A&B, COVID)
Influenza A by PCR: NEGATIVE
Influenza B by PCR: NEGATIVE
SARS Coronavirus 2 by RT PCR: NEGATIVE

## 2019-04-27 MED ORDER — SERTRALINE HCL 50 MG PO TABS
50.0000 mg | ORAL_TABLET | Freq: Every day | ORAL | Status: DC
  Administered 2019-04-28: 01:00:00 50 mg via ORAL
  Filled 2019-04-27 (×2): qty 1

## 2019-04-27 MED ORDER — ALBUTEROL SULFATE (2.5 MG/3ML) 0.083% IN NEBU: 2.5000 mg | INHALATION_SOLUTION | RESPIRATORY_TRACT | Status: DC | PRN

## 2019-04-27 MED ORDER — PANTOPRAZOLE SODIUM 40 MG PO TBEC
40.0000 mg | DELAYED_RELEASE_TABLET | Freq: Every day | ORAL | Status: DC
  Administered 2019-04-27 – 2019-04-29 (×2): 40 mg via ORAL
  Filled 2019-04-27 (×2): qty 1

## 2019-04-27 MED ORDER — ACETAMINOPHEN 650 MG RE SUPP: 650.0000 mg | Freq: Four times a day (QID) | RECTAL | Status: DC | PRN

## 2019-04-27 MED ORDER — SODIUM CHLORIDE 0.9% FLUSH: 3.0000 mL | Freq: Two times a day (BID) | INTRAVENOUS | Status: DC

## 2019-04-27 MED ORDER — AMIODARONE HCL 200 MG PO TABS
200.0000 mg | ORAL_TABLET | Freq: Every day | ORAL | Status: DC
  Administered 2019-04-29: 11:00:00 200 mg via ORAL
  Filled 2019-04-27 (×2): qty 1

## 2019-04-27 MED ORDER — ACETAMINOPHEN 325 MG PO TABS: 650.0000 mg | ORAL_TABLET | Freq: Four times a day (QID) | ORAL | Status: DC | PRN

## 2019-04-27 MED ORDER — POLYVINYL ALCOHOL 1.4 % OP SOLN
1.0000 [drp] | Freq: Two times a day (BID) | OPHTHALMIC | Status: DC
  Administered 2019-04-29: 1 [drp] via OPHTHALMIC
  Filled 2019-04-27 (×2): qty 15

## 2019-04-27 MED ORDER — TAMSULOSIN HCL 0.4 MG PO CAPS
0.4000 mg | ORAL_CAPSULE | Freq: Every day | ORAL | Status: DC
  Administered 2019-04-28: 01:00:00 0.4 mg via ORAL
  Filled 2019-04-27 (×3): qty 1

## 2019-04-27 MED ORDER — PRAVASTATIN SODIUM 20 MG PO TABS
20.0000 mg | ORAL_TABLET | Freq: Every day | ORAL | Status: DC
  Administered 2019-04-28: 18:00:00 20 mg via ORAL
  Filled 2019-04-27: qty 1

## 2019-04-27 MED ORDER — TRAZODONE HCL 50 MG PO TABS
50.0000 mg | ORAL_TABLET | Freq: Every day | ORAL | Status: DC
  Administered 2019-04-27: 50 mg via ORAL
  Filled 2019-04-27 (×2): qty 1

## 2019-04-27 MED ORDER — LEVOTHYROXINE SODIUM 88 MCG PO TABS
88.0000 ug | ORAL_TABLET | Freq: Every day | ORAL | Status: DC
  Filled 2019-04-27 (×3): qty 1

## 2019-04-27 MED ORDER — PRAZOSIN HCL 5 MG PO CAPS
10.0000 mg | ORAL_CAPSULE | Freq: Every day | ORAL | Status: DC
  Administered 2019-04-28: 01:00:00 10 mg via ORAL
  Filled 2019-04-27 (×3): qty 2

## 2019-04-27 NOTE — ED Notes (Signed)
Pt given blanket.

## 2019-04-27 NOTE — ED Notes (Signed)
Pt eating dinner tray °

## 2019-04-27 NOTE — H&P (Signed)
History and Physical    DAELAN GATT ZOX:[id number] DOB: 06-08-1940 DOA: 04/27/2019  PCP: [facility]  Patient coming from: Cardiology office  I have personally briefly reviewed patient's old medical records in [place]  Chief Complaint: Pericardial effusion  HPI: CALYB MCQUARRIE is a 79 y.o. male with medical history significant for paroxysmal atrial fibrillation on Eliquis and amiodarone, adenocarcinoma of the right lung, COPD, hypertension, hyperlipidemia, remote prostate cancer, agent orange exposure, and hypothyroidism who presents to the ED from the cardiology clinic for worsening pericardial effusion.  Patient recently hospitalized 04/15/2019-04/21/2019 at [facility] for atrial fibrillation with RVR.  He has been started on Eliquis and amiodarone.  Echocardiograms admission showed a moderate pericardial effusion.  He was readmitted on 04/22/2019-04/23/2019 for chest discomfort.  He had mildly elevated troponin which was felt related to demand ischemia.  He was cleared for discharge by cardiology.  He had follow-up at [facility] today with a reportedly abnormal echocardiogram and was sent to cardiology clinic for further evaluation.  Review of echocardiogram imaging apparently showed worsening pericardial effusion and patient was therefore sent to the ED for admission for likely drainage.  Patient states that since his initial hospitalization for atrial fibrillation he has had a central chest fullness but not a heavy pain.  He has not had any palpitations.  He has been experiencing dyspnea with exertion and difficulty performing deep inspiration.  He denies any subjective fevers, chills, diaphoresis, abdominal pain.  He has not seen any obvious bleeding.  ED Course:  Initial vitals showed BP 126/75, pulse 75, RR 18, temp 98.2 Fahrenheit, SPO2 98% on room air.  Labs show WBC 7.0, hemoglobin 11.6, platelets 385,000, sodium 140, potassium 4.3, bicarb 30, BUN  12, creatinine 0.96, high-sensitivity troponin I 121.  SARS-CoV-2 test was ordered and pending.  2 view chest x-ray shows persistent small right pleural effusion with basilar atelectasis.  EDP discussed the case with on-call cardiothoracic surgery and cardiology. Cardiology recommended admission to assess in a.m. for possible drainage or CT surgery pericardial window. The hospitalist service was consulted to admit for further evaluation and management.  Review of Systems: All systems reviewed and are negative except as documented in history of present illness above.   Past Medical History:  Diagnosis Date  . Agent orange exposure   . Asthma    exercise induced asthma   . COPD (chronic obstructive pulmonary disease) ([place])   . DJD (degenerative joint disease)    a. 02/2019 s/p L TKA.  Marland Kitchen GERD (gastroesophageal reflux disease)   . Hearing loss    wears hearing aids  . History of cardiac cath    a. History of 2 cardiac catheterizations, last ~ 10 yrs ago @ [facility]-->reportedly nl.  . History of stress test    a. 2020 - pharmacologic stress testing @ [facility]-->reportedly nl.  . Hyperlipidemia   . Hypertension   . Hypothyroidism   . Insomnia   . Lung cancer ([place])    a. Dx 03/2019.  Marland Kitchen Prostate cancer ([place])   . PTSD (post-traumatic stress disorder)   . Seasonal allergies   . Thyroid disease     Past Surgical History:  Procedure Laterality Date  . BACK SURGERY     x 2  . COLONOSCOPY     hx polyps  . HERNIA REPAIR    . INCISE AND DRAIN ABCESS     patient denies this procedure 03/03/19  . NECK SURGERY     x 1  .  TONSILLECTOMY    . 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 TOTAL KNEE ARTHROPLASTY Left 03/09/2019   Procedure: LEFT TOTAL KNEE ARTHROPLASTY;  Surgeon: Meredith Pel, MD;  Location: [facility];  Service: Orthopedics;  Laterality: Left;  . WISDOM TOOTH EXTRACTION      Social History:  reports that he quit smoking about 39 years ago. His smoking use included  cigarettes. He has a 40.00 pack-year smoking history. He has never used smokeless tobacco. He reports that he does not drink alcohol or use drugs.  Allergies  Allergen Reactions  . Bee Venom Anaphylaxis  . Feldene [Piroxicam] Hives    Family History  Problem Relation Age of Onset  . Breast cancer Mother        w/ brain mets -> died in her 3's.  Marland Kitchen Heart attack Father        died @ 34  . Other Brother        multiple medical issues, pt unaware of specifics.     Prior to Admission medications   Medication Sig Start Date End Date Taking? Authorizing Provider  alum & mag hydroxide-simeth (MAALOX/MYLANTA) 200-200-20 MG/5ML suspension Take 30 mLs by mouth every 6 (six) hours as needed for indigestion or heartburn. 04/23/19   Thornell Mule, MD  amiodarone (PACERONE) 100 MG tablet Take 2 tablets (200 mg total) by mouth 2 (two) times daily for 7 days, THEN 2 tablets (200 mg total) daily. 04/21/19 05/28/19  Pokhrel, Corrie Mckusick, MD  apixaban (ELIQUIS) 5 MG TABS tablet Take 1 tablet (5 mg total) by mouth 2 (two) times daily. 04/21/19   Pokhrel, Corrie Mckusick, MD  Calcium Carbonate-Vitamin D 600-400 MG-UNIT tablet Take 1 tablet by mouth 2 (two) times daily.     [provider]  cetirizine (ZYRTEC) 10 MG tablet Take 10 mg by mouth daily.  02/13/18   [provider]  folic acid (FOLVITE) 1 MG tablet Take 1 mg by mouth daily.    [provider]  guaifenesin (HUMIBID E) 400 MG TABS tablet Take 400 mg by mouth 3 (three) times daily.    [provider]  levothyroxine (SYNTHROID) 88 MCG tablet Take 88 mcg by mouth daily before breakfast.    [provider]  omeprazole (PRILOSEC) 20 MG capsule Take 20 mg by mouth 2 (two) times daily as needed (acid reflux/indigestion.).  10/17/18   [provider]  ondansetron (ZOFRAN ODT) 4 MG disintegrating tablet Take 1 tablet (4 mg total) by mouth every 8 (eight) hours as needed for nausea or vomiting. 04/23/19   Thornell Mule, MD    polyethylene glycol (MIRALAX / GLYCOLAX) 17 g packet Take 17 g by mouth daily. 04/21/19   Pokhrel, Corrie Mckusick, MD  pravastatin (PRAVACHOL) 20 MG tablet Take 20 mg by mouth at bedtime.    [provider]  prazosin (MINIPRESS) 5 MG capsule Take 10 mg by mouth at bedtime.  02/21/18   [provider]  REFRESH TEARS 0.5 % SOLN Place 1 drop into both eyes 2 (two) times daily.  02/13/18   [provider]  sertraline (ZOLOFT) 50 MG tablet Take 50 mg by mouth at bedtime.  12/14/18   [provider]  tamsulosin (FLOMAX) 0.4 MG CAPS capsule Take 0.4 mg by mouth daily.  01/06/19   [provider]  traZODone (DESYREL) 50 MG tablet Take 50 mg by mouth at bedtime.    [provider]    Physical Exam: Vitals:   04/27/19 1619 04/27/19 2106 04/27/19 2300 04/27/19  2330  BP: 126/75 (!) 159/90 137/78 137/79  Pulse: 75 70 77 77  Resp: 18 18 19  (!) 23  Temp: 98.2 F (36.8 C)     TempSrc: Oral     SpO2: 98% 98% 96% 96%  Weight:      Height:        Constitutional: Resting supine in bed, NAD, calm, comfortable Eyes: PERRL, lids and conjunctivae normal ENMT: Mucous membranes are moist. Posterior pharynx clear of any exudate or lesions.Normal dentition.  Neck: normal, supple, no masses. Respiratory: Expiratory wheezing bilaterally.  Normal respiratory effort. No accessory muscle use.  Cardiovascular: Regular rate and rhythm, no murmurs / rubs / gallops. No extremity edema. 2+ pedal pulses. Abdomen: no tenderness, no masses palpated. No hepatosplenomegaly. Bowel sounds positive.  Musculoskeletal: no clubbing / cyanosis. No joint deformity upper and lower extremities. Good ROM, no contractures. Normal muscle tone.  Skin: no rashes, lesions, ulcers. No induration Neurologic: CN 2-12 grossly intact. Sensation intact, Strength 5/5 in all 4.  Psychiatric: Normal judgment and insight. Alert and oriented x 3. Normal mood.   Labs on Admission: I have personally reviewed  following labs and imaging studies  CBC: Recent Labs  Lab 04/22/19 1233 04/27/19 1624  WBC 6.0 7.0  HGB 12.0* 11.6*  HCT 35.4* 36.5*  MCV 89.4 95.3  PLT 350 194   Basic Metabolic Panel: Recent Labs  Lab 04/22/19 1233 04/27/19 1624  NA 138 140  K 4.0 4.3  CL 103 104  CO2 26 30  GLUCOSE 110* 111*  BUN 16 12  CREATININE 0.96 0.96  CALCIUM 10.6* 10.3   GFR: Estimated Creatinine Clearance: 61.4 mL/min (by C-G formula based on SCr of 0.96 mg/dL). Liver Function Tests: Recent Labs  Lab 04/22/19 1233  AST 21  ALT 17  ALKPHOS 99  BILITOT 0.5  PROT 7.1  ALBUMIN 3.4*   Recent Labs  Lab 04/22/19 1233  LIPASE 38   No results for input(s): AMMONIA in the last 168 hours. Coagulation Profile: Recent Labs  Lab 04/22/19 2107  INR 1.2   Cardiac Enzymes: No results for input(s): CKTOTAL, CKMB, CKMBINDEX, TROPONINI in the last 168 hours. BNP (last 3 results) No results for input(s): PROBNP in the last 8760 hours. HbA1C: No results for input(s): HGBA1C in the last 72 hours. CBG: No results for input(s): GLUCAP in the last 168 hours. Lipid Profile: No results for input(s): CHOL, HDL, LDLCALC, TRIG, CHOLHDL, LDLDIRECT in the last 72 hours. Thyroid Function Tests: No results for input(s): TSH, T4TOTAL, FREET4, T3FREE, THYROIDAB in the last 72 hours. Anemia Panel: No results for input(s): VITAMINB12, FOLATE, FERRITIN, TIBC, IRON, RETICCTPCT in the last 72 hours. Urine analysis:    Component Value Date/Time   COLORURINE YELLOW (A) 04/22/2019 1916   APPEARANCEUR CLEAR (A) 04/22/2019 1916   LABSPEC >1.046 (H) 04/22/2019 1916   PHURINE 5.0 04/22/2019 1916   GLUCOSEU NEGATIVE 04/22/2019 1916   HGBUR NEGATIVE 04/22/2019 1916   BILIRUBINUR NEGATIVE 04/22/2019 1916   KETONESUR NEGATIVE 04/22/2019 1916   PROTEINUR 30 (A) 04/22/2019 1916   UROBILINOGEN 1.0 05/22/2009 1525   NITRITE NEGATIVE 04/22/2019 1916   LEUKOCYTESUR NEGATIVE 04/22/2019 1916    Radiological Exams on  Admission: DG Chest 2 View  Result Date: 04/27/2019 CLINICAL DATA:  Chest pressure shortness of breath for 1 week, recently diagnosed with lung cancer, getting PET scan tomorrow, former smoker, hypertension EXAM: CHEST - 2 VIEW COMPARISON:  04/17/2019 FINDINGS: Normal heart size, mediastinal contours, and pulmonary vascularity. Small RIGHT pleural  effusion and basilar atelectasis. Remaining lungs clear. No LEFT pleural effusion, or evidence of pneumothorax. Bones demineralized with prior cervicothoracic fusion. IMPRESSION: Persistent small RIGHT pleural effusion and basilar atelectasis. Electronically Signed   By: Lavonia Dana M.D.   On: 04/27/2019 17:19    EKG: Independently reviewed. Sinus rhythm, T wave flattening limb leads. Compared to prior PR interval has decreased.  Assessment/Plan Principal Problem:   Pericardial effusion Active Problems:   Adenocarcinoma of right lung (HCC)   Paroxysmal atrial fibrillation (HCC)   Hypothyroidism   Hyperlipidemia  EILAM SHREWSBURY is a 79 y.o. male with medical history significant for paroxysmal atrial fibrillation on Eliquis and amiodarone, adenocarcinoma of the right lung, COPD, hypertension, hyperlipidemia, remote prostate cancer, and hypothyroidism who is admitted for management of pericardial effusion.   Pericardial effusion: Per outpatient cardiology documentation, has worsened since previous echocardiogram.  Patient currently hemodynamically stable.  Cardiology consult by EDP and to see in a.m. to determine further management plans.  Paroxysmal atrial fibrillation: In sinus rhythm at time of admission.  Continue amiodarone 200 mg daily.  Holding home Eliquis for anticipated procedure tomorrow.  Adenocarcinoma of the right lung: Follows with oncology, Dr. Grayland Ormond.  Plan to undergo PET scan 04/28/2019 for further evaluation.  Hypothyroidism: Continue Synthroid.  Hyperlipidemia: Continue pravastatin.  PTSD/anxiety: Continue sertraline,  trazodone, and prazosin.  DVT prophylaxis: SCDs Code Status: Full code, confirmed with patient Family Communication: Discussed with patient, he has discussed with family Disposition Plan: Pending management of pericardial effusion Consults called: EDP discussed with cardiology and CT surgery Admission status: Admit - It is my clinical opinion that admission to INPATIENT is reasonable and necessary because of the expectation that this patient will require hospital care that crosses at least 2 midnights to treat this condition based on the medical complexity of the problems presented.  Given the aforementioned information, the predictability of an adverse outcome is felt to be significant.      Zada Finders MD [facility]  If 7PM-7AM, please contact night-coverage [email or web address]  04/28/2019, 12:15 AM

## 2019-04-27 NOTE — ED Notes (Signed)
Report given to Amy RN, pt given meal tray

## 2019-04-27 NOTE — ED Notes (Signed)
Patient resting, rounded to give medications, patient says he prefers to take his meds all together so will wait until other meds are sent up from pharmacy

## 2019-04-27 NOTE — ED Notes (Signed)
Dr. Paduchowski at bedside.  

## 2019-04-27 NOTE — ED Provider Notes (Signed)
[facility] Emergency Department Provider Note  Time seen: 4:38 PM  I have reviewed the triage vital signs and the nursing notes.   HISTORY  Chief Complaint Pericardial effusion   HPI Carlos Barrera is a 79 y.o. male with a past medical history of COPD, lung cancer, hypertension, hyperlipidemia, presents to the emergency department from cardiology for a pericardial effusion.  According to the patient over the past several weeks he has been feeling more short of breath especially with exertion states mild chest pressure as well.  Patient was seen by cardiology today had an echocardiogram showing a moderate pericardial effusion and was sent to the emergency department for "drainage."  Patient denies any chest pain currently denies any shortness of breath currently states as long as he is resting he does not have any symptoms.  Denies any lower extremity swelling.  Denies any recent fever increased cough or congestion.   Past Medical History:  Diagnosis Date  . Agent orange exposure   . Asthma    exercise induced asthma   . COPD (chronic obstructive pulmonary disease) ([place])   . DJD (degenerative joint disease)    a. 02/2019 s/p L TKA.  Carlos Barrera GERD (gastroesophageal reflux disease)   . Hearing loss    wears hearing aids  . History of cardiac cath    a. History of 2 cardiac catheterizations, last ~ 10 yrs ago @ [facility]-->reportedly nl.  . History of stress test    a. 2020 - pharmacologic stress testing @ [facility]-->reportedly nl.  . Hyperlipidemia   . Hypertension   . Hypothyroidism   . Insomnia   . Lung cancer ([place])    a. Dx 03/2019.  Carlos Barrera Prostate cancer ([place])   . PTSD (post-traumatic stress disorder)   . Seasonal allergies   . Thyroid disease     Patient Active Problem List   Diagnosis Date Noted  . Goals of care, counseling/discussion 04/25/2019  . Prostate cancer ([place]) 04/25/2019  . Intractable vomiting   . Nonspecific chest pain 04/22/2019  . Adenocarcinoma  of right lung ([place]) 04/16/2019  . New onset atrial fibrillation ([place]) 04/16/2019  . Atrial fibrillation with RVR ([place]) 04/16/2019  . Hypotension 04/16/2019  . Arthritis of left knee 03/09/2019    Past Surgical History:  Procedure Laterality Date  . BACK SURGERY     x 2  . COLONOSCOPY     hx polyps  . HERNIA REPAIR    . INCISE AND DRAIN ABCESS     patient denies this procedure 03/03/19  . NECK SURGERY     x 1  . TONSILLECTOMY    . 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 TOTAL KNEE ARTHROPLASTY Left 03/09/2019   Procedure: LEFT TOTAL KNEE ARTHROPLASTY;  Surgeon: Meredith Pel, MD;  Location: [facility];  Service: Orthopedics;  Laterality: Left;  . WISDOM TOOTH EXTRACTION      Prior to Admission medications   Medication Sig Start Date End Date Taking? Authorizing Provider  alum & mag hydroxide-simeth (MAALOX/MYLANTA) 200-200-20 MG/5ML suspension Take 30 mLs by mouth every 6 (six) hours as needed for indigestion or heartburn. 04/23/19   Thornell Mule, MD  amiodarone (PACERONE) 100 MG tablet Take 2 tablets (200 mg total) by mouth 2 (two) times daily for 7 days, THEN 2 tablets (200 mg total) daily. 04/21/19 05/28/19  Pokhrel, Corrie Mckusick, MD  apixaban (ELIQUIS) 5 MG TABS tablet Take 1 tablet (5 mg total) by mouth 2 (two) times daily. 04/21/19   Pokhrel, [place],  MD  Calcium Carbonate-Vitamin D 600-400 MG-UNIT tablet Take 1 tablet by mouth 2 (two) times daily.     [provider]  cetirizine (ZYRTEC) 10 MG tablet Take 10 mg by mouth daily.  02/13/18   [provider]  folic acid (FOLVITE) 1 MG tablet Take 1 mg by mouth daily.    [provider]  guaifenesin (HUMIBID E) 400 MG TABS tablet Take 400 mg by mouth 3 (three) times daily.    [provider]  levothyroxine (SYNTHROID) 88 MCG tablet Take 88 mcg by mouth daily before breakfast.    [provider]  omeprazole (PRILOSEC) 20 MG capsule Take 20 mg by mouth 2 (two) times daily as needed (acid  reflux/indigestion.).  10/17/18   [provider]  ondansetron (ZOFRAN ODT) 4 MG disintegrating tablet Take 1 tablet (4 mg total) by mouth every 8 (eight) hours as needed for nausea or vomiting. 04/23/19   Thornell Mule, MD  polyethylene glycol (MIRALAX / GLYCOLAX) 17 g packet Take 17 g by mouth daily. 04/21/19   Pokhrel, Corrie Mckusick, MD  pravastatin (PRAVACHOL) 20 MG tablet Take 20 mg by mouth at bedtime.    [provider]  prazosin (MINIPRESS) 5 MG capsule Take 10 mg by mouth at bedtime.  02/21/18   [provider]  REFRESH TEARS 0.5 % SOLN Place 1 drop into both eyes 2 (two) times daily.  02/13/18   [provider]  sertraline (ZOLOFT) 50 MG tablet Take 50 mg by mouth at bedtime.  12/14/18   [provider]  tamsulosin (FLOMAX) 0.4 MG CAPS capsule Take 0.4 mg by mouth daily.  01/06/19   [provider]  traZODone (DESYREL) 50 MG tablet Take 50 mg by mouth at bedtime.    [provider]    Allergies  Allergen Reactions  . Bee Venom Anaphylaxis  . Feldene [Piroxicam] Hives    Family History  Problem Relation Age of Onset  . Breast cancer Mother        w/ brain mets -> died in her 72's.  Carlos Barrera Heart attack Father        died @ 23  . Other Brother        multiple medical issues, pt unaware of specifics.    Social History Social History   Tobacco Use  . Smoking status: Former Smoker    Packs/day: 2.00    Years: 20.00    Pack years: 40.00    Types: Cigarettes    Quit date: 04/20/1980    Years since quitting: 39.0  . Smokeless tobacco: Never Used  Substance Use Topics  . Alcohol use: No  . Drug use: No    Review of Systems Constitutional: Negative for fever. Cardiovascular: Intermittent mild chest pressure Respiratory: Shortness of breath with exertion.  Negative for increased cough. Gastrointestinal: Negative for abdominal pain Musculoskeletal: Negative for musculoskeletal complaints Neurological: Negative for headache All  other ROS negative  ____________________________________________   PHYSICAL EXAM:  VITAL SIGNS: ED Triage Vitals  Enc Vitals Group     BP 04/27/19 1619 126/75     Pulse Rate 04/27/19 1619 75     Resp 04/27/19 1619 18     Temp 04/27/19 1619 98.2 F (36.8 C)     Temp Source 04/27/19 1619 Oral     SpO2 04/27/19 1619 98 %     Weight 04/27/19 1616 158 lb (71.7 kg)     Height 04/27/19 1616 5\' 8"  (1.727 m)     Head  Circumference --      Peak Flow --      Pain Score 04/27/19 1616 5     Pain Loc --      Pain Edu? --      Excl. in [place]? --    Constitutional: Alert and oriented. Well appearing and in no distress. Eyes: Normal exam ENT      Head: Normocephalic and atraumatic.      Mouth/Throat: Mucous membranes are moist. Cardiovascular: Normal rate, regular rhythm. No murmur Respiratory: Mild expiratory wheeze, normal respiratory effort.  No rales or rhonchi. Gastrointestinal: Soft and nontender. No distention.   Musculoskeletal: Nontender with normal range of motion in all extremities. No lower extremity tenderness or edema. Neurologic:  Normal speech and language. No gross focal neurologic deficits  Skin:  Skin is warm, dry and intact.  Psychiatric: Mood and affect are normal.   ____________________________________________    EKG  EKG viewed and interpreted by myself shows a normal sinus rhythm at 77 bpm with a narrow QRS, normal axis, normal intervals, no concerning ST changes.  ____________________________________________    RADIOLOGY   IMPRESSION:  Persistent small RIGHT pleural effusion and basilar atelectasis.   ____________________________________________   INITIAL IMPRESSION / ASSESSMENT AND PLAN / ED COURSE  Pertinent labs & imaging results that were available during my care of the patient were reviewed by me and considered in my medical decision making (see chart for details).   Patient presents to the emergency department for a pericardial effusion found  at cardiology's office by an echocardiogram and told to come here for "drainage."  Overall the patient appears well, no distress, reassuring vitals.  Given his complaints of intermittent chest pressure we will obtain labs including cardiac enzymes and a chest x-ray.  I will discussed with Dr. Faith Rogue as well as Dr. Saunders Revel to see if we can figure out a plan for this patient.  Currently the patient appears quite well, no distress or discomfort at this time.  I discussed patient of Dr. Faith Rogue who states that he could do a pericardial window if needed however typically these patients are usually treated by cardiology with an ultrasound-guided drainage.  Discussed patient with cardiology on-call for Dr. And who is currently [facility] but they state keep the patient n.p.o. after midnight, did not dose his evening Eliquis and they will have cardiology see in the a.m. to arrange for drainage or CT surgery pericardial window.  We will discussed with the hospitalist for admission.  Patient's labs do show an elevated troponin I 121, although not significantly elevated from prior levels.  Carlos Barrera was evaluated in Emergency Department on 04/27/2019 for the symptoms described in the history of present illness. He was evaluated in the context of the global COVID-19 pandemic, which necessitated consideration that the patient might be at risk for infection with the SARS-CoV-2 virus that causes COVID-19. Institutional protocols and algorithms that pertain to the evaluation of patients at risk for COVID-19 are in a state of rapid change based on information released by regulatory bodies including the CDC and federal and state organizations. These policies and algorithms were followed during the patient's care in the ED.  ____________________________________________   FINAL CLINICAL IMPRESSION(S) / ED DIAGNOSES  Pericardial effusion   Harvest Dark, MD 04/27/19 1759

## 2019-04-27 NOTE — ED Triage Notes (Signed)
Patient had imaging done at [facility] and reports it showed cardiac tamponade. C/o chest pressure X1 week.

## 2019-04-27 NOTE — ED Notes (Signed)
Date and time results received: 04/27/19 5:26 PM  Test: Troponin Critical Value: 121  Name of Provider Notified: [facility]

## 2019-04-27 NOTE — Progress Notes (Signed)
Office Visit    Patient Name: Carlos Barrera Date of Encounter: 04/27/2019  Primary Care Provider:  [facility] Primary Cardiologist:  Kate Sable, MD Electrophysiologist:  None   Chief Complaint    Carlos Barrera is a 79 y.o. male with a hx of atrial fibrillation, COPD, HTN, HLD, agent orange exposure, lung cancer, remote prostate cancer, hypothyroidism, remote tobacco abuse (40 pack years, quit 1982) presents today for visit after abnormal echo at [facility].  Past Medical History    Past Medical History:  Diagnosis Date  . Agent orange exposure   . Asthma    exercise induced asthma   . COPD (chronic obstructive pulmonary disease) ([place])   . DJD (degenerative joint disease)    a. 02/2019 s/p L TKA.  Marland Kitchen GERD (gastroesophageal reflux disease)   . Hearing loss    wears hearing aids  . History of cardiac cath    a. History of 2 cardiac catheterizations, last ~ 10 yrs ago @ [facility]-->reportedly nl.  . History of stress test    a. 2020 - pharmacologic stress testing @ [facility]-->reportedly nl.  . Hyperlipidemia   . Hypertension   . Hypothyroidism   . Insomnia   . Lung cancer ([place])    a. Dx 03/2019.  Marland Kitchen Prostate cancer ([place])   . PTSD (post-traumatic stress disorder)   . Seasonal allergies   . Thyroid disease    Past Surgical History:  Procedure Laterality Date  . BACK SURGERY     x 2  . COLONOSCOPY     hx polyps  . HERNIA REPAIR    . INCISE AND DRAIN ABCESS     patient denies this procedure 03/03/19  . NECK SURGERY     x 1  . TONSILLECTOMY    . 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 TOTAL KNEE ARTHROPLASTY Left 03/09/2019   Procedure: LEFT TOTAL KNEE ARTHROPLASTY;  Surgeon: Meredith Pel, MD;  Location: [place];  Service: Orthopedics;  Laterality: Left;  . WISDOM TOOTH EXTRACTION      Allergies  Allergies  Allergen Reactions  . Bee Venom Anaphylaxis  . Feldene [Piroxicam] Hives    History of Present Illness    Carlos Barrera  is a 79 y.o. male with a hx of atrial fibrillation, COPD, HTN, HLD, agent orange exposure, lung cancer, remote prostate cancer, hypothyroidism, remote tobacco abuse (40 pack years, quit 1982) last seen while hospitalized.  04/15/19 admitted for new onset atrial fib. Transitioned to amiodarone and Eliquis. Held off on beta blocker secondary to hypotension. Noted moderate pericardial effusion.   Readmitted 04/22/19-04/23/19 for chest pain and nausea. Troponin elevation thought to be due to demand ischemia. F/u was scheduled for 05/08/19.   Presents today after being told by an outside cardiologist at the [facility] to be seen urgently after having an echocardiogram. The echo was previously scheduled prior to his hospitalization and he was unable to reach them to cancel.   Reports feeling chest pressure and feels as if he needs to belch since discharge. Reports he is "slow to swallow" but can swallow. Reports continued SOB at rest and DOE - tells me he can't hardly walk across the room because of his breathing.   Tells me the first remark at [facility] today was by a cardiologist and told he needed to go to the ED. He tells me they checked his blood pressure while he held his breath - likely paradoxical pressures and changed their recommendation to  have him be seen in the office.   Dr. Saunders Revel reviewed the echo images from today - preliminary review shows the pericardial effusion has increased in size and there were physiologic signs of possible tamponade when compared to echo 04/16/19.  He has PET scan scheduled at [facility] tomorrow 04/28/19 at 1:00 PM. This has already been rescheduled and he is worried about getting it done.   Someone else does his pill box for him. He is not clear on what medications he is taking.   EKGs/Labs/Other Studies Reviewed:   The following studies were reviewed today:  Echo 04/16/19  1. Left ventricular ejection fraction, by visual estimation, is 60 to 65%. The left ventricle has normal  function. There is no left ventricular hypertrophy.  2. The left ventricle has no regional wall motion abnormalities.  3. Global right ventricle has normal systolic function.The right ventricular size is normal. No increase in right ventricular wall thickness.  4. Left atrial size was mildly dilated.  5. Right atrial size was normal.  6. Moderate pericardial effusion.  7. The pericardial effusion is anterior to the right ventricle.  8. Mild mitral annular calcification.  9. The mitral valve is normal in structure. No evidence of mitral valve regurgitation. No evidence of mitral stenosis. 10. The tricuspid valve is normal in structure. 11. The aortic valve is normal in structure. Aortic valve regurgitation is not visualized. Mild aortic valve sclerosis without stenosis. 12. The pulmonic valve was normal in structure. Pulmonic valve regurgitation is not visualized. 13. The inferior vena cava is normal in size with greater than 50% respiratory variability, suggesting right atrial pressure of 3 mmHg. 14. No prior Echocardiogram.  EKG:  EKG is ordered today.  The ekg ordered today demonstrates SR rate 82 bpm  Recent Labs: 04/20/2019: Magnesium 1.7 04/22/2019: ALT 17; BUN 16; Creatinine, Ser 0.96; Hemoglobin 12.0; Platelets 350; Potassium 4.0; Sodium 138 04/23/2019: TSH 37.147  Recent Lipid Panel No results found for: CHOL, TRIG, HDL, CHOLHDL, VLDL, LDLCALC, LDLDIRECT  Home Medications   Current Meds  Medication Sig  . alum & mag hydroxide-simeth (MAALOX/MYLANTA) 200-200-20 MG/5ML suspension Take 30 mLs by mouth every 6 (six) hours as needed for indigestion or heartburn.  Marland Kitchen amiodarone (PACERONE) 100 MG tablet Take 2 tablets (200 mg total) by mouth 2 (two) times daily for 7 days, THEN 2 tablets (200 mg total) daily.  Marland Kitchen apixaban (ELIQUIS) 5 MG TABS tablet Take 1 tablet (5 mg total) by mouth 2 (two) times daily.  Marland Kitchen aspirin EC 81 MG tablet Take 81 mg by mouth daily.  . Calcium Carbonate-Vitamin D  600-400 MG-UNIT tablet Take 1 tablet by mouth 2 (two) times daily.   . cetirizine (ZYRTEC) 10 MG tablet Take 10 mg by mouth daily.   . folic acid (FOLVITE) 1 MG tablet Take 1 mg by mouth daily.  Marland Kitchen guaifenesin (HUMIBID E) 400 MG TABS tablet Take 400 mg by mouth 3 (three) times daily.  Marland Kitchen levothyroxine (SYNTHROID) 88 MCG tablet Take 88 mcg by mouth daily before breakfast.  . omeprazole (PRILOSEC) 20 MG capsule Take 20 mg by mouth 2 (two) times daily as needed (acid reflux/indigestion.).   Marland Kitchen ondansetron (ZOFRAN ODT) 4 MG disintegrating tablet Take 1 tablet (4 mg total) by mouth every 8 (eight) hours as needed for nausea or vomiting.  . polyethylene glycol (MIRALAX / GLYCOLAX) 17 g packet Take 17 g by mouth daily.  . pravastatin (PRAVACHOL) 20 MG tablet Take 20 mg by mouth at bedtime.  Marland Kitchen  prazosin (MINIPRESS) 5 MG capsule Take 10 mg by mouth at bedtime.   Marland Kitchen REFRESH TEARS 0.5 % SOLN Place 1 drop into both eyes 2 (two) times daily.   . sertraline (ZOLOFT) 50 MG tablet Take 50 mg by mouth at bedtime.   . tamsulosin (FLOMAX) 0.4 MG CAPS capsule Take 0.4 mg by mouth daily.   . traZODone (DESYREL) 50 MG tablet Take 50 mg by mouth at bedtime.   Current Facility-Administered Medications for the 04/27/19 encounter (Office Visit) with Loel Dubonnet, NP  Medication  . Leuprolide Acetate (6 Month) (LUPRON) injection 45 mg     Review of Systems    Review of Systems  Constitution: Positive for malaise/fatigue. Negative for chills and fever.  Cardiovascular: Positive for dyspnea on exertion. Negative for chest pain, irregular heartbeat, leg swelling, near-syncope, orthopnea, palpitations and syncope.  Respiratory: Positive for cough, shortness of breath and sputum production. Negative for wheezing.   Gastrointestinal: Positive for dysphagia. Negative for melena, nausea and vomiting.  Genitourinary: Negative for hematuria.  Neurological: Negative for dizziness, light-headedness and weakness.   All other  systems reviewed and are otherwise negative except as noted above.  Physical Exam    VS:  BP 120/70 (BP Location: Left Arm, Patient Position: Sitting, Cuff Size: Normal)   Pulse 82   Ht 5\' 8"  (1.727 m)   Wt 158 lb 8 oz (71.9 kg)   SpO2 98%   BMI 24.10 kg/m  , BMI Body mass index is 24.1 kg/m. GEN: Well nourished, well developed, in no acute distress. HEENT: normal. Neck: Supple, no JVD, carotid bruits, or masses. Cardiac: RRR, no murmurs, rubs, or gallops. No clubbing, cyanosis, edema.  Radials/PT 2+ and equal bilaterally.  Respiratory:  Respirations regular and unlabored. Non productive cough noted. Inspiratory wheeze to right upper and lower lobe.  GI: Soft, nontender, nondistended. MS: No deformity or atrophy. Skin: Warm and dry, no rash. Neuro:  Strength and sensation are intact. Psych: Normal affect.  Accessory Clinical Findings    ECG personally reviewed by me today - SR 82 bpm with nonspecific ST/T wave changes - no acute changes.  Assessment & Plan    1. Pericardial effusion - First noted on echo 04/16/19. Presented today with echo from [facility] today. Preliminary review by Dr. Saunders Revel shows worsening pericardial effusion. Noted inspiratory wheeze to right upper and lower lobe on exam. He is symptomatic with worsening shortness of breath, DOE, fatigue. Will require admission for drainage.   2. Paroxysmal atrial fibrillation - Recent diagnosis. On amiodarone therapy. No beta blocker secondary presently to hypotension. EKG today shows SR 82 bpm. Denies palpitations.   3. Long term current use of anticoagulation - Secondary to atrial fibrillation. Denies bleeding complications. Continue Eliquis 5mg  BID.   4. Lung cancer -  Recent diagnosis. Has PET scheduled at [facility] tomorrow at 1pm. Likely etiology of his pericardial effusion. He is agreeable to proceed to ED today. Discussed that may have to reschedule this scan again. If possible, admit to [facility] but this is depending on bed  availability.  5. Hypothyroidism - Follows with PCP at [place]. Synthroid recently decreased from 168mcg to 88 mcg. TSH have been elevated on recent lab draws (12/26 was 26 and 1/3 was 37).  Disposition: Sent to ED for admission for anticipated drainage of pericardial effusion. Report was called to ED charge nurse. He was escorted to the ED by RN. Follow up in 2 week(s) with Dr. Garen Lah or APP.    Martie Lee  Gilford Rile, NP 04/27/2019, 3:54 PM

## 2019-04-27 NOTE — Progress Notes (Signed)
Post-Op Visit Note   Patient: Carlos Barrera           Date of Birth: 1940/08/10           MRN: [id number] Visit Date: 04/26/2019 PCP: [facility] [place]:  Chief Complaint:  Chief Complaint  Patient presents with  . Left Knee - Follow-up    03/09/2019 Left TKA   Visit Diagnoses:  1. Status post total left knee replacement     Plan: Carlos Barrera is a patient who is now about 2 months out left total knee replacement.  He is doing well.  He is walking well.  Does have some limitation of walking endurance due to lung issues.  PET scan pending for tomorrow.  He has been hospitalized twice since his last visit but that is for lung issues.  His left knee is doing very well.  He is very happy with the surgical result.  On examination he has range of motion from 0-1 30.  Good stability.  Incision intact.  No effusion.  Gait normal.  Plan at this time is that Carlos Barrera is doing very well from his knee replacement.  He is on an excellent job of rehabilitation.  Follow-up with me as needed.  Follow-Up Instructions: Return if symptoms worsen or fail to improve.   Orders:  No orders of the defined types were placed in this encounter.  No orders of the defined types were placed in this encounter.   Imaging: No results found.  PMFS History: Patient Active Problem List   Diagnosis Date Noted  . Goals of care, counseling/discussion 04/25/2019  . Prostate cancer ([place]) 04/25/2019  . Intractable vomiting   . Nonspecific chest pain 04/22/2019  . 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 New onset atrial fibrillation ([place]) 04/16/2019  . Atrial fibrillation with RVR ([place]) 04/16/2019  . Hypotension 04/16/2019  . Arthritis of left knee 03/09/2019   Past Medical History:  Diagnosis Date  . Agent orange exposure   . Asthma    exercise induced asthma   . COPD (chronic obstructive pulmonary disease) ([place])   . DJD (degenerative joint disease)    a. 02/2019 s/p L TKA.    Marland Kitchen GERD (gastroesophageal reflux disease)   . Hearing loss    wears hearing aids  . History of cardiac cath    a. History of 2 cardiac catheterizations, last ~ 10 yrs ago @ [facility]-->reportedly nl.  . History of stress test    a. 2020 - pharmacologic stress testing @ [facility]-->reportedly nl.  . Hyperlipidemia   . Hypertension   . Hypothyroidism   . Insomnia   . Lung cancer ([place])    a. Dx 03/2019.  Marland Kitchen Prostate cancer ([place])   . PTSD (post-traumatic stress disorder)   . Seasonal allergies   . Thyroid disease     Family History  Problem Relation Age of Onset  . Breast cancer Mother        w/ brain mets -> died in her 45's.  Marland Kitchen Heart attack Father        died @ 3  . Other Brother        multiple medical issues, pt unaware of specifics.    Past Surgical History:  Procedure Laterality Date  . BACK SURGERY     x 2  . COLONOSCOPY     hx polyps  . HERNIA REPAIR    . INCISE AND DRAIN ABCESS     patient denies  this procedure 03/03/19  . NECK SURGERY     x 1  . TONSILLECTOMY    . 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 TOTAL KNEE ARTHROPLASTY Left 03/09/2019   Procedure: LEFT TOTAL KNEE ARTHROPLASTY;  Surgeon: Meredith Pel, MD;  Location: [place];  Service: Orthopedics;  Laterality: Left;  . WISDOM TOOTH EXTRACTION     Social History   Occupational History  . Not on file  Tobacco Use  . Smoking status: Former Smoker    Packs/day: 2.00    Years: 20.00    Pack years: 40.00    Types: Cigarettes    Quit date: 04/20/1980    Years since quitting: 39.0  . Smokeless tobacco: Never Used  Substance and Sexual Activity  . Alcohol use: No  . Drug use: No  . Sexual activity: Yes    Birth control/protection: None

## 2019-04-28 ENCOUNTER — Ambulatory Visit (HOSPITAL_COMMUNITY): Admit: 2019-04-28 | Payer: Medicare Other

## 2019-04-28 ENCOUNTER — Encounter
Admission: EM | Disposition: A | Discharge status: Home / Self Care | Payer: Self-pay | Source: Home / Self Care | Attending: Internal Medicine

## 2019-04-28 ENCOUNTER — Encounter: Payer: Self-pay | Admitting: Internal Medicine

## 2019-04-28 ENCOUNTER — Inpatient Hospital Stay (HOSPITAL_COMMUNITY)
Admit: 2019-04-28 | Discharge: 2019-04-28 | Disposition: A | Discharge status: Home / Self Care | Payer: No Typology Code available for payment source | Attending: Cardiovascular Disease | Admitting: Cardiovascular Disease

## 2019-04-28 ENCOUNTER — Other Ambulatory Visit: Payer: Self-pay | Admitting: Nurse Practitioner

## 2019-04-28 DIAGNOSIS — I313 Pericardial effusion (noninflammatory): Principal | ICD-10-CM

## 2019-04-28 DIAGNOSIS — K219 Gastro-esophageal reflux disease without esophagitis: Secondary | ICD-10-CM

## 2019-04-28 DIAGNOSIS — I480 Paroxysmal atrial fibrillation: Secondary | ICD-10-CM

## 2019-04-28 DIAGNOSIS — R1310 Dysphagia, unspecified: Secondary | ICD-10-CM

## 2019-04-28 DIAGNOSIS — R778 Other specified abnormalities of plasma proteins: Secondary | ICD-10-CM

## 2019-04-28 DIAGNOSIS — R0600 Dyspnea, unspecified: Secondary | ICD-10-CM

## 2019-04-28 DIAGNOSIS — E039 Hypothyroidism, unspecified: Secondary | ICD-10-CM

## 2019-04-28 DIAGNOSIS — I3139 Other pericardial effusion (noninflammatory): Secondary | ICD-10-CM

## 2019-04-28 DIAGNOSIS — C3491 Malignant neoplasm of unspecified part of right bronchus or lung: Secondary | ICD-10-CM

## 2019-04-28 HISTORY — PX: PERICARDIOCENTESIS: CATH118255

## 2019-04-28 LAB — BASIC METABOLIC PANEL
Anion gap: 10 (ref 5–15)
BUN: 15 mg/dL (ref 8–23)
CO2: 23 mmol/L (ref 22–32)
Calcium: 9.7 mg/dL (ref 8.9–10.3)
Chloride: 105 mmol/L (ref 98–111)
Creatinine, Ser: 0.9 mg/dL (ref 0.61–1.24)
GFR calc Af Amer: >60 mL/min (ref >60)
GFR calc non Af Amer: >60 mL/min (ref >60)
Glucose, Bld: 111 mg/dL — ABNORMAL HIGH (ref 70–99)
Potassium: 3.4 mmol/L — ABNORMAL LOW (ref 3.5–5.1)
Sodium: 138 mmol/L (ref 135–145)

## 2019-04-28 LAB — CBC
HCT: 32.3 % — ABNORMAL LOW (ref 39.0–52.0)
Hemoglobin: 10.3 g/dL — ABNORMAL LOW (ref 13.0–17.0)
MCH: 29.9 pg (ref 26.0–34.0)
MCHC: 31.9 g/dL (ref 30.0–36.0)
MCV: 93.6 fL (ref 80.0–100.0)
Platelets: 269 10*3/uL (ref 150–400)
RBC: 3.45 MIL/uL — ABNORMAL LOW (ref 4.22–5.81)
RDW: 16.1 % — ABNORMAL HIGH (ref 11.5–15.5)
WBC: 4.2 10*3/uL (ref 4.0–10.5)
nRBC: 0 % (ref 0.0–0.2)

## 2019-04-28 LAB — ECHOCARDIOGRAM LIMITED
Height: 68 in
Weight: 2528 oz

## 2019-04-28 SURGERY — PERICARDIOCENTESIS
Anesthesia: Choice

## 2019-04-28 MED ORDER — FENTANYL CITRATE (PF) 100 MCG/2ML IJ SOLN
INTRAMUSCULAR | Status: AC
  Filled 2019-04-28: qty 2

## 2019-04-28 MED ORDER — FENTANYL CITRATE (PF) 100 MCG/2ML IJ SOLN
INTRAMUSCULAR | Status: DC | PRN
  Administered 2019-04-28 (×2): 25 ug via INTRAVENOUS

## 2019-04-28 MED ORDER — MIDAZOLAM HCL 2 MG/2ML IJ SOLN
INTRAMUSCULAR | Status: AC
  Filled 2019-04-28: qty 2

## 2019-04-28 MED ORDER — POTASSIUM CHLORIDE CRYS ER 20 MEQ PO TBCR: 40.0000 meq | EXTENDED_RELEASE_TABLET | Freq: Once | ORAL | Status: DC

## 2019-04-28 MED ORDER — COLCHICINE 0.6 MG PO TABS
0.6000 mg | ORAL_TABLET | Freq: Two times a day (BID) | ORAL | Status: DC
  Administered 2019-04-29: 11:00:00 0.6 mg via ORAL
  Filled 2019-04-28 (×2): qty 1

## 2019-04-28 MED ORDER — SODIUM CHLORIDE 0.9% FLUSH: 3.0000 mL | Freq: Two times a day (BID) | INTRAVENOUS | Status: DC

## 2019-04-28 MED ORDER — HEPARIN (PORCINE) IN NACL 1000-0.9 UT/500ML-% IV SOLN
INTRAVENOUS | Status: DC | PRN
  Administered 2019-04-28: 500 mL

## 2019-04-28 MED ORDER — SODIUM CHLORIDE 0.9 % IV SOLN: INTRAVENOUS | Status: DC

## 2019-04-28 MED ORDER — COLCHICINE 0.6 MG PO TABS: 0.6000 mg | ORAL_TABLET | Freq: Two times a day (BID) | ORAL | 0 refills | Status: DC

## 2019-04-28 MED ORDER — MIDAZOLAM HCL 2 MG/2ML IJ SOLN
INTRAMUSCULAR | Status: DC | PRN
  Administered 2019-04-28: 1 mg via INTRAVENOUS

## 2019-04-28 MED ORDER — SODIUM CHLORIDE 0.9% FLUSH: 3.0000 mL | INTRAVENOUS | Status: DC | PRN

## 2019-04-28 MED ORDER — HEPARIN (PORCINE) IN NACL 1000-0.9 UT/500ML-% IV SOLN
INTRAVENOUS | Status: AC
  Filled 2019-04-28: qty 1000

## 2019-04-28 MED ORDER — SODIUM CHLORIDE 0.9 % IV SOLN: 250.0000 mL | INTRAVENOUS | Status: DC | PRN

## 2019-04-28 SURGICAL SUPPLY — 3 items
CANNULA 5F STIFF (CANNULA) ×2 IMPLANT
PACK CARDIAC CATH (CUSTOM PROCEDURE TRAY) ×2 IMPLANT
PERIVAC PERICARDIOCENTESIS 8.3 (TRAY / TRAY PROCEDURE) ×2 IMPLANT

## 2019-04-28 NOTE — H&P (View-Only) (Signed)
Cardiology Consult    Patient ID: Carlos Barrera MRN: [id number], DOB/AGE: Apr 02, 1941   Admit date: 04/27/2019 Date of Consult: 04/28/2019  Primary Physician: [facility] Primary Cardiologist: Ida Rogue, MD - new Requesting Provider: S. Kurtis Bushman, MD  Patient Profile    [name] is a 79 y.o. male with a history of PAF, remote tob abuse, COPD, HTN, HL, hypothyroidism, PTSD, agent orange exposure, prostate cancer, and recent dx of lung cancer, who is being seen today for the evaluation of pericardial effusion at the request of Dr. Kurtis Bushman.  Past Medical History   Past Medical History:  Diagnosis Date  . Agent orange exposure   . Asthma    exercise induced asthma   . COPD (chronic obstructive pulmonary disease) ([place])   . DJD (degenerative joint disease)    a. 02/2019 s/p L TKA.  Marland Kitchen GERD (gastroesophageal reflux disease)   . Hearing loss    wears hearing aids  . History of cardiac cath    a. History of 2 cardiac catheterizations, last ~ 10 yrs ago @ [facility]-->reportedly nl.  . History of stress test    a. 2020 - pharmacologic stress testing @ [facility]-->reportedly nl.  . Hyperlipidemia   . Hypertension   . Hypothyroidism   . Insomnia   . Lung cancer ([place])    a. Dx 03/2019.  Marland Kitchen Pericardial effusion    a. 03/2019 Echo: EF 60-65%, no rwma, nl RV fxn. Mildly dil LA. Mod pericardia eff w/o tamponade. Mild Ao sclerosis.  . Prostate cancer ([place])   . PTSD (post-traumatic stress disorder)   . Seasonal allergies   . Thyroid disease     Past Surgical History:  Procedure Laterality Date  . BACK SURGERY     x 2  . COLONOSCOPY     hx polyps  . HERNIA REPAIR    . INCISE AND DRAIN ABCESS     patient denies this procedure 03/03/19  . NECK SURGERY     x 1  . TONSILLECTOMY    . 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 TOTAL KNEE ARTHROPLASTY Left 03/09/2019   Procedure: LEFT TOTAL KNEE ARTHROPLASTY;  Surgeon: Meredith Pel, MD;  Location: [place];  Service:  Orthopedics;  Laterality: Left;  . WISDOM TOOTH EXTRACTION       Allergies  Allergies  Allergen Reactions  . Bee Venom Anaphylaxis  . Feldene [Piroxicam] Hives    History of Present Illness    79 y/o ? 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  He ntoes prior cardiac caths performed in the setting of DOE, last ~ 10 yrs ago, w/ reportedly nl cors.  He is s/p L TKA in 02/2019.  Over the past year, he has noted progressive fxnl decline w/ severe DOE w/ minimal activity.  He underwent stress testing @ some point this year @ the [place], which he has reported as being normal.  Despite adjustment of outpt inhaler therapy, and outpt pulm rehab @ [facility], dyspnea persisted and progressed.  He had a Chest CT @ the [place] in December, which was abnl and led to bronchoscopy w/ pathology + for malignancy.  On 12/27, he was admitted to [facility] in the setting of abrupt onset of dyspnea w/ Afib RVR noted by EMS.  He converted on dilt gtt and was transitioned to oral amio given concern for recurrent afib.  HsTrop was mildly elevated w/ flat trend, and ACS was not suspected.  Echo during admission was notable for nl EF w/ a moderate pericardial effusion w/o tamponade physiology.  At discharge, he was scheduled for f/u in [facility] w/ plan for repeat echo.  Of note, TSH was elevated @ 37.14 and his levothyroxine dose was adjusted.  Unfortunately, he was readmitted to [facility] on 1/2 in the setting of chest pressure after eating/drinking.  HsTrop was again mildly elevated @ 81  113.  This was not felt to represent ACS and symptoms were more compatible w/ GI etiology.  He was dc'd on 1/3 w/ PPI and prn Maalox.  Since then, he has cont to note post-prandial chest fullness, stating that he feels like it would improve if he could just belch.  Chest pressure does not worsen w/ activity.  Over the same period of time, he has  noted worsening DOE.  He feels as though he cannot take a good, deep breath.  Though he initially denied pleuritic c/p, upon my examination, he did c/o chest pain w/ deep breathing.  He was seen @ the [place] on 1/7 for a f/u echo and after the test was completed, he was seen by the on-site cardiologist and advised to present to the ED.  After what sounds like an eval for pulsus paradoxus, he was advised that he didn't have to go the ED but did have to see his cardiologist urgently.  He presented to our office on the afternoon of 1/7 with echo disc in-hand.  This was reviewed by Dr. Saunders Revel, who felt that it did show worsening of the pericardial effusion w/ early tamponade physiology.  In that setting, pt was referred to the ED and subsequently admitted.  Currently his biggest concern is that he will have cancel the PET scan that is scheduled for him @ [facility] today.  He denies any dyspnea or orthopnea, but cont to note a sensation of food/fluid being stuck in his esophagus, and now notes pleuritic c/p w/ deep breathing.  Inpatient Medications    . amiodarone  200 mg Oral Daily  . levothyroxine  88 mcg Oral QAC breakfast  . pantoprazole  40 mg Oral Daily  . polyvinyl alcohol  1 drop Both Eyes BID  . potassium chloride  40 mEq Oral Once  . pravastatin  20 mg Oral q1800  . prazosin  10 mg Oral QHS  . sertraline  50 mg Oral QHS  . sodium chloride flush  3 mL Intravenous Q12H  . tamsulosin  0.4 mg Oral Daily  . traZODone  50 mg Oral QHS    Family History    Family History  Problem Relation Age of Onset  . Breast cancer Mother        w/ brain mets -> died in her 74's.  Marland Kitchen Heart attack Father        died @ 72  . Other Brother        multiple medical issues, pt unaware of specifics.   He indicated that his mother is deceased. He indicated that his father is deceased. He indicated that his brother is alive.   Social History    Social History   Socioeconomic History  . Marital status: Widowed    Spouse  name: Not on file  . Number of children: Not on file  . Years of education: Not on file  . Highest education level: Not on file  Occupational History  . Not on file  Tobacco Use  . Smoking status: Former Smoker    Packs/day: 2.00    Years: 20.00    Pack years: 40.00    Types: Cigarettes    Quit date: 04/20/1980    Years since quitting: 39.0  . Smokeless tobacco: Never Used  Substance and Sexual Activity  . Alcohol use: No  . Drug use: No  . Sexual activity: Yes    Birth control/protection: None  Other Topics Concern  . Not on file  Social History Narrative   Lives locally by himself.  Attends pulm rehab @ [facility] but says activity is very limited due to [place].   Social Determinants of Health   Financial Resource Strain:   . Difficulty of Paying Living Expenses: Not on file  Food Insecurity:   . Worried About Charity fundraiser in the Last Year: Not on file  . Ran Out of Food in the Last Year: Not on file  Transportation Needs:   . Lack of Transportation (Medical): Not on file  . Lack of Transportation (Non-Medical): Not on file  Physical Activity:   . Days of Exercise per Week: Not on file  . Minutes of Exercise per Session: Not on file  Stress:   . Feeling of Stress : Not on file  Social Connections:   . Frequency of Communication with Friends and Family: Not on file  . Frequency of Social Gatherings with Friends and Family: Not on file  . Attends Religious Services: Not on file  . Active Member of Clubs or Organizations: Not on file  . Attends Archivist Meetings: Not on file  . Marital Status: Not on file  Intimate Partner Violence:   . Fear of Current or Ex-Partner: Not on file  . Emotionally Abused: Not on file  . Physically Abused: Not on file  . Sexually Abused: Not on file     Review of Systems    General:  No chills, fever, night sweats or weight changes.  Cardiovascular:  +++ pleuritic chest pain, +++ progressive dyspnea on exertion, no edema,  orthopnea, palpitations, paroxysmal nocturnal dyspnea. Dermatological: No rash, lesions/masses Respiratory: No cough, +++ dyspnea Urologic: No hematuria, dysuria Abdominal:   +++ dysphagia w/ heartburn.  No nausea, vomiting, diarrhea, bright red blood per rectum, melena, or hematemesis Neurologic:  No visual changes, wkns, changes in mental status. All other systems reviewed and are otherwise negative except as noted above.  Physical Exam    Blood pressure 103/66, pulse 81, temperature (!) 97.5 F (36.4 C), temperature source Oral, resp. rate 19, height 5\' 8"  (1.727 m), weight 71.7 kg, SpO2 96 %.  Pulsus paradoxus 84mmHg. General: Pleasant, NAD Psych: Normal affect. Neuro: Alert and oriented X 3. Moves all extremities spontaneously. HEENT: Normal  Neck: Supple without bruits or JVD. Lungs:  Resp regular and unlabored, scattered rhonchi, basilar crackles, diffuse exp wheezing. Heart: RRR no s3, s4, or murmurs. Soft friction rub noted @ apex. Abdomen: Soft, non-tender, non-distended, BS + x 4.  Extremities: No clubbing, cyanosis or edema. DP/PT/Radials 2+ and equal bilaterally.  Labs    Cardiac Enzymes Recent Labs  Lab 04/22/19 1233 04/22/19 1916 04/23/19 1459 04/27/19 1624 04/27/19 1920  TROPONINIHS 81* 113* 90* 121* 139*      Lab Results  Component Value Date   WBC 4.2 04/28/2019   HGB 10.3 (L) 04/28/2019   HCT 32.3 (L) 04/28/2019   MCV 93.6 04/28/2019   PLT 269 04/28/2019    Recent Labs  Lab 04/22/19 1233  04/28/19 0602  NA 138 138  K 4.0 3.4*  CL 103 105  CO2 26 23  BUN 16 15  CREATININE 0.96 0.90  CALCIUM 10.6* 9.7  PROT 7.1  --   BILITOT 0.5  --   ALKPHOS 99  --   ALT 17  --   AST 21  --   GLUCOSE 110* 111*   Lab Results  Component Value Date   DDIMER 7.31 (H) 04/15/2019     Radiology Studies    DG Chest 2 View  Result Date: 04/27/2019 CLINICAL DATA:  Chest pressure shortness of breath for 1 week, recently diagnosed with lung cancer, getting  PET scan tomorrow, former smoker, hypertension EXAM: CHEST - 2 VIEW COMPARISON:  04/17/2019 FINDINGS: Normal heart size, mediastinal contours, and pulmonary vascularity. Small RIGHT pleural effusion and basilar atelectasis. Remaining lungs clear. No LEFT pleural effusion, or evidence of pneumothorax. Bones demineralized with prior cervicothoracic fusion. IMPRESSION: Persistent small RIGHT pleural effusion and basilar atelectasis. Electronically Signed   By: Lavonia Dana M.D.   On: 04/27/2019 17:19   CT abd  Result Date: 04/22/2019 CLINICAL DATA:  Emesis since yesterday. New onset of atrial fibrillation. On blood thinners. Sensation of food stuck in esophagus. Lung and prostate cancer. EXAM: CT ABDOMEN AND PELVIS WITH CONTRAST TECHNIQUE: Multidetector CT imaging of the abdomen and pelvis was performed using the standard protocol following bolus administration of intravenous contrast. CONTRAST:  163mL OMNIPAQUE IOHEXOL 300 MG/ML  SOLN COMPARISON:  Plain film of the abdomen of earlier today. FINDINGS: Lower chest: Mild right base subsegmental atelectasis. Normal heart size with minimal pericardial fluid. Small right pleural effusion. Hepatobiliary: Normal liver. Normal gallbladder, without biliary ductal dilatation. Pancreas: Normal, without mass or ductal dilatation. Spleen: Normal in size, without focal abnormality. Adrenals/Urinary Tract: Normal left adrenal gland. Felt to arise exophytic off the right adrenal is a 2.1 x 1.5 cm nodule including on 29/2. When compared to the CT of the chest of 04/15/2019, this is low-density on that study, favoring an adenoma. Normal kidneys, without hydronephrosis.  Normal urinary bladder. Stomach/Bowel: Normal stomach, without wall thickening. Colonic stool burden suggests constipation. Normal terminal ileum and appendix. Normal small bowel. Vascular/Lymphatic: Aortic and branch vessel atherosclerosis. No abdominopelvic adenopathy. Reproductive: Normal prostate. Other: No  significant free fluid. Small bilateral fat containing inguinal hernias. Musculoskeletal: Lumbar spondylosis. Prior interbody fusion material at L4-5. IMPRESSION: 1. No acute process in the abdomen or pelvis. 2. Possible constipation. 3. Small right pleural effusion. 4. Right adrenal adenoma. 5. Aortic Atherosclerosis (ICD10-I70.0). 6. Small pericardial effusion. Electronically Signed   By: Abigail Miyamoto M.D.   On: 04/22/2019 16:50   DG Abd 2 Views  Result Date: 04/22/2019 CLINICAL DATA:  Abdominal pain EXAM: ABDOMEN - 2 VIEW COMPARISON:  None. FINDINGS: The bowel gas pattern is normal. Moderate volume of stool throughout the colon. There is no evidence of free air. No radio-opaque calculi or other significant radiographic abnormality is seen. IMPRESSION: Nonobstructive bowel gas pattern with moderate volume of stool throughout the colon. Electronically Signed   By: Davina Poke D.O.   On: 04/22/2019 13:30   ECG & Cardiac Imaging    RSR, 77, nonspecific ST/T changes w/ baseline artifact - personally reviewed.  Assessment & Plan    1.  Pericardial Effusion:  Recent dx of lung cancer in the setting of progressive DOE over the past year.  Moderate effusion initially noted on echo @ [facility] in late Dec.  He was hemodynamically stable @ the time and  there was no evidence of tamponade physiology.  Over the past 2 wks, he has noted worsening of DOE and also difficulty taking deep breaths, now associated w/ pleuritic c/p.  Repeat echo @ [place] yesterday with evidence for early tamponade resulting in cards visit and admission.  ECG w/o evidence of pericarditis.  Pulsus Paradoxus of 15 mmHg.  Dr. Fletcher Anon to review echo this AM and consider pericardiocentesis.  Pt NPO and willing to proceed if necessary.  Last Eliquis dose was likely several days ago, as he says he is sure he didn't take it yesterday and may missed Wed 1/6 as well. Will add colchicine 0.6mg  BID empirically.   2.  PAF:  Maintaining sinus on oral amio.   Eliquis on hold in setting of potential need for pericardiocentesis - last dose likely several days ago as outlined above.  3.  Elevated troponin:  He has had mild HsTrop elevation dating back to hosp in late Dec.  On this admission - 121  139.  In setting of #1, he has had mild persistent chest pressure worsened by eating/drinking, and also pleuritic c/p and DOE.  Prior reportedly nl stress test earlier this year and reportedly nl remote cath.  I do not think that mild HsTrop elevation reflects ACS.  No plan for ischemic eval @ this time.  4.  Lung Cancer:  Was scheduled for PET scan today @ [facility].  He has cancelled.  I discussed possibly having it done here today w/ nuc med, however they do not do inpt PET scans and he has contacted cancer center to reschedule.  5.  Dysphagia w/ GERD:  Cont PPI.  Symptoms of difficulty swallowing and persistent globus sensation w/ chest pressure have been present since hospitalization @ [facility].  ? If he'd benefit from reglan - defer to IM.  Likely needs swallow eval as outpt.  Signed, Murray Hodgkins, NP 04/28/2019, 8:10 AM  For questions or updates, please contact   Please consult [email or web address] for contact info under Cardiology/STEMI.

## 2019-04-28 NOTE — Progress Notes (Signed)
Writer (primary RN) attempted to complete head to toe assessment and medication administration with patient at approximately 2122. Patient adamantly refused and reported that I could complete those tasks "when he was ready for me to."  Will return to complete medication administration and head to toe assessment when requested by patient.   Will monitor patient as closely as he allows.

## 2019-04-28 NOTE — Discharge Summary (Signed)
Carlos Barrera:[id number] DOB: 1941-01-27 DOA: 04/27/2019  PCP: [facility]  Admit date: 04/27/2019 Discharge date: 04/28/2019  Admitted From: home Disposition:  home  Recommendations for Outpatient Follow-up:  1. Follow up with PCP in 1 week 2. Please obtain BMP/CBC in one week 3. Please follow up on the following pending results:none 4. F/u with cardiology next week for repeat echo  Home Health:no    Discharge Condition:Stable CODE STATUS: Full Diet recommendation: Heart Healthy / Carb Modified / Regular / Dysphagia  Brief/Interim Summary: Carlos Barrera is a 79 y.o. male with medical history significant for paroxysmal atrial fibrillation on Eliquis and amiodarone, adenocarcinoma of the right lung, COPD, hypertension, hyperlipidemia, remote prostate cancer, agent orange exposure, and hypothyroidism who presents to the ED from the cardiology clinic for worsening pericardial effusion.off note patient recently was hospitalized 04/15/2019-04/21/2019 at [facility] for atrial fibrillation with RVR.  He has been started on Eliquis and amiodarone.  Echocardiograms admission showed a moderate pericardial effusion.  He was readmitted on 04/22/2019-04/23/2019 for chest discomfort.  He had mildly elevated troponin which was felt related to demand ischemia.  He was cleared for discharge by cardiology.  When he found a recurrence UVA he had an abnormal echocardiogram and was sent to cardiology clinic for further evaluation after reviewing the echo and revealing worsening pericardial effusion patient was sent to ED for admission for possible drainage. He was taken to Cath Lab today unfortunately they were unable to drain the pericardial effusion but not surgical intervention needed at this time.  Cardiology was okay with patient being discharged home with follow-up with them as outpatient for close monitoring and repeat echocardiogram.  Since patient is hemodynamically stable he is going  to be discharged home.  He was started on colchicine too.  Cardiology was also okay for him to resume Eliquis when I spoke to Dr. Rockey Situ about this.  Discharge Diagnoses:  Principal Problem:   Pericardial effusion Active Problems:   Adenocarcinoma of right lung (HCC)   Paroxysmal atrial fibrillation ([place])   Hypothyroidism   Hyperlipidemia    Discharge Instructions  Discharge Instructions    Call MD for:  difficulty breathing, headache or visual disturbances   Complete by: As directed    Diet - low sodium heart healthy   Complete by: As directed    Discharge instructions   Complete by: As directed    F/u with cardiology next week for repeat echo   Increase activity slowly   Complete by: As directed      Allergies as of 04/28/2019      Reactions   Bee Venom Anaphylaxis   Feldene [piroxicam] Hives      Medication List    STOP taking these medications   guaifenesin 400 MG Tabs tablet Commonly known as: HUMIBID E     TAKE these medications   alum & mag hydroxide-simeth 200-200-20 MG/5ML suspension Commonly known as: MAALOX/MYLANTA Take 30 mLs by mouth every 6 (six) hours as needed for indigestion or heartburn.   amiodarone 100 MG tablet Commonly known as: Pacerone Take 2 tablets (200 mg total) by mouth 2 (two) times daily for 7 days, THEN 2 tablets (200 mg total) daily. Start taking on: April 21, 2019   apixaban 5 MG Tabs tablet Commonly known as: ELIQUIS Take 1 tablet (5 mg total) by mouth 2 (two) times daily.   Calcium Carbonate-Vitamin D 600-400 MG-UNIT tablet Take 1 tablet by mouth 2 (two) times daily.   cetirizine 10  MG tablet Commonly known as: ZYRTEC Take 10 mg by mouth daily.   colchicine 0.6 MG tablet Take 1 tablet (0.6 mg total) by mouth 2 (two) times daily.   folic acid 1 MG tablet Commonly known as: FOLVITE Take 1 mg by mouth daily.   levothyroxine 88 MCG tablet Commonly known as: SYNTHROID Take 88 mcg by mouth daily before breakfast.    omeprazole 20 MG capsule Commonly known as: PRILOSEC Take 20 mg by mouth 2 (two) times daily as needed (acid reflux/indigestion.).   ondansetron 4 MG disintegrating tablet Commonly known as: Zofran ODT Take 1 tablet (4 mg total) by mouth every 8 (eight) hours as needed for nausea or vomiting.   polyethylene glycol 17 g packet Commonly known as: MIRALAX / GLYCOLAX Take 17 g by mouth daily.   pravastatin 20 MG tablet Commonly known as: PRAVACHOL Take 20 mg by mouth at bedtime.   prazosin 5 MG capsule Commonly known as: MINIPRESS Take 10 mg by mouth at bedtime.   Refresh Tears 0.5 % Soln Generic drug: carboxymethylcellulose Place 1 drop into both eyes 2 (two) times daily.   sertraline 50 MG tablet Commonly known as: ZOLOFT Take 50 mg by mouth at bedtime.   tamsulosin 0.4 MG Caps capsule Commonly known as: FLOMAX Take 0.4 mg by mouth daily.   traZODone 50 MG tablet Commonly known as: DESYREL Take 50 mg by mouth at bedtime.      Follow-up Information    Wellington Hampshire, MD Follow up.   Specialty: Cardiology Why: We will arrange for a follow-up heart ultrasound in ~ 7-10 days and follow-up with Dr. Fletcher Anon shortly thereafter. Contact information: [address] [place] [address] [phone]          Allergies  Allergen Reactions  . Bee Venom Anaphylaxis  . Feldene [Piroxicam] Hives    Consultations: Cardiology  Procedures/Studies: DG Chest 2 View  Result Date: 04/27/2019 CLINICAL DATA:  Chest pressure shortness of breath for 1 week, recently diagnosed with lung cancer, getting PET scan tomorrow, former smoker, hypertension EXAM: CHEST - 2 VIEW COMPARISON:  04/17/2019 FINDINGS: Normal heart size, mediastinal contours, and pulmonary vascularity. Small RIGHT pleural effusion and basilar atelectasis. Remaining lungs clear. No LEFT pleural effusion, or evidence of pneumothorax. Bones demineralized with prior cervicothoracic fusion. IMPRESSION:  Persistent small RIGHT pleural effusion and basilar atelectasis. Electronically Signed   By: Lavonia Dana M.D.   On: 04/27/2019 17:19   CT Angio Chest PE W and/or Wo Contrast  Result Date: 04/16/2019 CLINICAL DATA:  Shortness of breath EXAM: CT ANGIOGRAPHY CHEST WITH CONTRAST TECHNIQUE: Multidetector CT imaging of the chest was performed using the standard protocol during bolus administration of intravenous contrast. Multiplanar CT image reconstructions and MIPs were obtained to evaluate the vascular anatomy. CONTRAST:  184mL OMNIPAQUE IOHEXOL 350 MG/ML SOLN COMPARISON:  None. FINDINGS: Cardiovascular: There is a optimal opacification of the pulmonary arteries. There is no central,segmental, or subsegmental filling defects within the pulmonary arteries. There is mild cardiomegaly. A small to moderate pericardial effusion is present. Mild scattered aortic atherosclerosis is seen. There is normal three-vessel brachiocephalic anatomy. Mediastinum/Nodes: Subcarinal adenopathy is seen measuring 2.2 cm in AP dimension. There is also a right hilar adenopathy which measures 1.5 cm in AP dimension. Thyroid gland, trachea, and esophagus demonstrate no significant findings. Lungs/Pleura: There is small peripheral patchy airspace opacity seen within the posterior right upper and lower lung. There is also tree-in-bud opacity seen in the periphery of the right  lung base. There is a small right and trace left pleural effusion. Mildly increased interstitial thickening seen within both upper lungs. Upper Abdomen: No acute abnormalities present in the visualized portions of the upper abdomen. Musculoskeletal: No chest wall abnormality. No acute or significant osseous findings. Review of the MIP images confirms the above findings. IMPRESSION: No central, segmental, or subsegmental pulmonary embolism. Small to moderate pericardial effusion Subcarinal and right hilar adenopathy which could be reactive. Patchy/ground-glass opacity in  the posterior right lung which could be due to atelectasis and/or infectious etiology. Small right and trace left pleural effusion. Mildly increased interstitial thickening seen within both upper lungs likely due to mild interstitial edema. Aortic Atherosclerosis (ICD10-I70.0). Electronically Signed   By: Prudencio Pair M.D.   On: 04/16/2019 00:04   CT abd  Result Date: 04/22/2019 CLINICAL DATA:  Emesis since yesterday. New onset of atrial fibrillation. On blood thinners. Sensation of food stuck in esophagus. Lung and prostate cancer. EXAM: CT ABDOMEN AND PELVIS WITH CONTRAST TECHNIQUE: Multidetector CT imaging of the abdomen and pelvis was performed using the standard protocol following bolus administration of intravenous contrast. CONTRAST:  150mL OMNIPAQUE IOHEXOL 300 MG/ML  SOLN COMPARISON:  Plain film of the abdomen of earlier today. FINDINGS: Lower chest: Mild right base subsegmental atelectasis. Normal heart size with minimal pericardial fluid. Small right pleural effusion. Hepatobiliary: Normal liver. Normal gallbladder, without biliary ductal dilatation. Pancreas: Normal, without mass or ductal dilatation. Spleen: Normal in size, without focal abnormality. Adrenals/Urinary Tract: Normal left adrenal gland. Felt to arise exophytic off the right adrenal is a 2.1 x 1.5 cm nodule including on 29/2. When compared to the CT of the chest of 04/15/2019, this is low-density on that study, favoring an adenoma. Normal kidneys, without hydronephrosis.  Normal urinary bladder. Stomach/Bowel: Normal stomach, without wall thickening. Colonic stool burden suggests constipation. Normal terminal ileum and appendix. Normal small bowel. Vascular/Lymphatic: Aortic and branch vessel atherosclerosis. No abdominopelvic adenopathy. Reproductive: Normal prostate. Other: No significant free fluid. Small bilateral fat containing inguinal hernias. Musculoskeletal: Lumbar spondylosis. Prior interbody fusion material at L4-5.  IMPRESSION: 1. No acute process in the abdomen or pelvis. 2. Possible constipation. 3. Small right pleural effusion. 4. Right adrenal adenoma. 5. Aortic Atherosclerosis (ICD10-I70.0). 6. Small pericardial effusion. Electronically Signed   By: Abigail Miyamoto M.D.   On: 04/22/2019 16:50   CARDIAC CATHETERIZATION  Result Date: 04/28/2019 And successful pericardiocentesis due to inability to reach the fluid from the subxiphoid area.  The amount of effusion on echo did not appear large enough to be tapped from this approach. Recommendations: We will obtain a repeat limited echocardiogram.  If there is significant posterior and apical fluid, a pericardial window might be the best option.  DG CHEST PORT 1 VIEW  Result Date: 04/17/2019 CLINICAL DATA:  Atrial fibrillation and shortness of breath. Evaluate for pulmonary infiltrates. EXAM: PORTABLE CHEST 1 VIEW COMPARISON:  04/16/2019 FINDINGS: Heart size appears within normal limits. New small right pleural effusion identified. Mild asymmetric edema identified within the right mid and right lower lung. Left lung is clear. IMPRESSION: 1. Small right effusion with mild asymmetric edema in the right mid and right lower lung. Electronically Signed   By: Kerby Moors M.D.   On: 04/17/2019 09:27   DG Chest Portable 1 View  Result Date: 04/16/2019 CLINICAL DATA:  Shortness of breath EXAM: PORTABLE CHEST 1 VIEW COMPARISON:  None. FINDINGS: The heart size and mediastinal contours are within normal limits. There is mildly increased interstitial markings seen  throughout both lungs. There is patchy airspace opacity at the periphery of the right lung base. The visualized skeletal structures are unremarkable. IMPRESSION: Mildly increased interstitial markings throughout both lungs with patchy airspace opacity in the right lung base. This could be due to asymmetric edema and/or infectious etiology. Electronically Signed   By: Prudencio Pair M.D.   On: 04/16/2019 00:39   DG Abd  2 Views  Result Date: 04/22/2019 CLINICAL DATA:  Abdominal pain EXAM: ABDOMEN - 2 VIEW COMPARISON:  None. FINDINGS: The bowel gas pattern is normal. Moderate volume of stool throughout the colon. There is no evidence of free air. No radio-opaque calculi or other significant radiographic abnormality is seen. IMPRESSION: Nonobstructive bowel gas pattern with moderate volume of stool throughout the colon. Electronically Signed   By: Davina Poke D.O.   On: 04/22/2019 13:30   ECHOCARDIOGRAM COMPLETE  Result Date: 04/16/2019   ECHOCARDIOGRAM REPORT   Patient Name:   JUJHAR EVERETT Date of Exam: 04/16/2019 Medical Rec #:  [id number]       Height:       68.0 in Accession #:    [id number]      Weight:       154.8 lb Date of Birth:  03-Feb-1941       BSA:          1.83 m Patient Age:    79 years        BP:           105/54 mmHg Patient Gender: M               HR:           74 bpm. Exam Location:  Inpatient Procedure: 2D Echo, Color Doppler and Cardiac Doppler Indications:    Atrial fibrillation  History:        Patient has no prior history of Echocardiogram examinations.                 Lung and prostate cancer.  Sonographer:    Merrie Roof RDCS Referring Phys: [place]  1. Left ventricular ejection fraction, by visual estimation, is 60 to 65%. The left ventricle has normal function. There is no left ventricular hypertrophy.  2. The left ventricle has no regional wall motion abnormalities.  3. Global right ventricle has normal systolic function.The right ventricular size is normal. No increase in right ventricular wall thickness.  4. Left atrial size was mildly dilated.  5. Right atrial size was normal.  6. Moderate pericardial effusion.  7. The pericardial effusion is anterior to the right ventricle.  8. Mild mitral annular calcification.  9. The mitral valve is normal in structure. No evidence of mitral valve regurgitation. No evidence of mitral stenosis. 10. The tricuspid valve is  normal in structure. 11. The aortic valve is normal in structure. Aortic valve regurgitation is not visualized. Mild aortic valve sclerosis without stenosis. 12. The pulmonic valve was normal in structure. Pulmonic valve regurgitation is not visualized. 13. The inferior vena cava is normal in size with greater than 50% respiratory variability, suggesting right atrial pressure of 3 mmHg. 14. No prior Echocardiogram. FINDINGS  Left Ventricle: Left ventricular ejection fraction, by visual estimation, is 60 to 65%. The left ventricle has normal function. The left ventricle has no regional wall motion abnormalities. There is no left ventricular hypertrophy. Left ventricular diastolic parameters were normal. Normal left atrial pressure. Right Ventricle: The right ventricular size is normal. No increase in  right ventricular wall thickness. Global RV systolic function is has normal systolic function. Left Atrium: Left atrial size was mildly dilated. Right Atrium: Right atrial size was normal in size Pericardium: A moderately sized pericardial effusion is present. The pericardial effusion is anterior to the right ventricle. There is no evidence of cardiac tamponade. Mitral Valve: The mitral valve is normal in structure. Mild mitral annular calcification. No evidence of mitral valve regurgitation. No evidence of mitral valve stenosis by observation. Tricuspid Valve: The tricuspid valve is normal in structure. Tricuspid valve regurgitation is not demonstrated. Aortic Valve: The aortic valve is normal in structure. Aortic valve regurgitation is not visualized. Mild aortic valve sclerosis is present, with no evidence of aortic valve stenosis. Pulmonic Valve: The pulmonic valve was normal in structure. Pulmonic valve regurgitation is not visualized. Pulmonic regurgitation is not visualized. Aorta: The aortic root, ascending aorta and aortic arch are all structurally normal, with no evidence of dilitation or obstruction. Venous:  The inferior vena cava was not well visualized. The inferior vena cava is normal in size with greater than 50% respiratory variability, suggesting right atrial pressure of 3 mmHg. IAS/Shunts: No atrial level shunt detected by color flow Doppler. There is no evidence of a patent foramen ovale. No ventricular septal defect is seen or detected. There is no evidence of an atrial septal defect.  LEFT VENTRICLE PLAX 2D LVIDd:         4.35 cm       Diastology LVIDs:         2.82 cm       LV e' lateral:   7.83 cm/s LV PW:         1.13 cm       LV E/e' lateral: 8.7 LV IVS:        1.04 cm       LV e' medial:    8.38 cm/s LVOT diam:     1.90 cm       LV E/e' medial:  8.1 LV SV:         55 ml LV SV Index:   30.05 LVOT Area:     2.84 cm  LV Volumes (MOD) LV area d, A4C:    24.20 cm LV area s, A4C:    12.20 cm LV major d, A4C:   7.25 cm LV major s, A4C:   5.94 cm LV vol d, MOD A4C: 66.1 ml LV vol s, MOD A4C: 20.9 ml LV SV MOD A4C:     66.1 ml RIGHT VENTRICLE RV Basal diam:  3.54 cm LEFT ATRIUM             Index       RIGHT ATRIUM           Index LA diam:        4.20 cm 2.29 cm/m  RA Area:     12.80 cm LA Vol (A2C):   42.1 ml 22.97 ml/m RA Volume:   27.60 ml  15.06 ml/m LA Vol (A4C):   51.6 ml 28.15 ml/m LA Biplane Vol: 50.0 ml 27.28 ml/m  AORTIC VALVE LVOT Vmax:   71.10 cm/s LVOT Vmean:  47.800 cm/s LVOT VTI:    0.138 m  AORTA Ao Root diam: 3.60 cm Ao Asc diam:  3.60 cm MITRAL VALVE MV Area (PHT): 4.29 cm             SHUNTS MV PHT:        51.33 msec  Systemic VTI:  0.14 m MV Decel Time: 177 msec             Systemic Diam: 1.90 cm MV E velocity: 68.10 cm/s 103 cm/s MV A velocity: 56.10 cm/s 70.3 cm/s MV E/A ratio:  1.21       1.5  Mihai Croitoru MD Electronically signed by Sanda Klein MD Signature Date/Time: 04/16/2019/5:15:08 PM    Final    ECHOCARDIOGRAM LIMITED  Result Date: 04/28/2019   ECHOCARDIOGRAM LIMITED REPORT   Patient Name:   JOHNNEY SCARLATA Date of Exam: 04/28/2019 Medical Rec #:  [id number]        Height:       68.0 in Accession #:    [id number]      Weight:       158.0 lb Date of Birth:  [birth year]       BSA:          1.85 m Patient Age:    50 years        BP:           106/84 mmHg Patient Gender: M               HR:           76 bpm. Exam Location:  [facility]  Procedure: Limited Echo, Color Doppler and Cardiac Doppler Indications:     Pericardial effusion 423.9  History:         Patient has prior history of Echocardiogram examinations, most                  recent 04/16/2019. Pericardial Effusion.  Sonographer:     Sherrie Sport RDCS (AE) Referring Phys:  [place] Diagnosing Phys: Ida Rogue MD IMPRESSIONS  1. Left ventricular ejection fraction, by visual estimation, is 60 to 65%. The left ventricle has normal function. There is no increased left ventricular wall thickness.  2. Global right ventricle has normal systolc function.The right ventricular size is normal. no increase in right ventricular wall thickness.  3. Moderate pericardial effusion, predominantly around the RV free wall, estimated 1.4 to 1.8 cm, Trace outside the LV free wall, no evidence of tamponade.  4. Tricuspid valve regurgitation is mild.  5. Normal pulmonary artery systolic pressure. FINDINGS  Left Ventricle: Left ventricular ejection fraction, by visual estimation, is 60 to 65%. The left ventricle has normal function. There is no increased left ventricular wall thickness. Normal left atrial pressure. Right Ventricle: The right ventricular size is normal. No increase in right ventricular wall thickness. Global RV systolic function is has normal systolic function. Left Atrium: Left atrial size was normal in size. Right Atrium: Right atrial size was normal in size. Right atrial pressure is estimated at 10 mmHg. Pericardium: A moderately sized pericardial effusion is present is seen. A moderately sized pericardial effusion is present. Mitral Valve: The mitral valve is normal in structure. No evidence of mitral valve stenosis by  observation. No evidence of mitral valve regurgitation. Tricuspid Valve: The tricuspid valve is normal in structure. Tricuspid valve regurgitation is mild. Aortic Valve: The aortic valve is normal in structure. Aortic valve regurgitation is not visualized. The aortic valve is structurally normal, with no evidence of sclerosis or stenosis. Pulmonic Valve: The pulmonic valve was normal in structure. Pulmonic valve regurgitation is not visualized by color flow Doppler. Pulmonic regurgitation is not visualized by color flow Doppler. Aorta: The aortic root, ascending aorta and aortic arch are all structurally normal, with no evidence of  dilitation or obstruction. Venous: The inferior vena cava is normal in size with greater than 50% respiratory variability, suggesting right atrial pressure of 3 mmHg. Shunts: There is no evidence of a patent foramen ovale. No ventricular septal defect is seen or detected. There is no evidence of an atrial septal defect. No atrial level shunt detected by color flow Doppler.  LEFT VENTRICLE          Normals PLAX 2D LVIDd:         3.68 cm  3.6 cm LVIDs:         2.39 cm  1.7 cm LV PW:         1.34 cm  1.4 cm LV IVS:        0.77 cm  1.3 cm LVOT diam:     2.00 cm  2.0 cm LV SV:         37 ml    79 ml LV SV Index:   20.12    45 ml/m2 LVOT Area:     3.14 cm 3.14 cm2  RIGHT VENTRICLE RV Basal diam:  4.02 cm LEFT ATRIUM         Index LA diam:    3.40 cm 1.84 cm/m   AORTA                 Normals Ao Root diam: 3.30 cm 31 mm  SHUNTS Systemic Diam: 2.00 cm  Ida Rogue MD Electronically signed by Ida Rogue MD Signature Date/Time: 04/28/2019/3:20:35 PMThe mitral valve is normal in structure.    Final        Subjective: Has mild shortness of breath no chest pain.  No other complaints Discharge Exam: Vitals:   04/28/19 1445 04/28/19 1505  BP: 106/84 104/64  Pulse: 76 78  Resp: 18 20  Temp:  97.6 F (36.4 C)  SpO2: 96% 97%   Vitals:   04/28/19 1415 04/28/19 1430 04/28/19 1445  04/28/19 1505  BP: 116/76 136/69 106/84 104/64  Pulse: 77 76 76 78  Resp: 18 15 18 20   Temp:    97.6 F (36.4 C)  TempSrc:    Oral  SpO2: 93% 94% 96% 97%  Weight:      Height:        General: Pt is alert, awake, not in acute distress Cardiovascular: RRR, S1/S2 +, no rubs, no gallops Respiratory: CTA bilaterally, no wheezing, no rhonchi Abdominal: Soft, NT, ND, bowel sounds + Extremities: no edema, no cyanosis Neuro :alert oriented x3 cranial nerves grossly intact    The results of significant diagnostics from this hospitalization (including imaging, microbiology, ancillary and laboratory) are listed below for reference.     Microbiology: Recent Results (from the past 240 hour(s))  SARS CORONAVIRUS 2 (TAT 6-24 HRS) Nasopharyngeal Nasopharyngeal Swab     Status: None   Collection Time: 04/22/19  7:16 PM   Specimen: Nasopharyngeal Swab  Result Value Ref Range Status   SARS Coronavirus 2 NEGATIVE NEGATIVE Final    Comment: (NOTE) SARS-CoV-2 target nucleic acids are NOT DETECTED. The SARS-CoV-2 RNA is generally detectable in upper and lower respiratory specimens during the acute phase of infection. Negative results do not preclude SARS-CoV-2 infection, do not rule out co-infections with other pathogens, and should not be used as the sole basis for treatment or other patient management decisions. Negative results must be combined with clinical observations, patient history, and epidemiological information. The expected result is Negative. Fact Sheet for Patients: SugarRoll.be Fact Sheet for Healthcare Providers: https://www.woods-mathews.com/ This  test is not yet approved or cleared by the [place] and  has been authorized for detection and/or diagnosis of SARS-CoV-2 by FDA under an Emergency Use Authorization (EUA). This EUA will remain  in effect (meaning this test can be used) for the duration of the COVID-19 declaration  under Section 56 4(b)(1) of the Act, 21 U.S.C. section 360bbb-3(b)(1), unless the authorization is terminated or revoked sooner. Performed at [facility], [place] [address], [place], [place] [address]   Respiratory Panel by RT PCR (Flu A&B, Covid) - Nasopharyngeal Swab     Status: None   Collection Time: 04/27/19  7:20 PM   Specimen: Nasopharyngeal Swab  Result Value Ref Range Status   SARS Coronavirus 2 by RT PCR NEGATIVE NEGATIVE Final    Comment: (NOTE) SARS-CoV-2 target nucleic acids are NOT DETECTED. The SARS-CoV-2 RNA is generally detectable in upper respiratoy specimens during the acute phase of infection. The lowest concentration of SARS-CoV-2 viral copies this assay can detect is 131 copies/mL. A negative result does not preclude SARS-Cov-2 infection and should not be used as the sole basis for treatment or other patient management decisions. A negative result may occur with  improper specimen collection/handling, submission of specimen other than nasopharyngeal swab, presence of viral mutation(s) within the areas targeted by this assay, and inadequate number of viral copies (<131 copies/mL). A negative result must be combined with clinical observations, patient history, and epidemiological information. The expected result is Negative. Fact Sheet for Patients:  PinkCheek.be Fact Sheet for Healthcare Providers:  GravelBags.it This test is not yet ap proved or cleared by the [place] FDA and  has been authorized for detection and/or diagnosis of SARS-CoV-2 by FDA under an Emergency Use Authorization (EUA). This EUA will remain  in effect (meaning this test can be used) for the duration of the COVID-19 declaration under Section 564(b)(1) of the Act, 21 U.S.C. section 360bbb-3(b)(1), unless the authorization is terminated or revoked sooner.    Influenza A by PCR NEGATIVE NEGATIVE Final   Influenza B by PCR  NEGATIVE NEGATIVE Final    Comment: (NOTE) The Xpert Xpress SARS-CoV-2/FLU/RSV assay is intended as an aid in  the diagnosis of influenza from Nasopharyngeal swab specimens and  should not be used as a sole basis for treatment. Nasal washings and  aspirates are unacceptable for Xpert Xpress SARS-CoV-2/FLU/RSV  testing. Fact Sheet for Patients: PinkCheek.be Fact Sheet for Healthcare Providers: GravelBags.it This test is not yet approved or cleared by the [place] FDA and  has been authorized for detection and/or diagnosis of SARS-CoV-2 by  FDA under an Emergency Use Authorization (EUA). This EUA will remain  in effect (meaning this test can be used) for the duration of the  Covid-19 declaration under Section 564(b)(1) of the Act, 21  U.S.C. section 360bbb-3(b)(1), unless the authorization is  terminated or revoked. Performed at [facility], [address] [place], [place] [address]      Labs: BNP (last 3 results) No results for input(s): BNP in the last 8760 hours. Basic Metabolic Panel: Recent Labs  Lab 04/22/19 1233 04/27/19 1624 04/28/19 0602  NA 138 140 138  K 4.0 4.3 3.4*  CL 103 104 105  CO2 26 30 23   GLUCOSE 110* 111* 111*  BUN 16 12 15   CREATININE 0.96 0.96 0.90  CALCIUM 10.6* 10.3 9.7   Liver Function Tests: Recent Labs  Lab 04/22/19 1233  AST 21  ALT 17  ALKPHOS 99  BILITOT 0.5  PROT 7.1  ALBUMIN 3.4*   Recent Labs  Lab 04/22/19 1233  LIPASE 38   No results for input(s): AMMONIA in the last 168 hours. CBC: Recent Labs  Lab 04/22/19 1233 04/27/19 1624 04/28/19 0602  WBC 6.0 7.0 4.2  HGB 12.0* 11.6* 10.3*  HCT 35.4* 36.5* 32.3*  MCV 89.4 95.3 93.6  PLT 350 385 269   Cardiac Enzymes: No results for input(s): CKTOTAL, CKMB, CKMBINDEX, TROPONINI in the last 168 hours. BNP: Invalid input(s): POCBNP CBG: No results for input(s): GLUCAP in the last 168  hours. D-Dimer No results for input(s): DDIMER in the last 72 hours. Hgb A1c No results for input(s): HGBA1C in the last 72 hours. Lipid Profile No results for input(s): CHOL, HDL, LDLCALC, TRIG, CHOLHDL, LDLDIRECT in the last 72 hours. Thyroid function studies No results for input(s): TSH, T4TOTAL, T3FREE, THYROIDAB in the last 72 hours.  Invalid input(s): FREET3 Anemia work up No results for input(s): VITAMINB12, FOLATE, FERRITIN, TIBC, IRON, RETICCTPCT in the last 72 hours. Urinalysis    Component Value Date/Time   COLORURINE YELLOW (A) 04/22/2019 1916   APPEARANCEUR CLEAR (A) 04/22/2019 1916   LABSPEC >1.046 (H) 04/22/2019 1916   PHURINE 5.0 04/22/2019 1916   GLUCOSEU NEGATIVE 04/22/2019 1916   HGBUR NEGATIVE 04/22/2019 [place] NEGATIVE 04/22/2019 1916   KETONESUR NEGATIVE 04/22/2019 1916   PROTEINUR 30 (A) 04/22/2019 1916   UROBILINOGEN 1.0 05/22/2009 1525   NITRITE NEGATIVE 04/22/2019 1916   LEUKOCYTESUR NEGATIVE 04/22/2019 1916   Sepsis Labs Invalid input(s): PROCALCITONIN,  WBC,  LACTICIDVEN Microbiology Recent Results (from the past 240 hour(s))  SARS CORONAVIRUS 2 (TAT 6-24 HRS) Nasopharyngeal Nasopharyngeal Swab     Status: None   Collection Time: 04/22/19  7:16 PM   Specimen: Nasopharyngeal Swab  Result Value Ref Range Status   SARS Coronavirus 2 NEGATIVE NEGATIVE Final    Comment: (NOTE) SARS-CoV-2 target nucleic acids are NOT DETECTED. The SARS-CoV-2 RNA is generally detectable in upper and lower respiratory specimens during the acute phase of infection. Negative results do not preclude SARS-CoV-2 infection, do not rule out co-infections with other pathogens, and should not be used as the sole basis for treatment or other patient management decisions. Negative results must be combined with clinical observations, patient history, and epidemiological information. The expected result is Negative. Fact Sheet for  Patients: SugarRoll.be Fact Sheet for Healthcare Providers: https://www.woods-mathews.com/ This test is not yet approved or cleared by the [place] FDA and  has been authorized for detection and/or diagnosis of SARS-CoV-2 by FDA under an Emergency Use Authorization (EUA). This EUA will remain  in effect (meaning this test can be used) for the duration of the COVID-19 declaration under Section 56 4(b)(1) of the Act, 21 U.S.C. section 360bbb-3(b)(1), unless the authorization is terminated or revoked sooner. Performed at [facility], [place] [address] [place], [place] [address]   Respiratory Panel by RT PCR (Flu A&B, Covid) - Nasopharyngeal Swab     Status: None   Collection Time: 04/27/19  7:20 PM   Specimen: Nasopharyngeal Swab  Result Value Ref Range Status   SARS Coronavirus 2 by RT PCR NEGATIVE NEGATIVE Final    Comment: (NOTE) SARS-CoV-2 target nucleic acids are NOT DETECTED. The SARS-CoV-2 RNA is generally detectable in upper respiratoy specimens during the acute phase of infection. The lowest concentration of SARS-CoV-2 viral copies this assay can detect is 131 copies/mL. A negative result does not preclude SARS-Cov-2 infection and should not be used as  the sole basis for treatment or other patient management decisions. A negative result may occur with  improper specimen collection/handling, submission of specimen other than nasopharyngeal swab, presence of viral mutation(s) within the areas targeted by this assay, and inadequate number of viral copies (<131 copies/mL). A negative result must be combined with clinical observations, patient history, and epidemiological information. The expected result is Negative. Fact Sheet for Patients:  PinkCheek.be Fact Sheet for Healthcare Providers:  GravelBags.it This test is not yet ap proved or cleared by the [place] FDA  and  has been authorized for detection and/or diagnosis of SARS-CoV-2 by FDA under an Emergency Use Authorization (EUA). This EUA will remain  in effect (meaning this test can be used) for the duration of the COVID-19 declaration under Section 564(b)(1) of the Act, 21 U.S.C. section 360bbb-3(b)(1), unless the authorization is terminated or revoked sooner.    Influenza A by PCR NEGATIVE NEGATIVE Final   Influenza B by PCR NEGATIVE NEGATIVE Final    Comment: (NOTE) The Xpert Xpress SARS-CoV-2/FLU/RSV assay is intended as an aid in  the diagnosis of influenza from Nasopharyngeal swab specimens and  should not be used as a sole basis for treatment. Nasal washings and  aspirates are unacceptable for Xpert Xpress SARS-CoV-2/FLU/RSV  testing. Fact Sheet for Patients: PinkCheek.be Fact Sheet for Healthcare Providers: GravelBags.it This test is not yet approved or cleared by the [place] FDA and  has been authorized for detection and/or diagnosis of SARS-CoV-2 by  FDA under an Emergency Use Authorization (EUA). This EUA will remain  in effect (meaning this test can be used) for the duration of the  Covid-19 declaration under Section 564(b)(1) of the Act, 21  U.S.C. section 360bbb-3(b)(1), unless the authorization is  terminated or revoked. Performed at [facility], [address] [place], [place] [address]      Time coordinating discharge: Over 30 minutes  SIGNED:   Nolberto Hanlon, MD  [facility] 04/28/2019, 3:58 PM Pager   If 7PM-7AM, please contact night-coverage [email or web address] Password [email or web address]

## 2019-04-28 NOTE — ED Notes (Signed)
Patient given meds and warm blanket. Denies any other needs at this time

## 2019-04-28 NOTE — Interval H&P Note (Signed)
History and Physical Interval Note:  04/28/2019 1:04 PM  Carlos Barrera  has presented today for surgery, with the diagnosis of pericardial effusion.  The various methods of treatment have been discussed with the patient and family. After consideration of risks, benefits and other options for treatment, the patient has consented to  Procedure(s): PERICARDIOCENTESIS (N/A) as a surgical intervention.  The patient's history has been reviewed, patient examined, no change in status, stable for surgery.  I have reviewed the patient's chart and labs.  Questions were answered to the patient's satisfaction.     [name]

## 2019-04-28 NOTE — ED Notes (Signed)
ED TO INPATIENT HANDOFF REPORT  ED Nurse Name and Phone #: Maebelle Sulton [phone]  S Name/Age/Gender Carlos Barrera 79 y.o. male Room/Bed: ED37A/ED37A  Code Status   Code Status: Full Code  Home/SNF/Other Home Patient oriented to: self, place, time and situation Is this baseline? Yes   Triage Complete: Triage complete  Chief Complaint Pericardial effusion [I31.3]  Triage Note Patient had imaging done at [facility] and reports it showed cardiac tamponade. C/o chest pressure X1 week.     Allergies Allergies  Allergen Reactions  . Bee Venom Anaphylaxis  . Feldene [Piroxicam] Hives    Level of Care/Admitting Diagnosis ED Disposition    ED Disposition Condition [facility]: [place] [[id number]]  Level of Care: Telemetry [5]  Covid Evaluation: Asymptomatic Screening Protocol (No Symptoms)  Diagnosis: Pericardial effusion [678938]  Admitting Physician: Lenore Cordia [[id number]]  Attending Physician: Lenore Cordia [[id number]]  Estimated length of stay: past midnight tomorrow  Certification:: I certify this patient will need inpatient services for at least 2 midnights       B Medical/Surgery History Past Medical History:  Diagnosis Date  . Agent orange exposure   . Asthma    exercise induced asthma   . COPD (chronic obstructive pulmonary disease) ([place])   . DJD (degenerative joint disease)    a. 02/2019 s/p L TKA.  Marland Kitchen GERD (gastroesophageal reflux disease)   . Hearing loss    wears hearing aids  . History of cardiac cath    a. History of 2 cardiac catheterizations, last ~ 10 yrs ago @ [facility]-->reportedly nl.  . History of stress test    a. 2020 - pharmacologic stress testing @ [facility]-->reportedly nl.  . Hyperlipidemia   . Hypertension   . Hypothyroidism   . Insomnia   . Lung cancer ([place])    a. Dx 03/2019.  Marland Kitchen Prostate cancer ([place])   . PTSD (post-traumatic stress disorder)   . Seasonal allergies   . Thyroid disease    Past Surgical  History:  Procedure Laterality Date  . BACK SURGERY     x 2  . COLONOSCOPY     hx polyps  . HERNIA REPAIR    . INCISE AND DRAIN ABCESS     patient denies this procedure 03/03/19  . NECK SURGERY     x 1  . TONSILLECTOMY    . 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 TOTAL KNEE ARTHROPLASTY Left 03/09/2019   Procedure: LEFT TOTAL KNEE ARTHROPLASTY;  Surgeon: Meredith Pel, MD;  Location: [place];  Service: Orthopedics;  Laterality: Left;  . WISDOM TOOTH EXTRACTION       A IV Location/Drains/Wounds Patient Lines/Drains/Airways Status   Active Line/Drains/Airways    Name:   Placement date:   Placement time:   Site:   Days:   Peripheral IV 04/27/19 Right Arm   04/27/19    1625    Arm   1   Incision (Closed) 03/09/19 Knee Left   03/09/19    1322     50          Intake/Output Last 24 hours No intake or output data in the 24 hours ending 04/28/19 8242  Labs/Imaging Results for orders placed or performed during the hospital encounter of 04/27/19 (from the past 48 hour(s))  Basic metabolic panel     Status: Abnormal   Collection Time: 04/27/19  4:24 PM  Result Value Ref Range   Sodium 140 135 - 145  mmol/L   Potassium 4.3 3.5 - 5.1 mmol/L   Chloride 104 98 - 111 mmol/L   CO2 30 22 - 32 mmol/L   Glucose, Bld 111 (H) 70 - 99 mg/dL   BUN 12 8 - 23 mg/dL   Creatinine, Ser 0.96 0.61 - 1.24 mg/dL   Calcium 10.3 8.9 - 10.3 mg/dL   GFR calc non Af Amer >60 >60 mL/min   GFR calc Af Amer >60 >60 mL/min   Anion gap 6 5 - 15    Comment: Performed at [facility], [address] [place], [place] [address]  CBC     Status: Abnormal   Collection Time: 04/27/19  4:24 PM  Result Value Ref Range   WBC 7.0 4.0 - 10.5 K/uL   RBC 3.83 (L) 4.22 - 5.81 MIL/uL   Hemoglobin 11.6 (L) 13.0 - 17.0 g/dL   HCT 36.5 (L) 39.0 - 52.0 %   MCV 95.3 80.0 - 100.0 fL   MCH 30.3 26.0 - 34.0 pg   MCHC 31.8 30.0 - 36.0 g/dL   RDW 16.4 (H) 11.5 - 15.5 %   Platelets 385 150 - 400 K/uL   nRBC  0.0 0.0 - 0.2 %    Comment: Performed at [facility], [address] [place], [place] [address]  Troponin I (High Sensitivity)     Status: Abnormal   Collection Time: 04/27/19  4:24 PM  Result Value Ref Range   Troponin I (High Sensitivity) 121 (HH) <18 ng/L    Comment: CRITICAL RESULT CALLED TO, READ BACK BY AND VERIFIED WITH ELLEN FLUECKIGER 04/27/19 @ 1729  MLK (NOTE) Elevated high sensitivity troponin I (hsTnI) values and significant  changes across serial measurements may suggest ACS but many other  chronic and acute conditions are known to elevate hsTnI results.  Refer to the "Links" section for chest pain algorithms and additional  guidance. Performed at [facility], [place], [place] [address]   Troponin I (High Sensitivity)     Status: Abnormal   Collection Time: 04/27/19  7:20 PM  Result Value Ref Range   Troponin I (High Sensitivity) 139 (HH) <18 ng/L    Comment: CRITICAL VALUE NOTED. VALUE IS CONSISTENT WITH PREVIOUSLY REPORTED/CALLED VALUE / [place] (NOTE) Elevated high sensitivity troponin I (hsTnI) values and significant  changes across serial measurements may suggest ACS but many other  chronic and acute conditions are known to elevate hsTnI results.  Refer to the "Links" section for chest pain algorithms and additional  guidance. Performed at [facility], [address] [place],  [address]   Respiratory Panel by RT PCR (Flu A&B, Covid) - Nasopharyngeal Swab     Status: None   Collection Time: 04/27/19  7:20 PM   Specimen: Nasopharyngeal Swab  Result Value Ref Range   SARS Coronavirus 2 by RT PCR NEGATIVE NEGATIVE    Comment: (NOTE) SARS-CoV-2 target nucleic acids are NOT DETECTED. The SARS-CoV-2 RNA is generally detectable in upper respiratoy specimens during the acute phase of infection. The lowest concentration of SARS-CoV-2 viral copies this assay can detect is 131 copies/mL. A negative result does not  preclude SARS-Cov-2 infection and should not be used as the sole basis for treatment or other patient management decisions. A negative result may occur with  improper specimen collection/handling, submission of specimen other than nasopharyngeal swab, presence of viral mutation(s) within the areas targeted by this assay, and inadequate number of viral copies (<131 copies/mL). A negative result  must be combined with clinical observations, patient history, and epidemiological information. The expected result is Negative. Fact Sheet for Patients:  PinkCheek.be Fact Sheet for Healthcare Providers:  GravelBags.it This test is not yet ap proved or cleared by the [place] FDA and  has been authorized for detection and/or diagnosis of SARS-CoV-2 by FDA under an Emergency Use Authorization (EUA). This EUA will remain  in effect (meaning this test can be used) for the duration of the COVID-19 declaration under Section 564(b)(1) of the Act, 21 U.S.C. section 360bbb-3(b)(1), unless the authorization is terminated or revoked sooner.    Influenza A by PCR NEGATIVE NEGATIVE   Influenza B by PCR NEGATIVE NEGATIVE    Comment: (NOTE) The Xpert Xpress SARS-CoV-2/FLU/RSV assay is intended as an aid in  the diagnosis of influenza from Nasopharyngeal swab specimens and  should not be used as a sole basis for treatment. Nasal washings and  aspirates are unacceptable for Xpert Xpress SARS-CoV-2/FLU/RSV  testing. Fact Sheet for Patients: PinkCheek.be Fact Sheet for Healthcare Providers: GravelBags.it This test is not yet approved or cleared by the [place] FDA and  has been authorized for detection and/or diagnosis of SARS-CoV-2 by  FDA under an Emergency Use Authorization (EUA). This EUA will remain  in effect (meaning this test can be used) for the duration of the  Covid-19  declaration under Section 564(b)(1) of the Act, 21  U.S.C. section 360bbb-3(b)(1), unless the authorization is  terminated or revoked. Performed at [facility], [address] [place], [place] [address]    DG Chest 2 View  Result Date: 04/27/2019 CLINICAL DATA:  Chest pressure shortness of breath for 1 week, recently diagnosed with lung cancer, getting PET scan tomorrow, former smoker, hypertension EXAM: CHEST - 2 VIEW COMPARISON:  04/17/2019 FINDINGS: Normal heart size, mediastinal contours, and pulmonary vascularity. Small RIGHT pleural effusion and basilar atelectasis. Remaining lungs clear. No LEFT pleural effusion, or evidence of pneumothorax. Bones demineralized with prior cervicothoracic fusion. IMPRESSION: Persistent small RIGHT pleural effusion and basilar atelectasis. Electronically Signed   By: Lavonia Dana M.D.   On: 04/27/2019 17:19    Pending Labs Unresulted Labs (From admission, onward)    Start     Ordered   04/28/19 0500  CBC  Tomorrow morning,   STAT     04/27/19 1938   04/28/19 8563  Basic metabolic panel  Tomorrow morning,   STAT     04/27/19 1938          Vitals/Pain Today's Vitals   04/27/19 2106 04/27/19 2300 04/27/19 2330 04/28/19 0030  BP: (!) 159/90 137/78 137/79 137/63  Pulse: 70 77 77 78  Resp: 18 19 (!) 23 (!) 21  Temp:      TempSrc:      SpO2: 98% 96% 96% 97%  Weight:      Height:      PainSc:        Isolation Precautions No active isolations  Medications Medications  sodium chloride flush (NS) 0.9 % injection 3 mL (has no administration in time range)  acetaminophen (TYLENOL) tablet 650 mg (has no administration in time range)    Or  acetaminophen (TYLENOL) suppository 650 mg (has no administration in time range)  albuterol (PROVENTIL) (2.5 MG/3ML) 0.083% nebulizer solution 2.5 mg (has no administration in time range)  amiodarone (PACERONE) tablet 200 mg (has no administration in time range)  levothyroxine (SYNTHROID) tablet 88  mcg (has no administration in time range)  pantoprazole (PROTONIX) EC tablet 40  mg (40 mg Oral Given 04/27/19 2350)  pravastatin (PRAVACHOL) tablet 20 mg (has no administration in time range)  prazosin (MINIPRESS) capsule 10 mg (10 mg Oral Given 04/28/19 0048)  carboxymethylcellulose (REFRESH PLUS) 0.5 % ophthalmic solution 1 drop (has no administration in time range)  sertraline (ZOLOFT) tablet 50 mg (50 mg Oral Given 04/28/19 0048)  tamsulosin (FLOMAX) capsule 0.4 mg (0.4 mg Oral Given 04/28/19 0048)  traZODone (DESYREL) tablet 50 mg (50 mg Oral Given 04/27/19 2350)    Mobility walks Low fall risk   Focused Assessments Cardiac Assessment Handoff:    No results found for: CKTOTAL, CKMB, CKMBINDEX, TROPONINI Lab Results  Component Value Date   DDIMER 7.31 (H) 04/15/2019   Does the Patient currently have chest pain? No     R Recommendations: See Admitting Provider Note  Report given to:   Additional Notes:  NSR

## 2019-04-28 NOTE — Progress Notes (Deleted)
Patient refusing to take PO meds this am.  Dr. Rockey Situ aware and at bedside. Patient states that he has been feeling some difficulty swallowing.  Patient plan is to have pericardiacentesis and transfer to the ICU post procedure.  ICU nurse made aware.

## 2019-04-28 NOTE — Consult Note (Signed)
Cardiology Consult    Patient ID: Carlos Barrera MRN: [id number], DOB/AGE: Mar 22, 1941   Admit date: 04/27/2019 Date of Consult: 04/28/2019  Primary Physician: [facility] Primary Cardiologist: Ida Rogue, MD - new Requesting Provider: S. Kurtis Bushman, MD  Patient Profile    Carlos Barrera is a 79 y.o. male with a history of PAF, remote tob abuse, COPD, HTN, HL, hypothyroidism, PTSD, agent orange exposure, prostate cancer, and recent dx of lung cancer, who is being seen today for the evaluation of pericardial effusion at the request of Dr. Kurtis Bushman.  Past Medical History   Past Medical History:  Diagnosis Date  . Agent orange exposure   . Asthma    exercise induced asthma   . COPD (chronic obstructive pulmonary disease) ([place])   . DJD (degenerative joint disease)    a. 02/2019 s/p L TKA.  Marland Kitchen GERD (gastroesophageal reflux disease)   . Hearing loss    wears hearing aids  . History of cardiac cath    a. History of 2 cardiac catheterizations, last ~ 10 yrs ago @ [facility]-->reportedly nl.  . History of stress test    a. 2020 - pharmacologic stress testing @ [facility]-->reportedly nl.  . Hyperlipidemia   . Hypertension   . Hypothyroidism   . Insomnia   . Lung cancer ([place])    a. Dx 03/2019.  Marland Kitchen Pericardial effusion    a. 03/2019 Echo: EF 60-65%, no rwma, nl RV fxn. Mildly dil LA. Mod pericardia eff w/o tamponade. Mild Ao sclerosis.  . Prostate cancer ([place])   . PTSD (post-traumatic stress disorder)   . Seasonal allergies   . Thyroid disease     Past Surgical History:  Procedure Laterality Date  . BACK SURGERY     x 2  . COLONOSCOPY     hx polyps  . HERNIA REPAIR    . INCISE AND DRAIN ABCESS     patient denies this procedure 03/03/19  . NECK SURGERY     x 1  . TONSILLECTOMY    . 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 TOTAL KNEE ARTHROPLASTY Left 03/09/2019   Procedure: LEFT TOTAL KNEE ARTHROPLASTY;  Surgeon: Meredith Pel, MD;  Location: [place];  Service:  Orthopedics;  Laterality: Left;  . WISDOM TOOTH EXTRACTION       Allergies  Allergies  Allergen Reactions  . Bee Venom Anaphylaxis  . Feldene [Piroxicam] Hives    History of Present Illness    79 y/o ? 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  He ntoes prior cardiac caths performed in the setting of DOE, last ~ 10 yrs ago, w/ reportedly nl cors.  He is s/p L TKA in 02/2019.  Over the past year, he has noted progressive fxnl decline w/ severe DOE w/ minimal activity.  He underwent stress testing @ some point this year @ the [place], which he has reported as being normal.  Despite adjustment of outpt inhaler therapy, and outpt pulm rehab @ [facility], dyspnea persisted and progressed.  He had a Chest CT @ the [place] in December, which was abnl and led to bronchoscopy w/ pathology + for malignancy.  On 12/27, he was admitted to [facility] in the setting of abrupt onset of dyspnea w/ Afib RVR noted by EMS.  He converted on dilt gtt and was transitioned to oral amio given concern for recurrent afib.  HsTrop was mildly elevated w/ flat trend, and ACS was not suspected.  Echo during admission was notable for nl EF w/ a moderate pericardial effusion w/o tamponade physiology.  At discharge, he was scheduled for f/u in [facility] w/ plan for repeat echo.  Of note, TSH was elevated @ 37.14 and his levothyroxine dose was adjusted.  Unfortunately, he was readmitted to [facility] on 1/2 in the setting of chest pressure after eating/drinking.  HsTrop was again mildly elevated @ 81  113.  This was not felt to represent ACS and symptoms were more compatible w/ GI etiology.  He was dc'd on 1/3 w/ PPI and prn Maalox.  Since then, he has cont to note post-prandial chest fullness, stating that he feels like it would improve if he could just belch.  Chest pressure does not worsen w/ activity.  Over the same period of time, he has  noted worsening DOE.  He feels as though he cannot take a good, deep breath.  Though he initially denied pleuritic c/p, upon my examination, he did c/o chest pain w/ deep breathing.  He was seen @ the [place] on 1/7 for a f/u echo and after the test was completed, he was seen by the on-site cardiologist and advised to present to the ED.  After what sounds like an eval for pulsus paradoxus, he was advised that he didn't have to go the ED but did have to see his cardiologist urgently.  He presented to our office on the afternoon of 1/7 with echo disc in-hand.  This was reviewed by Dr. Saunders Revel, who felt that it did show worsening of the pericardial effusion w/ early tamponade physiology.  In that setting, pt was referred to the ED and subsequently admitted.  Currently his biggest concern is that he will have cancel the PET scan that is scheduled for him @ [facility] today.  He denies any dyspnea or orthopnea, but cont to note a sensation of food/fluid being stuck in his esophagus, and now notes pleuritic c/p w/ deep breathing.  Inpatient Medications    . amiodarone  200 mg Oral Daily  . levothyroxine  88 mcg Oral QAC breakfast  . pantoprazole  40 mg Oral Daily  . polyvinyl alcohol  1 drop Both Eyes BID  . potassium chloride  40 mEq Oral Once  . pravastatin  20 mg Oral q1800  . prazosin  10 mg Oral QHS  . sertraline  50 mg Oral QHS  . sodium chloride flush  3 mL Intravenous Q12H  . tamsulosin  0.4 mg Oral Daily  . traZODone  50 mg Oral QHS    Family History    Family History  Problem Relation Age of Onset  . Breast cancer Mother        w/ brain mets -> died in her 61's.  Marland Kitchen Heart attack Father        died @ 74  . Other Brother        multiple medical issues, pt unaware of specifics.   He indicated that his mother is deceased. He indicated that his father is deceased. He indicated that his brother is alive.   Social History    Social History   Socioeconomic History  . Marital status: Widowed    Spouse  name: Not on file  . Number of children: Not on file  . Years of education: Not on file  . Highest education level: Not on file  Occupational History  . Not on file  Tobacco Use  . Smoking status: Former Smoker    Packs/day: 2.00    Years: 20.00    Pack years: 40.00    Types: Cigarettes    Quit date: 04/20/1980    Years since quitting: 39.0  . Smokeless tobacco: Never Used  Substance and Sexual Activity  . Alcohol use: No  . Drug use: No  . Sexual activity: Yes    Birth control/protection: None  Other Topics Concern  . Not on file  Social History Narrative   Lives locally by himself.  Attends pulm rehab @ [facility] but says activity is very limited due to [place].   Social Determinants of Health   Financial Resource Strain:   . Difficulty of Paying Living Expenses: Not on file  Food Insecurity:   . Worried About Charity fundraiser in the Last Year: Not on file  . Ran Out of Food in the Last Year: Not on file  Transportation Needs:   . Lack of Transportation (Medical): Not on file  . Lack of Transportation (Non-Medical): Not on file  Physical Activity:   . Days of Exercise per Week: Not on file  . Minutes of Exercise per Session: Not on file  Stress:   . Feeling of Stress : Not on file  Social Connections:   . Frequency of Communication with Friends and Family: Not on file  . Frequency of Social Gatherings with Friends and Family: Not on file  . Attends Religious Services: Not on file  . Active Member of Clubs or Organizations: Not on file  . Attends Archivist Meetings: Not on file  . Marital Status: Not on file  Intimate Partner Violence:   . Fear of Current or Ex-Partner: Not on file  . Emotionally Abused: Not on file  . Physically Abused: Not on file  . Sexually Abused: Not on file     Review of Systems    General:  No chills, fever, night sweats or weight changes.  Cardiovascular:  +++ pleuritic chest pain, +++ progressive dyspnea on exertion, no edema,  orthopnea, palpitations, paroxysmal nocturnal dyspnea. Dermatological: No rash, lesions/masses Respiratory: No cough, +++ dyspnea Urologic: No hematuria, dysuria Abdominal:   +++ dysphagia w/ heartburn.  No nausea, vomiting, diarrhea, bright red blood per rectum, melena, or hematemesis Neurologic:  No visual changes, wkns, changes in mental status. All other systems reviewed and are otherwise negative except as noted above.  Physical Exam    Blood pressure 103/66, pulse 81, temperature (!) 97.5 F (36.4 C), temperature source Oral, resp. rate 19, height 5\' 8"  (1.727 m), weight 71.7 kg, SpO2 96 %.  Pulsus paradoxus 36mmHg. General: Pleasant, NAD Psych: Normal affect. Neuro: Alert and oriented X 3. Moves all extremities spontaneously. HEENT: Normal  Neck: Supple without bruits or JVD. Lungs:  Resp regular and unlabored, scattered rhonchi, basilar crackles, diffuse exp wheezing. Heart: RRR no s3, s4, or murmurs. Soft friction rub noted @ apex. Abdomen: Soft, non-tender, non-distended, BS + x 4.  Extremities: No clubbing, cyanosis or edema. DP/PT/Radials 2+ and equal bilaterally.  Labs    Cardiac Enzymes Recent Labs  Lab 04/22/19 1233 04/22/19 1916 04/23/19 1459 04/27/19 1624 04/27/19 1920  TROPONINIHS 81* 113* 90* 121* 139*      Lab Results  Component Value Date   WBC 4.2 04/28/2019   HGB 10.3 (L) 04/28/2019   HCT 32.3 (L) 04/28/2019   MCV 93.6 04/28/2019   PLT 269 04/28/2019    Recent Labs  Lab 04/22/19 1233  04/28/19 0602  NA 138 138  K 4.0 3.4*  CL 103 105  CO2 26 23  BUN 16 15  CREATININE 0.96 0.90  CALCIUM 10.6* 9.7  PROT 7.1  --   BILITOT 0.5  --   ALKPHOS 99  --   ALT 17  --   AST 21  --   GLUCOSE 110* 111*   Lab Results  Component Value Date   DDIMER 7.31 (H) 04/15/2019     Radiology Studies    DG Chest 2 View  Result Date: 04/27/2019 CLINICAL DATA:  Chest pressure shortness of breath for 1 week, recently diagnosed with lung cancer, getting  PET scan tomorrow, former smoker, hypertension EXAM: CHEST - 2 VIEW COMPARISON:  04/17/2019 FINDINGS: Normal heart size, mediastinal contours, and pulmonary vascularity. Small RIGHT pleural effusion and basilar atelectasis. Remaining lungs clear. No LEFT pleural effusion, or evidence of pneumothorax. Bones demineralized with prior cervicothoracic fusion. IMPRESSION: Persistent small RIGHT pleural effusion and basilar atelectasis. Electronically Signed   By: Lavonia Dana M.D.   On: 04/27/2019 17:19   CT abd  Result Date: 04/22/2019 CLINICAL DATA:  Emesis since yesterday. New onset of atrial fibrillation. On blood thinners. Sensation of food stuck in esophagus. Lung and prostate cancer. EXAM: CT ABDOMEN AND PELVIS WITH CONTRAST TECHNIQUE: Multidetector CT imaging of the abdomen and pelvis was performed using the standard protocol following bolus administration of intravenous contrast. CONTRAST:  169mL OMNIPAQUE IOHEXOL 300 MG/ML  SOLN COMPARISON:  Plain film of the abdomen of earlier today. FINDINGS: Lower chest: Mild right base subsegmental atelectasis. Normal heart size with minimal pericardial fluid. Small right pleural effusion. Hepatobiliary: Normal liver. Normal gallbladder, without biliary ductal dilatation. Pancreas: Normal, without mass or ductal dilatation. Spleen: Normal in size, without focal abnormality. Adrenals/Urinary Tract: Normal left adrenal gland. Felt to arise exophytic off the right adrenal is a 2.1 x 1.5 cm nodule including on 29/2. When compared to the CT of the chest of 04/15/2019, this is low-density on that study, favoring an adenoma. Normal kidneys, without hydronephrosis.  Normal urinary bladder. Stomach/Bowel: Normal stomach, without wall thickening. Colonic stool burden suggests constipation. Normal terminal ileum and appendix. Normal small bowel. Vascular/Lymphatic: Aortic and branch vessel atherosclerosis. No abdominopelvic adenopathy. Reproductive: Normal prostate. Other: No  significant free fluid. Small bilateral fat containing inguinal hernias. Musculoskeletal: Lumbar spondylosis. Prior interbody fusion material at L4-5. IMPRESSION: 1. No acute process in the abdomen or pelvis. 2. Possible constipation. 3. Small right pleural effusion. 4. Right adrenal adenoma. 5. Aortic Atherosclerosis (ICD10-I70.0). 6. Small pericardial effusion. Electronically Signed   By: Abigail Miyamoto M.D.   On: 04/22/2019 16:50   DG Abd 2 Views  Result Date: 04/22/2019 CLINICAL DATA:  Abdominal pain EXAM: ABDOMEN - 2 VIEW COMPARISON:  None. FINDINGS: The bowel gas pattern is normal. Moderate volume of stool throughout the colon. There is no evidence of free air. No radio-opaque calculi or other significant radiographic abnormality is seen. IMPRESSION: Nonobstructive bowel gas pattern with moderate volume of stool throughout the colon. Electronically Signed   By: Davina Poke D.O.   On: 04/22/2019 13:30   ECG & Cardiac Imaging    RSR, 77, nonspecific ST/T changes w/ baseline artifact - personally reviewed.  Assessment & Plan    1.  Pericardial Effusion:  Recent dx of lung cancer in the setting of progressive DOE over the past year.  Moderate effusion initially noted on echo @ [facility] in late Dec.  He was hemodynamically stable @ the time and  there was no evidence of tamponade physiology.  Over the past 2 wks, he has noted worsening of DOE and also difficulty taking deep breaths, now associated w/ pleuritic c/p.  Repeat echo @ [place] yesterday with evidence for early tamponade resulting in cards visit and admission.  ECG w/o evidence of pericarditis.  Pulsus Paradoxus of 15 mmHg.  Dr. Fletcher Anon to review echo this AM and consider pericardiocentesis.  Pt NPO and willing to proceed if necessary.  Last Eliquis dose was likely several days ago, as he says he is sure he didn't take it yesterday and may missed Wed 1/6 as well. Will add colchicine 0.6mg  BID empirically.   2.  PAF:  Maintaining sinus on oral amio.   Eliquis on hold in setting of potential need for pericardiocentesis - last dose likely several days ago as outlined above.  3.  Elevated troponin:  He has had mild HsTrop elevation dating back to hosp in late Dec.  On this admission - 121  139.  In setting of #1, he has had mild persistent chest pressure worsened by eating/drinking, and also pleuritic c/p and DOE.  Prior reportedly nl stress test earlier this year and reportedly nl remote cath.  I do not think that mild HsTrop elevation reflects ACS.  No plan for ischemic eval @ this time.  4.  Lung Cancer:  Was scheduled for PET scan today @ [facility].  He has cancelled.  I discussed possibly having it done here today w/ nuc med, however they do not do inpt PET scans and he has contacted cancer center to reschedule.  5.  Dysphagia w/ GERD:  Cont PPI.  Symptoms of difficulty swallowing and persistent globus sensation w/ chest pressure have been present since hospitalization @ [facility].  ? If he'd benefit from reglan - defer to IM.  Likely needs swallow eval as outpt.  Signed, Murray Hodgkins, NP 04/28/2019, 8:10 AM  For questions or updates, please contact   Please consult [email or web address] for contact info under Cardiology/STEMI.

## 2019-04-28 NOTE — Progress Notes (Signed)
*  PRELIMINARY RESULTS* Echocardiogram 2D Echocardiogram has been performed.  Sherrie Sport 04/28/2019, 2:49 PM

## 2019-04-29 MED ORDER — AMIODARONE HCL 400 MG PO TABS: 400.0000 mg | ORAL_TABLET | Freq: Two times a day (BID) | ORAL | 0 refills | Status: DC

## 2019-04-29 MED ORDER — AMIODARONE HCL 200 MG PO TABS: 200.0000 mg | ORAL_TABLET | Freq: Two times a day (BID) | ORAL | 0 refills | Status: AC

## 2019-04-29 MED ORDER — AMIODARONE HCL 200 MG PO TABS: 400.0000 mg | ORAL_TABLET | Freq: Two times a day (BID) | ORAL | Status: DC

## 2019-04-29 MED ORDER — AMIODARONE HCL 200 MG PO TABS
200.0000 mg | ORAL_TABLET | Freq: Once | ORAL | Status: AC
  Administered 2019-04-29: 12:00:00 200 mg via ORAL
  Filled 2019-04-29: qty 1

## 2019-04-29 NOTE — Progress Notes (Signed)
  [place] [facility] [place]  [phone]) [phone] [phone]) [phone]  ID: Carlos Barrera OB: Jul 13, 1940  MR#: [id number]  MIW#:[id number]  Patient Care Team: [facility] as PCP - General Rockey Situ Kathlene November, MD as PCP - Cardiology (Cardiology)   Lloyd Huger, MD   04/29/2019 7:52 AM     This encounter was created in error - please disregard.

## 2019-04-29 NOTE — Progress Notes (Signed)
Progress Note  Patient Name: Carlos Barrera Date of Encounter: 04/29/2019  Primary Cardiologist: Ida Rogue, MD   Subjective   Reports having some shortness of breath on exertion Discussed pericardiocentesis attempt yesterday Atrial fibrillation on telemetry this morning, converting to normal sinus rhythm  Inpatient Medications    Scheduled Meds: . amiodarone  400 mg Oral BID  . levothyroxine  88 mcg Oral QAC breakfast  . pantoprazole  40 mg Oral Daily  . polyvinyl alcohol  1 drop Both Eyes BID  . potassium chloride  40 mEq Oral Once  . pravastatin  20 mg Oral q1800  . prazosin  10 mg Oral QHS  . sertraline  50 mg Oral QHS  . sodium chloride flush  3 mL Intravenous Q12H  . sodium chloride flush  3 mL Intravenous Q12H  . tamsulosin  0.4 mg Oral Daily  . traZODone  50 mg Oral QHS   Continuous Infusions:  PRN Meds: acetaminophen **OR** acetaminophen, albuterol   Vital Signs    Vitals:   04/29/19 0609 04/29/19 0741 04/29/19 1026 04/29/19 1215  BP: 110/70 92/62 94/68  112/76  Pulse: 75 85 (!) 119 74  Resp: 15 16 18 18   Temp: 97.6 F (36.4 C) 98.4 F (36.9 C) 97.7 F (36.5 C) 97.8 F (36.6 C)  TempSrc: Oral Oral Oral Oral  SpO2: 96% 95% 99% 98%  Weight:      Height:        Intake/Output Summary (Last 24 hours) at 04/29/2019 1238 Last data filed at 04/29/2019 5638 Gross per 24 hour  Intake 120 ml  Output 100 ml  Net 20 ml   Last 3 Weights 04/29/2019 04/27/2019 04/27/2019  Weight (lbs) (No Data) 158 lb 158 lb 8 oz  Weight (kg) (No Data) 71.668 kg 71.895 kg      Telemetry    Atrial fibrillation/flutter this morning converting to normal sinus rhythm-at least 1 hour of arrhythmia-- personally Reviewed  ECG     - Personally Reviewed  Physical Exam   GEN: No acute distress.   Neck: No JVD Cardiac: RRR, no murmurs, rubs, or gallops.  Respiratory: Clear to auscultation bilaterally. GI: Soft, nontender, non-distended  MS: No edema; No deformity. Neuro:   Nonfocal  Psych: Normal affect   Labs    High Sensitivity Troponin:   Recent Labs  Lab 04/22/19 1233 04/22/19 1916 04/23/19 1459 04/27/19 1624 04/27/19 1920  TROPONINIHS 81* 113* 90* 121* 139*      Chemistry Recent Labs  Lab 04/27/19 1624 04/28/19 0602  NA 140 138  K 4.3 3.4*  CL 104 105  CO2 30 23  GLUCOSE 111* 111*  BUN 12 15  CREATININE 0.96 0.90  CALCIUM 10.3 9.7  GFRNONAA >60 >60  GFRAA >60 >60  ANIONGAP 6 10     Hematology Recent Labs  Lab 04/27/19 1624 04/28/19 0602  WBC 7.0 4.2  RBC 3.83* 3.45*  HGB 11.6* 10.3*  HCT 36.5* 32.3*  MCV 95.3 93.6  MCH 30.3 29.9  MCHC 31.8 31.9  RDW 16.4* 16.1*  PLT 385 269    BNPNo results for input(s): BNP, PROBNP in the last 168 hours.   DDimer No results for input(s): DDIMER in the last 168 hours.   Radiology    DG Chest 2 View  Result Date: 04/27/2019 CLINICAL DATA:  Chest pressure shortness of breath for 1 week, recently diagnosed with lung cancer, getting PET scan tomorrow, former smoker, hypertension EXAM: CHEST - 2 VIEW COMPARISON:  04/17/2019 FINDINGS:  Normal heart size, mediastinal contours, and pulmonary vascularity. Small RIGHT pleural effusion and basilar atelectasis. Remaining lungs clear. No LEFT pleural effusion, or evidence of pneumothorax. Bones demineralized with prior cervicothoracic fusion. IMPRESSION: Persistent small RIGHT pleural effusion and basilar atelectasis. Electronically Signed   By: Lavonia Dana M.D.   On: 04/27/2019 17:19   CARDIAC CATHETERIZATION  Result Date: 04/28/2019 And successful pericardiocentesis due to inability to reach the fluid from the subxiphoid area.  The amount of effusion on echo did not appear large enough to be tapped from this approach. Recommendations: We will obtain a repeat limited echocardiogram.  If there is significant posterior and apical fluid, a pericardial window might be the best option.  ECHOCARDIOGRAM LIMITED  Result Date: 04/28/2019   ECHOCARDIOGRAM  LIMITED REPORT   Patient Name:   Carlos Barrera Date of Exam: 04/28/2019 Medical Rec #:  [id number]       Height:       68.0 in Accession #:    [id number]      Weight:       158.0 lb Date of Birth:  10/12/1940       BSA:          1.85 m Patient Age:    79 years        BP:           106/84 mmHg Patient Gender: M               HR:           76 bpm. Exam Location:  [facility]  Procedure: Limited Echo, Color Doppler and Cardiac Doppler Indications:     Pericardial effusion 423.9  History:         Patient has prior history of Echocardiogram examinations, most                  recent 04/16/2019. Pericardial Effusion.  Sonographer:     Sherrie Sport RDCS (AE) Referring Phys:  [place] Diagnosing Phys: Ida Rogue MD IMPRESSIONS  1. Left ventricular ejection fraction, by visual estimation, is 60 to 65%. The left ventricle has normal function. There is no increased left ventricular wall thickness.  2. Global right ventricle has normal systolc function.The right ventricular size is normal. no increase in right ventricular wall thickness.  3. Moderate pericardial effusion, predominantly around the RV free wall, estimated 1.4 to 1.8 cm, Trace outside the LV free wall, no evidence of tamponade.  4. Tricuspid valve regurgitation is mild.  5. Normal pulmonary artery systolic pressure. FINDINGS  Left Ventricle: Left ventricular ejection fraction, by visual estimation, is 60 to 65%. The left ventricle has normal function. There is no increased left ventricular wall thickness. Normal left atrial pressure. Right Ventricle: The right ventricular size is normal. No increase in right ventricular wall thickness. Global RV systolic function is has normal systolic function. Left Atrium: Left atrial size was normal in size. Right Atrium: Right atrial size was normal in size. Right atrial pressure is estimated at 10 mmHg. Pericardium: A moderately sized pericardial effusion is present is seen. A moderately sized pericardial effusion is  present. Mitral Valve: The mitral valve is normal in structure. No evidence of mitral valve stenosis by observation. No evidence of mitral valve regurgitation. Tricuspid Valve: The tricuspid valve is normal in structure. Tricuspid valve regurgitation is mild. Aortic Valve: The aortic valve is normal in structure. Aortic valve regurgitation is not visualized. The aortic valve is structurally normal, with no evidence of  sclerosis or stenosis. Pulmonic Valve: The pulmonic valve was normal in structure. Pulmonic valve regurgitation is not visualized by color flow Doppler. Pulmonic regurgitation is not visualized by color flow Doppler. Aorta: The aortic root, ascending aorta and aortic arch are all structurally normal, with no evidence of dilitation or obstruction. Venous: The inferior vena cava is normal in size with greater than 50% respiratory variability, suggesting right atrial pressure of 3 mmHg. Shunts: There is no evidence of a patent foramen ovale. No ventricular septal defect is seen or detected. There is no evidence of an atrial septal defect. No atrial level shunt detected by color flow Doppler.  LEFT VENTRICLE          Normals PLAX 2D LVIDd:         3.68 cm  3.6 cm LVIDs:         2.39 cm  1.7 cm LV PW:         1.34 cm  1.4 cm LV IVS:        0.77 cm  1.3 cm LVOT diam:     2.00 cm  2.0 cm LV SV:         37 ml    79 ml LV SV Index:   20.12    45 ml/m2 LVOT Area:     3.14 cm 3.14 cm2  RIGHT VENTRICLE RV Basal diam:  4.02 cm LEFT ATRIUM         Index LA diam:    3.40 cm 1.84 cm/m   AORTA                 Normals Ao Root diam: 3.30 cm 31 mm  SHUNTS Systemic Diam: 2.00 cm  Ida Rogue MD Electronically signed by Ida Rogue MD Signature Date/Time: 04/28/2019/3:20:35 PMThe mitral valve is normal in structure.    Final     Cardiac Studies     Patient Profile     79 year old gentleman with past medical history of paroxysmal atrial fibrillation, smoker/COPD, recent diagnosis of lung cancer, presenting  with shortness of breath, pericardial effusion  Assessment & Plan  Pericardial effusion Moderate in size on echocardiogram Shortness of breath likely multifactorial.  interventional cardiology attempted pericardiocentesis yesterday but was unsuccessful.  Effusion was not circumferential, was predominantly outside the RV free wall and could not be drained --We will need to hold his colchicine for now in the setting of amiodarone oral loading for paroxysmal atrial fibrillation Discussed with pharmacy  Paroxysmal atrial fibrillation Predominant normal sinus rhythm, episode of paroxysmal atrial fibrillation this morning lasting over 1 hour Likely contributing to shortness of breath --Stress-importance of restarting his Eliquis and taking this in a consistent manner --- We will restart amiodarone 400 mg twice daily for 5 to 7 days then down to 200 twice daily daily  Elevated troponin Nontrending, Reports had normal stress test and catheterization No further work-up at this time  Lung cancer PET scan has been rescheduled in 10 days time in [place]  Dysphagia/GERD  continue PPI May need soft mechanical diet  Long discussion with hospitalist service, pharmacy, patient concerning management of his pericardial effusion, atrial fibrillation  Total encounter time more than 35 minutes  Greater than 50% was spent in counseling and coordination of care with the patient    For questions or updates, please contact [facility] Please consult [email or web address] for contact info under        Signed, Ida Rogue, MD  04/29/2019, 12:38 PM

## 2019-04-29 NOTE — Plan of Care (Signed)

## 2019-04-29 NOTE — Progress Notes (Signed)
Duplicate orders for Amiodarone on AVS.  Called Dr Kurtis Bushman to notifiy of duplication on AVS.  Per MD pt needs to take Amiodarone 400mg  BID for 7 days, than take Amiodarone 200mg  BID until seen by cardiologist.  Per MD she specified dose and frequency and for how long on Rx for pharmacy. Per MD to inform pt verbally.  Pt informed verbally of the above and verbalized understanding.

## 2019-04-29 NOTE — Care Management (Signed)
Discharge summary faxed to [facility] [phone]

## 2019-04-29 NOTE — Discharge Summary (Addendum)
GREENE DIODATO NOM:[id number] DOB: [birth year] DOA: 04/27/2019  PCP: [facility]  Admit date: 04/27/2019 Discharge date: 04/29/2019  Admitted From: home Disposition:  home  Recommendations for Outpatient Follow-up:  1. Follow up with PCP in 1 week 2. Please obtain BMP/CBC in one week 3. Please follow up on the following pending results:none 4. F/u with cardiology next week for repeat echo  Home Health:no    Discharge Condition:Stable CODE STATUS: Full Diet recommendation: Heart Healthy / Carb Modified / Regular / Dysphagia  Brief/Interim Summary: Carlos Doxtater Howlandis a 79 y.o.malewith medical history significant forparoxysmal atrial fibrillation on Eliquis and amiodarone, adenocarcinoma of the right lung, COPD, hypertension, hyperlipidemia, remote prostate cancer,agent orange exposure,and hypothyroidism who presents to the ED from the cardiology clinic for worsening pericardial effusion.off note patient recently was hospitalized 04/15/2019-04/21/2019 at [facility] for atrial fibrillation with RVR. He has been started on Eliquis and amiodarone. Echocardiograms admission showed a moderate pericardial effusion. He was readmitted on 04/22/2019-04/23/2019 for chest discomfort. He had mildly elevated troponin which was felt related to demand ischemia. He was cleared for discharge by cardiology.  When he found a recurrence UVA he had an abnormal echocardiogram and was sent to cardiology clinic for further evaluation after reviewing the echo and revealing worsening pericardial effusion patient was sent to ED for admission for possible drainage. He was taken to Cath Lab today unfortunately they were unable to drain the pericardial effusion but not surgical intervention needed at this time.  Cardiology was okay with patient being discharged home with follow-up with them as outpatient for close monitoring and repeat echocardiogram.  Since patient is hemodynamically stable he is  going to be discharged home.  He was started on colchicine too.  Cardiology was also okay for him to resume Eliquis when I spoke to Dr. Rockey Situ about this. Today patient had a small run of A. fib flutter.  I spoke to Dr. Rockey Situ cardiology and he increased patient's amiodarone to 400 mg p.o. twice daily x1 week and then decrease to 200 mg p.o. twice daily and to be continued until he is followed up with cardiology as outpatient.  Otherwise Dr. Rockey Situ felt patient was stable to be discharged home.  Please note patient was supposed to be discharged yesterday however he did not feel comfortable being discharged home so he stayed overnight. Discharge Diagnoses:  Principal Problem:   Pericardial effusion Active Problems:   Adenocarcinoma of right lung (HCC)   Paroxysmal atrial fibrillation (HCC)   Hypothyroidism   Hyperlipidemia    Discharge Instructions  Discharge Instructions    Call MD for:  difficulty breathing, headache or visual disturbances   Complete by: As directed    Call MD for:  difficulty breathing, headache or visual disturbances   Complete by: As directed    Diet - low sodium heart healthy   Complete by: As directed    Diet - low sodium heart healthy   Complete by: As directed    Discharge instructions   Complete by: As directed    F/u with cardiology next week for repeat echo   Discharge instructions   Complete by: As directed    F/u with cardiology next week as scheduled , with repeat echo   Increase activity slowly   Complete by: As directed    Increase activity slowly   Complete by: As directed      Allergies as of 04/29/2019      Reactions   Bee Venom Anaphylaxis   Feldene [  piroxicam] Hives      Medication List    STOP taking these medications   guaifenesin 400 MG Tabs tablet Commonly known as: HUMIBID E     TAKE these medications   alum & mag hydroxide-simeth 200-200-20 MG/5ML suspension Commonly known as: MAALOX/MYLANTA Take 30 mLs by mouth every 6  (six) hours as needed for indigestion or heartburn.   amiodarone 400 MG tablet Commonly known as: Pacerone Take 1 tablet (400 mg total) by mouth 2 (two) times daily. What changed:   medication strength  See the new instructions.   amiodarone 200 MG tablet Commonly known as: PACERONE Take 1 tablet (200 mg total) by mouth 2 (two) times daily. What changed: You were already taking a medication with the same name, and this prescription was added. Make sure you understand how and when to take each.   apixaban 5 MG Tabs tablet Commonly known as: ELIQUIS Take 1 tablet (5 mg total) by mouth 2 (two) times daily.   Calcium Carbonate-Vitamin D 600-400 MG-UNIT tablet Take 1 tablet by mouth 2 (two) times daily.   cetirizine 10 MG tablet Commonly known as: ZYRTEC Take 10 mg by mouth daily.   folic acid 1 MG tablet Commonly known as: FOLVITE Take 1 mg by mouth daily.   levothyroxine 88 MCG tablet Commonly known as: SYNTHROID Take 88 mcg by mouth daily before breakfast.   omeprazole 20 MG capsule Commonly known as: PRILOSEC Take 20 mg by mouth 2 (two) times daily as needed (acid reflux/indigestion.).   ondansetron 4 MG disintegrating tablet Commonly known as: Zofran ODT Take 1 tablet (4 mg total) by mouth every 8 (eight) hours as needed for nausea or vomiting.   polyethylene glycol 17 g packet Commonly known as: MIRALAX / GLYCOLAX Take 17 g by mouth daily.   pravastatin 20 MG tablet Commonly known as: PRAVACHOL Take 20 mg by mouth at bedtime.   prazosin 5 MG capsule Commonly known as: MINIPRESS Take 10 mg by mouth at bedtime.   Refresh Tears 0.5 % Soln Generic drug: carboxymethylcellulose Place 1 drop into both eyes 2 (two) times daily.   sertraline 50 MG tablet Commonly known as: ZOLOFT Take 50 mg by mouth at bedtime.   tamsulosin 0.4 MG Caps capsule Commonly known as: FLOMAX Take 0.4 mg by mouth daily.   traZODone 50 MG tablet Commonly known as: DESYREL Take 50  mg by mouth at bedtime.      Follow-up Information    Wellington [place], MD Follow up.   Specialty: Cardiology Why: We will arrange for a follow-up heart ultrasound in ~ 7-10 days and follow-up with Dr. Fletcher Anon shortly thereafter. Contact information: [address] [place]  [address] [phone]          Allergies  Allergen Reactions  . Bee Venom Anaphylaxis  . Feldene [Piroxicam] Hives    Consultations: Cardiology  Procedures/Studies: DG Chest 2 View  Result Date: 04/27/2019 CLINICAL DATA:  Chest pressure shortness of breath for 1 week, recently diagnosed with lung cancer, getting PET scan tomorrow, former smoker, hypertension EXAM: CHEST - 2 VIEW COMPARISON:  04/17/2019 FINDINGS: Normal heart size, mediastinal contours, and pulmonary vascularity. Small RIGHT pleural effusion and basilar atelectasis. Remaining lungs clear. No LEFT pleural effusion, or evidence of pneumothorax. Bones demineralized with prior cervicothoracic fusion. IMPRESSION: Persistent small RIGHT pleural effusion and basilar atelectasis. Electronically Signed   By: Lavonia Dana M.D.   On: 04/27/2019 17:19   CT Angio Chest PE W and/or  Wo Contrast  Result Date: 04/16/2019 CLINICAL DATA:  Shortness of breath EXAM: CT ANGIOGRAPHY CHEST WITH CONTRAST TECHNIQUE: Multidetector CT imaging of the chest was performed using the standard protocol during bolus administration of intravenous contrast. Multiplanar CT image reconstructions and MIPs were obtained to evaluate the vascular anatomy. CONTRAST:  18mL OMNIPAQUE IOHEXOL 350 MG/ML SOLN COMPARISON:  None. FINDINGS: Cardiovascular: There is a optimal opacification of the pulmonary arteries. There is no central,segmental, or subsegmental filling defects within the pulmonary arteries. There is mild cardiomegaly. A small to moderate pericardial effusion is present. Mild scattered aortic atherosclerosis is seen. There is normal three-vessel brachiocephalic  anatomy. Mediastinum/Nodes: Subcarinal adenopathy is seen measuring 2.2 cm in AP dimension. There is also a right hilar adenopathy which measures 1.5 cm in AP dimension. Thyroid gland, trachea, and esophagus demonstrate no significant findings. Lungs/Pleura: There is small peripheral patchy airspace opacity seen within the posterior right upper and lower lung. There is also tree-in-bud opacity seen in the periphery of the right lung base. There is a small right and trace left pleural effusion. Mildly increased interstitial thickening seen within both upper lungs. Upper Abdomen: No acute abnormalities present in the visualized portions of the upper abdomen. Musculoskeletal: No chest wall abnormality. No acute or significant osseous findings. Review of the MIP images confirms the above findings. IMPRESSION: No central, segmental, or subsegmental pulmonary embolism. Small to moderate pericardial effusion Subcarinal and right hilar adenopathy which could be reactive. Patchy/ground-glass opacity in the posterior right lung which could be due to atelectasis and/or infectious etiology. Small right and trace left pleural effusion. Mildly increased interstitial thickening seen within both upper lungs likely due to mild interstitial edema. Aortic Atherosclerosis (ICD10-I70.0). Electronically Signed   By: Prudencio Pair M.D.   On: 04/16/2019 00:04   CT abd  Result Date: 04/22/2019 CLINICAL DATA:  Emesis since yesterday. New onset of atrial fibrillation. On blood thinners. Sensation of food stuck in esophagus. Lung and prostate cancer. EXAM: CT ABDOMEN AND PELVIS WITH CONTRAST TECHNIQUE: Multidetector CT imaging of the abdomen and pelvis was performed using the standard protocol following bolus administration of intravenous contrast. CONTRAST:  149mL OMNIPAQUE IOHEXOL 300 MG/ML  SOLN COMPARISON:  Plain film of the abdomen of earlier today. FINDINGS: Lower chest: Mild right base subsegmental atelectasis. Normal heart size with  minimal pericardial fluid. Small right pleural effusion. Hepatobiliary: Normal liver. Normal gallbladder, without biliary ductal dilatation. Pancreas: Normal, without mass or ductal dilatation. Spleen: Normal in size, without focal abnormality. Adrenals/Urinary Tract: Normal left adrenal gland. Felt to arise exophytic off the right adrenal is a 2.1 x 1.5 cm nodule including on 29/2. When compared to the CT of the chest of 04/15/2019, this is low-density on that study, favoring an adenoma. Normal kidneys, without hydronephrosis.  Normal urinary bladder. Stomach/Bowel: Normal stomach, without wall thickening. Colonic stool burden suggests constipation. Normal terminal ileum and appendix. Normal small bowel. Vascular/Lymphatic: Aortic and branch vessel atherosclerosis. No abdominopelvic adenopathy. Reproductive: Normal prostate. Other: No significant free fluid. Small bilateral fat containing inguinal hernias. Musculoskeletal: Lumbar spondylosis. Prior interbody fusion material at L4-5. IMPRESSION: 1. No acute process in the abdomen or pelvis. 2. Possible constipation. 3. Small right pleural effusion. 4. Right adrenal adenoma. 5. Aortic Atherosclerosis (ICD10-I70.0). 6. Small pericardial effusion. Electronically Signed   By: Abigail Miyamoto M.D.   On: 04/22/2019 16:50   CARDIAC CATHETERIZATION  Result Date: 04/28/2019 And successful pericardiocentesis due to inability to reach the fluid from the subxiphoid area.  The amount of effusion on  echo did not appear large enough to be tapped from this approach. Recommendations: We will obtain a repeat limited echocardiogram.  If there is significant posterior and apical fluid, a pericardial window might be the best option.  DG CHEST PORT 1 VIEW  Result Date: 04/17/2019 CLINICAL DATA:  Atrial fibrillation and shortness of breath. Evaluate for pulmonary infiltrates. EXAM: PORTABLE CHEST 1 VIEW COMPARISON:  04/16/2019 FINDINGS: Heart size appears within normal limits. New  small right pleural effusion identified. Mild asymmetric edema identified within the right mid and right lower lung. Left lung is clear. IMPRESSION: 1. Small right effusion with mild asymmetric edema in the right mid and right lower lung. Electronically Signed   By: Kerby Moors M.D.   On: 04/17/2019 09:27   DG Chest Portable 1 View  Result Date: 04/16/2019 CLINICAL DATA:  Shortness of breath EXAM: PORTABLE CHEST 1 VIEW COMPARISON:  None. FINDINGS: The heart size and mediastinal contours are within normal limits. There is mildly increased interstitial markings seen throughout both lungs. There is patchy airspace opacity at the periphery of the right lung base. The visualized skeletal structures are unremarkable. IMPRESSION: Mildly increased interstitial markings throughout both lungs with patchy airspace opacity in the right lung base. This could be due to asymmetric edema and/or infectious etiology. Electronically Signed   By: Prudencio Pair M.D.   On: 04/16/2019 00:39   DG Abd 2 Views  Result Date: 04/22/2019 CLINICAL DATA:  Abdominal pain EXAM: ABDOMEN - 2 VIEW COMPARISON:  None. FINDINGS: The bowel gas pattern is normal. Moderate volume of stool throughout the colon. There is no evidence of free air. No radio-opaque calculi or other significant radiographic abnormality is seen. IMPRESSION: Nonobstructive bowel gas pattern with moderate volume of stool throughout the colon. Electronically Signed   By: Davina Poke D.O.   On: 04/22/2019 13:30   ECHOCARDIOGRAM COMPLETE  Result Date: 04/16/2019   ECHOCARDIOGRAM REPORT   Patient Name:   Carlos Barrera Date of Exam: 04/16/2019 Medical Rec #:  [id number]       Height:       68.0 in Accession #:    [id number]      Weight:       154.8 lb Date of Birth:  [birth year]       BSA:          1.83 m Patient Age:    68 years        BP:           105/54 mmHg Patient Gender: M               HR:           74 bpm. Exam Location:  Inpatient Procedure: 2D Echo, Color  Doppler and Cardiac Doppler Indications:    Atrial fibrillation  History:        Patient has no prior history of Echocardiogram examinations.                 Lung and prostate cancer.  Sonographer:    Merrie Roof RDCS Referring Phys: [place]  1. Left ventricular ejection fraction, by visual estimation, is 60 to 65%. The left ventricle has normal function. There is no left ventricular hypertrophy.  2. The left ventricle has no regional wall motion abnormalities.  3. Global right ventricle has normal systolic function.The right ventricular size is normal. No increase in right ventricular wall thickness.  4. Left atrial size was mildly dilated.  5. Right  atrial size was normal.  6. Moderate pericardial effusion.  7. The pericardial effusion is anterior to the right ventricle.  8. Mild mitral annular calcification.  9. The mitral valve is normal in structure. No evidence of mitral valve regurgitation. No evidence of mitral stenosis. 10. The tricuspid valve is normal in structure. 11. The aortic valve is normal in structure. Aortic valve regurgitation is not visualized. Mild aortic valve sclerosis without stenosis. 12. The pulmonic valve was normal in structure. Pulmonic valve regurgitation is not visualized. 13. The inferior vena cava is normal in size with greater than 50% respiratory variability, suggesting right atrial pressure of 3 mmHg. 14. No prior Echocardiogram. FINDINGS  Left Ventricle: Left ventricular ejection fraction, by visual estimation, is 60 to 65%. The left ventricle has normal function. The left ventricle has no regional wall motion abnormalities. There is no left ventricular hypertrophy. Left ventricular diastolic parameters were normal. Normal left atrial pressure. Right Ventricle: The right ventricular size is normal. No increase in right ventricular wall thickness. Global RV systolic function is has normal systolic function. Left Atrium: Left atrial size was  mildly dilated. Right Atrium: Right atrial size was normal in size Pericardium: A moderately sized pericardial effusion is present. The pericardial effusion is anterior to the right ventricle. There is no evidence of cardiac tamponade. Mitral Valve: The mitral valve is normal in structure. Mild mitral annular calcification. No evidence of mitral valve regurgitation. No evidence of mitral valve stenosis by observation. Tricuspid Valve: The tricuspid valve is normal in structure. Tricuspid valve regurgitation is not demonstrated. Aortic Valve: The aortic valve is normal in structure. Aortic valve regurgitation is not visualized. Mild aortic valve sclerosis is present, with no evidence of aortic valve stenosis. Pulmonic Valve: The pulmonic valve was normal in structure. Pulmonic valve regurgitation is not visualized. Pulmonic regurgitation is not visualized. Aorta: The aortic root, ascending aorta and aortic arch are all structurally normal, with no evidence of dilitation or obstruction. Venous: The inferior vena cava was not well visualized. The inferior vena cava is normal in size with greater than 50% respiratory variability, suggesting right atrial pressure of 3 mmHg. IAS/Shunts: No atrial level shunt detected by color flow Doppler. There is no evidence of a patent foramen ovale. No ventricular septal defect is seen or detected. There is no evidence of an atrial septal defect.  LEFT VENTRICLE PLAX 2D LVIDd:         4.35 cm       Diastology LVIDs:         2.82 cm       LV e' lateral:   7.83 cm/s LV PW:         1.13 cm       LV E/e' lateral: 8.7 LV IVS:        1.04 cm       LV e' medial:    8.38 cm/s LVOT diam:     1.90 cm       LV E/e' medial:  8.1 LV SV:         55 ml LV SV Index:   30.05 LVOT Area:     2.84 cm  LV Volumes (MOD) LV area d, A4C:    24.20 cm LV area s, A4C:    12.20 cm LV major d, A4C:   7.25 cm LV major s, A4C:   5.94 cm LV vol d, MOD A4C: 66.1 ml LV vol s, MOD A4C: 20.9 ml LV SV MOD A4C:  66.1 ml RIGHT VENTRICLE RV Basal diam:  3.54 cm LEFT ATRIUM             Index       RIGHT ATRIUM           Index LA diam:        4.20 cm 2.29 cm/m  RA Area:     12.80 cm LA Vol (A2C):   42.1 ml 22.97 ml/m RA Volume:   27.60 ml  15.06 ml/m LA Vol (A4C):   51.6 ml 28.15 ml/m LA Biplane Vol: 50.0 ml 27.28 ml/m  AORTIC VALVE LVOT Vmax:   71.10 cm/s LVOT Vmean:  47.800 cm/s LVOT VTI:    0.138 m  AORTA Ao Root diam: 3.60 cm Ao Asc diam:  3.60 cm MITRAL VALVE MV Area (PHT): 4.29 cm             SHUNTS MV PHT:        51.33 msec           Systemic VTI:  0.14 m MV Decel Time: 177 msec             Systemic Diam: 1.90 cm MV E velocity: 68.10 cm/s 103 cm/s MV A velocity: 56.10 cm/s 70.3 cm/s MV E/A ratio:  1.21       1.5  Mihai Croitoru MD Electronically signed by Sanda Klein MD Signature Date/Time: 04/16/2019/5:15:08 PM    Final    ECHOCARDIOGRAM LIMITED  Result Date: 04/28/2019   ECHOCARDIOGRAM LIMITED REPORT   Patient Name:   Carlos Barrera Date of Exam: 04/28/2019 Medical Rec #:  [id number]       Height:       68.0 in Accession #:    [id number]      Weight:       158.0 lb Date of Birth:  11-Aug-1940       BSA:          1.85 m Patient Age:    63 years        BP:           106/84 mmHg Patient Gender: M               HR:           76 bpm. Exam Location:  [facility]  Procedure: Limited Echo, Color Doppler and Cardiac Doppler Indications:     Pericardial effusion 423.9  History:         Patient has prior history of Echocardiogram examinations, most                  recent 04/16/2019. Pericardial Effusion.  Sonographer:     Sherrie Sport RDCS (AE) Referring Phys:  [place] Diagnosing Phys: Ida Rogue MD IMPRESSIONS  1. Left ventricular ejection fraction, by visual estimation, is 60 to 65%. The left ventricle has normal function. There is no increased left ventricular wall thickness.  2. Global right ventricle has normal systolc function.The right ventricular size is normal. no increase in right ventricular wall  thickness.  3. Moderate pericardial effusion, predominantly around the RV free wall, estimated 1.4 to 1.8 cm, Trace outside the LV free wall, no evidence of tamponade.  4. Tricuspid valve regurgitation is mild.  5. Normal pulmonary artery systolic pressure. FINDINGS  Left Ventricle: Left ventricular ejection fraction, by visual estimation, is 60 to 65%. The left ventricle has normal function. There is no increased left ventricular wall thickness. Normal left atrial pressure. Right Ventricle: The  right ventricular size is normal. No increase in right ventricular wall thickness. Global RV systolic function is has normal systolic function. Left Atrium: Left atrial size was normal in size. Right Atrium: Right atrial size was normal in size. Right atrial pressure is estimated at 10 mmHg. Pericardium: A moderately sized pericardial effusion is present is seen. A moderately sized pericardial effusion is present. Mitral Valve: The mitral valve is normal in structure. No evidence of mitral valve stenosis by observation. No evidence of mitral valve regurgitation. Tricuspid Valve: The tricuspid valve is normal in structure. Tricuspid valve regurgitation is mild. Aortic Valve: The aortic valve is normal in structure. Aortic valve regurgitation is not visualized. The aortic valve is structurally normal, with no evidence of sclerosis or stenosis. Pulmonic Valve: The pulmonic valve was normal in structure. Pulmonic valve regurgitation is not visualized by color flow Doppler. Pulmonic regurgitation is not visualized by color flow Doppler. Aorta: The aortic root, ascending aorta and aortic arch are all structurally normal, with no evidence of dilitation or obstruction. Venous: The inferior vena cava is normal in size with greater than 50% respiratory variability, suggesting right atrial pressure of 3 mmHg. Shunts: There is no evidence of a patent foramen ovale. No ventricular septal defect is seen or detected. There is no evidence  of an atrial septal defect. No atrial level shunt detected by color flow Doppler.  LEFT VENTRICLE          Normals PLAX 2D LVIDd:         3.68 cm  3.6 cm LVIDs:         2.39 cm  1.7 cm LV PW:         1.34 cm  1.4 cm LV IVS:        0.77 cm  1.3 cm LVOT diam:     2.00 cm  2.0 cm LV SV:         37 ml    79 ml LV SV Index:   20.12    45 ml/m2 LVOT Area:     3.14 cm 3.14 cm2  RIGHT VENTRICLE RV Basal diam:  4.02 cm LEFT ATRIUM         Index LA diam:    3.40 cm 1.84 cm/m   AORTA                 Normals Ao Root diam: 3.30 cm 31 mm  SHUNTS Systemic Diam: 2.00 cm  Ida Rogue MD Electronically signed by Ida Rogue MD Signature Date/Time: 04/28/2019/3:20:35 PMThe mitral valve is normal in structure.    Final       Subjective: Patient has no shortness of breath, chest pain, dizziness or any other symptoms. Discharge Exam: Vitals:   04/29/19 1026 04/29/19 1215  BP: 94/68 112/76  Pulse: (!) 119 74  Resp: 18 18  Temp: 97.7 F (36.5 C) 97.8 F (36.6 C)  SpO2: 99% 98%   Vitals:   04/29/19 0609 04/29/19 0741 04/29/19 1026 04/29/19 1215  BP: 110/70 92/62 94/68  112/76  Pulse: 75 85 (!) 119 74  Resp: 15 16 18 18   Temp: 97.6 F (36.4 C) 98.4 F (36.9 C) 97.7 F (36.5 C) 97.8 F (36.6 C)  TempSrc: Oral Oral Oral Oral  SpO2: 96% 95% 99% 98%  Weight:      Height:        General: Pt is alert, awake, not in acute distress Cardiovascular: RRR, S1/S2 +, no rubs, no gallops Respiratory: CTA bilaterally, no wheezing, no rhonchi  Abdominal: Soft, NT, ND, bowel sounds + Extremities: no edema, no cyanosis Neuro: Alert oriented x3 no focal deficits noted   The results of significant diagnostics from this hospitalization (including imaging, microbiology, ancillary and laboratory) are listed below for reference.     Microbiology: Recent Results (from the past 240 hour(s))  SARS CORONAVIRUS 2 (TAT 6-24 HRS) Nasopharyngeal Nasopharyngeal Swab     Status: None   Collection Time: 04/22/19  7:16 PM    Specimen: Nasopharyngeal Swab  Result Value Ref Range Status   SARS Coronavirus 2 NEGATIVE NEGATIVE Final    Comment: (NOTE) SARS-CoV-2 target nucleic acids are NOT DETECTED. The SARS-CoV-2 RNA is generally detectable in upper and lower respiratory specimens during the acute phase of infection. Negative results do not preclude SARS-CoV-2 infection, do not rule out co-infections with other pathogens, and should not be used as the sole basis for treatment or other patient management decisions. Negative results must be combined with clinical observations, patient history, and epidemiological information. The expected result is Negative. Fact Sheet for Patients: SugarRoll.be Fact Sheet for Healthcare Providers: https://www.woods-mathews.com/ This test is not yet approved or cleared by the [place] FDA and  has been authorized for detection and/or diagnosis of SARS-CoV-2 by FDA under an Emergency Use Authorization (EUA). This EUA will remain  in effect (meaning this test can be used) for the duration of the COVID-19 declaration under Section 56 4(b)(1) of the Act, 21 U.S.C. section 360bbb-3(b)(1), unless the authorization is terminated or revoked sooner. Performed at [facility], [place] [address], [place], [place] [address]   Respiratory Panel by RT PCR (Flu A&B, Covid) - Nasopharyngeal Swab     Status: None   Collection Time: 04/27/19  7:20 PM   Specimen: Nasopharyngeal Swab  Result Value Ref Range Status   SARS Coronavirus 2 by RT PCR NEGATIVE NEGATIVE Final    Comment: (NOTE) SARS-CoV-2 target nucleic acids are NOT DETECTED. The SARS-CoV-2 RNA is generally detectable in upper respiratoy specimens during the acute phase of infection. The lowest concentration of SARS-CoV-2 viral copies this assay can detect is 131 copies/mL. A negative result does not preclude SARS-Cov-2 infection and should not be used as the sole basis for  treatment or other patient management decisions. A negative result may occur with  improper specimen collection/handling, submission of specimen other than nasopharyngeal swab, presence of viral mutation(s) within the areas targeted by this assay, and inadequate number of viral copies (<131 copies/mL). A negative result must be combined with clinical observations, patient history, and epidemiological information. The expected result is Negative. Fact Sheet for Patients:  PinkCheek.be Fact Sheet for Healthcare Providers:  GravelBags.it This test is not yet ap proved or cleared by the [place] FDA and  has been authorized for detection and/or diagnosis of SARS-CoV-2 by FDA under an Emergency Use Authorization (EUA). This EUA will remain  in effect (meaning this test can be used) for the duration of the COVID-19 declaration under Section 564(b)(1) of the Act, 21 U.S.C. section 360bbb-3(b)(1), unless the authorization is terminated or revoked sooner.    Influenza A by PCR NEGATIVE NEGATIVE Final   Influenza B by PCR NEGATIVE NEGATIVE Final    Comment: (NOTE) The Xpert Xpress SARS-CoV-2/FLU/RSV assay is intended as an aid in  the diagnosis of influenza from Nasopharyngeal swab specimens and  should not be used as a sole basis for treatment. Nasal washings and  aspirates are unacceptable for Xpert Xpress SARS-CoV-2/FLU/RSV  testing. Fact Sheet for Patients: PinkCheek.be  Fact Sheet for Healthcare Providers: GravelBags.it This test is not yet approved or cleared by the [place] FDA and  has been authorized for detection and/or diagnosis of SARS-CoV-2 by  FDA under an Emergency Use Authorization (EUA). This EUA will remain  in effect (meaning this test can be used) for the duration of the  Covid-19 declaration under Section 564(b)(1) of the Act, 21  U.S.C. section  360bbb-3(b)(1), unless the authorization is  terminated or revoked. Performed at [facility], [address] [place],  [address]      Labs: BNP (last 3 results) No results for input(s): BNP in the last 8760 hours. Basic Metabolic Panel: Recent Labs  Lab 04/22/19 1233 04/27/19 1624 04/28/19 0602  NA 138 140 138  K 4.0 4.3 3.4*  CL 103 104 105  CO2 26 30 23   GLUCOSE 110* 111* 111*  BUN 16 12 15   CREATININE 0.96 0.96 0.90  CALCIUM 10.6* 10.3 9.7   Liver Function Tests: Recent Labs  Lab 04/22/19 1233  AST 21  ALT 17  ALKPHOS 99  BILITOT 0.5  PROT 7.1  ALBUMIN 3.4*   Recent Labs  Lab 04/22/19 1233  LIPASE 38   No results for input(s): AMMONIA in the last 168 hours. CBC: Recent Labs  Lab 04/22/19 1233 04/27/19 1624 04/28/19 0602  WBC 6.0 7.0 4.2  HGB 12.0* 11.6* 10.3*  HCT 35.4* 36.5* 32.3*  MCV 89.4 95.3 93.6  PLT 350 385 269   Cardiac Enzymes: No results for input(s): CKTOTAL, CKMB, CKMBINDEX, TROPONINI in the last 168 hours. BNP: Invalid input(s): POCBNP CBG: No results for input(s): GLUCAP in the last 168 hours. D-Dimer No results for input(s): DDIMER in the last 72 hours. Hgb A1c No results for input(s): HGBA1C in the last 72 hours. Lipid Profile No results for input(s): CHOL, HDL, LDLCALC, TRIG, CHOLHDL, LDLDIRECT in the last 72 hours. Thyroid function studies No results for input(s): TSH, T4TOTAL, T3FREE, THYROIDAB in the last 72 hours.  Invalid input(s): FREET3 Anemia work up No results for input(s): VITAMINB12, FOLATE, FERRITIN, TIBC, IRON, RETICCTPCT in the last 72 hours. Urinalysis    Component Value Date/Time   COLORURINE YELLOW (A) 04/22/2019 1916   APPEARANCEUR CLEAR (A) 04/22/2019 1916   LABSPEC >1.046 (H) 04/22/2019 1916   PHURINE 5.0 04/22/2019 1916   GLUCOSEU NEGATIVE 04/22/2019 1916   HGBUR NEGATIVE 04/22/2019 [place] NEGATIVE 04/22/2019 1916   KETONESUR NEGATIVE 04/22/2019 1916   PROTEINUR  30 (A) 04/22/2019 1916   UROBILINOGEN 1.0 05/22/2009 1525   NITRITE NEGATIVE 04/22/2019 1916   LEUKOCYTESUR NEGATIVE 04/22/2019 1916   Sepsis Labs Invalid input(s): PROCALCITONIN,  WBC,  LACTICIDVEN Microbiology Recent Results (from the past 240 hour(s))  SARS CORONAVIRUS 2 (TAT 6-24 HRS) Nasopharyngeal Nasopharyngeal Swab     Status: None   Collection Time: 04/22/19  7:16 PM   Specimen: Nasopharyngeal Swab  Result Value Ref Range Status   SARS Coronavirus 2 NEGATIVE NEGATIVE Final    Comment: (NOTE) SARS-CoV-2 target nucleic acids are NOT DETECTED. The SARS-CoV-2 RNA is generally detectable in upper and lower respiratory specimens during the acute phase of infection. Negative results do not preclude SARS-CoV-2 infection, do not rule out co-infections with other pathogens, and should not be used as the sole basis for treatment or other patient management decisions. Negative results must be combined with clinical observations, patient history, and epidemiological information. The expected result is Negative. Fact Sheet for Patients: SugarRoll.be Fact Sheet for Healthcare Providers: https://www.woods-mathews.com/ This  test is not yet approved or cleared by the [place] and  has been authorized for detection and/or diagnosis of SARS-CoV-2 by FDA under an Emergency Use Authorization (EUA). This EUA will remain  in effect (meaning this test can be used) for the duration of the COVID-19 declaration under Section 56 4(b)(1) of the Act, 21 U.S.C. section 360bbb-3(b)(1), unless the authorization is terminated or revoked sooner. Performed at [facility], [place] [address], [place], [place] [address]   Respiratory Panel by RT PCR (Flu A&B, Covid) - Nasopharyngeal Swab     Status: None   Collection Time: 04/27/19  7:20 PM   Specimen: Nasopharyngeal Swab  Result Value Ref Range Status   SARS Coronavirus 2 by RT PCR NEGATIVE NEGATIVE  Final    Comment: (NOTE) SARS-CoV-2 target nucleic acids are NOT DETECTED. The SARS-CoV-2 RNA is generally detectable in upper respiratoy specimens during the acute phase of infection. The lowest concentration of SARS-CoV-2 viral copies this assay can detect is 131 copies/mL. A negative result does not preclude SARS-Cov-2 infection and should not be used as the sole basis for treatment or other patient management decisions. A negative result may occur with  improper specimen collection/handling, submission of specimen other than nasopharyngeal swab, presence of viral mutation(s) within the areas targeted by this assay, and inadequate number of viral copies (<131 copies/mL). A negative result must be combined with clinical observations, patient history, and epidemiological information. The expected result is Negative. Fact Sheet for Patients:  PinkCheek.be Fact Sheet for Healthcare Providers:  GravelBags.it This test is not yet ap proved or cleared by the [place] FDA and  has been authorized for detection and/or diagnosis of SARS-CoV-2 by FDA under an Emergency Use Authorization (EUA). This EUA will remain  in effect (meaning this test can be used) for the duration of the COVID-19 declaration under Section 564(b)(1) of the Act, 21 U.S.C. section 360bbb-3(b)(1), unless the authorization is terminated or revoked sooner.    Influenza A by PCR NEGATIVE NEGATIVE Final   Influenza B by PCR NEGATIVE NEGATIVE Final    Comment: (NOTE) The Xpert Xpress SARS-CoV-2/FLU/RSV assay is intended as an aid in  the diagnosis of influenza from Nasopharyngeal swab specimens and  should not be used as a sole basis for treatment. Nasal washings and  aspirates are unacceptable for Xpert Xpress SARS-CoV-2/FLU/RSV  testing. Fact Sheet for Patients: PinkCheek.be Fact Sheet for Healthcare  Providers: GravelBags.it This test is not yet approved or cleared by the [place] FDA and  has been authorized for detection and/or diagnosis of SARS-CoV-2 by  FDA under an Emergency Use Authorization (EUA). This EUA will remain  in effect (meaning this test can be used) for the duration of the  Covid-19 declaration under Section 564(b)(1) of the Act, 21  U.S.C. section 360bbb-3(b)(1), unless the authorization is  terminated or revoked. Performed at [facility], [address] [place], [place] [address]    Addendum: cards decided to d/c colchicine as they have increased amiodarone. It was discontinued. I tried to get hold of his pharmacy there was no answer.they were closed. Told pt not to take it  Time coordinating discharge: Over 30 minutes  SIGNED:   Nolberto Hanlon, MD  [facility] 04/29/2019, 12:23 PM Pager   If 7PM-7AM, please contact night-coverage [email or web address] Password [email or web address]

## 2019-04-29 NOTE — Progress Notes (Signed)
Notified that patient would like to receive his medication. Upon entering the room, he expressed frustrations regarding his medication not being left at the bedside to consume at his free will. He stated "The day shift nurses will leave it. That's bullshit. You're just covering your ass."   Writer attempted to educate the patient that medication should not be left at the bedside and that I have no control over what other nurses do during the day, but that I would always follow protocol to protect my license. This frustrated the patient even more, so Probation officer called Kristine Garbe, Agricultural consultant to bedside to attempt to resolve issues.   Patient expressed that he was upset that I had came into the room earlier and turned on a bright light without warning or introduction. Patient reminded that there was an introduction but he did not wake up or respond until light was turned on. He then continued to vent about not allowing medications to be left in room on bedside table. Patient educated on protocol.   He also reported hearing a conversation that occurred outside of his door, this conversation did not occur.   Will continue to monitor patient.

## 2019-04-29 NOTE — Progress Notes (Signed)
Pt is being discharged home.  Discharge papers given and explained to pt.  Pt verbalized understanding.  Meds and f/u appointments reviewed.

## 2019-04-29 NOTE — Progress Notes (Signed)
CCMD documented HR 129 ST with PAC at 0900. That made MEWS go up to 3. MEWS still 3, HR goes up to 140, not sustaining. Rest of VSS. Pt denies chest pain or any distress and wants to go home. Also notified MD that SBP in the 90's and flomax due.    Per MD to be given this evening and to hold discharge for now. Pt needs to be seen by cardiologist prior to discharge.

## 2019-05-01 ENCOUNTER — Encounter: Payer: Self-pay | Admitting: Oncology

## 2019-05-01 ENCOUNTER — Inpatient Hospital Stay: Payer: No Typology Code available for payment source | Admitting: Oncology

## 2019-05-01 ENCOUNTER — Other Ambulatory Visit: Payer: Self-pay

## 2019-05-02 ENCOUNTER — Ambulatory Visit
Admission: RE | Admit: 2019-05-02 | Discharge: 2019-05-02 | Disposition: A | Discharge status: Home / Self Care | Payer: Self-pay | Source: Ambulatory Visit | Attending: Oncology | Admitting: Oncology

## 2019-05-02 ENCOUNTER — Other Ambulatory Visit: Payer: Self-pay | Admitting: Oncology

## 2019-05-02 DIAGNOSIS — C3490 Malignant neoplasm of unspecified part of unspecified bronchus or lung: Secondary | ICD-10-CM

## 2019-05-05 NOTE — Progress Notes (Signed)
  [place]  [phone] [phone]) [phone]  ID: Hilarie Fredrickson OB: Nov 17, 1940  MR#: [id number]  VVK#:[id number]  Patient Care Team: [facility], [facility] as PCP - General Rockey Situ Kathlene November, MD as PCP - Cardiology (Cardiology)   Lloyd Huger, MD   05/05/2019 6:43 AM     This encounter was created in error - please disregard.

## 2019-05-08 ENCOUNTER — Ambulatory Visit: Payer: TRICARE For Life (TFL) | Admitting: Cardiology

## 2019-05-09 ENCOUNTER — Encounter: Payer: Self-pay | Admitting: Oncology

## 2019-05-09 ENCOUNTER — Ambulatory Visit (HOSPITAL_COMMUNITY)
Admission: RE | Admit: 2019-05-09 | Discharge: 2019-05-09 | Disposition: A | Discharge status: Home / Self Care | Payer: No Typology Code available for payment source | Source: Ambulatory Visit | Attending: Internal Medicine | Admitting: Internal Medicine

## 2019-05-09 ENCOUNTER — Other Ambulatory Visit: Payer: Self-pay

## 2019-05-09 DIAGNOSIS — D0220 Carcinoma in situ of unspecified bronchus and lung: Secondary | ICD-10-CM | POA: Diagnosis not present

## 2019-05-09 LAB — GLUCOSE, CAPILLARY: Glucose-Capillary: 103 mg/dL — ABNORMAL HIGH (ref 70–99)

## 2019-05-09 MED ORDER — FLUDEOXYGLUCOSE F - 18 (FDG) INJECTION
7.8600 | Freq: Once | INTRAVENOUS | Status: AC | PRN
  Administered 2019-05-09: 7.86 via INTRAVENOUS

## 2019-05-09 NOTE — Progress Notes (Signed)
Patient prescreened for appointment. Patient has no concerns or questions.  

## 2019-05-10 ENCOUNTER — Inpatient Hospital Stay: Payer: No Typology Code available for payment source | Admitting: Oncology

## 2019-05-10 ENCOUNTER — Ambulatory Visit
Admission: RE | Admit: 2019-05-10 | Discharge: 2019-05-10 | Disposition: A | Discharge status: Home / Self Care | Payer: Medicare Other | Source: Ambulatory Visit | Attending: Nurse Practitioner | Admitting: Nurse Practitioner

## 2019-05-10 DIAGNOSIS — I080 Rheumatic disorders of both mitral and aortic valves: Secondary | ICD-10-CM | POA: Insufficient documentation

## 2019-05-10 DIAGNOSIS — I313 Pericardial effusion (noninflammatory): Secondary | ICD-10-CM | POA: Diagnosis not present

## 2019-05-10 DIAGNOSIS — I3139 Other pericardial effusion (noninflammatory): Secondary | ICD-10-CM

## 2019-05-10 NOTE — Progress Notes (Signed)
Late entry: This RN asked to come to lobby screening as pt was downstairs at approximately 11:50am. Martin Majestic down to speak with pt and pt stated that he just wanted his PET results. 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 Pt clenched fists, looking visibly upset, stating "Look lady, I was in the hospital the first time I was scheduled for my PET scan. The second time I was in the hospital again. I finally got the scan, now I just want the results. What use are you if you won't give them to me?" I again explained to pt that he would have to speak with the MD regarding PET results, which is why he had bene scheduled for a virtual visit this morning. Pt became more frustrated, saying "I had to go for a heart ultrasound and it's kind of hard to take a deep breath and hold and talk on the phone at the same time, so why don't you just give me my results?" Again, reinforced that I was unable to do that, and told him that scheduling would be calling him to set an appointment up for him to go over his results, and asked if there was anything further that I could assist him with. Pt stated "Clearly not, I may as well be talking to a dead person. Why can't you just go get the doctor now?" Explained that MD was in meetings all afternoon, as today is his half clinic day. Pt then stated, "I came to this hospital so I wouldn't be treated like this, fat lot of good it did me. Just get me the hell out of here." Pt rolled to exit of [place] and helped into his ride's vehicle without incident. MD notified of interaction, scheduling asked to call pt to reschedule appt.

## 2019-05-10 NOTE — Progress Notes (Signed)
*  PRELIMINARY RESULTS* Echocardiogram 2D Echocardiogram has been performed.  Sherrie Sport 05/10/2019, 11:49 AM

## 2019-05-11 ENCOUNTER — Inpatient Hospital Stay (HOSPITAL_BASED_OUTPATIENT_CLINIC_OR_DEPARTMENT_OTHER): Payer: No Typology Code available for payment source | Admitting: Oncology

## 2019-05-11 ENCOUNTER — Other Ambulatory Visit: Payer: Self-pay

## 2019-05-11 ENCOUNTER — Ambulatory Visit (INDEPENDENT_AMBULATORY_CARE_PROVIDER_SITE_OTHER): Payer: Medicare Other | Admitting: Family

## 2019-05-11 ENCOUNTER — Encounter: Payer: Self-pay | Admitting: Family

## 2019-05-11 VITALS — BP 140/80 | HR 72 | Ht 68.0 in | Wt 147.8 lb

## 2019-05-11 DIAGNOSIS — Z7901 Long term (current) use of anticoagulants: Secondary | ICD-10-CM | POA: Diagnosis not present

## 2019-05-11 DIAGNOSIS — C3490 Malignant neoplasm of unspecified part of unspecified bronchus or lung: Secondary | ICD-10-CM | POA: Diagnosis not present

## 2019-05-11 DIAGNOSIS — I313 Pericardial effusion (noninflammatory): Secondary | ICD-10-CM | POA: Diagnosis not present

## 2019-05-11 DIAGNOSIS — I480 Paroxysmal atrial fibrillation: Secondary | ICD-10-CM | POA: Diagnosis not present

## 2019-05-11 DIAGNOSIS — C3491 Malignant neoplasm of unspecified part of right bronchus or lung: Secondary | ICD-10-CM | POA: Diagnosis not present

## 2019-05-11 DIAGNOSIS — I3139 Other pericardial effusion (noninflammatory): Secondary | ICD-10-CM

## 2019-05-11 DIAGNOSIS — Z79899 Other long term (current) drug therapy: Secondary | ICD-10-CM

## 2019-05-11 MED ORDER — MEGESTROL ACETATE 40 MG PO TABS: 40.0000 mg | ORAL_TABLET | Freq: Every day | ORAL | 1 refills | Status: AC

## 2019-05-11 NOTE — Patient Instructions (Signed)
Medication Instructions:  No medication changes today.  *If you need a refill on your cardiac medications before your next appointment, please call your pharmacy*  Lab Work: No labs ordered today.  Testing/Procedures: Your physician has requested that you have an echocardiogram in 1 month. Echocardiography is a painless test that uses sound waves to create images of your heart. It provides your doctor with information about the size and shape of your heart and how well your heart's chambers and valves are working. This procedure takes approximately one hour. There are no restrictions for this procedure.   Follow-Up: At [facility], you and your health needs are our priority.  As part of our continuing mission to provide you with exceptional heart care, we have created designated Provider Care Teams.  These Care Teams include your primary Cardiologist (physician) and Advanced Practice Providers (APPs -  Physician Assistants and Nurse Practitioners) who all work together to provide you with the care you need, when you need it.  Your next appointment:   1 month(s) in person with Carlos Barrera for follow up after echocardiogram  Other Instructions   Recommend trying Megace as prescribed by Dr. Grayland Ormond to try to help with appetite and keeping food down.

## 2019-05-11 NOTE — Progress Notes (Signed)
Office Visit    Patient Name: Carlos Barrera Date of Encounter: 05/11/2019  Primary Care Provider:  Clinic, [facility] Primary Cardiologist:  Ida Rogue, MD Electrophysiologist:  None   Chief Complaint    Carlos Barrera is a 79 y.o. male with a hx of  hx of atrial fibrillation, COPD, HTN, HLD, agent orange exposure, lung cancer, remote prostate cancer, hypothyroidism, remote tobacco abuse (40 pack years, quit 1982)   presents today for follow-up of echocardiogram and pericardial effusion  Past Medical History    Past Medical History:  Diagnosis Date  . Agent orange exposure   . Asthma    exercise induced asthma   . COPD (chronic obstructive pulmonary disease) ([place])   . DJD (degenerative joint disease)    a. 02/2019 s/p L TKA.  Marland Kitchen GERD (gastroesophageal reflux disease)   . Hearing loss    wears hearing aids  . History of cardiac cath    a. History of 2 cardiac catheterizations, last ~ 10 yrs ago @ [facility]-->reportedly nl.  . History of stress test    a. 2020 - pharmacologic stress testing @ [facility]-->reportedly nl.  . Hyperlipidemia   . Hypertension   . Hypothyroidism   . Insomnia   . Lung cancer ([place])    a. Dx 03/2019.  Marland Kitchen Pericardial effusion    a. 03/2019 Echo: EF 60-65%, no rwma, nl RV fxn. Mildly dil LA. Mod pericardia eff w/o tamponade. Mild Ao sclerosis.  . Prostate cancer ([place])   . PTSD (post-traumatic stress disorder)   . Seasonal allergies   . Thyroid disease    Past Surgical History:  Procedure Laterality Date  . BACK SURGERY     x 2  . COLONOSCOPY     hx polyps  . HERNIA REPAIR    . INCISE AND DRAIN ABCESS     patient denies this procedure 03/03/19  . NECK SURGERY     x 1  . PERICARDIOCENTESIS N/A 04/28/2019   Procedure: PERICARDIOCENTESIS;  Surgeon: Wellington Hampshire, MD;  Location: [facility];  Service: Cardiovascular;  Laterality: N/A;  . TONSILLECTOMY    . 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 TOTAL KNEE ARTHROPLASTY Left  03/09/2019   Procedure: LEFT TOTAL KNEE ARTHROPLASTY;  Surgeon: Meredith Pel, MD;  Location: [facility];  Service: Orthopedics;  Laterality: Left;  . WISDOM TOOTH EXTRACTION      Allergies  Allergies  Allergen Reactions  . Bee Venom Anaphylaxis  . Feldene [Piroxicam] Hives    History of Present Illness    Carlos Barrera is a 79 y.o. male with a hx of 79 y.o. male with a hx of atrial fibrillation, COPD, HTN, HLD, agent orange exposure, lung cancer, remote prostate cancer, hypothyroidism, remote tobacco abuse (40 pack years, quit 1982) last seen while hospitalized.   04/15/19 admitted for new onset atrial fib. Transitioned to amiodarone and Eliquis. Held off on beta blocker secondary to hypotension. Noted moderate pericardial effusion.    Readmitted 04/22/19-04/23/19 for chest pain and nausea. Troponin elevation thought to be due to demand ischemia.  Seen by me in the office 04/21/2019 after being seen in the [facility] and having an echocardiogram done and being told that he needed to present to the ED or seek his cardiologist urgently.  Echo from that day was reviewed by Dr. Saunders Revel in the office and suggestive of tamponade and he was sent to the ED.  He was admitted 04/27/2019-04/28/2010.  He did have  paroxysmal atrial fibrillation while in the hospital.  His amiodarone was resumed.  His pericardial effusion remained moderate in size, interventional cardiology attending.  Cardiocentesis however unsuccessful as effusion was not circumferential and was predominantly outside RV free wall could not be drained.  In the interim he has had PET scan for further evaluation of his lung cancer.  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  He met virtually his with his oncologist today with plans for PICC in urgent chemotherapy.   He had a repeat echocardiogram yesterday which showed stable moderate sized pericardial effusion.  No physiology  suggestive of tamponade.  Reports continued dysphagia and trouble keeping food down.  Reports decrease in appetite, decrease in taste.  Reports lots of cough with phlegm and prohibits him from getting as much the would like.  Tells me if he takes a sip of water it "bounces right back".  Reports this has been going on longer than 3 weeks.  He discussed with her oncologist with wanting his prescribed Megace.  We discussed that amiodarone sometimes can contain due to nausea vomiting, early satiety.  He is hesitant to stop today but tells me he will consider.  Reports no chest pain, pressure, tightness. Denies bleeding complications.   Reports continued dyspnea on exertion and weakness.  Does report that his breathing is somewhat better when he is in sinus rhythm rather than atrial fibrillation.  We discussed that the DOE is likely multifactorial.   EKGs/Labs/Other Studies Reviewed:   The following studies were reviewed today: Echo 05/10/2019 1. Left ventricular ejection fraction, by visual estimation, is 55 to 60%. The left ventricle has normal function.  2. Left ventricular diastolic parameters are consistent with Grade II diastolic dysfunction (pseudonormalization).  3. The left ventricle has no regional wall motion abnormalities.  4. Global right ventricle has normal systolc function.The right ventricular size is normal.  5. Moderate pericardial effusion.  6. The pericardial effusion is anterior to the right ventricle, localized near the right ventricle and surrounding the apex.  7. Mild mitral annular calcification.  8. The mitral valve is degenerative.  9. The tricuspid valve was grossly normal. 10. Mild aortic valve sclerosis without stenosis. 11. The inferior vena cava is normal in size with greater than 50% respiratory variability, suggesting right atrial pressure of 3 mmHg.  EKG:  EKG is ordered today.  The ekg ordered today demonstrates SR 72 bpm - no acute ST/T wave changes  Recent  Labs: 04/20/2019: Magnesium 1.7 04/22/2019: ALT 17 04/23/2019: TSH 37.147 04/28/2019: BUN 15; Creatinine, Ser 0.90; Hemoglobin 10.3; Platelets 269; Potassium 3.4; Sodium 138  Recent Lipid Panel No results found for: CHOL, TRIG, HDL, CHOLHDL, VLDL, LDLCALC, LDLDIRECT  Home Medications   Current Meds  Medication Sig  . alum & mag hydroxide-simeth (MAALOX/MYLANTA) 200-200-20 MG/5ML suspension Take 30 mLs by mouth every 6 (six) hours as needed for indigestion or heartburn.  Marland Kitchen amiodarone (PACERONE) 200 MG tablet Take 1 tablet (200 mg total) by mouth 2 (two) times daily.  Marland Kitchen apixaban (ELIQUIS) 5 MG TABS tablet Take 1 tablet (5 mg total) by mouth 2 (two) times daily.  . cetirizine (ZYRTEC) 10 MG tablet Take 10 mg by mouth daily.   . folic acid (FOLVITE) 1 MG tablet Take 1 mg by mouth daily.  Marland Kitchen levothyroxine (SYNTHROID) 88 MCG tablet Take 88 mcg by mouth daily before breakfast.  . megestrol (MEGACE) 40 MG tablet Take 1 tablet (40 mg total) by mouth  daily.  . ondansetron (ZOFRAN ODT) 4 MG disintegrating tablet Take 1 tablet (4 mg total) by mouth every 8 (eight) hours as needed for nausea or vomiting.  . polyethylene glycol (MIRALAX / GLYCOLAX) 17 g packet Take 17 g by mouth daily.  . pravastatin (PRAVACHOL) 20 MG tablet Take 20 mg by mouth at bedtime.  . prazosin (MINIPRESS) 5 MG capsule Take 10 mg by mouth at bedtime.   Marland Kitchen REFRESH TEARS 0.5 % SOLN Place 1 drop into both eyes 2 (two) times daily.   . sertraline (ZOLOFT) 50 MG tablet Take 50 mg by mouth at bedtime.   . tamsulosin (FLOMAX) 0.4 MG CAPS capsule Take 0.4 mg by mouth daily.   . traZODone (DESYREL) 50 MG tablet Take 50 mg by mouth at bedtime.  . [DISCONTINUED] amiodarone (PACERONE) 400 MG tablet Take 1 tablet (400 mg total) by mouth 2 (two) times daily.   Current Facility-Administered Medications for the 05/11/19 encounter (Office Visit) with Loel Dubonnet, NP  Medication  . Leuprolide Acetate (6 Month) (LUPRON) injection 45 mg      Review of Systems    Review of Systems  Constitution: Positive for decreased appetite, malaise/fatigue and weight loss. Negative for chills and fever.  Cardiovascular: Positive for dyspnea on exertion. Negative for chest pain, irregular heartbeat, leg swelling, near-syncope, orthopnea, palpitations and syncope.  Respiratory: Positive for cough. Negative for shortness of breath and wheezing.   Gastrointestinal: Negative for melena, nausea and vomiting.  Genitourinary: Negative for hematuria.  Neurological: Positive for weakness. Negative for dizziness and light-headedness.   All other systems reviewed and are otherwise negative except as noted above.  Physical Exam    VS:  BP 140/80 (BP Location: Left Arm, Patient Position: Sitting, Cuff Size: Normal)   Pulse 72   Ht '5\' 8"'  (1.727 m)   Wt 147 lb 12 oz (67 kg)   SpO2 97%   BMI 22.47 kg/m  , BMI Body mass index is 22.47 kg/m. GEN: Thin, frail appearing, well developed, in no acute distress. HEENT: normal. Neck: Supple, no JVD, carotid bruits, or masses. Cardiac: RRR, no murmurs, rubs, or gallops. No clubbing, cyanosis, edema.  Radials/PT 2+ and equal bilaterally.  Respiratory:  Respirations regular and unlabored. Diminished bases bilaterally. GI: Soft, nontender, nondistended, BS + x 4. MS: No deformity or atrophy. Skin: Warm and dry, no rash. Neuro:  Strength and sensation are intact. Psych: Flat affect.  Assessment & Plan    1. Pericardial effusion - Echo 05/10/19 with stable moderate pericardial effusion.  This was unable to be drained via pericardiocentesis during recent hospitalization secondary to its placement behind the right ventricle.  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  If he is worsening DOE, shortness of breath could consider referral to CT surgery for consideration of pericardial window.  However, at this time we will defer as his dyspnea is at his baseline.  We will repeat  echocardiogram in 1 month.   2. PAF - Currently maintained on amiodarone 200 mg daily and Eliquis 5 mg twice daily.  His atrial fibrillation is symptomatic with shortness of breath and fatigue.  Management of immunotherapy, as below.  3. On amiodarone therapy - Secondary to PAF.  TSH, as below.  Recent liver function normal.  We discussed the day that some of his eating difficulties and lack of appetite could be a side effect of Amiodarone.  He is very hesitant to discontinue as he is symptomatic when in atrial  fibrillation.  He tells me he would like to try the Megace prescribed by oncology this morning and then consider discontinuation.  Will forward my note oncology.  If amiodarone is discontinued would recommend starting metoprolol heart rate 25 mg twice daily.  4. Long term current use of anticoagulation - Secondary to PAF. Denies bleeding complications.  Continue Eliquis 5 mg twice daily.  5. [facility]othy[facility] - [facility] Most recent TSH 37.147 on 04/23/19. Will need careful monitoring in setting of Amiodarone therapy.   6. Metastatic lung cancer - Recent PET with primary lung cancer with metastasis to lymph nodes, right adrenal bones. Visit with oncology today and plan for PICC placement and chemotherapy.   7. Dysphagia - GERD vs amiodarone side effect vs alternate etiology. Started on Megace by oncology today. Amiodarone, as above. He was on omeprazole in the hospital and may benefit from PPI. May benefit from outpatient speech evaluation.   Disposition: Echocardiogram in 1 month for follow-up of pericardial effusion.  Follow up in 2 month(s) with Dr.Gollan  Loel Dubonnet, NP 05/11/2019, 9:25 PM

## 2019-05-12 ENCOUNTER — Telehealth: Payer: Self-pay | Admitting: Family

## 2019-05-12 MED ORDER — FOLIC ACID 1 MG PO TABS: 1.0000 mg | ORAL_TABLET | Freq: Every day | ORAL | 3 refills | Status: AC

## 2019-05-12 MED ORDER — LIDOCAINE-PRILOCAINE 2.5-2.5 % EX CREA: TOPICAL_CREAM | CUTANEOUS | 3 refills | Status: AC

## 2019-05-12 MED ORDER — ONDANSETRON HCL 8 MG PO TABS: 8.0000 mg | ORAL_TABLET | Freq: Two times a day (BID) | ORAL | 2 refills | Status: AC | PRN

## 2019-05-12 MED ORDER — PROCHLORPERAZINE MALEATE 10 MG PO TABS: 10.0000 mg | ORAL_TABLET | Freq: Four times a day (QID) | ORAL | 2 refills | Status: AC | PRN

## 2019-05-12 NOTE — Telephone Encounter (Addendum)
Update fwd to Laurann Montana, NP

## 2019-05-12 NOTE — Progress Notes (Signed)
START ON PATHWAY REGIMEN - Non-Small Cell Lung     A cycle is every 21 days:     Pemetrexed      Carboplatin      Bevacizumab-xxxx   **Always confirm dose/schedule in your pharmacy ordering system**  Patient Characteristics: Stage IV Metastatic, Nonsquamous, Initial Chemotherapy/Immunotherapy, PS = 0, 1, ALK or EGFR or ROS1 or NTRK or MET or RET Genomic Alterations - Awaiting Test Results AJCC T Category: TX Current Disease Status: Distant Metastases AJCC N Category: NX AJCC M Category: M1c AJCC 8 Stage Grouping: IVB Histology: Nonsquamous Cell ROS1 Rearrangement Status: Quantity Not Sufficient T790M Mutation Status: Not Applicable - EGFR Mutation Negative/Unknown Other Mutations/Biomarkers: No Other Actionable Mutations Chemotherapy/Immunotherapy LOT: Initial Chemotherapy/Immunotherapy Molecular Targeted Therapy: Not Appropriate MET Exon 14 Mutation Status: Quantity Not Sufficient RET Gene Fusion Status: Quantity Not Sufficient EGFR Mutation Status: Quantity Not Sufficient NTRK Gene Fusion Status: Quantity Not Sufficient PD-L1 Expression Status: Awaiting Test Results ALK Rearrangement Status: Quantity Not Sufficient BRAF V600E Mutation Status: Quantity Not Sufficient ECOG Performance Status: 1 Biomarker Assessment Status Confirmation: Awaiting genomic biomarker test(s) results and need to start chemotherapy Intent of Therapy: Non-Curative / Palliative Intent, Discussed with Patient

## 2019-05-12 NOTE — Telephone Encounter (Signed)
[facility] Center calling to let office know that patient misspoke in previous appointment regarding lab work. At patients appointment today it was realized patient is not having lab work done with any provider. If any additional lab work is needed, it will need to be order and scheduled  Please advise

## 2019-05-12 NOTE — Telephone Encounter (Signed)
No needs for labs at this time. Appreciate them making [place] aware.  Loel Dubonnet, NP

## 2019-05-12 NOTE — Progress Notes (Signed)
[place]  [phone] [phone]  ID: Carlos Barrera OB: 1941-04-18  MR#: [id number]  NKN#:[id number]  Patient Care Team: [facility] as PCP - General Rockey Situ Kathlene November, MD as PCP - Cardiology (Cardiology)  I connected with Carlos Barrera on 05/12/19 at 10:00 AM EST by video enabled telemedicine visit and verified that I am speaking with the correct person using two identifiers.   I discussed the limitations, risks, security and privacy concerns of performing an evaluation and management service by telemedicine and the availability of in-person appointments. I also discussed with the patient that there may be a patient responsible charge related to this service. The patient expressed understanding and agreed to proceed.   Other persons participating in the visit and their role in the encounter: Patient, MD.  Patient's location: Home. Provider's location: Clinic.  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  He continues to have a poor appetite associated with increased weakness and fatigue, but otherwise feels well.  He has no neurologic complaints.  He denies any recent fevers.  He denies any chest pain, shortness of breath, cough, or hemoptysis.  He has no nausea, vomiting, constipation, or diarrhea.  He has no urinary complaints.  Patient offers no further specific complaints today.  REVIEW OF SYSTEMS:   Review of Systems  Constitutional: Positive for malaise/fatigue and weight loss. Negative for fever.  Respiratory: Negative.  Negative for cough and shortness of breath.   Cardiovascular: Negative.  Negative for chest pain and leg swelling.  Gastrointestinal: Negative.  Negative for abdominal pain.  Genitourinary: Negative.  Negative for dysuria.    Musculoskeletal: Negative.  Negative for back pain.  Skin: Negative.  Negative for rash.  Neurological: Positive for weakness. Negative for dizziness and headaches.  Psychiatric/Behavioral: Negative.  The patient is not nervous/anxious.     As per HPI. Otherwise, a complete review of systems is negative.  PAST MEDICAL HISTORY: Past Medical History:  Diagnosis Date  . Agent orange exposure   . Asthma    exercise induced asthma   . COPD (chronic obstructive pulmonary disease) ([place])   . DJD (degenerative joint disease)    a. 02/2019 s/p L TKA.  Marland Kitchen GERD (gastroesophageal reflux disease)   . Hearing loss    wears hearing aids  . History of cardiac cath    a. History of 2 cardiac catheterizations, last ~ 10 yrs ago @ [facility]-->reportedly nl.  . History of stress test    a. 2020 - pharmacologic stress testing @ [facility]-->reportedly nl.  . Hyperlipidemia   . Hypertension   . Hypothyroidism   . Insomnia   . Lung cancer ([place])    a. Dx 03/2019.  Marland Kitchen Pericardial effusion    a. 03/2019 Echo: EF 60-65%, no rwma, nl RV fxn. Mildly dil LA. Mod pericardia eff w/o tamponade. Mild Ao sclerosis.  . Prostate cancer ([place])   . PTSD (post-traumatic stress disorder)   . Seasonal allergies   . Thyroid disease     PAST SURGICAL HISTORY: Past Surgical History:  Procedure Laterality Date  . BACK SURGERY     x 2  . COLONOSCOPY     hx polyps  . HERNIA REPAIR    . INCISE AND DRAIN ABCESS     patient denies this procedure 03/03/19  . NECK SURGERY     x 1  .  PERICARDIOCENTESIS N/A 04/28/2019   Procedure: PERICARDIOCENTESIS;  Surgeon: Wellington Hampshire, MD;  Location: [facility];  Service: Cardiovascular;  Laterality: N/A;  . TONSILLECTOMY    . 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 TOTAL KNEE ARTHROPLASTY Left 03/09/2019   Procedure: LEFT TOTAL KNEE ARTHROPLASTY;  Surgeon: Meredith Pel, MD;  Location: [facility];  Service: Orthopedics;  Laterality: Left;  . WISDOM TOOTH EXTRACTION       FAMILY HISTORY: Family History  Problem Relation Age of Onset  . Breast cancer Mother        w/ brain mets -> died in her 51's.  Marland Kitchen Heart attack Father        died @ 100  . Other Brother        multiple medical issues, pt unaware of specifics.    ADVANCED DIRECTIVES (Y/N):  N  HEALTH MAINTENANCE: Social History   Tobacco Use  . Smoking status: Former Smoker    Packs/day: 2.00    Years: 20.00    Pack years: 40.00    Types: Cigarettes    Quit date: 04/20/1980    Years since quitting: 39.0  . Smokeless tobacco: Never Used  Substance Use Topics  . Alcohol use: No  . Drug use: No     Colonoscopy:  PAP:  Bone density:  Lipid panel:  Allergies  Allergen Reactions  . Bee Venom Anaphylaxis  . Feldene [Piroxicam] Hives    Current Outpatient Medications  Medication Sig Dispense Refill  . alum & mag hydroxide-simeth (MAALOX/MYLANTA) 200-200-20 MG/5ML suspension Take 30 mLs by mouth every 6 (six) hours as needed for indigestion or heartburn. 355 mL 0  . amiodarone (PACERONE) 200 MG tablet Take 1 tablet (200 mg total) by mouth 2 (two) times daily. 60 tablet 0  . apixaban (ELIQUIS) 5 MG TABS tablet Take 1 tablet (5 mg total) by mouth 2 (two) times daily. 60 tablet 0  . cetirizine (ZYRTEC) 10 MG tablet Take 10 mg by mouth daily.     . folic acid (FOLVITE) 1 MG tablet Take 1 mg by mouth daily.    Marland Kitchen levothyroxine (SYNTHROID) 88 MCG tablet Take 88 mcg by mouth daily before breakfast.    . megestrol (MEGACE) 40 MG tablet Take 1 tablet (40 mg total) by mouth daily. 30 tablet 1  . ondansetron (ZOFRAN ODT) 4 MG disintegrating tablet Take 1 tablet (4 mg total) by mouth every 8 (eight) hours as needed for nausea or vomiting. 20 tablet 0  . polyethylene glycol (MIRALAX / GLYCOLAX) 17 g packet Take 17 g by mouth daily. 14 each 0  . pravastatin (PRAVACHOL) 20 MG tablet Take 20 mg by mouth at bedtime.    . prazosin (MINIPRESS) 5 MG capsule Take 10 mg by mouth at bedtime.     Marland Kitchen REFRESH  TEARS 0.5 % SOLN Place 1 drop into both eyes 2 (two) times daily.     . sertraline (ZOLOFT) 50 MG tablet Take 50 mg by mouth at bedtime.     . tamsulosin (FLOMAX) 0.4 MG CAPS capsule Take 0.4 mg by mouth daily.     . traZODone (DESYREL) 50 MG tablet Take 50 mg by mouth at bedtime.     Current Facility-Administered Medications  Medication Dose Route Frequency Provider Last Rate Last Admin  . Leuprolide Acetate (6 Month) (LUPRON) injection 45 mg  45 mg Intramuscular Once Stoioff, Ronda Fairly, MD        OBJECTIVE: There were no vitals filed for this  visit.   There is no height or weight on file to calculate BMI.    ECOG FS:1 - Symptomatic but completely ambulatory  General: Thin, no acute distress. HEENT: Normocephalic. Neuro: Alert, answering all questions appropriately. Cranial nerves grossly intact. Psych: Normal affect.  LAB RESULTS:  Lab Results  Component Value Date   NA 138 04/28/2019   K 3.4 (L) 04/28/2019   CL 105 04/28/2019   CO2 23 04/28/2019   GLUCOSE 111 (H) 04/28/2019   BUN 15 04/28/2019   CREATININE 0.90 04/28/2019   CALCIUM 9.7 04/28/2019   PROT 7.1 04/22/2019   ALBUMIN 3.4 (L) 04/22/2019   AST 21 04/22/2019   ALT 17 04/22/2019   ALKPHOS 99 04/22/2019   BILITOT 0.5 04/22/2019   GFRNONAA >60 04/28/2019   GFRAA >60 04/28/2019    Lab Results  Component Value Date   WBC 4.2 04/28/2019   NEUTROABS 5.7 04/15/2019   HGB 10.3 (L) 04/28/2019   HCT 32.3 (L) 04/28/2019   MCV 93.6 04/28/2019   PLT 269 04/28/2019     STUDIES: DG Chest 2 View  Result Date: 04/27/2019 CLINICAL DATA:  Chest pressure shortness of breath for 1 week, recently diagnosed with lung cancer, getting PET scan tomorrow, former smoker, hypertension EXAM: CHEST - 2 VIEW COMPARISON:  04/17/2019 FINDINGS: Normal heart size, mediastinal contours, and pulmonary vascularity. Small RIGHT pleural effusion and basilar atelectasis. Remaining lungs clear. No LEFT pleural effusion, or evidence of pneumothorax.  Bones demineralized with prior cervicothoracic fusion. IMPRESSION: Persistent small RIGHT pleural effusion and basilar atelectasis. Electronically Signed   By: Lavonia Dana M.D.   On: 04/27/2019 17:19   CT Angio Chest PE W and/or Wo Contrast  Result Date: 04/16/2019 CLINICAL DATA:  Shortness of breath EXAM: CT ANGIOGRAPHY CHEST WITH CONTRAST TECHNIQUE: Multidetector CT imaging of the chest was performed using the standard protocol during bolus administration of intravenous contrast. Multiplanar CT image reconstructions and MIPs were obtained to evaluate the vascular anatomy. CONTRAST:  163mL OMNIPAQUE IOHEXOL 350 MG/ML SOLN COMPARISON:  None. FINDINGS: Cardiovascular: There is a optimal opacification of the pulmonary arteries. There is no central,segmental, or subsegmental filling defects within the pulmonary arteries. There is mild cardiomegaly. A small to moderate pericardial effusion is present. Mild scattered aortic atherosclerosis is seen. There is normal three-vessel brachiocephalic anatomy. Mediastinum/Nodes: Subcarinal adenopathy is seen measuring 2.2 cm in AP dimension. There is also a right hilar adenopathy which measures 1.5 cm in AP dimension. Thyroid gland, trachea, and esophagus demonstrate no significant findings. Lungs/Pleura: There is small peripheral patchy airspace opacity seen within the posterior right upper and lower lung. There is also tree-in-bud opacity seen in the periphery of the right lung base. There is a small right and trace left pleural effusion. Mildly increased interstitial thickening seen within both upper lungs. Upper Abdomen: No acute abnormalities present in the visualized portions of the upper abdomen. Musculoskeletal: No chest wall abnormality. No acute or significant osseous findings. Review of the MIP images confirms the above findings. IMPRESSION: No central, segmental, or subsegmental pulmonary embolism. Small to moderate pericardial effusion Subcarinal and right hilar  adenopathy which could be reactive. Patchy/ground-glass opacity in the posterior right lung which could be due to atelectasis and/or infectious etiology. Small right and trace left pleural effusion. Mildly increased interstitial thickening seen within both upper lungs likely due to mild interstitial edema. Aortic Atherosclerosis (ICD10-I70.0). Electronically Signed   By: Prudencio Pair M.D.   On: 04/16/2019 00:04   CT abd  Result Date: 04/22/2019  CLINICAL DATA:  Emesis since yesterday. New onset of atrial fibrillation. On blood thinners. Sensation of food stuck in esophagus. Lung and prostate cancer. EXAM: CT ABDOMEN AND PELVIS WITH CONTRAST TECHNIQUE: Multidetector CT imaging of the abdomen and pelvis was performed using the standard protocol following bolus administration of intravenous contrast. CONTRAST:  148mL OMNIPAQUE IOHEXOL 300 MG/ML  SOLN COMPARISON:  Plain film of the abdomen of earlier today. FINDINGS: Lower chest: Mild right base subsegmental atelectasis. Normal heart size with minimal pericardial fluid. Small right pleural effusion. Hepatobiliary: Normal liver. Normal gallbladder, without biliary ductal dilatation. Pancreas: Normal, without mass or ductal dilatation. Spleen: Normal in size, without focal abnormality. Adrenals/Urinary Tract: Normal left adrenal gland. Felt to arise exophytic off the right adrenal is a 2.1 x 1.5 cm nodule including on 29/2. When compared to the CT of the chest of 04/15/2019, this is low-density on that study, favoring an adenoma. Normal kidneys, without hydronephrosis.  Normal urinary bladder. Stomach/Bowel: Normal stomach, without wall thickening. Colonic stool burden suggests constipation. Normal terminal ileum and appendix. Normal small bowel. Vascular/Lymphatic: Aortic and branch vessel atherosclerosis. No abdominopelvic adenopathy. Reproductive: Normal prostate. Other: No significant free fluid. Small bilateral fat containing inguinal hernias. Musculoskeletal:  Lumbar spondylosis. Prior interbody fusion material at L4-5. IMPRESSION: 1. No acute process in the abdomen or pelvis. 2. Possible constipation. 3. Small right pleural effusion. 4. Right adrenal adenoma. 5. Aortic Atherosclerosis (ICD10-I70.0). 6. Small pericardial effusion. Electronically Signed   By: Abigail Miyamoto M.D.   On: 04/22/2019 16:50   CARDIAC CATHETERIZATION  Result Date: 04/28/2019 And successful pericardiocentesis due to inability to reach the fluid from the subxiphoid area.  The amount of effusion on echo did not appear large enough to be tapped from this approach. Recommendations: We will obtain a repeat limited echocardiogram.  If there is significant posterior and apical fluid, a pericardial window might be the best option.  NM PET Image Initial (PI) Skull Base To Thigh  Result Date: 05/09/2019 CLINICAL DATA:  Initial treatment strategy for lung cancer. EXAM: NUCLEAR MEDICINE PET SKULL BASE TO THIGH TECHNIQUE: 7.86 mCi F-18 FDG was injected intravenously. Full-ring PET imaging was performed from the skull base to thigh after the radiotracer. CT data was obtained and used for attenuation correction and anatomic localization. Fasting blood glucose: 103 mg/dl COMPARISON:  CTs of 04/22/2019 and 04/15/2019 FINDINGS: Mediastinal blood pool activity: SUV max 3.5 Liver activity: SUV max 4.3 NECK: Right level V B lymph node with increased FDG uptake (SUVmax = 4.4) this measures 6 mm. Signs of skeletal metastases in the cervical spine and thoracic inlet adenopathy, see below. Incidental CT findings: none CHEST: Diffuse hypermetabolic nodal enlargement throughout the chest involving supraclavicular nodes, superior mediastinal nodes, subcarinal nodes and right hilar nodal tissue. Small lymph nodes along the aorta in the left chest also show hypermetabolic activity. (Image 81, series 4) approximately 4.2 x 3.8 cm subcarinal nodal mass nearly contiguous with adjacent mediastinal nodal tissue (SUVmax = 25.4  similar FDG uptake within AP window lymph nodes. Soft tissue inseparable from adjacent bronchial structures. (image 67, series 4) 1.5 cm, short axis right paratracheal lymph node with intense metabolic activity (SUVmax = 20.1) ( (Image 60, series 4) high left superior mediastinal lymph node measuring 1.2 cm (SUVmax = 12.6) Incidental CT findings: Extensive soft tissue encasing central bronchial structures and displacing the esophagus. The esophagus above the mass is patulous suggesting mass effect or direct invasion or obstruction of the esophagus due to mediastinal tumor. Calcified coronary artery disease.  Generalized aortic atherosclerosis. Small right-sided effusion larger than on the study of 04/15/2019. Signs of interstitial thickening at the right lung base. Left chest is clear. ABDOMEN/PELVIS: Intense hypermetabolic activity associated with right adrenal lesion. (Image 119, series 4) 2.3 x 1.5 cm (SUVmax = 17.7) no definite sign of hepatic, splenic, pancreatic or left adrenal activity. Handling of FDG by the kidneys is symmetric. Small right retroperitoneal lymph node (image 131, series 4) (SUVmax = 5.0) Incidental CT findings: Aortic atherosclerosis. No signs of aneurysm. SKELETON: Multifocal osseous metastases. Signs of spinal fusion in the cervical spine. Hypermetabolic lesion in the spinous process of C5. Also involvement of the lateral mass of C2 along with the lamina and spinous process. Left T3 lamina extending in the spinous process with metastatic focus. Left parasternal metastasis in the sternal manubrium with 2 metastatic foci in the sternum. Lesions involving T7 vertebral body on the right, transverse process of T8 on the left, T12 vertebral body along the posterior cortex, L1 vertebral body along the right lateral aspect, L3 along the left lateral margin of the vertebral body, L4 in the transverse process, pedicle and spinous process, L5 in the transverse process and right lateral vertebral body  at L5-S1. Lesion in the right posterior iliac bone, left iliac crest and right proximal femur as well. These lesions all showing a similar degree of FDG uptake. For example, the lesion at the T12 vertebral body (image 110, series 4 (SUVmax = 24.5) this lesion is lytic with vertebral destruction measuring 1.7 cm. Lesion the right L4 transverse process and pedicle (SUVmax = 16.1) Incidental CT findings: Spinal degenerative changes with evidence of spinal fusion. No signs of visible pathologic fracture. IMPRESSION: 1. 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 Electronically Signed   By: Zetta Bills M.D.   On: 05/09/2019 13:42   DG CHEST PORT 1 VIEW  Result Date: 04/17/2019 CLINICAL DATA:  Atrial fibrillation and shortness of breath. Evaluate for pulmonary infiltrates. EXAM: PORTABLE CHEST 1 VIEW COMPARISON:  04/16/2019 FINDINGS: Heart size appears within normal limits. New small right pleural effusion identified. Mild asymmetric edema identified within the right mid and right lower lung. Left lung is clear. IMPRESSION: 1. Small right effusion with mild asymmetric edema in the right mid and right lower lung. Electronically Signed   By: Kerby Moors M.D.   On: 04/17/2019 09:27   DG Chest Portable 1 View  Result Date: 04/16/2019 CLINICAL DATA:  Shortness of breath EXAM: PORTABLE CHEST 1 VIEW COMPARISON:  None. FINDINGS: The heart size and mediastinal contours are within normal limits. There is mildly increased interstitial markings seen throughout both lungs. There is patchy airspace opacity at the periphery of the right lung base. The visualized skeletal structures are unremarkable. IMPRESSION: Mildly increased interstitial markings throughout both lungs with patchy airspace opacity in the right lung base. This could be due to asymmetric edema and/or infectious etiology. Electronically Signed   By: Prudencio Pair M.D.   On: 04/16/2019 00:39     DG Abd 2 Views  Result Date: 04/22/2019 CLINICAL DATA:  Abdominal pain EXAM: ABDOMEN - 2 VIEW COMPARISON:  None. FINDINGS: The bowel gas pattern is normal. Moderate volume of stool throughout the colon. There is no evidence of free air. No radio-opaque calculi or other significant radiographic abnormality is seen. IMPRESSION: Nonobstructive bowel gas pattern with moderate volume of stool throughout the colon. Electronically Signed   By: Davina Poke D.O.   On: 04/22/2019 13:30  ECHOCARDIOGRAM COMPLETE  Result Date: 04/16/2019   ECHOCARDIOGRAM REPORT   Patient Name:   JAKHARI SPACE Date of Exam: 04/16/2019 Medical Rec #:  [id number]       Height:       68.0 in Accession #:    [id number]      Weight:       154.8 lb Date of Birth:  January 31, 1941       BSA:          1.83 m Patient Age:    48 years        BP:           105/54 mmHg Patient Gender: M               HR:           74 bpm. Exam Location:  Inpatient Procedure: 2D Echo, Color Doppler and Cardiac Doppler Indications:    Atrial fibrillation  History:        Patient has no prior history of Echocardiogram examinations.                 Lung and prostate cancer.  Sonographer:    Merrie Roof RDCS Referring Phys: [place]  1. Left ventricular ejection fraction, by visual estimation, is 60 to 65%. The left ventricle has normal function. There is no left ventricular hypertrophy.  2. The left ventricle has no regional wall motion abnormalities.  3. Global right ventricle has normal systolic function.The right ventricular size is normal. No increase in right ventricular wall thickness.  4. Left atrial size was mildly dilated.  5. Right atrial size was normal.  6. Moderate pericardial effusion.  7. The pericardial effusion is anterior to the right ventricle.  8. Mild mitral annular calcification.  9. The mitral valve is normal in structure. No evidence of mitral valve regurgitation. No evidence of mitral stenosis. 10. The  tricuspid valve is normal in structure. 11. The aortic valve is normal in structure. Aortic valve regurgitation is not visualized. Mild aortic valve sclerosis without stenosis. 12. The pulmonic valve was normal in structure. Pulmonic valve regurgitation is not visualized. 13. The inferior vena cava is normal in size with greater than 50% respiratory variability, suggesting right atrial pressure of 3 mmHg. 14. No prior Echocardiogram. FINDINGS  Left Ventricle: Left ventricular ejection fraction, by visual estimation, is 60 to 65%. The left ventricle has normal function. The left ventricle has no regional wall motion abnormalities. There is no left ventricular hypertrophy. Left ventricular diastolic parameters were normal. Normal left atrial pressure. Right Ventricle: The right ventricular size is normal. No increase in right ventricular wall thickness. Global RV systolic function is has normal systolic function. Left Atrium: Left atrial size was mildly dilated. Right Atrium: Right atrial size was normal in size Pericardium: A moderately sized pericardial effusion is present. The pericardial effusion is anterior to the right ventricle. There is no evidence of cardiac tamponade. Mitral Valve: The mitral valve is normal in structure. Mild mitral annular calcification. No evidence of mitral valve regurgitation. No evidence of mitral valve stenosis by observation. Tricuspid Valve: The tricuspid valve is normal in structure. Tricuspid valve regurgitation is not demonstrated. Aortic Valve: The aortic valve is normal in structure. Aortic valve regurgitation is not visualized. Mild aortic valve sclerosis is present, with no evidence of aortic valve stenosis. Pulmonic Valve: The pulmonic valve was normal in structure. Pulmonic valve regurgitation is not visualized. Pulmonic regurgitation is not visualized. Aorta: The aortic  root, ascending aorta and aortic arch are all structurally normal, with no evidence of dilitation or  obstruction. Venous: The inferior vena cava was not well visualized. The inferior vena cava is normal in size with greater than 50% respiratory variability, suggesting right atrial pressure of 3 mmHg. IAS/Shunts: No atrial level shunt detected by color flow Doppler. There is no evidence of a patent foramen ovale. No ventricular septal defect is seen or detected. There is no evidence of an atrial septal defect.  LEFT VENTRICLE PLAX 2D LVIDd:         4.35 cm       Diastology LVIDs:         2.82 cm       LV e' lateral:   7.83 cm/s LV PW:         1.13 cm       LV E/e' lateral: 8.7 LV IVS:        1.04 cm       LV e' medial:    8.38 cm/s LVOT diam:     1.90 cm       LV E/e' medial:  8.1 LV SV:         55 ml LV SV Index:   30.05 LVOT Area:     2.84 cm  LV Volumes (MOD) LV area d, A4C:    24.20 cm LV area s, A4C:    12.20 cm LV major d, A4C:   7.25 cm LV major s, A4C:   5.94 cm LV vol d, MOD A4C: 66.1 ml LV vol s, MOD A4C: 20.9 ml LV SV MOD A4C:     66.1 ml RIGHT VENTRICLE RV Basal diam:  3.54 cm LEFT ATRIUM             Index       RIGHT ATRIUM           Index LA diam:        4.20 cm 2.29 cm/m  RA Area:     12.80 cm LA Vol (A2C):   42.1 ml 22.97 ml/m RA Volume:   27.60 ml  15.06 ml/m LA Vol (A4C):   51.6 ml 28.15 ml/m LA Biplane Vol: 50.0 ml 27.28 ml/m  AORTIC VALVE LVOT Vmax:   71.10 cm/s LVOT Vmean:  47.800 cm/s LVOT VTI:    0.138 m  AORTA Ao Root diam: 3.60 cm Ao Asc diam:  3.60 cm MITRAL VALVE MV Area (PHT): 4.29 cm             SHUNTS MV PHT:        51.33 msec           Systemic VTI:  0.14 m MV Decel Time: 177 msec             Systemic Diam: 1.90 cm MV E velocity: 68.10 cm/s 103 cm/s MV A velocity: 56.10 cm/s 70.3 cm/s MV E/A ratio:  1.21       1.5  Mihai Croitoru MD Electronically signed by Sanda Klein MD Signature Date/Time: 04/16/2019/5:15:08 PM    Final    ECHOCARDIOGRAM LIMITED  Result Date: 05/10/2019   ECHOCARDIOGRAM LIMITED REPORT   Patient Name:   DEIONTAE RABEL Date of Exam: 05/10/2019 Medical  Rec #:  [id number]       Height:       68.0 in Accession #:    [id number]      Weight:       158.0 lb Date of Birth:  06-26-1940       BSA:          1.85 m Patient Age:    11 years        BP:           112/76 mmHg Patient Gender: M               HR:           74 bpm. Exam Location:  [facility]  Procedure: Limited Echo, Color Doppler and Cardiac Doppler Indications:     Pericardial Effusion 423.9  History:         Patient has prior history of Echocardiogram examinations.                  Pericardial Effusion.  Sonographer:     Sherrie Sport RDCS (AE) Referring Phys:  [place] Diagnosing Phys: Kate Sable MD IMPRESSIONS  1. Left ventricular ejection fraction, by visual estimation, is 55 to 60%. The left ventricle has normal function.  2. Left ventricular diastolic parameters are consistent with Grade II diastolic dysfunction (pseudonormalization).  3. The left ventricle has no regional wall motion abnormalities.  4. Global right ventricle has normal systolc function.The right ventricular size is normal.  5. Moderate pericardial effusion.  6. The pericardial effusion is anterior to the right ventricle, localized near the right ventricle and surrounding the apex.  7. Mild mitral annular calcification.  8. The mitral valve is degenerative.  9. The tricuspid valve was grossly normal. 10. Mild aortic valve sclerosis without stenosis. 11. The inferior vena cava is normal in size with greater than 50% respiratory variability, suggesting right atrial pressure of 3 mmHg. FINDINGS  Left Ventricle: Left ventricular ejection fraction, by visual estimation, is 55 to 60%. The left ventricle has normal function. The left ventricle has no regional wall motion abnormalities. Left ventricular diastolic parameters are consistent with Grade  II diastolic dysfunction (pseudonormalization). Right Ventricle: The right ventricular size is normal. Global RV systolic function is has normal systolic function. Left Atrium: Left  atrial size was normal in size. Right Atrium: Right atrial size was normal in size. Right atrial pressure is estimated at 3 mmHg. Pericardium: A moderately sized pericardial effusion is present is seen. A moderately sized pericardial effusion is present. The pericardial effusion is anterior to the right ventricle, localized near the right ventricle and surrounding the apex. Mitral Valve: The mitral valve is degenerative in appearance. Mild mitral annular calcification. MV Area by PHT, 2.11 cm. MV PHT, 104.11 msec. Tricuspid Valve: The tricuspid valve is grossly normal. Aortic Valve: The aortic valve is tricuspid. Mild aortic valve sclerosis is present, with no evidence of aortic valve stenosis. Aorta: The aortic root is normal in size and structure. Venous: The inferior vena cava is normal in size with greater than 50% respiratory variability, suggesting right atrial pressure of 3 mmHg.  LEFT VENTRICLE          Normals PLAX 2D LVIDd:         4.07 cm  3.6 cm   Diastology                 Normals LVIDs:         2.49 cm  1.7 cm   LV e' lateral:   2.50 cm/s 6.42 cm/s LV PW:         1.27 cm  1.4 cm   LV E/e' lateral: 20.4      15.4 LV IVS:  1.48 cm  1.3 cm   LV e' medial:    3.92 cm/s 6.96 cm/s LVOT diam:     2.10 cm  2.0 cm   LV E/e' medial:  13.0      6.96 LV SV:         51 ml    79 ml LV SV Index:   27.33    45 ml/m2 LVOT Area:     3.46 cm 3.14 cm2  RIGHT VENTRICLE RV S prime:     16.80 cm/s TAPSE (M-mode): 3.1 cm LEFT ATRIUM             Index       RIGHT ATRIUM           Index LA diam:        3.10 cm 1.68 cm/m  RA Area:     10.00 cm LA Vol (A2C):   27.2 ml 14.71 ml/m RA Volume:   17.70 ml  9.57 ml/m LA Vol (A4C):   43.7 ml 23.64 ml/m LA Biplane Vol: 36.8 ml 19.90 ml/m                                PULMONIC VALVE           Normals AORTA                 Normals PV Vmax:        0.63 m/s Ao Root diam: 3.70 cm 31 mm   PV Peak grad:   1.6 mmHg                               RVOT Peak grad: 2 mmHg  MITRAL VALVE                Normals MV Area (PHT): 2.11 cm             SHUNTS MV PHT:        104.11 msec 55 ms    Systemic Diam: 2.10 cm MV Decel Time: 359 msec    187 ms MV E velocity: 50.90 cm/s 103 cm/s MV A velocity: 83.40 cm/s 70.3 cm/s MV E/A ratio:  0.61       1.5  Kate Sable MD Electronically signed by Kate Sable MD Signature Date/Time: 05/10/2019/1:53:16 PMThe mitral valve is degenerative in appearance.    Final    ECHOCARDIOGRAM LIMITED  Result Date: 04/28/2019   ECHOCARDIOGRAM LIMITED REPORT   Patient Name:   NICKALOUS STINGLEY Date of Exam: 04/28/2019 Medical Rec #:  [id number]       Height:       68.0 in Accession #:    [id number]      Weight:       158.0 lb Date of Birth:  [birth year]       BSA:          1.85 m Patient Age:    25 years        BP:           106/84 mmHg Patient Gender: M               HR:           76 bpm. Exam Location:  [facility]  Procedure: Limited Echo, Color Doppler and Cardiac Doppler Indications:     Pericardial effusion 423.9  History:  Patient has prior history of Echocardiogram examinations, most                  recent 04/16/2019. Pericardial Effusion.  Sonographer:     Sherrie Sport RDCS (AE) Referring Phys:  [place] Diagnosing Phys: Ida Rogue MD IMPRESSIONS  1. Left ventricular ejection fraction, by visual estimation, is 60 to 65%. The left ventricle has normal function. There is no increased left ventricular wall thickness.  2. Global right ventricle has normal systolc function.The right ventricular size is normal. no increase in right ventricular wall thickness.  3. Moderate pericardial effusion, predominantly around the RV free wall, estimated 1.4 to 1.8 cm, Trace outside the LV free wall, no evidence of tamponade.  4. Tricuspid valve regurgitation is mild.  5. Normal pulmonary artery systolic pressure. FINDINGS  Left Ventricle: Left ventricular ejection fraction, by visual estimation, is 60 to 65%. The left ventricle has normal function. There is no  increased left ventricular wall thickness. Normal left atrial pressure. Right Ventricle: The right ventricular size is normal. No increase in right ventricular wall thickness. Global RV systolic function is has normal systolic function. Left Atrium: Left atrial size was normal in size. Right Atrium: Right atrial size was normal in size. Right atrial pressure is estimated at 10 mmHg. Pericardium: A moderately sized pericardial effusion is present is seen. A moderately sized pericardial effusion is present. Mitral Valve: The mitral valve is normal in structure. No evidence of mitral valve stenosis by observation. No evidence of mitral valve regurgitation. Tricuspid Valve: The tricuspid valve is normal in structure. Tricuspid valve regurgitation is mild. Aortic Valve: The aortic valve is normal in structure. Aortic valve regurgitation is not visualized. The aortic valve is structurally normal, with no evidence of sclerosis or stenosis. Pulmonic Valve: The pulmonic valve was normal in structure. Pulmonic valve regurgitation is not visualized by color flow Doppler. Pulmonic regurgitation is not visualized by color flow Doppler. Aorta: The aortic root, ascending aorta and aortic arch are all structurally normal, with no evidence of dilitation or obstruction. Venous: The inferior vena cava is normal in size with greater than 50% respiratory variability, suggesting right atrial pressure of 3 mmHg. Shunts: There is no evidence of a patent foramen ovale. No ventricular septal defect is seen or detected. There is no evidence of an atrial septal defect. No atrial level shunt detected by color flow Doppler.  LEFT VENTRICLE          Normals PLAX 2D LVIDd:         3.68 cm  3.6 cm LVIDs:         2.39 cm  1.7 cm LV PW:         1.34 cm  1.4 cm LV IVS:        0.77 cm  1.3 cm LVOT diam:     2.00 cm  2.0 cm LV SV:         37 ml    79 ml LV SV Index:   20.12    45 ml/m2 LVOT Area:     3.14 cm 3.14 cm2  RIGHT VENTRICLE RV Basal diam:   4.02 cm LEFT ATRIUM         Index LA diam:    3.40 cm 1.84 cm/m   AORTA                 Normals Ao Root diam: 3.30 cm 31 mm  SHUNTS Systemic Diam: 2.00 cm  Ida Rogue MD  Electronically signed by Ida Rogue MD Signature Date/Time: 04/28/2019/3:20:35 PMThe mitral valve is normal in structure.    Final    CT OUTSIDE FILMS CHEST  Result Date: 05/02/2019 This examination belongs to an outside facility and is stored here for comparison purposes only.  Contact the originating outside institution for any associated report or interpretation.  CT OUTSIDE FILMS CHEST  Result Date: 05/02/2019 This examination belongs to an outside facility and is stored here for comparison purposes only.  Contact the originating outside institution for any associated report or interpretation.   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  We are attempting to get this report as well as the cell block.  PET scan results from May 09, 2019 reviewed independently and reported as above with widespread bony metastatic disease.  Patient has agreed to port placement and palliative chemotherapy using carboplatinum, pemetrexed, and Avastin every 3 weeks.  He may also require Neulasta, but will hold for cycle 1.  Patient will also receive Zometa for his bony metastasis on odd-numbered cycles.  Return to clinic in approximately 2 weeks for further evaluation and consideration of cycle 1.  2.  Atrial fibrillation/cardiac disease: Appreciate cardiology input.  Continue Eliquis and amiodarone as prescribed. 3.  Prostate cancer: Gleason's 4+5.  Appears to be localized disease.  Patient completed XRT at [facility].  He will continue to receive Lupron from the [place]. 4.  Poor appetite/weight loss: Patient was given a prescription for Megace today.  I provided 30 minutes of  face-to-face video visit time during this encounter which included chart review, counseling, and coordination of care as documented above.   Patient expressed understanding and was in agreement with this plan. He also understands that He can call clinic at any time with any questions, concerns, or complaints.   Cancer Staging No matching staging information was found for the patient.  Lloyd Huger, MD   05/12/2019 6:40 AM

## 2019-05-14 ENCOUNTER — Other Ambulatory Visit: Payer: Self-pay

## 2019-05-14 ENCOUNTER — Emergency Department: Payer: Medicare Other

## 2019-05-14 ENCOUNTER — Inpatient Hospital Stay
Admission: EM | Admit: 2019-05-14 | Discharge: 2019-06-19 | DRG: 981 | Disposition: E | Payer: Medicare Other | Attending: Internal Medicine | Admitting: Internal Medicine

## 2019-05-14 ENCOUNTER — Encounter: Payer: Self-pay | Admitting: Emergency Medicine

## 2019-05-14 DIAGNOSIS — H9190 Unspecified hearing loss, unspecified ear: Secondary | ICD-10-CM | POA: Diagnosis present

## 2019-05-14 DIAGNOSIS — Z974 Presence of external hearing-aid: Secondary | ICD-10-CM

## 2019-05-14 DIAGNOSIS — R1310 Dysphagia, unspecified: Secondary | ICD-10-CM | POA: Diagnosis not present

## 2019-05-14 DIAGNOSIS — Z66 Do not resuscitate: Secondary | ICD-10-CM | POA: Diagnosis present

## 2019-05-14 DIAGNOSIS — Z91030 Bee allergy status: Secondary | ICD-10-CM

## 2019-05-14 DIAGNOSIS — R1112 Projectile vomiting: Secondary | ICD-10-CM | POA: Diagnosis present

## 2019-05-14 DIAGNOSIS — K219 Gastro-esophageal reflux disease without esophagitis: Secondary | ICD-10-CM | POA: Diagnosis present

## 2019-05-14 DIAGNOSIS — Z20822 Contact with and (suspected) exposure to covid-19: Secondary | ICD-10-CM | POA: Diagnosis present

## 2019-05-14 DIAGNOSIS — J449 Chronic obstructive pulmonary disease, unspecified: Secondary | ICD-10-CM | POA: Diagnosis present

## 2019-05-14 DIAGNOSIS — J9600 Acute respiratory failure, unspecified whether with hypoxia or hypercapnia: Principal | ICD-10-CM | POA: Diagnosis present

## 2019-05-14 DIAGNOSIS — G4700 Insomnia, unspecified: Secondary | ICD-10-CM | POA: Diagnosis present

## 2019-05-14 DIAGNOSIS — R05 Cough: Secondary | ICD-10-CM

## 2019-05-14 DIAGNOSIS — Z7901 Long term (current) use of anticoagulants: Secondary | ICD-10-CM

## 2019-05-14 DIAGNOSIS — R1110 Vomiting, unspecified: Secondary | ICD-10-CM | POA: Diagnosis present

## 2019-05-14 DIAGNOSIS — K4020 Bilateral inguinal hernia, without obstruction or gangrene, not specified as recurrent: Secondary | ICD-10-CM | POA: Diagnosis present

## 2019-05-14 DIAGNOSIS — I4891 Unspecified atrial fibrillation: Secondary | ICD-10-CM | POA: Diagnosis not present

## 2019-05-14 DIAGNOSIS — Z574 Occupational exposure to toxic agents in agriculture: Secondary | ICD-10-CM

## 2019-05-14 DIAGNOSIS — E785 Hyperlipidemia, unspecified: Secondary | ICD-10-CM | POA: Diagnosis present

## 2019-05-14 DIAGNOSIS — C7951 Secondary malignant neoplasm of bone: Secondary | ICD-10-CM | POA: Diagnosis present

## 2019-05-14 DIAGNOSIS — Z515 Encounter for palliative care: Secondary | ICD-10-CM | POA: Diagnosis present

## 2019-05-14 DIAGNOSIS — Z91048 Other nonmedicinal substance allergy status: Secondary | ICD-10-CM

## 2019-05-14 DIAGNOSIS — R112 Nausea with vomiting, unspecified: Secondary | ICD-10-CM | POA: Diagnosis not present

## 2019-05-14 DIAGNOSIS — Z6820 Body mass index (BMI) 20.0-20.9, adult: Secondary | ICD-10-CM

## 2019-05-14 DIAGNOSIS — I480 Paroxysmal atrial fibrillation: Secondary | ICD-10-CM | POA: Diagnosis present

## 2019-05-14 DIAGNOSIS — I10 Essential (primary) hypertension: Secondary | ICD-10-CM | POA: Diagnosis present

## 2019-05-14 DIAGNOSIS — R059 Cough, unspecified: Secondary | ICD-10-CM

## 2019-05-14 DIAGNOSIS — F4310 Post-traumatic stress disorder, unspecified: Secondary | ICD-10-CM | POA: Diagnosis present

## 2019-05-14 DIAGNOSIS — C61 Malignant neoplasm of prostate: Secondary | ICD-10-CM | POA: Diagnosis present

## 2019-05-14 DIAGNOSIS — Z79899 Other long term (current) drug therapy: Secondary | ICD-10-CM

## 2019-05-14 DIAGNOSIS — I959 Hypotension, unspecified: Secondary | ICD-10-CM | POA: Diagnosis present

## 2019-05-14 DIAGNOSIS — C3491 Malignant neoplasm of unspecified part of right bronchus or lung: Secondary | ICD-10-CM | POA: Diagnosis present

## 2019-05-14 DIAGNOSIS — N179 Acute kidney failure, unspecified: Secondary | ICD-10-CM | POA: Diagnosis present

## 2019-05-14 DIAGNOSIS — Z8546 Personal history of malignant neoplasm of prostate: Secondary | ICD-10-CM

## 2019-05-14 DIAGNOSIS — C7971 Secondary malignant neoplasm of right adrenal gland: Secondary | ICD-10-CM | POA: Diagnosis present

## 2019-05-14 DIAGNOSIS — E039 Hypothyroidism, unspecified: Secondary | ICD-10-CM | POA: Diagnosis present

## 2019-05-14 DIAGNOSIS — C779 Secondary and unspecified malignant neoplasm of lymph node, unspecified: Secondary | ICD-10-CM | POA: Diagnosis present

## 2019-05-14 DIAGNOSIS — I3139 Other pericardial effusion (noninflammatory): Secondary | ICD-10-CM | POA: Diagnosis present

## 2019-05-14 DIAGNOSIS — Z7189 Other specified counseling: Secondary | ICD-10-CM | POA: Diagnosis not present

## 2019-05-14 DIAGNOSIS — Z87891 Personal history of nicotine dependence: Secondary | ICD-10-CM

## 2019-05-14 DIAGNOSIS — Z888 Allergy status to other drugs, medicaments and biological substances status: Secondary | ICD-10-CM

## 2019-05-14 DIAGNOSIS — K222 Esophageal obstruction: Secondary | ICD-10-CM | POA: Diagnosis present

## 2019-05-14 DIAGNOSIS — E43 Unspecified severe protein-calorie malnutrition: Secondary | ICD-10-CM | POA: Diagnosis present

## 2019-05-14 DIAGNOSIS — Z96652 Presence of left artificial knee joint: Secondary | ICD-10-CM | POA: Diagnosis present

## 2019-05-14 DIAGNOSIS — I313 Pericardial effusion (noninflammatory): Secondary | ICD-10-CM | POA: Diagnosis present

## 2019-05-14 DIAGNOSIS — Z79890 Hormone replacement therapy (postmenopausal): Secondary | ICD-10-CM

## 2019-05-14 DIAGNOSIS — R0602 Shortness of breath: Secondary | ICD-10-CM

## 2019-05-14 LAB — URINALYSIS, COMPLETE (UACMP) WITH MICROSCOPIC
Bacteria, UA: NONE SEEN
Bilirubin Urine: NEGATIVE
Glucose, UA: NEGATIVE mg/dL
Hgb urine dipstick: NEGATIVE
Ketones, ur: NEGATIVE mg/dL
Leukocytes,Ua: NEGATIVE
Nitrite: NEGATIVE
Protein, ur: 100 mg/dL — AB
Specific Gravity, Urine: 1.026 (ref 1.005–1.030)
Squamous Epithelial / HPF: NONE SEEN (ref 0–5)
pH: 5 (ref 5.0–8.0)

## 2019-05-14 LAB — COMPREHENSIVE METABOLIC PANEL
ALT: 10 U/L (ref 0–44)
AST: 18 U/L (ref 15–41)
Albumin: 3.6 g/dL (ref 3.5–5.0)
Alkaline Phosphatase: 86 U/L (ref 38–126)
Anion gap: 10 (ref 5–15)
BUN: 21 mg/dL (ref 8–23)
CO2: 27 mmol/L (ref 22–32)
Calcium: 10.8 mg/dL — ABNORMAL HIGH (ref 8.9–10.3)
Chloride: 106 mmol/L (ref 98–111)
Creatinine, Ser: 1.36 mg/dL — ABNORMAL HIGH (ref 0.61–1.24)
GFR calc Af Amer: 57 mL/min — ABNORMAL LOW (ref >60)
GFR calc non Af Amer: 49 mL/min — ABNORMAL LOW (ref >60)
Glucose, Bld: 97 mg/dL (ref 70–99)
Potassium: 4 mmol/L (ref 3.5–5.1)
Sodium: 143 mmol/L (ref 135–145)
Total Bilirubin: 0.7 mg/dL (ref 0.3–1.2)
Total Protein: 7.3 g/dL (ref 6.5–8.1)

## 2019-05-14 LAB — RESPIRATORY PANEL BY RT PCR (FLU A&B, COVID)
Influenza A by PCR: NEGATIVE
Influenza B by PCR: NEGATIVE
SARS Coronavirus 2 by RT PCR: NEGATIVE

## 2019-05-14 LAB — CBC
HCT: 43.4 % (ref 39.0–52.0)
Hemoglobin: 13.4 g/dL (ref 13.0–17.0)
MCH: 29.5 pg (ref 26.0–34.0)
MCHC: 30.9 g/dL (ref 30.0–36.0)
MCV: 95.6 fL (ref 80.0–100.0)
Platelets: 286 10*3/uL (ref 150–400)
RBC: 4.54 MIL/uL (ref 4.22–5.81)
RDW: 16.6 % — ABNORMAL HIGH (ref 11.5–15.5)
WBC: 5.2 10*3/uL (ref 4.0–10.5)
nRBC: 0 % (ref 0.0–0.2)

## 2019-05-14 LAB — PROTIME-INR
INR: 1.3 — ABNORMAL HIGH (ref 0.8–1.2)
Prothrombin Time: 15.6 seconds — ABNORMAL HIGH (ref 11.4–15.2)

## 2019-05-14 LAB — APTT: aPTT: 28 seconds (ref 24–36)

## 2019-05-14 LAB — TROPONIN I (HIGH SENSITIVITY)
Troponin I (High Sensitivity): 240 ng/L (ref <18)
Troponin I (High Sensitivity): 249 ng/L (ref <18)

## 2019-05-14 LAB — LIPASE, BLOOD: Lipase: 29 U/L (ref 11–51)

## 2019-05-14 MED ORDER — HEPARIN BOLUS VIA INFUSION
4000.0000 [IU] | Freq: Once | INTRAVENOUS | Status: AC
  Administered 2019-05-14: 16:00:00 4000 [IU] via INTRAVENOUS
  Filled 2019-05-14: qty 4000

## 2019-05-14 MED ORDER — SODIUM CHLORIDE 0.9 % IV BOLUS: 500.0000 mL | Freq: Once | INTRAVENOUS | Status: DC

## 2019-05-14 MED ORDER — HEPARIN (PORCINE) 25000 UT/250ML-% IV SOLN
1000.0000 [IU]/h | INTRAVENOUS | Status: DC
  Administered 2019-05-14: 1000 [IU]/h via INTRAVENOUS
  Filled 2019-05-14: qty 250

## 2019-05-14 MED ORDER — ONDANSETRON HCL 4 MG/2ML IJ SOLN
4.0000 mg | Freq: Once | INTRAMUSCULAR | Status: AC
  Administered 2019-05-14: 4 mg via INTRAVENOUS
  Filled 2019-05-14: qty 2

## 2019-05-14 MED ORDER — SODIUM CHLORIDE 0.9 % IV BOLUS
1000.0000 mL | Freq: Once | INTRAVENOUS | Status: AC
  Administered 2019-05-14: 12:00:00 1000 mL via INTRAVENOUS

## 2019-05-14 MED ORDER — SODIUM CHLORIDE 0.9 % IV BOLUS
1000.0000 mL | Freq: Once | INTRAVENOUS | Status: AC
  Administered 2019-05-14: 1000 mL via INTRAVENOUS

## 2019-05-14 NOTE — Assessment & Plan Note (Signed)
Pt is  Waiting for PICC line by general surgeon. Oncologist is Candor.

## 2019-05-14 NOTE — ED Triage Notes (Signed)
Pt comes into the ED via EMS from home with n/v for the past 2 weeks with generalized weakness. States he was able to ambulate to the stretcher for EMS, hx of bone CA and has not started chemo yet..128/78, HR 72, CBG 109, 98.3 temp.Marland Kitchen

## 2019-05-14 NOTE — ED Notes (Signed)
Attempted to call report, floor states they are unsure of room assignment.

## 2019-05-14 NOTE — Assessment & Plan Note (Signed)
He has dysphagia since about a month.  Reports some blood in his vomitus.

## 2019-05-14 NOTE — ED Triage Notes (Signed)
Pt arrived via [facility] from home with reports of n/v x [redacted] weeks along with weakness.  Pt states he has hx of afib and lung and bone cancer, not currently on chemo at this time.   Pt states he has been unable to drink fluids x 4-5 days and has not eaten since last night which was cream of wheat.  Pt states between 2a-5a he had what he describes as heavy coughing and vomiting.  Pt denies any pain at this time.

## 2019-05-14 NOTE — Assessment & Plan Note (Signed)
Pt has been vomiting for past 4 days and has not been able to keep anything down. Pt does not have GI. D/w pt about  GI consult if needed.

## 2019-05-14 NOTE — ED Provider Notes (Signed)
[facility] Emergency Department Provider Note  ____________________________________________   First MD Initiated Contact with Patient 05/04/2019 1013     (approximate)  I have reviewed the triage vital signs and the nursing notes.   HISTORY  Chief Complaint Emesis    HPI Carlos Barrera is a 79 y.o. 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 fib on Eliquis who comes in from home for weakness and nausea and vomiting.  Patient is not currently on chemotherapy.  Patient's been unable to eat or drink for for 5 days.  Patient states that he has difficulty swallowing foods most notably solids.  But even liquids have become difficult.  He states that he tries to get them down he will within seconds bring them back up.  This has been going on intermittently for a few weeks.  Nothing makes it better, nothing makes it worse.  He did have a some associated left sided chest pain today with it so he wanted to get it evaluated.  Patient states that he is interested in chemotherapy but he is waiting for a PICC line to be placed.  He had a recent admission for A. fib with RVR and was noted to have a pericardial effusion.  They attempted to drain the pericardial effusion but were unable to.  He had a repeat echo showing that it was stable just a few days ago on 1/20.  He denies any worsening shortness of breath.  He has had a mild cough as well.          Past Medical History:  Diagnosis Date  . Agent orange exposure   . Asthma    exercise induced asthma   . COPD (chronic obstructive pulmonary disease) ([place])   . DJD (degenerative joint disease)    a. 02/2019 s/p L TKA.  Marland Kitchen GERD (gastroesophageal reflux disease)   . Hearing loss    wears hearing aids  . History of cardiac cath    a. History of 2 cardiac catheterizations, last ~ 10 yrs ago @ [facility]-->reportedly nl.  . History of stress test    a. 2020 - pharmacologic stress testing @  [facility]-->reportedly nl.  . Hyperlipidemia   . Hypertension   . Hypothyroidism   . Insomnia   . Lung cancer ([place])    a. Dx 03/2019.  Marland Kitchen Pericardial effusion    a. 03/2019 Echo: EF 60-65%, no rwma, nl RV fxn. Mildly dil LA. Mod pericardia eff w/o tamponade. Mild Ao sclerosis.  . Prostate cancer ([place])   . PTSD (post-traumatic stress disorder)   . Seasonal allergies   . Thyroid disease     Patient Active Problem List   Diagnosis Date Noted  . Pericardial effusion 04/27/2019  . Hypothyroidism 04/27/2019  . Hypertension   . Hyperlipidemia   . Goals of care, counseling/discussion 04/25/2019  . Prostate cancer ([place]) 04/25/2019  . Intractable vomiting   . Nonspecific chest pain 04/22/2019  . Adenocarcinoma of right lung ([place]) 04/16/2019  . Paroxysmal atrial fibrillation ([place]) 04/16/2019  . Atrial fibrillation with RVR ([place]) 04/16/2019  . Hypotension 04/16/2019  . Arthritis of left knee 03/09/2019    Past Surgical History:  Procedure Laterality Date  . BACK SURGERY     x 2  . COLONOSCOPY     hx polyps  . HERNIA REPAIR    . INCISE AND DRAIN ABCESS     patient denies this procedure 03/03/19  . NECK SURGERY  x 1  . PERICARDIOCENTESIS N/A 04/28/2019   Procedure: PERICARDIOCENTESIS;  Surgeon: Wellington Hampshire, MD;  Location: [place] [facility];  Service: Cardiovascular;  Laterality: N/A;  . TONSILLECTOMY    . 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 TOTAL KNEE ARTHROPLASTY Left 03/09/2019   Procedure: LEFT TOTAL KNEE ARTHROPLASTY;  Surgeon: Meredith Pel, MD;  Location: [facility];  Service: Orthopedics;  Laterality: Left;  . WISDOM TOOTH EXTRACTION      Prior to Admission medications   Medication Sig Start Date End Date Taking? Authorizing Provider  alum & mag hydroxide-simeth (MAALOX/MYLANTA) 200-200-20 MG/5ML suspension Take 30 mLs by mouth every 6 (six) hours as needed for indigestion or heartburn. 04/23/19   Thornell Mule, MD  amiodarone (PACERONE) 200 MG tablet Take 1  tablet (200 mg total) by mouth 2 (two) times daily. 04/29/19   Nolberto Hanlon, MD  apixaban (ELIQUIS) 5 MG TABS tablet Take 1 tablet (5 mg total) by mouth 2 (two) times daily. 04/21/19   Pokhrel, Corrie Mckusick, MD  cetirizine (ZYRTEC) 10 MG tablet Take 10 mg by mouth daily.  02/13/18   [provider]  folic acid (FOLVITE) 1 MG tablet Take 1 mg by mouth daily.    [provider]  folic acid (FOLVITE) 1 MG tablet Take 1 tablet (1 mg total) by mouth daily. Start 5-7 days before Alimta chemotherapy. Continue until 21 days after Alimta completed. 05/12/19   Lloyd Huger, MD  levothyroxine (SYNTHROID) 88 MCG tablet Take 88 mcg by mouth daily before breakfast.    [provider]  lidocaine-prilocaine (EMLA) cream Apply to affected area once 05/12/19   Lloyd Huger, MD  megestrol (MEGACE) 40 MG tablet Take 1 tablet (40 mg total) by mouth daily. 05/11/19   Lloyd Huger, MD  ondansetron (ZOFRAN ODT) 4 MG disintegrating tablet Take 1 tablet (4 mg total) by mouth every 8 (eight) hours as needed for nausea or vomiting. 04/23/19   Thornell Mule, MD  ondansetron (ZOFRAN) 8 MG tablet Take 1 tablet (8 mg total) by mouth 2 (two) times daily as needed for refractory nausea / vomiting. 05/12/19   Lloyd Huger, MD  polyethylene glycol (MIRALAX / GLYCOLAX) 17 g packet Take 17 g by mouth daily. 04/21/19   Pokhrel, Corrie Mckusick, MD  pravastatin (PRAVACHOL) 20 MG tablet Take 20 mg by mouth at bedtime.    [provider]  prazosin (MINIPRESS) 5 MG capsule Take 10 mg by mouth at bedtime.  02/21/18   [provider]  prochlorperazine (COMPAZINE) 10 MG tablet Take 1 tablet (10 mg total) by mouth every 6 (six) hours as needed (Nausea or vomiting). 05/12/19   Lloyd Huger, MD  REFRESH TEARS 0.5 % SOLN Place 1 drop into both eyes 2 (two) times daily.  02/13/18   [provider]  sertraline (ZOLOFT) 50 MG tablet Take 50 mg by mouth at bedtime.  12/14/18   [provider]  tamsulosin (FLOMAX) 0.4 MG CAPS capsule Take 0.4 mg by mouth daily.  01/06/19   [provider]  traZODone (DESYREL) 50 MG tablet Take 50 mg by mouth at bedtime.    [provider]    Allergies Bee venom and Feldene [piroxicam]  Family History  Problem Relation Age of Onset  . Breast cancer Mother        w/ brain mets -> died in her 25's.  Marland Kitchen Heart attack Father        died @ 2  .  Other Brother        multiple medical issues, pt unaware of specifics.    Social History Social History   Tobacco Use  . Smoking status: Former Smoker    Packs/day: 2.00    Years: 20.00    Pack years: 40.00    Types: Cigarettes    Quit date: 04/20/1980    Years since quitting: 39.0  . Smokeless tobacco: Never Used  Substance Use Topics  . Alcohol use: No  . Drug use: No      Review of Systems Constitutional: No fever/chills Eyes: No visual changes. ENT: No sore throat. Cardiovascular: Positive chest pain Respiratory: Denies shortness of breath. Gastrointestinal: No abdominal pain.  Positive nausea and vomiting Genitourinary: Negative for dysuria. Musculoskeletal: Negative for back pain. Skin: Negative for rash. Neurological: Negative for headaches, focal weakness or numbness. All other ROS negative ____________________________________________   PHYSICAL EXAM:  VITAL SIGNS: ED Triage Vitals  Enc Vitals Group     BP 05/09/2019 1005 (!) 113/92     Pulse Rate 05/02/2019 1005 76     Resp 04/23/2019 1005 16     Temp 05/02/2019 1005 97.7 F (36.5 C)     Temp Source 05/15/2019 1005 Oral     SpO2 05/11/2019 1005 97 %     Weight 05/06/2019 1006 147 lb 12 oz (67 kg)     Height 05/17/2019 1006 5\' 8"  (1.727 m)     Head Circumference --      Peak Flow --      Pain Score 05/03/2019 1006 0     Pain Loc --      Pain Edu? --      Excl. in [place]? --     Constitutional: Alert and oriented.  Thin male but in no acute distress. Eyes: Conjunctivae are normal. EOMI. Head:  Atraumatic. Nose: No congestion/rhinnorhea. Mouth/Throat: Mucous membranes are moist.   Neck: No stridor. Trachea Midline. FROM Cardiovascular: Normal rate, regular rhythm. Grossly normal heart sounds.  Good peripheral circulation. Respiratory: Normal respiratory effort.  No retractions. Lungs CTAB. Gastrointestinal: Soft and nontender.  Well-healing wound from the prior pericardiocentesis.  No distention. No abdominal bruits.  Musculoskeletal: No lower extremity tenderness nor edema.  No joint effusions. Neurologic:  Normal speech and language. No gross focal neurologic deficits are appreciated.  Skin:  Skin is warm, dry and intact. No rash noted. Psychiatric: Mood and affect are normal. Speech and behavior are normal. GU: Deferred   ____________________________________________   LABS (all labs ordered are listed, but only abnormal results are displayed)  Labs Reviewed  COMPREHENSIVE METABOLIC PANEL - Abnormal; Notable for the following components:      Result Value   Creatinine, Ser 1.36 (*)    Calcium 10.8 (*)    GFR calc non Af Amer 49 (*)    GFR calc Af Amer 57 (*)    All other components within normal limits  CBC - Abnormal; Notable for the following components:   RDW 16.6 (*)    All other components within normal limits  TROPONIN I (HIGH SENSITIVITY) - Abnormal; Notable for the following components:   Troponin I (High Sensitivity) 240 (*)    All other components within normal limits  RESPIRATORY PANEL BY RT PCR (FLU A&B, COVID)  LIPASE, BLOOD  URINALYSIS, COMPLETE (UACMP) WITH MICROSCOPIC  TROPONIN I (HIGH SENSITIVITY)   ____________________________________________   ED ECG REPORT I, Vanessa [place], the attending physician, personally viewed and interpreted this ECG.  EKG is normal sinus  rate of 79, no ST elevation, no T wave inversions, normal intervals, somewhat wandering baseline. ____________________________________________  RADIOLOGY Robert Bellow,  personally viewed and evaluated these images (plain radiographs) as part of my medical decision making, as well as reviewing the written report by the radiologist.  ED MD interpretation:  Similar right hilar fullness   Official radiology report(s): DG Chest Portable 1 View  Result Date: 04/23/2019 CLINICAL DATA:  Nausea, vomiting and weakness for 2 weeks. History of lung carcinoma. EXAM: PORTABLE CHEST 1 VIEW COMPARISON:  PA and lateral chest 04/27/2019.  CT chest 04/15/2019. FINDINGS: The lungs are emphysematous but clear. There is some volume loss in the right chest. No pneumothorax or pleural effusion. Heart size is normal. Fullness of the right hilum is consistent with lymphadenopathy seen on prior CT. IMPRESSION: No acute disease. Emphysema. Right hilar fullness consistent with lymphadenopathy seen on prior CT. Electronically Signed   By: Inge Rise M.D.   On: 04/26/2019 10:56    ____________________________________________   PROCEDURES  Procedure(s) performed (including Critical Care):  Procedures   ____________________________________________   INITIAL IMPRESSION / ASSESSMENT AND PLAN / ED COURSE  GURSHAAN MATSUOKA was evaluated in Emergency Department on 05/01/2019 for the symptoms described in the history of present illness. He was evaluated in the context of the global COVID-19 pandemic, which necessitated consideration that the patient might be at risk for infection with the SARS-CoV-2 virus that causes COVID-19. Institutional protocols and algorithms that pertain to the evaluation of patients at risk for COVID-19 are in a state of rapid change based on information released by regulatory bodies including the CDC and federal and state organizations. These policies and algorithms were followed during the patient's care in the ED.    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 It sounds like the issue could be protruding more from his throat than to his abdomen.  His abdomen is soft and nontender so I have low suspicion for bowel obstruction at this time.  Given patient been unable to tolerate p.o. will get labs to evaluate for electrolyte abnormalities, AKI, Covid swab.  Patient is on blood thinners have low suspicion for PE and denies sob.  Will get cardiac markers given the report of some chest discomfort.  Labs show new elevation in his creatinine up to 1.36 consistent with his dehydration.  Patient also seems to going to A. fib with RVR.  Will get repeat EKG.  Troponin was elevated at 240 which is higher than when he was last here for A. fib with RVR.   Repeat EKG shows atrial fibrillation rate of 122, no ST elevations, T wave inversions.  Patient could be going slightly fast due to his dehydration.  Will give another liter of fluids before reassessing giving him his amnio.  Unclear patient has been compliant with his medicines as he says he is having a hard time taking his medicines and when I asked him about his apixaban he states he is he was not sure if he has been taking it.  Therefore if his rates are coming back down with fluids we may need to consider just trying to rate control him.  I think at this time we can hold off on CT abdomen given he has had one at the beginning of January and there was no evidence of tumor or mass that  could be contributing to his symptoms.  He may need some swallowing studies given his inability to tolerate p.o.  We will discuss with hospital team given AKI, A. fib, elevated cardiac marker.      ____________________________________________   FINAL CLINICAL IMPRESSION(S) / ED DIAGNOSES   Final diagnoses:  AKI (acute kidney injury) ([place])  Nausea and vomiting, intractability of vomiting not specified, unspecified vomiting type      MEDICATIONS GIVEN DURING THIS  VISIT:  Medications  sodium chloride 0.9 % bolus 1,000 mL (has no administration in time range)  sodium chloride 0.9 % bolus 1,000 mL (0 mLs Intravenous Stopped 04/22/2019 1229)  ondansetron (ZOFRAN) injection 4 mg (4 mg Intravenous Given 05/05/2019 1133)     ED Discharge Orders    None       Note:  This document was prepared using Dragon voice recognition software and may include unintentional dictation errors.   Vanessa [place], MD 04/25/2019 [id number]

## 2019-05-14 NOTE — H&P (Signed)
History and Physical    Carlos Barrera:[id number] DOB: 07-04-1940 DOA: 05/08/2019   PCP: [facility]   Outpatient Specialists: Cardiology.   Patient coming from: Home  Chief Complaint: Dysphagia/ intractable vomiting.   HPI: Carlos Barrera is a 79 y.o. male with a hx of 79 y.o.malewith a hx of atrial fibrillation, COPD, HTN, HLD, agent orange exposure, lung cancer, remote prostate cancer, hypothyroidism, remote tobacco abuse (40 pack years, quit 1982)last seen while hospitalized.  04/15/19 admitted for new onset atrial fib. Transitioned to amiodarone and Eliquis. Held off on beta blocker secondary to hypotension. Noted moderate pericardial effusion.   Readmitted 04/22/19-04/23/19 for chest pain and nausea. Troponin elevation thought to be due to demand ischemia.  Seen by me in the office 04/21/2019 after being seen in the [facility] and having an echocardiogram done and being told that he needed to present to the ED or seek his cardiologist urgently.  Echo from that day was reviewed by Dr. Saunders Revel in the office and suggestive of tamponade and he was sent to the ED.  He was admitted 04/27/2019-04/28/2010.  He did have paroxysmal atrial fibrillation while in the hospital.  His amiodarone was resumed.  His pericardial effusion remained moderate in size, interventional cardiology attending.  Cardiocentesis however unsuccessful as effusion was not circumferential and was predominantly outside RV free wall could not be drained.  In the interim he has had PET scan for further evaluation of his lung cancer.  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  He met virtually his with his oncologist today with plans for PICC in urgent chemotherapy.   He had a repeat echocardiogram yesterday which showed stable moderate sized pericardial effusion.  No physiology suggestive of tamponade.  Reports continued dysphagia and trouble  keeping food down.  Reports decrease in appetite, decrease in taste.  Reports lots of cough with phlegm and prohibits him from getting as much the would like.  Tells me if he takes a sip of water it "bounces right back".  Reports this has been going on longer than 3 weeks.  He discussed with her oncologist with wanting his prescribed Megace.     Review of Systems: As per HPI otherwise 10 point review of systems negative.    Past Medical History:  Diagnosis Date  . Agent orange exposure   . Asthma    exercise induced asthma   . COPD (chronic obstructive pulmonary disease) ([place])   . DJD (degenerative joint disease)    a. 02/2019 s/p L TKA.  Marland Kitchen GERD (gastroesophageal reflux disease)   . Hearing loss    wears hearing aids  . History of cardiac cath    a. History of 2 cardiac catheterizations, last ~ 10 yrs ago @ [facility]-->reportedly nl.  . History of stress test    a. 2020 - pharmacologic stress testing @ [facility]-->reportedly nl.  . Hyperlipidemia   . Hypertension   . Hypothyroidism   . Insomnia   . Lung cancer ([place])    a. Dx 03/2019.  Marland Kitchen Pericardial effusion    a. 03/2019 Echo: EF 60-65%, no rwma, nl RV fxn. Mildly dil LA. Mod pericardia eff w/o tamponade. Mild Ao sclerosis.  . Prostate cancer ([place])   . PTSD (post-traumatic stress disorder)   . Seasonal allergies   . Thyroid disease     Past Surgical History:  Procedure Laterality Date  . BACK SURGERY     x 2  .  COLONOSCOPY     hx polyps  . HERNIA REPAIR    . INCISE AND DRAIN ABCESS     patient denies this procedure 03/03/19  . NECK SURGERY     x 1  . PERICARDIOCENTESIS N/A 04/28/2019   Procedure: PERICARDIOCENTESIS;  Surgeon: Wellington Hampshire, MD;  Location: [facility];  Service: Cardiovascular;  Laterality: N/A;  . TONSILLECTOMY    . 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 TOTAL KNEE ARTHROPLASTY Left 03/09/2019   Procedure: LEFT TOTAL KNEE ARTHROPLASTY;  Surgeon: Meredith Pel, MD;  Location: [facility];   Service: Orthopedics;  Laterality: Left;  . WISDOM TOOTH EXTRACTION       reports that he quit smoking about 39 years ago. His smoking use included cigarettes. He has a 40.00 pack-year smoking history. He has never used smokeless tobacco. He reports that he does not drink alcohol or use drugs.  Allergies  Allergen Reactions  . Bee Venom Anaphylaxis  . Feldene [Piroxicam] Hives    Family History  Problem Relation Age of Onset  . Breast cancer Mother        w/ brain mets -> died in her 12's.  Marland Kitchen Heart attack Father        died @ 56  . Other Brother        multiple medical issues, pt unaware of specifics.    Prior to Admission medications   Medication Sig Start Date End Date Taking? Authorizing Provider  alum & mag hydroxide-simeth (MAALOX/MYLANTA) 200-200-20 MG/5ML suspension Take 30 mLs by mouth every 6 (six) hours as needed for indigestion or heartburn. 04/23/19   Thornell Mule, MD  amiodarone (PACERONE) 200 MG tablet Take 1 tablet (200 mg total) by mouth 2 (two) times daily. 04/29/19   Nolberto Hanlon, MD  apixaban (ELIQUIS) 5 MG TABS tablet Take 1 tablet (5 mg total) by mouth 2 (two) times daily. 04/21/19   Pokhrel, Corrie Mckusick, MD  cetirizine (ZYRTEC) 10 MG tablet Take 10 mg by mouth daily.  02/13/18   [provider]  folic acid (FOLVITE) 1 MG tablet Take 1 mg by mouth daily.    [provider]  folic acid (FOLVITE) 1 MG tablet Take 1 tablet (1 mg total) by mouth daily. Start 5-7 days before Alimta chemotherapy. Continue until 21 days after Alimta completed. 05/12/19   Lloyd Huger, MD  levothyroxine (SYNTHROID) 88 MCG tablet Take 88 mcg by mouth daily before breakfast.    [provider]  lidocaine-prilocaine (EMLA) cream Apply to affected area once 05/12/19   Lloyd Huger, MD  megestrol (MEGACE) 40 MG tablet Take 1 tablet (40 mg total) by mouth daily. 05/11/19   Lloyd Huger, MD  ondansetron (ZOFRAN ODT) 4 MG disintegrating tablet Take 1 tablet (4  mg total) by mouth every 8 (eight) hours as needed for nausea or vomiting. 04/23/19   Thornell Mule, MD  ondansetron (ZOFRAN) 8 MG tablet Take 1 tablet (8 mg total) by mouth 2 (two) times daily as needed for refractory nausea / vomiting. 05/12/19   Lloyd Huger, MD  polyethylene glycol (MIRALAX / GLYCOLAX) 17 g packet Take 17 g by mouth daily. 04/21/19   Pokhrel, Corrie Mckusick, MD  pravastatin (PRAVACHOL) 20 MG tablet Take 20 mg by mouth at bedtime.    [provider]  prazosin (MINIPRESS) 5 MG capsule Take 10 mg by mouth at bedtime.  02/21/18   [provider]  prochlorperazine (COMPAZINE) 10 MG tablet Take 1 tablet (10  mg total) by mouth every 6 (six) hours as needed (Nausea or vomiting). 05/12/19   Lloyd Huger, MD  REFRESH TEARS 0.5 % SOLN Place 1 drop into both eyes 2 (two) times daily.  02/13/18   [provider]  sertraline (ZOLOFT) 50 MG tablet Take 50 mg by mouth at bedtime.  12/14/18   [provider]  tamsulosin (FLOMAX) 0.4 MG CAPS capsule Take 0.4 mg by mouth daily.  01/06/19   [provider]  traZODone (DESYREL) 50 MG tablet Take 50 mg by mouth at bedtime.    [provider]    Physical Exam: Vitals:   04/24/2019 1119 05/06/2019 1200 04/28/2019 1215 04/26/2019 1300  BP: 131/62 (!) 118/55  (!) 145/81  Pulse: 69 68 97 80  Resp: (!) '21 15 19 ' (!) 21  Temp:      TempSrc:      SpO2: 97% 94% 96% 95%  Weight:      Height:          Constitutional: NAD, calm, comfortable Vitals:   05/08/2019 1119 05/11/2019 1200 04/26/2019 1215 04/21/2019 1300  BP: 131/62 (!) 118/55  (!) 145/81  Pulse: 69 68 97 80  Resp: (!) '21 15 19 ' (!) 21  Temp:      TempSrc:      SpO2: 97% 94% 96% 95%  Weight:      Height:       Eyes: PERRL, lids and conjunctivae normal ENMT: Mucous membranes are moist. Posterior pharynx clear of any exudate or lesions.Normal dentition.  Neck: normal, supple, no masses, no thyromegaly Respiratory: clear to auscultation  bilaterally, no wheezing, no crackles. Normal respiratory effort. No accessory muscle use.  Cardiovascular: Regular rate and rhythm, no murmurs / rubs / gallops. No extremity edema. 2+ pedal pulses. No carotid bruits.  Abdomen: no tenderness, no masses palpated. No hepatosplenomegaly. Bowel sounds positive.  Musculoskeletal: no clubbing / cyanosis. No joint deformity upper and lower extremities. Good ROM, no contractures. Normal muscle tone.  Skin: no rashes, lesions, ulcers. No induration Neurologic: CN 2-12 grossly intact. Sensation intact, DTR normal. Strength 5/5 in all 4.  Psychiatric: Normal judgment and insight. Alert and oriented x 3. Normal mood.     Labs on Admission: I have personally reviewed following labs and imaging studies  CBC: Recent Labs  Lab 05/15/2019 1120  WBC 5.2  HGB 13.4  HCT 43.4  MCV 95.6  PLT 010   Basic Metabolic Panel: Recent Labs  Lab 04/28/2019 1120  NA 143  K 4.0  CL 106  CO2 27  GLUCOSE 97  BUN 21  CREATININE 1.36*  CALCIUM 10.8*   GFR: Estimated Creatinine Clearance: 42.4 mL/min (A) (by C-G formula based on SCr of 1.36 mg/dL (H)). Liver Function Tests: Recent Labs  Lab 04/24/2019 1120  AST 18  ALT 10  ALKPHOS 86  BILITOT 0.7  PROT 7.3  ALBUMIN 3.6   Recent Labs  Lab 05/04/2019 1120  LIPASE 29   No results for input(s): AMMONIA in the last 168 hours. Coagulation Profile: No results for input(s): INR, PROTIME in the last 168 hours. Cardiac Enzymes: No results for input(s): CKTOTAL, CKMB, CKMBINDEX, TROPONINI in the last 168 hours. BNP (last 3 results) No results for input(s): PROBNP in the last 8760 hours. HbA1C: No results for input(s): HGBA1C in the last 72 hours. CBG: Recent Labs  Lab 05/09/19 1051  GLUCAP 103*   Lipid Profile: No results for input(s): CHOL, HDL, LDLCALC, TRIG, CHOLHDL, LDLDIRECT in the last  72 hours. Thyroid Function Tests: No results for input(s): TSH, T4TOTAL, FREET4, T3FREE, THYROIDAB in the last  72 hours. Anemia Panel: No results for input(s): VITAMINB12, FOLATE, FERRITIN, TIBC, IRON, RETICCTPCT in the last 72 hours. Urine analysis:    Component Value Date/Time   COLORURINE YELLOW (A) 04/22/2019 1916   APPEARANCEUR CLEAR (A) 04/22/2019 1916   LABSPEC >1.046 (H) 04/22/2019 1916   PHURINE 5.0 04/22/2019 1916   GLUCOSEU NEGATIVE 04/22/2019 1916   HGBUR NEGATIVE 04/22/2019 1916   BILIRUBINUR NEGATIVE 04/22/2019 1916   KETONESUR NEGATIVE 04/22/2019 1916   PROTEINUR 30 (A) 04/22/2019 1916   UROBILINOGEN 1.0 05/22/2009 1525   NITRITE NEGATIVE 04/22/2019 1916   LEUKOCYTESUR NEGATIVE 04/22/2019 1916    Recent Results (from the past 240 hour(s))  Respiratory Panel by RT PCR (Flu A&B, Covid) - Nasopharyngeal Swab     Status: None   Collection Time: 05/01/2019 11:20 AM   Specimen: Nasopharyngeal Swab  Result Value Ref Range Status   SARS Coronavirus 2 by RT PCR NEGATIVE NEGATIVE Final    Comment: (NOTE) SARS-CoV-2 target nucleic acids are NOT DETECTED. The SARS-CoV-2 RNA is generally detectable in upper respiratoy specimens during the acute phase of infection. The lowest concentration of SARS-CoV-2 viral copies this assay can detect is 131 copies/mL. A negative result does not preclude SARS-Cov-2 infection and should not be used as the sole basis for treatment or other patient management decisions. A negative result may occur with  improper specimen collection/handling, submission of specimen other than nasopharyngeal swab, presence of viral mutation(s) within the areas targeted by this assay, and inadequate number of viral copies (<131 copies/mL). A negative result must be combined with clinical observations, patient history, and epidemiological information. The expected result is Negative. Fact Sheet for Patients:  PinkCheek.be Fact Sheet for Healthcare Providers:  GravelBags.it This test is not yet ap proved or cleared  by the [place] FDA and  has been authorized for detection and/or diagnosis of SARS-CoV-2 by FDA under an Emergency Use Authorization (EUA). This EUA will remain  in effect (meaning this test can be used) for the duration of the COVID-19 declaration under Section 564(b)(1) of the Act, 21 U.S.C. section 360bbb-3(b)(1), unless the authorization is terminated or revoked sooner.    Influenza A by PCR NEGATIVE NEGATIVE Final   Influenza B by PCR NEGATIVE NEGATIVE Final    Comment: (NOTE) The Xpert Xpress SARS-CoV-2/FLU/RSV assay is intended as an aid in  the diagnosis of influenza from Nasopharyngeal swab specimens and  should not be used as a sole basis for treatment. Nasal washings and  aspirates are unacceptable for Xpert Xpress SARS-CoV-2/FLU/RSV  testing. Fact Sheet for Patients: PinkCheek.be Fact Sheet for Healthcare Providers: GravelBags.it This test is not yet approved or cleared by the [place] FDA and  has been authorized for detection and/or diagnosis of SARS-CoV-2 by  FDA under an Emergency Use Authorization (EUA). This EUA will remain  in effect (meaning this test can be used) for the duration of the  Covid-19 declaration under Section 564(b)(1) of the Act, 21  U.S.C. section 360bbb-3(b)(1), unless the authorization is  terminated or revoked. Performed at [facility], [address] [place], [place] [address]      Radiological Exams on Admission: DG Chest Portable 1 View  Result Date: 04/27/2019 CLINICAL DATA:  Nausea, vomiting and weakness for 2 weeks. History of lung carcinoma. EXAM: PORTABLE CHEST 1 VIEW COMPARISON:  PA and lateral chest 04/27/2019.  CT chest 04/15/2019. FINDINGS: The lungs  are emphysematous but clear. There is some volume loss in the right chest. No pneumothorax or pleural effusion. Heart size is normal. Fullness of the right hilum is consistent with lymphadenopathy seen on  prior CT. IMPRESSION: No acute disease. Emphysema. Right hilar fullness consistent with lymphadenopathy seen on prior CT. Electronically Signed   By: Inge Rise M.D.   On: 04/25/2019 10:56    EKG: Independently reviewed.: A.fib rvr 122.   Echo 05/10/2019: 1. Left ventricular ejection fraction, by visual estimation, is 55 to 60%. The left ventricle has normal function. 2. Left ventricular diastolic parameters are consistent with Grade II diastolic dysfunction (pseudonormalization). 3. The left ventricle has no regional wall motion abnormalities. 4. Global right ventricle has normal systolc function.The right ventricular size is normal. 5. Moderate pericardial effusion. 6. The pericardial effusion is anterior to the right ventricle, localized near the right ventricle and surrounding the apex. 7. Mild mitral annular calcification. 8. The mitral valve is degenerative. 9. The tricuspid valve was grossly normal. 10. Mild aortic valve sclerosis without stenosis. 11. The inferior vena cava is normal in size with greater than 50% respiratory variability, suggesting right atrial pressure of 3 mmHg.   Assessment/Plan Active Problems:   Adenocarcinoma of right lung (HCC)   Paroxysmal atrial fibrillation (HCC)   Intractable vomiting   Pericardial effusion   Hypertension   Dysphagia     Dysphagia Assessment & Plan He has dysphagia since about a month.  Reports some blood in his vomitus.      Chest CT done 05/02/2019 but reports not available.      Will get barium esophagogram.  Hypertension Assessment & Plan Pt states his bp meds were put on hold because of the thinner.   Paroxysmal atrial fibrillation (HCC) Assessment & Plan Amiodarone 200 mg daily Held. D/w pharmacist and changed to iv and started heparin for anticoagulation.   Adenocarcinoma of right lung [facility]) Assessment & Plan Pt is  Waiting for PICC line by general surgeon. Oncologist is [place].   Intractable  vomiting Assessment & Plan Pt has been vomiting for past 4 days and has not been able to keep anything down. Pt does not have GI. D/w pt about  GI consult if needed.  Hypothyroidism Assessment & Plan Pt has been taking meds but vomiting.   DVT prophylaxis: Heparin  Code Status: Full  Family Communication: None at bedside   Disposition Plan: Home Consults called: Consider GI  Admission status:  Inpatient.  Para Skeans MD [facility] If 7PM-7AM, please contact night-coverage [email or web address] Password [email or web address]  05/12/2019, 2:21 PM

## 2019-05-14 NOTE — Assessment & Plan Note (Signed)
Amiodarone 200 mg daily x not taken because he cannot swallow.   Pt is on eliquis 5 bid.

## 2019-05-14 NOTE — Assessment & Plan Note (Addendum)
Pt states his bp meds were put on hold because of the thinner.

## 2019-05-14 NOTE — Assessment & Plan Note (Signed)
Pt has been taking meds but vomiting.

## 2019-05-14 NOTE — Progress Notes (Signed)
ANTICOAGULATION CONSULT NOTE - Initial Consult  Pharmacy Consult for  Heparin  Indication: atrial fibrillation  Allergies  Allergen Reactions  . Bee Venom Anaphylaxis  . Feldene [Piroxicam] Hives    Patient Measurements: Height: 5\' 8"  (172.7 cm) Weight: 147 lb 12 oz (67 kg) IBW/kg (Calculated) : 68.4 Heparin Dosing Weight:  67 kg   Vital Signs: Temp: 97.7 F (36.5 C) (01/24 1005) Temp Source: Oral (01/24 1005) BP: 137/82 (01/24 1430) Pulse Rate: 75 (01/24 1430)  Labs: Recent Labs    05/02/2019 1120 04/30/2019 1304  HGB 13.4  --   HCT 43.4  --   PLT 286  --   CREATININE 1.36*  --   TROPONINIHS 240* 249*    Estimated Creatinine Clearance: 42.4 mL/min (A) (by C-G formula based on SCr of 1.36 mg/dL (H)).   Medical History: Past Medical History:  Diagnosis Date  . Agent orange exposure   . Asthma    exercise induced asthma   . COPD (chronic obstructive pulmonary disease) ([place])   . DJD (degenerative joint disease)    a. 02/2019 s/p L TKA.  Marland Kitchen GERD (gastroesophageal reflux disease)   . Hearing loss    wears hearing aids  . History of cardiac cath    a. History of 2 cardiac catheterizations, last ~ 10 yrs ago @ [facility]-->reportedly nl.  . History of stress test    a. 2020 - pharmacologic stress testing @ [facility]-->reportedly nl.  . Hyperlipidemia   . Hypertension   . Hypothyroidism   . Insomnia   . Lung cancer ([place])    a. Dx 03/2019.  Marland Kitchen Pericardial effusion    a. 03/2019 Echo: EF 60-65%, no rwma, nl RV fxn. Mildly dil LA. Mod pericardia eff w/o tamponade. Mild Ao sclerosis.  . Prostate cancer ([place])   . PTSD (post-traumatic stress disorder)   . Seasonal allergies   . Thyroid disease     Medications:  (Not in a hospital admission)   Assessment: Pharmacy consulted to dose heparin for AFib in this 79 year old male who is NPO.  Was on Eliquis PTA. Per RN, last dose was ~ 4 days ago.  CrCl = 42.4 ml/min  Goal of Therapy:  Heparin level 0.3-0.7 units/ml Monitor  platelets by anticoagulation protocol: Yes   Plan:  Will order baseline aPTT and INR.  Give 4000 units bolus x 1 Start heparin infusion at 1000 units/hr Check anti-Xa level in 8 hours and daily while on heparin Continue to monitor H&H and platelets  Adriane Guglielmo D 05/04/2019,3:01 PM

## 2019-05-14 NOTE — ED Notes (Signed)
Date and time results received: 04/22/2019 1216 (use smartphrase ".now" to insert current time)  Test: trop Critical Value: 240  Name of Provider Notified: Jari Pigg  Orders Received? Or Actions Taken?:

## 2019-05-15 ENCOUNTER — Inpatient Hospital Stay: Payer: Medicare Other

## 2019-05-15 DIAGNOSIS — I480 Paroxysmal atrial fibrillation: Secondary | ICD-10-CM

## 2019-05-15 DIAGNOSIS — C3491 Malignant neoplasm of unspecified part of right bronchus or lung: Secondary | ICD-10-CM

## 2019-05-15 DIAGNOSIS — R1310 Dysphagia, unspecified: Secondary | ICD-10-CM

## 2019-05-15 DIAGNOSIS — I10 Essential (primary) hypertension: Secondary | ICD-10-CM

## 2019-05-15 DIAGNOSIS — R112 Nausea with vomiting, unspecified: Secondary | ICD-10-CM

## 2019-05-15 LAB — APTT
aPTT: 141 seconds — ABNORMAL HIGH (ref 24–36)
aPTT: 79 seconds — ABNORMAL HIGH (ref 24–36)
aPTT: 87 seconds — ABNORMAL HIGH (ref 24–36)

## 2019-05-15 LAB — CBC
HCT: 37.1 % — ABNORMAL LOW (ref 39.0–52.0)
Hemoglobin: 11.5 g/dL — ABNORMAL LOW (ref 13.0–17.0)
MCH: 30.1 pg (ref 26.0–34.0)
MCHC: 31 g/dL (ref 30.0–36.0)
MCV: 97.1 fL (ref 80.0–100.0)
Platelets: 244 10*3/uL (ref 150–400)
RBC: 3.82 MIL/uL — ABNORMAL LOW (ref 4.22–5.81)
RDW: 16.4 % — ABNORMAL HIGH (ref 11.5–15.5)
WBC: 4.8 10*3/uL (ref 4.0–10.5)
nRBC: 0 % (ref 0.0–0.2)

## 2019-05-15 LAB — HEPARIN LEVEL (UNFRACTIONATED)
Heparin Unfractionated: 1.32 IU/mL — ABNORMAL HIGH (ref 0.30–0.70)
Heparin Unfractionated: 1.7 IU/mL — ABNORMAL HIGH (ref 0.30–0.70)

## 2019-05-15 MED ORDER — DEXTROSE-NACL 5-0.45 % IV SOLN: INTRAVENOUS | Status: DC

## 2019-05-15 MED ORDER — HEPARIN (PORCINE) 25000 UT/250ML-% IV SOLN
850.0000 [IU]/h | INTRAVENOUS | Status: AC
  Administered 2019-05-15 – 2019-05-16 (×2): 750 [IU]/h via INTRAVENOUS
  Administered 2019-05-17 – 2019-05-18 (×2): 850 [IU]/h via INTRAVENOUS
  Filled 2019-05-15 (×3): qty 250

## 2019-05-15 MED ORDER — MENTHOL 3 MG MT LOZG
1.0000 | LOZENGE | OROMUCOSAL | Status: DC | PRN
  Filled 2019-05-15: qty 9

## 2019-05-15 NOTE — Progress Notes (Addendum)
PROGRESS NOTE    Carlos Barrera  DGL:[id number] DOB: [birth year] DOA: 04/22/2019 PCP: [facility]    Brief Narrative:  Carlos Haisley Howlandis a 79 y.o.malewith a hx of 79 y.o.malewith a hx of atrial fibrillation, COPD, HTN, HLD, agent orange exposure, lung cancer, remote prostate cancer, hypothyroidism, remote tobacco abuse (40 pack years, quit 1982)presented with dysphagia of projectile vomiting.   Consultants:   GI  Procedures: Barium swallow 1. Examination limited as detailed above. Initial swallows of barium are seen to be grossly retained at the midesophagus with tight, strictured appearing caliber of the esophagus at the level of the left mainstem bronchus. 2. No evidence of penetration or aspiration. Antimicrobials:   None   Subjective: Patient unable to tolerate any feeding.  No vomiting today.  No abdominal pain.  Asking to see his oncologist for chemo  Objective: Vitals:   05/19/2019 1659 05/15/19 0010 05/15/19 0010 05/15/19 0017  BP: 139/74 (!) 118/59 (!) 118/59 (!) 118/59  Pulse: 70 67 67 67  Resp: 16 17 17 17   Temp: 97.9 F (36.6 C) 97.9 F (36.6 C) 97.9 F (36.6 C) 97.9 F (36.6 C)  TempSrc: Oral Oral Oral Oral  SpO2: 97% 95% 95% 100%  Weight:      Height:        Intake/Output Summary (Last 24 hours) at 05/15/2019 0759 Last data filed at 05/15/2019 0500 Gross per 24 hour  Intake 2000 ml  Output 200 ml  Net 1800 ml   Filed Weights   04/30/2019 1006  Weight: 67 kg    Examination:  General exam: Appears calm and comfortable, NAD Respiratory system: Clear to auscultation. Respiratory effort normal. Cardiovascular system: S1 & S2 heard, RRR. No murmurs, rubs, gallops or clicks.  Gastrointestinal system: Abdomen is nondNo organomegaly or masses felt. Normal bowel sounds heard. Central nervous system: Alert and oriented. No focal neurological deficits. Extremities: No edema Skin: Warm dry Psychiatry: Judgement and insight appear normal.  Mood & affect appropriate.     Data Reviewed: I have personally reviewed following labs and imaging studies  CBC: Recent Labs  Lab 05/04/2019 1120  WBC 5.2  HGB 13.4  HCT 43.4  MCV 95.6  PLT 518   Basic Metabolic Panel: Recent Labs  Lab 05/15/2019 1120  NA 143  K 4.0  CL 106  CO2 27  GLUCOSE 97  BUN 21  CREATININE 1.36*  CALCIUM 10.8*   GFR: Estimated Creatinine Clearance: 42.4 mL/min (A) (by C-G formula based on SCr of 1.36 mg/dL (H)). Liver Function Tests: Recent Labs  Lab 05/21/2019 1120  AST 18  ALT 10  ALKPHOS 86  BILITOT 0.7  PROT 7.3  ALBUMIN 3.6   Recent Labs  Lab 05/03/2019 1120  LIPASE 29   No results for input(s): AMMONIA in the last 168 hours. Coagulation Profile: Recent Labs  Lab 05/19/2019 1515  INR 1.3*   Cardiac Enzymes: No results for input(s): CKTOTAL, CKMB, CKMBINDEX, TROPONINI in the last 168 hours. BNP (last 3 results) No results for input(s): PROBNP in the last 8760 hours. HbA1C: No results for input(s): HGBA1C in the last 72 hours. CBG: Recent Labs  Lab 05/09/19 1051  GLUCAP 103*   Lipid Profile: No results for input(s): CHOL, HDL, LDLCALC, TRIG, CHOLHDL, LDLDIRECT in the last 72 hours. Thyroid Function Tests: No results for input(s): TSH, T4TOTAL, FREET4, T3FREE, THYROIDAB in the last 72 hours. Anemia Panel: No results for input(s): VITAMINB12, FOLATE, FERRITIN, TIBC, IRON, RETICCTPCT in the last 72 hours. Sepsis  Labs: No results for input(s): PROCALCITON, LATICACIDVEN in the last 168 hours.  Recent Results (from the past 240 hour(s))  Respiratory Panel by RT PCR (Flu A&B, Covid) - Nasopharyngeal Swab     Status: None   Collection Time: 04/24/2019 11:20 AM   Specimen: Nasopharyngeal Swab  Result Value Ref Range Status   SARS Coronavirus 2 by RT PCR NEGATIVE NEGATIVE Final    Comment: (NOTE) SARS-CoV-2 target nucleic acids are NOT DETECTED. The SARS-CoV-2 RNA is generally detectable in upper respiratoy specimens during the  acute phase of infection. The lowest concentration of SARS-CoV-2 viral copies this assay can detect is 131 copies/mL. A negative result does not preclude SARS-Cov-2 infection and should not be used as the sole basis for treatment or other patient management decisions. A negative result may occur with  improper specimen collection/handling, submission of specimen other than nasopharyngeal swab, presence of viral mutation(s) within the areas targeted by this assay, and inadequate number of viral copies (<131 copies/mL). A negative result must be combined with clinical observations, patient history, and epidemiological information. The expected result is Negative. Fact Sheet for Patients:  PinkCheek.be Fact Sheet for Healthcare Providers:  GravelBags.it This test is not yet ap proved or cleared by the [place] FDA and  has been authorized for detection and/or diagnosis of SARS-CoV-2 by FDA under an Emergency Use Authorization (EUA). This EUA will remain  in effect (meaning this test can be used) for the duration of the COVID-19 declaration under Section 564(b)(1) of the Act, 21 U.S.C. section 360bbb-3(b)(1), unless the authorization is terminated or revoked sooner.    Influenza A by PCR NEGATIVE NEGATIVE Final   Influenza B by PCR NEGATIVE NEGATIVE Final    Comment: (NOTE) The Xpert Xpress SARS-CoV-2/FLU/RSV assay is intended as an aid in  the diagnosis of influenza from Nasopharyngeal swab specimens and  should not be used as a sole basis for treatment. Nasal washings and  aspirates are unacceptable for Xpert Xpress SARS-CoV-2/FLU/RSV  testing. Fact Sheet for Patients: PinkCheek.be Fact Sheet for Healthcare Providers: GravelBags.it This test is not yet approved or cleared by the [place] FDA and  has been authorized for detection and/or diagnosis of SARS-CoV-2  by  FDA under an Emergency Use Authorization (EUA). This EUA will remain  in effect (meaning this test can be used) for the duration of the  Covid-19 declaration under Section 564(b)(1) of the Act, 21  U.S.C. section 360bbb-3(b)(1), unless the authorization is  terminated or revoked. Performed at [facility], [address], [place], [place] [address]          Radiology Studies: DG Chest Portable 1 View  Result Date: 05/10/2019 CLINICAL DATA:  Nausea, vomiting and weakness for 2 weeks. History of lung carcinoma. EXAM: PORTABLE CHEST 1 VIEW COMPARISON:  PA and lateral chest 04/27/2019.  CT chest 04/15/2019. FINDINGS: The lungs are emphysematous but clear. There is some volume loss in the right chest. No pneumothorax or pleural effusion. Heart size is normal. Fullness of the right hilum is consistent with lymphadenopathy seen on prior CT. IMPRESSION: No acute disease. Emphysema. Right hilar fullness consistent with lymphadenopathy seen on prior CT. Electronically Signed   By: Inge Rise M.D.   On: 05/19/2019 10:56        Scheduled Meds: Continuous Infusions: . heparin 750 Units/hr (05/15/19 0235)    Assessment & Plan:   Active Problems:   Adenocarcinoma of right lung (HCC)   Paroxysmal atrial fibrillation (HCC)   Intractable vomiting  Pericardial effusion   Hypertension   Dysphagia   #1 dysphagia and projectile vomiting Barium swallow with midesophagus with tight stricture please see full report Consulted plan for EGD with possible dilation in a.m. Heparin 6 hours prior to procedure, and stay off of heparin for 24 hours. N.p.o. at midnight tonight  2.essent HTN-stable  3.  PAF-on amiodarone.  On IV as p.o. is held due to his dysphagia.  On IV heparin for anticoagulation which will be stopped 6 hours prior to procedure and kept off for 24 hours post.  #4 right lung adenocarcinoma Patient wants his oncologist to be consulted, I have let Dr. Grayland Ormond  know that patient is here and he will need a port apparently for chemotherapy to be done as outpatient  #5 AKI-prerenal from decreased p.o. intake and projectile vomiting Continue on IV fluids since n.p.o. Check a.m. labs   DVT prophylaxis:, Heparin then SCD Code Status: Full Family Communication: None Disposition Plan: Possible DC tomorrow or the next day  if stable from GI standpoint after EGD.        LOS: 1 day   Time spent: 45 minutes with more than 50% on [place], MD [facility] Pager [phone]  If 7PM-7AM, please contact night-coverage [email or web address] Password [facility] 05/15/2019, 7:59 AM

## 2019-05-15 NOTE — Consult Note (Signed)
[place]  [phone] [phone]) [phone]  ID: Carlos Barrera OB: [birth year]  MR#: [id number]  DGL#:[id number]  Patient Care Team: [facility] as PCP - General Rockey Situ Kathlene November, MD as PCP - Cardiology (Cardiology)  CHIEF COMPLAINT: Stage IV adenocarcinoma of the lung, esophageal stricture.  INTERVAL HISTORY: Patient is a 79 year old male who was recently diagnosed with stage IV adenocarcinoma of the lung with intention to receive his first chemotherapy on May 25, 2019.  He recently was admitted from with intractable nausea and vomiting and found to have an esophageal stricture.  He has chronic weakness and fatigue, but otherwise feels well.  He has no neurologic complaints.  He denies any chest pain, shortness of breath, cough, or hemoptysis.  He has no constipation or diarrhea.  He has no urinary complaints.  Patient offers no further specific complaints today.  REVIEW OF SYSTEMS:   Review of Systems  Constitutional: Positive for malaise/fatigue and weight loss. Negative for fever.  Respiratory: Negative.  Negative for cough and shortness of breath.   Cardiovascular: Negative.  Negative for chest pain and leg swelling.  Gastrointestinal: Positive for nausea and vomiting.  Genitourinary: Negative.  Negative for dysuria.  Musculoskeletal: Negative.   Skin: Negative.  Negative for rash.  Neurological: Positive for weakness. Negative for dizziness, focal weakness and headaches.    As per HPI. Otherwise, a complete review of systems is negative.  PAST MEDICAL HISTORY: Past Medical History:  Diagnosis Date  . Agent orange exposure   . Asthma    exercise induced asthma   . COPD (chronic obstructive pulmonary disease) ([place])   . DJD (degenerative joint disease)    a. 02/2019 s/p L TKA.  Marland Kitchen GERD (gastroesophageal reflux disease)   . Hearing loss    wears hearing aids  . History of cardiac cath    a. History of 2 cardiac  catheterizations, last ~ 10 yrs ago @ [facility]-->reportedly nl.  . History of stress test    a. 2020 - pharmacologic stress testing @ [facility]-->reportedly nl.  . Hyperlipidemia   . Hypertension   . Hypothyroidism   . Insomnia   . Lung cancer ([place])    a. Dx 03/2019.  Marland Kitchen Pericardial effusion    a. 03/2019 Echo: EF 60-65%, no rwma, nl RV fxn. Mildly dil LA. Mod pericardia eff w/o tamponade. Mild Ao sclerosis.  . Prostate cancer ([place])   . PTSD (post-traumatic stress disorder)   . Seasonal allergies   . Thyroid disease     PAST SURGICAL HISTORY: Past Surgical History:  Procedure Laterality Date  . BACK SURGERY     x 2  . COLONOSCOPY     hx polyps  . HERNIA REPAIR    . INCISE AND DRAIN ABCESS     patient denies this procedure 03/03/19  . NECK SURGERY     x 1  . PERICARDIOCENTESIS N/A 04/28/2019   Procedure: PERICARDIOCENTESIS;  Surgeon: Wellington Hampshire, MD;  Location: [facility];  Service: Cardiovascular;  Laterality: N/A;  . TONSILLECTOMY    . 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 TOTAL KNEE ARTHROPLASTY Left 03/09/2019   Procedure: LEFT TOTAL KNEE ARTHROPLASTY;  Surgeon: Meredith Pel, MD;  Location: [facility];  Service: Orthopedics;  Laterality: Left;  . WISDOM TOOTH EXTRACTION      FAMILY HISTORY: Family History  Problem Relation Age of Onset  . Breast cancer Mother        w/ brain mets -> died in  her 70's.  Marland Kitchen Heart attack Father        died @ 21  . Other Brother        multiple medical issues, pt unaware of specifics.    ADVANCED DIRECTIVES (Y/N):  @ADVDIR @  HEALTH MAINTENANCE: Social History   Tobacco Use  . Smoking status: Former Smoker    Packs/day: 2.00    Years: 20.00    Pack years: 40.00    Types: Cigarettes    Quit date: 04/20/1980    Years since quitting: 39.0  . Smokeless tobacco: Never Used  Substance Use Topics  . Alcohol use: No  . Drug use: No     Colonoscopy:  PAP:  Bone density:  Lipid panel:  Allergies  Allergen Reactions   . Bee Venom Anaphylaxis  . Feldene [Piroxicam] Hives    Current Facility-Administered Medications  Medication Dose Route Frequency Provider Last Rate Last Admin  . dextrose 5 %-0.45 % sodium chloride infusion   Intravenous Continuous Nolberto Hanlon, MD 75 mL/hr at 05/15/19 1435 New Bag at 05/15/19 1435  . heparin ADULT infusion 100 units/mL (25000 units/239mL sodium chloride 0.45%)  750 Units/hr Intravenous Continuous Nolberto Hanlon, MD 7.5 mL/hr at 05/15/19 0235 750 Units/hr at 05/15/19 0235  . menthol-cetylpyridinium (CEPACOL) lozenge 3 mg  1 lozenge Oral PRN Lang Snow, NP        OBJECTIVE: Vitals:   05/15/19 1150 05/15/19 1539  BP: 135/78 (!) 146/80  Pulse: 69 70  Resp:    Temp:  97.9 F (36.6 C)  SpO2: 98% 93%     Body mass index is 22.47 kg/m.    ECOG FS:1 - Symptomatic but completely ambulatory  General: Thin, no acute distress. Eyes: Pink conjunctiva, anicteric sclera. HEENT: Normocephalic, moist mucous membranes. Lungs: No audible wheezing or coughing. Heart: Regular rate and rhythm. Abdomen: Soft, nontender, no obvious distention. Musculoskeletal: No edema, cyanosis, or clubbing. Neuro: Alert, answering all questions appropriately. Cranial nerves grossly intact. Skin: No rashes or petechiae noted. Psych: Normal affect.  LAB RESULTS:  Lab Results  Component Value Date   NA 143 04/24/2019   K 4.0 05/05/2019   CL 106 05/05/2019   CO2 27 04/26/2019   GLUCOSE 97 04/27/2019   BUN 21 05/15/2019   CREATININE 1.36 (H) 04/22/2019   CALCIUM 10.8 (H) 05/04/2019   PROT 7.3 04/28/2019   ALBUMIN 3.6 04/22/2019   AST 18 05/21/2019   ALT 10 05/04/2019   ALKPHOS 86 04/30/2019   BILITOT 0.7 05/18/2019   GFRNONAA 49 (L) 05/15/2019   GFRAA 57 (L) 05/09/2019    Lab Results  Component Value Date   WBC 4.8 05/15/2019   NEUTROABS 5.7 04/15/2019   HGB 11.5 (L) 05/15/2019   HCT 37.1 (L) 05/15/2019   MCV 97.1 05/15/2019   PLT 244 05/15/2019     STUDIES: DG  Chest 2 View  Result Date: 04/27/2019 CLINICAL DATA:  Chest pressure shortness of breath for 1 week, recently diagnosed with lung cancer, getting PET scan tomorrow, former smoker, hypertension EXAM: CHEST - 2 VIEW COMPARISON:  04/17/2019 FINDINGS: Normal heart size, mediastinal contours, and pulmonary vascularity. Small RIGHT pleural effusion and basilar atelectasis. Remaining lungs clear. No LEFT pleural effusion, or evidence of pneumothorax. Bones demineralized with prior cervicothoracic fusion. IMPRESSION: Persistent small RIGHT pleural effusion and basilar atelectasis. Electronically Signed   By: Lavonia Dana M.D.   On: 04/27/2019 17:19   CT Angio Chest PE W and/or Wo Contrast  Result Date: 04/16/2019 CLINICAL DATA:  Shortness  of breath EXAM: CT ANGIOGRAPHY CHEST WITH CONTRAST TECHNIQUE: Multidetector CT imaging of the chest was performed using the standard protocol during bolus administration of intravenous contrast. Multiplanar CT image reconstructions and MIPs were obtained to evaluate the vascular anatomy. CONTRAST:  161mL OMNIPAQUE IOHEXOL 350 MG/ML SOLN COMPARISON:  None. FINDINGS: Cardiovascular: There is a optimal opacification of the pulmonary arteries. There is no central,segmental, or subsegmental filling defects within the pulmonary arteries. There is mild cardiomegaly. A small to moderate pericardial effusion is present. Mild scattered aortic atherosclerosis is seen. There is normal three-vessel brachiocephalic anatomy. Mediastinum/Nodes: Subcarinal adenopathy is seen measuring 2.2 cm in AP dimension. There is also a right hilar adenopathy which measures 1.5 cm in AP dimension. Thyroid gland, trachea, and esophagus demonstrate no significant findings. Lungs/Pleura: There is small peripheral patchy airspace opacity seen within the posterior right upper and lower lung. There is also tree-in-bud opacity seen in the periphery of the right lung base. There is a small right and trace left pleural  effusion. Mildly increased interstitial thickening seen within both upper lungs. Upper Abdomen: No acute abnormalities present in the visualized portions of the upper abdomen. Musculoskeletal: No chest wall abnormality. No acute or significant osseous findings. Review of the MIP images confirms the above findings. IMPRESSION: No central, segmental, or subsegmental pulmonary embolism. Small to moderate pericardial effusion Subcarinal and right hilar adenopathy which could be reactive. Patchy/ground-glass opacity in the posterior right lung which could be due to atelectasis and/or infectious etiology. Small right and trace left pleural effusion. Mildly increased interstitial thickening seen within both upper lungs likely due to mild interstitial edema. Aortic Atherosclerosis (ICD10-I70.0). Electronically Signed   By: Prudencio Pair M.D.   On: 04/16/2019 00:04   CT abd  Result Date: 04/22/2019 CLINICAL DATA:  Emesis since yesterday. New onset of atrial fibrillation. On blood thinners. Sensation of food stuck in esophagus. Lung and prostate cancer. EXAM: CT ABDOMEN AND PELVIS WITH CONTRAST TECHNIQUE: Multidetector CT imaging of the abdomen and pelvis was performed using the standard protocol following bolus administration of intravenous contrast. CONTRAST:  177mL OMNIPAQUE IOHEXOL 300 MG/ML  SOLN COMPARISON:  Plain film of the abdomen of earlier today. FINDINGS: Lower chest: Mild right base subsegmental atelectasis. Normal heart size with minimal pericardial fluid. Small right pleural effusion. Hepatobiliary: Normal liver. Normal gallbladder, without biliary ductal dilatation. Pancreas: Normal, without mass or ductal dilatation. Spleen: Normal in size, without focal abnormality. Adrenals/Urinary Tract: Normal left adrenal gland. Felt to arise exophytic off the right adrenal is a 2.1 x 1.5 cm nodule including on 29/2. When compared to the CT of the chest of 04/15/2019, this is low-density on that study, favoring an  adenoma. Normal kidneys, without hydronephrosis.  Normal urinary bladder. Stomach/Bowel: Normal stomach, without wall thickening. Colonic stool burden suggests constipation. Normal terminal ileum and appendix. Normal small bowel. Vascular/Lymphatic: Aortic and branch vessel atherosclerosis. No abdominopelvic adenopathy. Reproductive: Normal prostate. Other: No significant free fluid. Small bilateral fat containing inguinal hernias. Musculoskeletal: Lumbar spondylosis. Prior interbody fusion material at L4-5. IMPRESSION: 1. No acute process in the abdomen or pelvis. 2. Possible constipation. 3. Small right pleural effusion. 4. Right adrenal adenoma. 5. Aortic Atherosclerosis (ICD10-I70.0). 6. Small pericardial effusion. Electronically Signed   By: Abigail Miyamoto M.D.   On: 04/22/2019 16:50   CARDIAC CATHETERIZATION  Result Date: 04/28/2019 And successful pericardiocentesis due to inability to reach the fluid from the subxiphoid area.  The amount of effusion on echo did not appear large enough to be tapped from  this approach. Recommendations: We will obtain a repeat limited echocardiogram.  If there is significant posterior and apical fluid, a pericardial window might be the best option.  NM PET Image Initial (PI) Skull Base To Thigh  Result Date: 05/09/2019 CLINICAL DATA:  Initial treatment strategy for lung cancer. EXAM: NUCLEAR MEDICINE PET SKULL BASE TO THIGH TECHNIQUE: 7.86 mCi F-18 FDG was injected intravenously. Full-ring PET imaging was performed from the skull base to thigh after the radiotracer. CT data was obtained and used for attenuation correction and anatomic localization. Fasting blood glucose: 103 mg/dl COMPARISON:  CTs of 04/22/2019 and 04/15/2019 FINDINGS: Mediastinal blood pool activity: SUV max 3.5 Liver activity: SUV max 4.3 NECK: Right level V B lymph node with increased FDG uptake (SUVmax = 4.4) this measures 6 mm. Signs of skeletal metastases in the cervical spine and thoracic inlet  adenopathy, see below. Incidental CT findings: none CHEST: Diffuse hypermetabolic nodal enlargement throughout the chest involving supraclavicular nodes, superior mediastinal nodes, subcarinal nodes and right hilar nodal tissue. Small lymph nodes along the aorta in the left chest also show hypermetabolic activity. (Image 81, series 4) approximately 4.2 x 3.8 cm subcarinal nodal mass nearly contiguous with adjacent mediastinal nodal tissue (SUVmax = 25.4 similar FDG uptake within AP window lymph nodes. Soft tissue inseparable from adjacent bronchial structures. (image 67, series 4) 1.5 cm, short axis right paratracheal lymph node with intense metabolic activity (SUVmax = 20.1) ( (Image 60, series 4) high left superior mediastinal lymph node measuring 1.2 cm (SUVmax = 12.6) Incidental CT findings: Extensive soft tissue encasing central bronchial structures and displacing the esophagus. The esophagus above the mass is patulous suggesting mass effect or direct invasion or obstruction of the esophagus due to mediastinal tumor. Calcified coronary artery disease. Generalized aortic atherosclerosis. Small right-sided effusion larger than on the study of 04/15/2019. Signs of interstitial thickening at the right lung base. Left chest is clear. ABDOMEN/PELVIS: Intense hypermetabolic activity associated with right adrenal lesion. (Image 119, series 4) 2.3 x 1.5 cm (SUVmax = 17.7) no definite sign of hepatic, splenic, pancreatic or left adrenal activity. Handling of FDG by the kidneys is symmetric. Small right retroperitoneal lymph node (image 131, series 4) (SUVmax = 5.0) Incidental CT findings: Aortic atherosclerosis. No signs of aneurysm. SKELETON: Multifocal osseous metastases. Signs of spinal fusion in the cervical spine. Hypermetabolic lesion in the spinous process of C5. Also involvement of the lateral mass of C2 along with the lamina and spinous process. Left T3 lamina extending in the spinous process with metastatic  focus. Left parasternal metastasis in the sternal manubrium with 2 metastatic foci in the sternum. Lesions involving T7 vertebral body on the right, transverse process of T8 on the left, T12 vertebral body along the posterior cortex, L1 vertebral body along the right lateral aspect, L3 along the left lateral margin of the vertebral body, L4 in the transverse process, pedicle and spinous process, L5 in the transverse process and right lateral vertebral body at L5-S1. Lesion in the right posterior iliac bone, left iliac crest and right proximal femur as well. These lesions all showing a similar degree of FDG uptake. For example, the lesion at the T12 vertebral body (image 110, series 4 (SUVmax = 24.5) this lesion is lytic with vertebral destruction measuring 1.7 cm. Lesion the right L4 transverse process and pedicle (SUVmax = 16.1) Incidental CT findings: Spinal degenerative changes with evidence of spinal fusion. No signs of visible pathologic fracture. IMPRESSION: 1. 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 Electronically Signed   By: Zetta Bills M.D.   On: 05/09/2019 13:42   DG Chest Portable 1 View  Result Date: 05/04/2019 CLINICAL DATA:  Nausea, vomiting and weakness for 2 weeks. History of lung carcinoma. EXAM: PORTABLE CHEST 1 VIEW COMPARISON:  PA and lateral chest 04/27/2019.  CT chest 04/15/2019. FINDINGS: The lungs are emphysematous but clear. There is some volume loss in the right chest. No pneumothorax or pleural effusion. Heart size is normal. Fullness of the right hilum is consistent with lymphadenopathy seen on prior CT. IMPRESSION: No acute disease. Emphysema. Right hilar fullness consistent with lymphadenopathy seen on prior CT. Electronically Signed   By: Inge Rise M.D.   On: 04/23/2019 10:56   DG CHEST PORT 1 VIEW  Result Date: 04/17/2019 CLINICAL DATA:  Atrial fibrillation and shortness of breath. Evaluate for  pulmonary infiltrates. EXAM: PORTABLE CHEST 1 VIEW COMPARISON:  04/16/2019 FINDINGS: Heart size appears within normal limits. New small right pleural effusion identified. Mild asymmetric edema identified within the right mid and right lower lung. Left lung is clear. IMPRESSION: 1. Small right effusion with mild asymmetric edema in the right mid and right lower lung. Electronically Signed   By: Kerby Moors M.D.   On: 04/17/2019 09:27   DG Chest Portable 1 View  Result Date: 04/16/2019 CLINICAL DATA:  Shortness of breath EXAM: PORTABLE CHEST 1 VIEW COMPARISON:  None. FINDINGS: The heart size and mediastinal contours are within normal limits. There is mildly increased interstitial markings seen throughout both lungs. There is patchy airspace opacity at the periphery of the right lung base. The visualized skeletal structures are unremarkable. IMPRESSION: Mildly increased interstitial markings throughout both lungs with patchy airspace opacity in the right lung base. This could be due to asymmetric edema and/or infectious etiology. Electronically Signed   By: Prudencio Pair M.D.   On: 04/16/2019 00:39   DG Abd 2 Views  Result Date: 04/22/2019 CLINICAL DATA:  Abdominal pain EXAM: ABDOMEN - 2 VIEW COMPARISON:  None. FINDINGS: The bowel gas pattern is normal. Moderate volume of stool throughout the colon. There is no evidence of free air. No radio-opaque calculi or other significant radiographic abnormality is seen. IMPRESSION: Nonobstructive bowel gas pattern with moderate volume of stool throughout the colon. Electronically Signed   By: Davina Poke D.O.   On: 04/22/2019 13:30   ECHOCARDIOGRAM COMPLETE  Result Date: 04/16/2019   ECHOCARDIOGRAM REPORT   Patient Name:   Carlos Barrera Date of Exam: 04/16/2019 Medical Rec #:  [id number]       Height:       68.0 in Accession #:    [id number]      Weight:       154.8 lb Date of Birth:  Mar 12, 1941       BSA:          1.83 m Patient Age:    11 years        BP:            105/54 mmHg Patient Gender: M               HR:           74 bpm. Exam Location:  Inpatient Procedure: 2D Echo, Color Doppler and Cardiac Doppler Indications:    Atrial fibrillation  History:        Patient has no prior history of Echocardiogram examinations.  Lung and prostate cancer.  Sonographer:    Merrie Roof RDCS Referring Phys: [place]  1. Left ventricular ejection fraction, by visual estimation, is 60 to 65%. The left ventricle has normal function. There is no left ventricular hypertrophy.  2. The left ventricle has no regional wall motion abnormalities.  3. Global right ventricle has normal systolic function.The right ventricular size is normal. No increase in right ventricular wall thickness.  4. Left atrial size was mildly dilated.  5. Right atrial size was normal.  6. Moderate pericardial effusion.  7. The pericardial effusion is anterior to the right ventricle.  8. Mild mitral annular calcification.  9. The mitral valve is normal in structure. No evidence of mitral valve regurgitation. No evidence of mitral stenosis. 10. The tricuspid valve is normal in structure. 11. The aortic valve is normal in structure. Aortic valve regurgitation is not visualized. Mild aortic valve sclerosis without stenosis. 12. The pulmonic valve was normal in structure. Pulmonic valve regurgitation is not visualized. 13. The inferior vena cava is normal in size with greater than 50% respiratory variability, suggesting right atrial pressure of 3 mmHg. 14. No prior Echocardiogram. FINDINGS  Left Ventricle: Left ventricular ejection fraction, by visual estimation, is 60 to 65%. The left ventricle has normal function. The left ventricle has no regional wall motion abnormalities. There is no left ventricular hypertrophy. Left ventricular diastolic parameters were normal. Normal left atrial pressure. Right Ventricle: The right ventricular size is normal. No increase in right  ventricular wall thickness. Global RV systolic function is has normal systolic function. Left Atrium: Left atrial size was mildly dilated. Right Atrium: Right atrial size was normal in size Pericardium: A moderately sized pericardial effusion is present. The pericardial effusion is anterior to the right ventricle. There is no evidence of cardiac tamponade. Mitral Valve: The mitral valve is normal in structure. Mild mitral annular calcification. No evidence of mitral valve regurgitation. No evidence of mitral valve stenosis by observation. Tricuspid Valve: The tricuspid valve is normal in structure. Tricuspid valve regurgitation is not demonstrated. Aortic Valve: The aortic valve is normal in structure. Aortic valve regurgitation is not visualized. Mild aortic valve sclerosis is present, with no evidence of aortic valve stenosis. Pulmonic Valve: The pulmonic valve was normal in structure. Pulmonic valve regurgitation is not visualized. Pulmonic regurgitation is not visualized. Aorta: The aortic root, ascending aorta and aortic arch are all structurally normal, with no evidence of dilitation or obstruction. Venous: The inferior vena cava was not well visualized. The inferior vena cava is normal in size with greater than 50% respiratory variability, suggesting right atrial pressure of 3 mmHg. IAS/Shunts: No atrial level shunt detected by color flow Doppler. There is no evidence of a patent foramen ovale. No ventricular septal defect is seen or detected. There is no evidence of an atrial septal defect.  LEFT VENTRICLE PLAX 2D LVIDd:         4.35 cm       Diastology LVIDs:         2.82 cm       LV e' lateral:   7.83 cm/s LV PW:         1.13 cm       LV E/e' lateral: 8.7 LV IVS:        1.04 cm       LV e' medial:    8.38 cm/s LVOT diam:     1.90 cm       LV E/e' medial:  8.1 LV SV:         55 ml LV SV Index:   30.05 LVOT Area:     2.84 cm  LV Volumes (MOD) LV area d, A4C:    24.20 cm LV area s, A4C:    12.20 cm LV  major d, A4C:   7.25 cm LV major s, A4C:   5.94 cm LV vol d, MOD A4C: 66.1 ml LV vol s, MOD A4C: 20.9 ml LV SV MOD A4C:     66.1 ml RIGHT VENTRICLE RV Basal diam:  3.54 cm LEFT ATRIUM             Index       RIGHT ATRIUM           Index LA diam:        4.20 cm 2.29 cm/m  RA Area:     12.80 cm LA Vol (A2C):   42.1 ml 22.97 ml/m RA Volume:   27.60 ml  15.06 ml/m LA Vol (A4C):   51.6 ml 28.15 ml/m LA Biplane Vol: 50.0 ml 27.28 ml/m  AORTIC VALVE LVOT Vmax:   71.10 cm/s LVOT Vmean:  47.800 cm/s LVOT VTI:    0.138 m  AORTA Ao Root diam: 3.60 cm Ao Asc diam:  3.60 cm MITRAL VALVE MV Area (PHT): 4.29 cm             SHUNTS MV PHT:        51.33 msec           Systemic VTI:  0.14 m MV Decel Time: 177 msec             Systemic Diam: 1.90 cm MV E velocity: 68.10 cm/s 103 cm/s MV A velocity: 56.10 cm/s 70.3 cm/s MV E/A ratio:  1.21       1.5  Mihai Croitoru MD Electronically signed by Sanda Klein MD Signature Date/Time: 04/16/2019/5:15:08 PM    Final    ECHOCARDIOGRAM LIMITED  Result Date: 05/10/2019   ECHOCARDIOGRAM LIMITED REPORT   Patient Name:   Carlos Barrera Date of Exam: 05/10/2019 Medical Rec #:  [id number]       Height:       68.0 in Accession #:    [id number]      Weight:       158.0 lb Date of Birth:  1940/06/11       BSA:          1.85 m Patient Age:    33 years        BP:           112/76 mmHg Patient Gender: M               HR:           74 bpm. Exam Location:  [facility]  Procedure: Limited Echo, Color Doppler and Cardiac Doppler Indications:     Pericardial Effusion 423.9  History:         Patient has prior history of Echocardiogram examinations.                  Pericardial Effusion.  Sonographer:     Sherrie Sport RDCS (AE) Referring Phys:  [place] Diagnosing Phys: Kate Sable MD IMPRESSIONS  1. Left ventricular ejection fraction, by visual estimation, is 55 to 60%. The left ventricle has normal function.  2. Left ventricular diastolic parameters are consistent with Grade II  diastolic dysfunction (pseudonormalization).  3. The left ventricle has  no regional wall motion abnormalities.  4. Global right ventricle has normal systolc function.The right ventricular size is normal.  5. Moderate pericardial effusion.  6. The pericardial effusion is anterior to the right ventricle, localized near the right ventricle and surrounding the apex.  7. Mild mitral annular calcification.  8. The mitral valve is degenerative.  9. The tricuspid valve was grossly normal. 10. Mild aortic valve sclerosis without stenosis. 11. The inferior vena cava is normal in size with greater than 50% respiratory variability, suggesting right atrial pressure of 3 mmHg. FINDINGS  Left Ventricle: Left ventricular ejection fraction, by visual estimation, is 55 to 60%. The left ventricle has normal function. The left ventricle has no regional wall motion abnormalities. Left ventricular diastolic parameters are consistent with Grade  II diastolic dysfunction (pseudonormalization). Right Ventricle: The right ventricular size is normal. Global RV systolic function is has normal systolic function. Left Atrium: Left atrial size was normal in size. Right Atrium: Right atrial size was normal in size. Right atrial pressure is estimated at 3 mmHg. Pericardium: A moderately sized pericardial effusion is present is seen. A moderately sized pericardial effusion is present. The pericardial effusion is anterior to the right ventricle, localized near the right ventricle and surrounding the apex. Mitral Valve: The mitral valve is degenerative in appearance. Mild mitral annular calcification. MV Area by PHT, 2.11 cm. MV PHT, 104.11 msec. Tricuspid Valve: The tricuspid valve is grossly normal. Aortic Valve: The aortic valve is tricuspid. Mild aortic valve sclerosis is present, with no evidence of aortic valve stenosis. Aorta: The aortic root is normal in size and structure. Venous: The inferior vena cava is normal in size with greater than  50% respiratory variability, suggesting right atrial pressure of 3 mmHg.  LEFT VENTRICLE          Normals PLAX 2D LVIDd:         4.07 cm  3.6 cm   Diastology                 Normals LVIDs:         2.49 cm  1.7 cm   LV e' lateral:   2.50 cm/s 6.42 cm/s LV PW:         1.27 cm  1.4 cm   LV E/e' lateral: 20.4      15.4 LV IVS:        1.48 cm  1.3 cm   LV e' medial:    3.92 cm/s 6.96 cm/s LVOT diam:     2.10 cm  2.0 cm   LV E/e' medial:  13.0      6.96 LV SV:         51 ml    79 ml LV SV Index:   27.33    45 ml/m2 LVOT Area:     3.46 cm 3.14 cm2  RIGHT VENTRICLE RV S prime:     16.80 cm/s TAPSE (M-mode): 3.1 cm LEFT ATRIUM             Index       RIGHT ATRIUM           Index LA diam:        3.10 cm 1.68 cm/m  RA Area:     10.00 cm LA Vol (A2C):   27.2 ml 14.71 ml/m RA Volume:   17.70 ml  9.57 ml/m LA Vol (A4C):   43.7 ml 23.64 ml/m LA Biplane Vol: 36.8 ml 19.90 ml/m  PULMONIC VALVE           Normals AORTA                 Normals PV Vmax:        0.63 m/s Ao Root diam: 3.70 cm 31 mm   PV Peak grad:   1.6 mmHg                               RVOT Peak grad: 2 mmHg  MITRAL VALVE               Normals MV Area (PHT): 2.11 cm             SHUNTS MV PHT:        104.11 msec 55 ms    Systemic Diam: 2.10 cm MV Decel Time: 359 msec    187 ms MV E velocity: 50.90 cm/s 103 cm/s MV A velocity: 83.40 cm/s 70.3 cm/s MV E/A ratio:  0.61       1.5  Kate Sable MD Electronically signed by Kate Sable MD Signature Date/Time: 05/10/2019/1:53:16 PMThe mitral valve is degenerative in appearance.    Final    ECHOCARDIOGRAM LIMITED  Result Date: 04/28/2019   ECHOCARDIOGRAM LIMITED REPORT   Patient Name:   Carlos Barrera Date of Exam: 04/28/2019 Medical Rec #:  [id number]       Height:       68.0 in Accession #:    [id number]      Weight:       158.0 lb Date of Birth:  [birth year]       BSA:          1.85 m Patient Age:    54 years        BP:           106/84 mmHg Patient Gender: M               HR:            76 bpm. Exam Location:  [facility]  Procedure: Limited Echo, Color Doppler and Cardiac Doppler Indications:     Pericardial effusion 423.9  History:         Patient has prior history of Echocardiogram examinations, most                  recent 04/16/2019. Pericardial Effusion.  Sonographer:     Sherrie Sport RDCS (AE) Referring Phys:  [place] Diagnosing Phys: Ida Rogue MD IMPRESSIONS  1. Left ventricular ejection fraction, by visual estimation, is 60 to 65%. The left ventricle has normal function. There is no increased left ventricular wall thickness.  2. Global right ventricle has normal systolc function.The right ventricular size is normal. no increase in right ventricular wall thickness.  3. Moderate pericardial effusion, predominantly around the RV free wall, estimated 1.4 to 1.8 cm, Trace outside the LV free wall, no evidence of tamponade.  4. Tricuspid valve regurgitation is mild.  5. Normal pulmonary artery systolic pressure. FINDINGS  Left Ventricle: Left ventricular ejection fraction, by visual estimation, is 60 to 65%. The left ventricle has normal function. There is no increased left ventricular wall thickness. Normal left atrial pressure. Right Ventricle: The right ventricular size is normal. No increase in right ventricular wall thickness. Global RV systolic function is has normal systolic function. Left Atrium: Left atrial size was normal in size. Right Atrium: Right atrial size was  normal in size. Right atrial pressure is estimated at 10 mmHg. Pericardium: A moderately sized pericardial effusion is present is seen. A moderately sized pericardial effusion is present. Mitral Valve: The mitral valve is normal in structure. No evidence of mitral valve stenosis by observation. No evidence of mitral valve regurgitation. Tricuspid Valve: The tricuspid valve is normal in structure. Tricuspid valve regurgitation is mild. Aortic Valve: The aortic valve is normal in structure. Aortic valve  regurgitation is not visualized. The aortic valve is structurally normal, with no evidence of sclerosis or stenosis. Pulmonic Valve: The pulmonic valve was normal in structure. Pulmonic valve regurgitation is not visualized by color flow Doppler. Pulmonic regurgitation is not visualized by color flow Doppler. Aorta: The aortic root, ascending aorta and aortic arch are all structurally normal, with no evidence of dilitation or obstruction. Venous: The inferior vena cava is normal in size with greater than 50% respiratory variability, suggesting right atrial pressure of 3 mmHg. Shunts: There is no evidence of a patent foramen ovale. No ventricular septal defect is seen or detected. There is no evidence of an atrial septal defect. No atrial level shunt detected by color flow Doppler.  LEFT VENTRICLE          Normals PLAX 2D LVIDd:         3.68 cm  3.6 cm LVIDs:         2.39 cm  1.7 cm LV PW:         1.34 cm  1.4 cm LV IVS:        0.77 cm  1.3 cm LVOT diam:     2.00 cm  2.0 cm LV SV:         37 ml    79 ml LV SV Index:   20.12    45 ml/m2 LVOT Area:     3.14 cm 3.14 cm2  RIGHT VENTRICLE RV Basal diam:  4.02 cm LEFT ATRIUM         Index LA diam:    3.40 cm 1.84 cm/m   AORTA                 Normals Ao Root diam: 3.30 cm 31 mm  SHUNTS Systemic Diam: 2.00 cm  Ida Rogue MD Electronically signed by Ida Rogue MD Signature Date/Time: 04/28/2019/3:20:35 PMThe mitral valve is normal in structure.    Final    CT OUTSIDE FILMS CHEST  Result Date: 05/02/2019 This examination belongs to an outside facility and is stored here for comparison purposes only.  Contact the originating outside institution for any associated report or interpretation.  CT OUTSIDE FILMS CHEST  Result Date: 05/02/2019 This examination belongs to an outside facility and is stored here for comparison purposes only.  Contact the originating outside institution for any associated report or interpretation.  DG ESOPHAGUS W SINGLE CM (SOL OR THIN  BA)  Result Date: 05/15/2019 CLINICAL DATA:  Dysphagia EXAM: ESOPHOGRAM/BARIUM SWALLOW TECHNIQUE: Single contrast examination was performed using  thick barium. FLUOROSCOPY TIME:  Fluoroscopy Time:  00:30 Number of Acquired Spot Images: 25 COMPARISON:  PET-CT, 05/09/2019 FINDINGS: Single contrast barium swallow examination was performed under fluoroscopy. Examination is generally limited by patient nausea and intolerance of barium. There is no evidence of penetration or aspiration on initial oropharyngeal phase of swallow. Initial swallows of barium are seen to be grossly retained at the midesophagus with tight, strictured appearing caliber of the esophagus at the level of the left mainstem bronchus. Examination was ended at this point  due to identification of obstruction and patient inability to tolerate further oral ingestion. The stomach and proximal small bowel are not assessed. IMPRESSION: 1. Examination limited as detailed above. Initial swallows of barium are seen to be grossly retained at the midesophagus with tight, strictured appearing caliber of the esophagus at the level of the left mainstem bronchus. 2. No evidence of penetration or aspiration. Electronically Signed   By: Eddie Candle M.D.   On: 05/15/2019 09:06    ASSESSMENT: Stage IV adenocarcinoma of the lung, esophageal stricture.  PLAN:    1. Stage IV adenocarcinoma of the lung: Patient had biopsy at the [facility] on April 05, 2019 confirming a poorly differentiated adenocarcinoma. PET scan results from May 09, 2019 reviewed independently and reported as above with widespread bony metastatic disease.  Patient has agreed to port placement and palliative chemotherapy using carboplatinum, pemetrexed, and Avastin every 3 weeks.    His first dose is scheduled for May 25, 2019.  Patient anticoagulation is on hold for his EGD tomorrow, it would also be beneficial if patient could have his port placement during this admission to decrease  travel and additional time off anticoagulation. 2. Esophageal stricture: Appreciate GI input.  EGD with possible dilation tomorrow. 3. Atrial fibrillation/cardiac disease: Eliquis and Aggrenox on hold.  Patient currently on heparin drip.  Recommend port placement during this admission.   Will Follow  Lloyd Huger, MD   05/15/2019 5:27 PM

## 2019-05-15 NOTE — Progress Notes (Signed)
ANTICOAGULATION CONSULT NOTE - Initial Consult  Pharmacy Consult for  Heparin  Indication: atrial fibrillation  Allergies  Allergen Reactions  . Bee Venom Anaphylaxis  . Feldene [Piroxicam] Hives    Patient Measurements: Height: 5\' 8"  (172.7 cm) Weight: 147 lb 12 oz (67 kg) IBW/kg (Calculated) : 68.4 Heparin Dosing Weight:  67 kg   Vital Signs: Temp: 97.9 F (36.6 C) (01/25 1539) Temp Source: Oral (01/25 1539) BP: 146/80 (01/25 1539) Pulse Rate: 70 (01/25 1539)  Labs: Recent Labs    05/15/2019 1120 04/29/2019 1304 05/19/2019 1515 04/30/2019 1515 05/15/19 0004 05/15/19 1032 05/15/19 1704  HGB 13.4  --   --   --   --  11.5*  --   HCT 43.4  --   --   --   --  37.1*  --   PLT 286  --   --   --   --  244  --   APTT  --   --  28   < > 141* 79* 87*  LABPROT  --   --  15.6*  --   --   --   --   INR  --   --  1.3*  --   --   --   --   HEPARINUNFRC  --   --   --   --  1.70* 1.32*  --   CREATININE 1.36*  --   --   --   --   --   --   TROPONINIHS 240* 249*  --   --   --   --   --    < > = values in this interval not displayed.    Estimated Creatinine Clearance: 42.4 mL/min (A) (by C-G formula based on SCr of 1.36 mg/dL (H)).   Medical History: Past Medical History:  Diagnosis Date  . Agent orange exposure   . Asthma    exercise induced asthma   . COPD (chronic obstructive pulmonary disease) ([place])   . DJD (degenerative joint disease)    a. 02/2019 s/p L TKA.  Marland Kitchen GERD (gastroesophageal reflux disease)   . Hearing loss    wears hearing aids  . History of cardiac cath    a. History of 2 cardiac catheterizations, last ~ 10 yrs ago @ [facility]-->reportedly nl.  . History of stress test    a. 2020 - pharmacologic stress testing @ [facility]-->reportedly nl.  . Hyperlipidemia   . Hypertension   . Hypothyroidism   . Insomnia   . Lung cancer ([place])    a. Dx 03/2019.  Marland Kitchen Pericardial effusion    a. 03/2019 Echo: EF 60-65%, no rwma, nl RV fxn. Mildly dil LA. Mod pericardia eff w/o tamponade.  Mild Ao sclerosis.  . Prostate cancer ([place])   . PTSD (post-traumatic stress disorder)   . Seasonal allergies   . Thyroid disease     Medications:  Facility-Administered Medications Prior to Admission  Medication Dose Route Frequency Provider Last Rate Last Admin  . Leuprolide Acetate (6 Month) (LUPRON) injection 45 mg  45 mg Intramuscular Once Stoioff, Scott C, MD       Medications Prior to Admission  Medication Sig Dispense Refill Last Dose  . amiodarone (PACERONE) 200 MG tablet Take 1 tablet (200 mg total) by mouth 2 (two) times daily. 60 tablet 0 Past Month at Unknown time  . apixaban (ELIQUIS) 5 MG TABS tablet Take 1 tablet (5 mg total) by mouth 2 (two) times daily. 60 tablet 0  Past Month at Unknown time  . cetirizine (ZYRTEC) 10 MG tablet Take 10 mg by mouth daily.    Past Month at Unknown time  . folic acid (FOLVITE) 1 MG tablet Take 1 mg by mouth daily.   Past Month at Unknown time  . folic acid (FOLVITE) 1 MG tablet Take 1 tablet (1 mg total) by mouth daily. Start 5-7 days before Alimta chemotherapy. Continue until 21 days after Alimta completed. 100 tablet 3 Past Month at Unknown time  . levothyroxine (SYNTHROID) 88 MCG tablet Take 88 mcg by mouth daily before breakfast.   Past Month at Unknown time  . megestrol (MEGACE) 40 MG tablet Take 1 tablet (40 mg total) by mouth daily. 30 tablet 1 Past Month at Unknown time  . ondansetron (ZOFRAN) 8 MG tablet Take 1 tablet (8 mg total) by mouth 2 (two) times daily as needed for refractory nausea / vomiting. 60 tablet 2 Past Month at Unknown time  . pravastatin (PRAVACHOL) 20 MG tablet Take 20 mg by mouth at bedtime.   Past Month at Unknown time  . prazosin (MINIPRESS) 5 MG capsule Take 10 mg by mouth at bedtime.    Past Month at Unknown time  . prochlorperazine (COMPAZINE) 10 MG tablet Take 1 tablet (10 mg total) by mouth every 6 (six) hours as needed (Nausea or vomiting). 60 tablet 2 Past Month at Unknown time  . REFRESH TEARS 0.5 % SOLN  Place 1 drop into both eyes 2 (two) times daily.    Past Month at prn  . sertraline (ZOLOFT) 50 MG tablet Take 50 mg by mouth at bedtime.    Past Month at Unknown time  . tamsulosin (FLOMAX) 0.4 MG CAPS capsule Take 0.4 mg by mouth daily.    Past Month at Unknown time  . traZODone (DESYREL) 50 MG tablet Take 50 mg by mouth at bedtime.   Past Month at Unknown time  . alum & mag hydroxide-simeth (MAALOX/MYLANTA) 200-200-20 MG/5ML suspension Take 30 mLs by mouth every 6 (six) hours as needed for indigestion or heartburn. 355 mL 0 prn at prn  . lidocaine-prilocaine (EMLA) cream Apply to affected area once 30 g 3 prn at prn  . ondansetron (ZOFRAN ODT) 4 MG disintegrating tablet Take 1 tablet (4 mg total) by mouth every 8 (eight) hours as needed for nausea or vomiting. (Patient not taking: Reported on 04/21/2019) 20 tablet 0 Completed Course at Unknown time  . polyethylene glycol (MIRALAX / GLYCOLAX) 17 g packet Take 17 g by mouth daily. 14 each 0 prn at prn    Assessment: Pharmacy consulted to dose heparin for AFib in this 79 year old male who is NPO.  Was on Eliquis PTA. Per RN, last dose was ~ 4 days ago.  CrCl = 42.4 ml/min  1/24 Heparin infusion started @ 1000 units/hr.   1/25 0004 HL 1.70, aPTT 170, SUPRAtherapeutic. Rate reduced to  750 units/hr.  1/25 @ 1032 HL: 1.32, aPTT: 79. Will dose by aPTT levels until HL and aPTT correlate (therapeutic x 1)   Goal of Therapy:  APTT: 66-102  Heparin level 0.3-0.7 units/ml Monitor platelets by anticoagulation protocol: Yes   Plan:  1/25 @ 1704 APTT 87 sec (therapeutic x 2)  APTT currently therapeutic.   Continue current infusion rate of heparin 750 units/hr.   Will recheck confirmatory aPTT, HL and CBC (H&H and platelets) with am labs.  Will d/c APTT checks and switch to heparin levels if am levels correlate.  Juanda Crumble  Rosita Fire, PharmD, BCPS Clinical Pharmacist 05/15/2019 6:16 PM

## 2019-05-15 NOTE — Progress Notes (Signed)
Received call from [facility] to stop heparin drip and restart in an hour.

## 2019-05-15 NOTE — Progress Notes (Signed)
ANTICOAGULATION CONSULT NOTE - Initial Consult  Pharmacy Consult for  Heparin  Indication: atrial fibrillation  Allergies  Allergen Reactions  . Bee Venom Anaphylaxis  . Feldene [Piroxicam] Hives    Patient Measurements: Height: 5\' 8"  (172.7 cm) Weight: 147 lb 12 oz (67 kg) IBW/kg (Calculated) : 68.4 Heparin Dosing Weight:  67 kg   Vital Signs: Temp: 97.7 F (36.5 C) (01/25 0801) Temp Source: Oral (01/25 0801) BP: 135/78 (01/25 1150) Pulse Rate: 69 (01/25 1150)  Labs: Recent Labs    05/07/2019 1120 05/06/2019 1304 04/21/2019 1515 05/15/19 0004 05/15/19 1032  HGB 13.4  --   --   --  11.5*  HCT 43.4  --   --   --  37.1*  PLT 286  --   --   --  244  APTT  --   --  28 141* 79*  LABPROT  --   --  15.6*  --   --   INR  --   --  1.3*  --   --   HEPARINUNFRC  --   --   --  1.70* 1.32*  CREATININE 1.36*  --   --   --   --   TROPONINIHS 240* 249*  --   --   --     Estimated Creatinine Clearance: 42.4 mL/min (A) (by C-G formula based on SCr of 1.36 mg/dL (H)).   Medical History: Past Medical History:  Diagnosis Date  . Agent orange exposure   . Asthma    exercise induced asthma   . COPD (chronic obstructive pulmonary disease) ([place])   . DJD (degenerative joint disease)    a. 02/2019 s/p L TKA.  Marland Kitchen GERD (gastroesophageal reflux disease)   . Hearing loss    wears hearing aids  . History of cardiac cath    a. History of 2 cardiac catheterizations, last ~ 10 yrs ago @ [facility]-->reportedly nl.  . History of stress test    a. 2020 - pharmacologic stress testing @ [facility]-->reportedly nl.  . Hyperlipidemia   . Hypertension   . Hypothyroidism   . Insomnia   . Lung cancer ([place])    a. Dx 03/2019.  Marland Kitchen Pericardial effusion    a. 03/2019 Echo: EF 60-65%, no rwma, nl RV fxn. Mildly dil LA. Mod pericardia eff w/o tamponade. Mild Ao sclerosis.  . Prostate cancer ([place])   . PTSD (post-traumatic stress disorder)   . Seasonal allergies   . Thyroid disease     Medications:   Facility-Administered Medications Prior to Admission  Medication Dose Route Frequency Provider Last Rate Last Admin  . Leuprolide Acetate (6 Month) (LUPRON) injection 45 mg  45 mg Intramuscular Once Stoioff, Scott C, MD       Medications Prior to Admission  Medication Sig Dispense Refill Last Dose  . amiodarone (PACERONE) 200 MG tablet Take 1 tablet (200 mg total) by mouth 2 (two) times daily. 60 tablet 0 Past Month at Unknown time  . apixaban (ELIQUIS) 5 MG TABS tablet Take 1 tablet (5 mg total) by mouth 2 (two) times daily. 60 tablet 0 Past Month at Unknown time  . cetirizine (ZYRTEC) 10 MG tablet Take 10 mg by mouth daily.    Past Month at Unknown time  . folic acid (FOLVITE) 1 MG tablet Take 1 mg by mouth daily.   Past Month at Unknown time  . folic acid (FOLVITE) 1 MG tablet Take 1 tablet (1 mg total) by mouth daily. Start 5-7 days before  Alimta chemotherapy. Continue until 21 days after Alimta completed. 100 tablet 3 Past Month at Unknown time  . levothyroxine (SYNTHROID) 88 MCG tablet Take 88 mcg by mouth daily before breakfast.   Past Month at Unknown time  . megestrol (MEGACE) 40 MG tablet Take 1 tablet (40 mg total) by mouth daily. 30 tablet 1 Past Month at Unknown time  . ondansetron (ZOFRAN) 8 MG tablet Take 1 tablet (8 mg total) by mouth 2 (two) times daily as needed for refractory nausea / vomiting. 60 tablet 2 Past Month at Unknown time  . pravastatin (PRAVACHOL) 20 MG tablet Take 20 mg by mouth at bedtime.   Past Month at Unknown time  . prazosin (MINIPRESS) 5 MG capsule Take 10 mg by mouth at bedtime.    Past Month at Unknown time  . prochlorperazine (COMPAZINE) 10 MG tablet Take 1 tablet (10 mg total) by mouth every 6 (six) hours as needed (Nausea or vomiting). 60 tablet 2 Past Month at Unknown time  . REFRESH TEARS 0.5 % SOLN Place 1 drop into both eyes 2 (two) times daily.    Past Month at prn  . sertraline (ZOLOFT) 50 MG tablet Take 50 mg by mouth at bedtime.    Past Month at  Unknown time  . tamsulosin (FLOMAX) 0.4 MG CAPS capsule Take 0.4 mg by mouth daily.    Past Month at Unknown time  . traZODone (DESYREL) 50 MG tablet Take 50 mg by mouth at bedtime.   Past Month at Unknown time  . alum & mag hydroxide-simeth (MAALOX/MYLANTA) 200-200-20 MG/5ML suspension Take 30 mLs by mouth every 6 (six) hours as needed for indigestion or heartburn. 355 mL 0 prn at prn  . lidocaine-prilocaine (EMLA) cream Apply to affected area once 30 g 3 prn at prn  . ondansetron (ZOFRAN ODT) 4 MG disintegrating tablet Take 1 tablet (4 mg total) by mouth every 8 (eight) hours as needed for nausea or vomiting. (Patient not taking: Reported on 04/26/2019) 20 tablet 0 Completed Course at Unknown time  . polyethylene glycol (MIRALAX / GLYCOLAX) 17 g packet Take 17 g by mouth daily. 14 each 0 prn at prn    Assessment: Pharmacy consulted to dose heparin for AFib in this 79 year old male who is NPO.  Was on Eliquis PTA. Per RN, last dose was ~ 4 days ago.  CrCl = 42.4 ml/min  1/24 Heparin infusion started @ 1000 units/hr.   1/25 0004 HL 1.70, aPTT 170, SUPRAtherapeutic. Rate reduced to  750 units/hr.   Goal of Therapy:  APTT: 66-102  Heparin level 0.3-0.7 units/ml Monitor platelets by anticoagulation protocol: Yes   Plan:  1/25 @ 1032 HL: 1.32, aPTT: 79. Will dose by aPTT levels until HL and aPTT correlate.  APTT currently therapeutic.  Continue current infusion rate of heparin 750 units/hr.  Will recheck confirmatory aPTT level in 6 hours.   Continue to monitor HL and CBC (H&H and platelets) daily per protocol.   Pernell Dupre, PharmD, BCPS Clinical Pharmacist 05/15/2019 12:49 PM

## 2019-05-15 NOTE — Progress Notes (Signed)
ANTICOAGULATION CONSULT NOTE - Initial Consult  Pharmacy Consult for  Heparin  Indication: atrial fibrillation  Allergies  Allergen Reactions  . Bee Venom Anaphylaxis  . Feldene [Piroxicam] Hives    Patient Measurements: Height: 5\' 8"  (172.7 cm) Weight: 147 lb 12 oz (67 kg) IBW/kg (Calculated) : 68.4 Heparin Dosing Weight:  67 kg   Vital Signs: Temp: 97.9 F (36.6 C) (01/25 0017) Temp Source: Oral (01/25 0017) BP: 118/59 (01/25 0017) Pulse Rate: 67 (01/25 0017)  Labs: Recent Labs    05/17/2019 1120 05/03/2019 1304 05/08/2019 1515 05/15/19 0004  HGB 13.4  --   --   --   HCT 43.4  --   --   --   PLT 286  --   --   --   APTT  --   --  28 141*  LABPROT  --   --  15.6*  --   INR  --   --  1.3*  --   HEPARINUNFRC  --   --   --  1.70*  CREATININE 1.36*  --   --   --   TROPONINIHS 240* 249*  --   --     Estimated Creatinine Clearance: 42.4 mL/min (A) (by C-G formula based on SCr of 1.36 mg/dL (H)).   Medical History: Past Medical History:  Diagnosis Date  . Agent orange exposure   . Asthma    exercise induced asthma   . COPD (chronic obstructive pulmonary disease) ([place])   . DJD (degenerative joint disease)    a. 02/2019 s/p L TKA.  Marland Kitchen GERD (gastroesophageal reflux disease)   . Hearing loss    wears hearing aids  . History of cardiac cath    a. History of 2 cardiac catheterizations, last ~ 10 yrs ago @ [facility]-->reportedly nl.  . History of stress test    a. 2020 - pharmacologic stress testing @ [facility]-->reportedly nl.  . Hyperlipidemia   . Hypertension   . Hypothyroidism   . Insomnia   . Lung cancer ([place])    a. Dx 03/2019.  Marland Kitchen Pericardial effusion    a. 03/2019 Echo: EF 60-65%, no rwma, nl RV fxn. Mildly dil LA. Mod pericardia eff w/o tamponade. Mild Ao sclerosis.  . Prostate cancer ([place])   . PTSD (post-traumatic stress disorder)   . Seasonal allergies   . Thyroid disease     Medications:  Facility-Administered Medications Prior to Admission  Medication Dose  Route Frequency Provider Last Rate Last Admin  . Leuprolide Acetate (6 Month) (LUPRON) injection 45 mg  45 mg Intramuscular Once Stoioff, Keelia Graybill C, MD       Medications Prior to Admission  Medication Sig Dispense Refill Last Dose  . amiodarone (PACERONE) 200 MG tablet Take 1 tablet (200 mg total) by mouth 2 (two) times daily. 60 tablet 0 Past Month at Unknown time  . apixaban (ELIQUIS) 5 MG TABS tablet Take 1 tablet (5 mg total) by mouth 2 (two) times daily. 60 tablet 0 Past Month at Unknown time  . cetirizine (ZYRTEC) 10 MG tablet Take 10 mg by mouth daily.    Past Month at Unknown time  . folic acid (FOLVITE) 1 MG tablet Take 1 mg by mouth daily.   Past Month at Unknown time  . folic acid (FOLVITE) 1 MG tablet Take 1 tablet (1 mg total) by mouth daily. Start 5-7 days before Alimta chemotherapy. Continue until 21 days after Alimta completed. 100 tablet 3 Past Month at Unknown time  .  levothyroxine (SYNTHROID) 88 MCG tablet Take 88 mcg by mouth daily before breakfast.   Past Month at Unknown time  . megestrol (MEGACE) 40 MG tablet Take 1 tablet (40 mg total) by mouth daily. 30 tablet 1 Past Month at Unknown time  . ondansetron (ZOFRAN) 8 MG tablet Take 1 tablet (8 mg total) by mouth 2 (two) times daily as needed for refractory nausea / vomiting. 60 tablet 2 Past Month at Unknown time  . pravastatin (PRAVACHOL) 20 MG tablet Take 20 mg by mouth at bedtime.   Past Month at Unknown time  . prazosin (MINIPRESS) 5 MG capsule Take 10 mg by mouth at bedtime.    Past Month at Unknown time  . prochlorperazine (COMPAZINE) 10 MG tablet Take 1 tablet (10 mg total) by mouth every 6 (six) hours as needed (Nausea or vomiting). 60 tablet 2 Past Month at Unknown time  . REFRESH TEARS 0.5 % SOLN Place 1 drop into both eyes 2 (two) times daily.    Past Month at prn  . sertraline (ZOLOFT) 50 MG tablet Take 50 mg by mouth at bedtime.    Past Month at Unknown time  . tamsulosin (FLOMAX) 0.4 MG CAPS capsule Take 0.4 mg by  mouth daily.    Past Month at Unknown time  . traZODone (DESYREL) 50 MG tablet Take 50 mg by mouth at bedtime.   Past Month at Unknown time  . alum & mag hydroxide-simeth (MAALOX/MYLANTA) 200-200-20 MG/5ML suspension Take 30 mLs by mouth every 6 (six) hours as needed for indigestion or heartburn. 355 mL 0 prn at prn  . lidocaine-prilocaine (EMLA) cream Apply to affected area once 30 g 3 prn at prn  . ondansetron (ZOFRAN ODT) 4 MG disintegrating tablet Take 1 tablet (4 mg total) by mouth every 8 (eight) hours as needed for nausea or vomiting. (Patient not taking: Reported on 05/21/2019) 20 tablet 0 Completed Course at Unknown time  . polyethylene glycol (MIRALAX / GLYCOLAX) 17 g packet Take 17 g by mouth daily. 14 each 0 prn at prn    Assessment: Pharmacy consulted to dose heparin for AFib in this 79 year old male who is NPO.  Was on Eliquis PTA. Per RN, last dose was ~ 4 days ago.  CrCl = 42.4 ml/min  1/25 0004 HL 1.70, aPTT 170, SUPRAtherapeutic, d/w RN.  Will hold heparin x 1 hour then resume at lower rate of 750 units/hr.  Recheck HL in 8 hours  Goal of Therapy:  Heparin level 0.3-0.7 units/ml Monitor platelets by anticoagulation protocol: Yes   Plan:  Will order baseline aPTT and INR.  Give 4000 units bolus x 1 Start heparin infusion at 1000 units/hr Check anti-Xa level in 8 hours and daily while on heparin Continue to monitor H&H and platelets   1/25: addendum:  Will hold heparin x 1 hour then resume at lower rate of 750 units/hr.  Recheck HL in 8 hours  [place] Crane, Hasina Kreager A 05/15/2019,1:32 AM

## 2019-05-15 NOTE — Progress Notes (Signed)
Initial Nutrition Assessment  DOCUMENTATION CODES:   Severe malnutrition in context of chronic illness  INTERVENTION:  Patient is currently NPO pending GI evaluation. Will monitor for plan of care and recommend appropriate nutrition intervention.   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   -Patient s/p esophogram/barium swallow study this morning that found initial swallows of barium grossly retained at the midesophagus with tight, strictured appearing caliber of the esophagus at the level of the left mainstream bronchus. -Pending GI consult.  Met with patient at bedside this morning. He reports he has had difficulty swallowing for the past month. He has trouble with liquids and solids. He has lately only been trying to take very small sips of liquids but he cannot keep anything down. His emesis is frothy with small amounts of blood. At home he has been unable to tolerate the "milky" oral nutrition supplements but was willing to try Boost Breeze and Pro-Stat. He has now been made NPO since RD assessment this morning and was ordered for IV fluids. Will monitor for plan of care.   Patient reports his UBW was 170-175 lbs. According to chart patient was 176.4 lbs on 01/18/2019. He is now 67 kg  (147.75 lbs). He has lost 28.65 lbs (16.2% body weight) over the past 4 months, which is significant for time frame.  Medications reviewed and include: D5-1/2NS at 75 mL/hr, heparin infusion.  Labs reviewed: Creatinine 1.36.  Discussed with RN.  NUTRITION - FOCUSED PHYSICAL EXAM:    Most Recent Value  Orbital Region  Severe depletion  Upper Arm Region  Severe depletion  Thoracic and Lumbar Region  Severe depletion  Buccal Region  Severe depletion  Temple Region  Severe depletion  Clavicle Bone Region  Severe depletion  Clavicle and Acromion Bone Region  Severe depletion  Scapular Bone Region  Unable to assess  Dorsal Hand  Severe depletion  Patellar Region  Severe depletion  Anterior Thigh Region  Severe depletion  Posterior Calf Region  Severe depletion  Edema (RD Assessment)  None  Hair  Reviewed  Eyes  Reviewed  Mouth  Reviewed  Skin  Reviewed  Nails  Reviewed     Diet Order:   Diet Order            Diet NPO time specified  Diet effective now             EDUCATION NEEDS:   No education needs have been identified at this time  Skin:  Skin Assessment: Reviewed RN Assessment  Last BM:  05/11/2019  Height:   Ht Readings from Last 1 Encounters:  05/12/2019 _0  (1.727 m)   Weight:   Wt Readings from Last 1 Encounters:  04/21/2019 67 kg   Ideal Body Weight:  70 kg  BMI:  Body mass  index is 22.47 kg/m.  Estimated Nutritional Needs:   Kcal:  1364-3837 (MSJ x 1.3-1.5)  Protein:  90-105 grams  Fluid:  1.7-2 L/day  Jacklynn Barnacle, MS, RD, LDN Office: [phone] Pager: [phone] After Hours/Weekend Pager: [phone]

## 2019-05-15 NOTE — Consult Note (Signed)
Carlos Lame, MD [facility]  [address], [place], [place] [address] Phone: [phone] Fax : [phone]  Consultation  Referring Provider:     Dr. Kurtis Bushman Primary Care Physician:  Clinic, [facility] Primary Gastroenterologist: At [facility]         Reason for Consultation:     Dysphagia  Date of Admission:  05/10/2019 Date of Consultation:  05/15/2019         HPI:   Carlos Barrera is a 79 y.o. male who was admitted with intractable nausea vomiting dysphagia.  The patient has a history of prostate cancer and lung cancer.  The patient was admitted with dysphagia and he reports that he has had increasing respiratory problems and is presently reporting that he is coughing up food particles.  The patient had a barium swallow that showed:  IMPRESSION: 1. Examination limited as detailed above. Initial swallows of barium are seen to be grossly retained at the midesophagus with tight, strictured appearing caliber of the esophagus at the level of the left mainstem bronchus. 2. No evidence of penetration or aspiration.  The patient reports that he quit drinking and smoking 40 years ago.  He also reports that he has had a 20 pound weight loss with 5 of those pounds being this week.  The patient had a PET scan done back on 19 January that showed widespread metastatic disease from lung primary involving lymph nodes right adrenal and bones.  The patient has had readmissions for chest pain and nausea with admission on 2 January and then admitted again on 7 January with A. fib.  The patient has been started on heparin.  In the beginning of January the patient was found to have a pericardial effusion with an unsuccessful cardiocentesis.  Past Medical History:  Diagnosis Date  . Agent orange exposure   . Asthma    exercise induced asthma   . COPD (chronic obstructive pulmonary disease) ([place])   . DJD (degenerative joint disease)    a. 02/2019 s/p L TKA.  Marland Kitchen GERD (gastroesophageal reflux disease)     . Hearing loss    wears hearing aids  . History of cardiac cath    a. History of 2 cardiac catheterizations, last ~ 10 yrs ago @ [facility]-->reportedly nl.  . History of stress test    a. 2020 - pharmacologic stress testing @ [facility]-->reportedly nl.  . Hyperlipidemia   . Hypertension   . Hypothyroidism   . Insomnia   . Lung cancer ([place])    a. Dx 03/2019.  Marland Kitchen Pericardial effusion    a. 03/2019 Echo: EF 60-65%, no rwma, nl RV fxn. Mildly dil LA. Mod pericardia eff w/o tamponade. Mild Ao sclerosis.  . Prostate cancer ([place])   . PTSD (post-traumatic stress disorder)   . Seasonal allergies   . Thyroid disease     Past Surgical History:  Procedure Laterality Date  . BACK SURGERY     x 2  . COLONOSCOPY     hx polyps  . HERNIA REPAIR    . INCISE AND DRAIN ABCESS     patient denies this procedure 03/03/19  . NECK SURGERY     x 1  . PERICARDIOCENTESIS N/A 04/28/2019   Procedure: PERICARDIOCENTESIS;  Surgeon: Wellington Hampshire, MD;  Location: [facility];  Service: Cardiovascular;  Laterality: N/A;  . TONSILLECTOMY    . 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 TOTAL KNEE ARTHROPLASTY Left 03/09/2019   Procedure: LEFT TOTAL KNEE ARTHROPLASTY;  Surgeon: Meredith Pel, MD;  Location: [facility];  Service: Orthopedics;  Laterality: Left;  . WISDOM TOOTH EXTRACTION      Prior to Admission medications   Medication Sig Start Date End Date Taking? Authorizing Provider  amiodarone (PACERONE) 200 MG tablet Take 1 tablet (200 mg total) by mouth 2 (two) times daily. 04/29/19  Yes Nolberto Hanlon, MD  apixaban (ELIQUIS) 5 MG TABS tablet Take 1 tablet (5 mg total) by mouth 2 (two) times daily. 04/21/19  Yes Pokhrel, Laxman, MD  cetirizine (ZYRTEC) 10 MG tablet Take 10 mg by mouth daily.  02/13/18  Yes [provider]  folic acid (FOLVITE) 1 MG tablet Take 1 mg by mouth daily.   Yes [provider]  folic acid (FOLVITE) 1 MG tablet Take 1 tablet (1 mg total) by mouth daily. Start  5-7 days before Alimta chemotherapy. Continue until 21 days after Alimta completed. 05/12/19  Yes Lloyd Huger, MD  levothyroxine (SYNTHROID) 88 MCG tablet Take 88 mcg by mouth daily before breakfast.   Yes [provider]  megestrol (MEGACE) 40 MG tablet Take 1 tablet (40 mg total) by mouth daily. 05/11/19  Yes Lloyd Huger, MD  ondansetron (ZOFRAN) 8 MG tablet Take 1 tablet (8 mg total) by mouth 2 (two) times daily as needed for refractory nausea / vomiting. 05/12/19  Yes Lloyd Huger, MD  pravastatin (PRAVACHOL) 20 MG tablet Take 20 mg by mouth at bedtime.   Yes [provider]  prazosin (MINIPRESS) 5 MG capsule Take 10 mg by mouth at bedtime.  02/21/18  Yes [provider]  prochlorperazine (COMPAZINE) 10 MG tablet Take 1 tablet (10 mg total) by mouth every 6 (six) hours as needed (Nausea or vomiting). 05/12/19  Yes Lloyd Huger, MD  REFRESH TEARS 0.5 % SOLN Place 1 drop into both eyes 2 (two) times daily.  02/13/18  Yes [provider]  sertraline (ZOLOFT) 50 MG tablet Take 50 mg by mouth at bedtime.  12/14/18  Yes [provider]  tamsulosin (FLOMAX) 0.4 MG CAPS capsule Take 0.4 mg by mouth daily.  01/06/19  Yes [provider]  traZODone (DESYREL) 50 MG tablet Take 50 mg by mouth at bedtime.   Yes [provider]  alum & mag hydroxide-simeth (MAALOX/MYLANTA) 200-200-20 MG/5ML suspension Take 30 mLs by mouth every 6 (six) hours as needed for indigestion or heartburn. 04/23/19   Thornell Mule, MD  lidocaine-prilocaine (EMLA) cream Apply to affected area once 05/12/19   Lloyd Huger, MD  ondansetron (ZOFRAN ODT) 4 MG disintegrating tablet Take 1 tablet (4 mg total) by mouth every 8 (eight) hours as needed for nausea or vomiting. Patient not taking: Reported on 05/13/2019 04/23/19   Thornell Mule, MD  polyethylene glycol (MIRALAX / GLYCOLAX) 17 g packet Take 17 g by mouth daily. 04/21/19   Pokhrel, Corrie Mckusick, MD     Family History  Problem Relation Age of Onset  . Breast cancer Mother        w/ brain mets -> died in her 18's.  Marland Kitchen Heart attack Father        died @ 39  . Other Brother        multiple medical issues, pt unaware of specifics.     Social History   Tobacco Use  . Smoking status: Former Smoker    Packs/day: 2.00    Years: 20.00    Pack years: 40.00    Types: Cigarettes    Quit  date: 04/20/1980    Years since quitting: 39.0  . Smokeless tobacco: Never Used  Substance Use Topics  . Alcohol use: No  . Drug use: No    Allergies as of 05/19/2019 - Review Complete 05/19/2019  Allergen Reaction Noted  . Bee venom Anaphylaxis 12/06/2018  . Feldene [piroxicam] Hives 05/07/2014    Review of Systems:    All systems reviewed and negative except where noted in HPI.   Physical Exam:  Vital signs in last 24 hours: Temp:  [97.7 F (36.5 C)-97.9 F (36.6 C)] 97.7 F (36.5 C) (01/25 0801) Pulse Rate:  [65-75] 69 (01/25 1150) Resp:  [16-22] 17 (01/25 0017) BP: (118-139)/(59-89) 135/78 (01/25 1150) SpO2:  [93 %-100 %] 98 % (01/25 1150) Last BM Date: 05/11/19 General:   Pleasant, cooperative in NAD Head:  Normocephalic and atraumatic. Eyes:   No icterus.   Conjunctiva pink. PERRLA. Ears:  Normal auditory acuity. Neck:  Supple; no masses or thyroidomegaly Lungs: Respirations even and unlabored. Lungs clear to auscultation bilaterally.   No wheezes, crackles, or rhonchi.  Heart:  Regular rate and rhythm;  Without murmur, clicks, rubs or gallops Abdomen:  Soft, nondistended, nontender. Normal bowel sounds. No appreciable masses or hepatomegaly.  No rebound or guarding.  Rectal:  Not performed. Msk:  Symmetrical without gross deformities.    Extremities:  Without edema, cyanosis or clubbing. Neurologic:  Alert and oriented x3;  grossly normal neurologically. Skin:  Intact without significant lesions or rashes. Cervical Nodes:  No significant cervical adenopathy. Psych:  Alert and  cooperative. Normal affect.  LAB RESULTS: Recent Labs    05/15/2019 1120 05/15/19 1032  WBC 5.2 4.8  HGB 13.4 11.5*  HCT 43.4 37.1*  PLT 286 244   BMET Recent Labs    05/19/2019 1120  NA 143  K 4.0  CL 106  CO2 27  GLUCOSE 97  BUN 21  CREATININE 1.36*  CALCIUM 10.8*   LFT Recent Labs    04/29/2019 1120  PROT 7.3  ALBUMIN 3.6  AST 18  ALT 10  ALKPHOS 86  BILITOT 0.7   PT/INR Recent Labs    05/07/2019 1515  LABPROT 15.6*  INR 1.3*    STUDIES: DG Chest Portable 1 View  Result Date: 05/11/2019 CLINICAL DATA:  Nausea, vomiting and weakness for 2 weeks. History of lung carcinoma. EXAM: PORTABLE CHEST 1 VIEW COMPARISON:  PA and lateral chest 04/27/2019.  CT chest 04/15/2019. FINDINGS: The lungs are emphysematous but clear. There is some volume loss in the right chest. No pneumothorax or pleural effusion. Heart size is normal. Fullness of the right hilum is consistent with lymphadenopathy seen on prior CT. IMPRESSION: No acute disease. Emphysema. Right hilar fullness consistent with lymphadenopathy seen on prior CT. Electronically Signed   By: Inge Rise M.D.   On: 04/26/2019 10:56   DG ESOPHAGUS W SINGLE CM (SOL OR THIN BA)  Result Date: 05/15/2019 CLINICAL DATA:  Dysphagia EXAM: ESOPHOGRAM/BARIUM SWALLOW TECHNIQUE: Single contrast examination was performed using  thick barium. FLUOROSCOPY TIME:  Fluoroscopy Time:  00:30 Number of Acquired Spot Images: 25 COMPARISON:  PET-CT, 05/09/2019 FINDINGS: Single contrast barium swallow examination was performed under fluoroscopy. Examination is generally limited by patient nausea and intolerance of barium. There is no evidence of penetration or aspiration on initial oropharyngeal phase of swallow. Initial swallows of barium are seen to be grossly retained at the midesophagus with tight, strictured appearing caliber of the esophagus at the level of the left mainstem bronchus. Examination was  ended at this point due to identification  of obstruction and patient inability to tolerate further oral ingestion. The stomach and proximal small bowel are not assessed. IMPRESSION: 1. Examination limited as detailed above. Initial swallows of barium are seen to be grossly retained at the midesophagus with tight, strictured appearing caliber of the esophagus at the level of the left mainstem bronchus. 2. No evidence of penetration or aspiration. Electronically Signed   By: Eddie Candle M.D.   On: 05/15/2019 09:06      Impression / Plan:   Assessment: Active Problems:   Adenocarcinoma of right lung (HCC)   Paroxysmal atrial fibrillation (HCC)   Intractable vomiting   Pericardial effusion   Hypertension   Dysphagia   Carlos Barrera is a 79 y.o. 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  The patient is presently in sinus rhythm but is on heparin because of his history of A. fib.  I am now being consulted for this patient's dysphagia with metastatic lung cancer and inability to keep any of his food down.  Plan:  The patient will be set up for a upper endoscopy with possible dilation.  The patient will have his heparin stopped for 6 hours and due to the likelihood that he will get esophageal dilation it was recommended by cardiology that the patient stay off the heparin for 24 hours.  The patient has been explained the risks of the procedure including perforation need for surgery and death.  Hopefully the stricture is a benign stricture and not metastatic disease due to cancer and not being amenable to dilation if that turns out to be the case.  The patient has been explained the plan and agrees with it.  Thank you for involving me in the care of this patient.      LOS: 1 day   Carlos Lame, MD  05/15/2019, 2:20 PM Pager [phone] 7am-5pm  Check AMION for 5pm -7am coverage and on weekends   Note: This dictation was prepared with Dragon  dictation along with smaller phrase technology. Any transcriptional errors that result from this process are unintentional.

## 2019-05-15 NOTE — Plan of Care (Signed)

## 2019-05-16 ENCOUNTER — Encounter: Payer: Self-pay | Admitting: Internal Medicine

## 2019-05-16 ENCOUNTER — Inpatient Hospital Stay: Payer: Medicare Other | Admitting: Anesthesiology

## 2019-05-16 ENCOUNTER — Encounter: Admission: EM | Disposition: E | Payer: Self-pay | Source: Home / Self Care | Attending: Internal Medicine

## 2019-05-16 DIAGNOSIS — R1310 Dysphagia, unspecified: Secondary | ICD-10-CM

## 2019-05-16 DIAGNOSIS — K222 Esophageal obstruction: Secondary | ICD-10-CM

## 2019-05-16 DIAGNOSIS — N179 Acute kidney failure, unspecified: Secondary | ICD-10-CM

## 2019-05-16 HISTORY — PX: ESOPHAGOGASTRODUODENOSCOPY (EGD) WITH PROPOFOL: SHX5813

## 2019-05-16 LAB — CBC
HCT: 36.1 % — ABNORMAL LOW (ref 39.0–52.0)
Hemoglobin: 11.1 g/dL — ABNORMAL LOW (ref 13.0–17.0)
MCH: 29.5 pg (ref 26.0–34.0)
MCHC: 30.7 g/dL (ref 30.0–36.0)
MCV: 96 fL (ref 80.0–100.0)
Platelets: 249 10*3/uL (ref 150–400)
RBC: 3.76 MIL/uL — ABNORMAL LOW (ref 4.22–5.81)
RDW: 16.4 % — ABNORMAL HIGH (ref 11.5–15.5)
WBC: 4.7 10*3/uL (ref 4.0–10.5)
nRBC: 0 % (ref 0.0–0.2)

## 2019-05-16 LAB — BASIC METABOLIC PANEL
Anion gap: 10 (ref 5–15)
BUN: 18 mg/dL (ref 8–23)
CO2: 26 mmol/L (ref 22–32)
Calcium: 10.1 mg/dL (ref 8.9–10.3)
Chloride: 107 mmol/L (ref 98–111)
Creatinine, Ser: 1.17 mg/dL (ref 0.61–1.24)
GFR calc Af Amer: >60 mL/min (ref >60)
GFR calc non Af Amer: 59 mL/min — ABNORMAL LOW (ref >60)
Glucose, Bld: 91 mg/dL (ref 70–99)
Potassium: 3.5 mmol/L (ref 3.5–5.1)
Sodium: 143 mmol/L (ref 135–145)

## 2019-05-16 LAB — APTT
aPTT: 38 seconds — ABNORMAL HIGH (ref 24–36)
aPTT: 55 seconds — ABNORMAL HIGH (ref 24–36)

## 2019-05-16 LAB — HEPARIN LEVEL (UNFRACTIONATED): Heparin Unfractionated: 0.63 IU/mL (ref 0.30–0.70)

## 2019-05-16 SURGERY — ESOPHAGOGASTRODUODENOSCOPY (EGD) WITH PROPOFOL
Anesthesia: General

## 2019-05-16 MED ORDER — PHENYLEPHRINE HCL (PRESSORS) 10 MG/ML IV SOLN
INTRAVENOUS | Status: DC | PRN
  Administered 2019-05-16: 200 ug via INTRAVENOUS

## 2019-05-16 MED ORDER — LIDOCAINE HCL (CARDIAC) PF 100 MG/5ML IV SOSY
PREFILLED_SYRINGE | INTRAVENOUS | Status: DC | PRN
  Administered 2019-05-16: 100 mg via INTRAVENOUS

## 2019-05-16 MED ORDER — FENTANYL CITRATE (PF) 100 MCG/2ML IJ SOLN
INTRAMUSCULAR | Status: AC
  Filled 2019-05-16: qty 2

## 2019-05-16 MED ORDER — SUCCINYLCHOLINE CHLORIDE 20 MG/ML IJ SOLN
INTRAMUSCULAR | Status: DC | PRN
  Administered 2019-05-16: 80 mg via INTRAVENOUS

## 2019-05-16 MED ORDER — TRAZODONE HCL 50 MG PO TABS: 50.0000 mg | ORAL_TABLET | Freq: Every evening | ORAL | Status: DC | PRN

## 2019-05-16 MED ORDER — SODIUM CHLORIDE 0.9 % IV SOLN
INTRAVENOUS | Status: DC
  Administered 2019-05-16: 1000 mL via INTRAVENOUS

## 2019-05-16 MED ORDER — PROPOFOL 10 MG/ML IV BOLUS
INTRAVENOUS | Status: DC | PRN
  Administered 2019-05-16: 100 mg via INTRAVENOUS

## 2019-05-16 MED ORDER — HEPARIN BOLUS VIA INFUSION
1000.0000 [IU] | Freq: Once | INTRAVENOUS | Status: AC
  Administered 2019-05-16: 1000 [IU] via INTRAVENOUS
  Filled 2019-05-16: qty 1000

## 2019-05-16 MED ORDER — FENTANYL CITRATE (PF) 100 MCG/2ML IJ SOLN
INTRAMUSCULAR | Status: DC | PRN
  Administered 2019-05-16 (×3): 25 ug via INTRAVENOUS

## 2019-05-16 MED ORDER — EPHEDRINE SULFATE 50 MG/ML IJ SOLN
INTRAMUSCULAR | Status: AC
  Filled 2019-05-16: qty 1

## 2019-05-16 MED ORDER — LORAZEPAM 2 MG/ML IJ SOLN
1.0000 mg | Freq: Once | INTRAMUSCULAR | Status: AC
  Administered 2019-05-17: 1 mg via INTRAVENOUS
  Filled 2019-05-16: qty 1

## 2019-05-16 MED ORDER — EPHEDRINE SULFATE 50 MG/ML IJ SOLN
INTRAMUSCULAR | Status: DC | PRN
  Administered 2019-05-16: 10 mg via INTRAVENOUS

## 2019-05-16 NOTE — Anesthesia Postprocedure Evaluation (Signed)
Anesthesia Post Note  Patient: Carlos Barrera  Procedure(s) Performed: ESOPHAGOGASTRODUODENOSCOPY (EGD) WITH PROPOFOL (N/A )  Patient location during evaluation: PACU Anesthesia Type: General Level of consciousness: awake and alert Pain management: pain level controlled Vital Signs Assessment: post-procedure vital signs reviewed and stable Respiratory status: spontaneous breathing, nonlabored ventilation and respiratory function stable Cardiovascular status: blood pressure returned to baseline and stable Postop Assessment: no apparent nausea or vomiting Anesthetic complications: no     Last Vitals:  Vitals:   04/28/2019 1117 05/08/2019 1132  BP: 134/80 132/87  Pulse: 80 75  Resp: 13 16  Temp:  (!) 36.3 C  SpO2: 97% 95%    Last Pain:  Vitals:   05/04/2019 1132  TempSrc:   PainSc: 0-No pain                 Tera Mater

## 2019-05-16 NOTE — Anesthesia Preprocedure Evaluation (Addendum)
Anesthesia Evaluation  Patient identified by MRN, date of birth, ID band Patient awake    Reviewed: Allergy & Precautions, H&P , NPO status , Patient's Chart, lab work & pertinent test results  Airway Mallampati: II  TM Distance: >3 FB     Dental  (+) Chipped   Pulmonary asthma , COPD, former smoker,  Lung cancer          Cardiovascular hypertension, +CHF (grade II diastolic dysfunction)    Moderate pericardial effusion, not impairing heart function, conservative treatment recommended   Neuro/Psych PSYCHIATRIC DISORDERS Anxiety negative neurological ROS     GI/Hepatic Neg liver ROS, GERD  ,Admitted with projectile vomiting   Endo/Other  Hypothyroidism   Renal/GU negative Renal ROS  negative genitourinary   Musculoskeletal  (+) Arthritis ,   Abdominal   Peds  Hematology negative hematology ROS (+)   Anesthesia Other Findings Past Medical History: No date: Agent orange exposure No date: Asthma     Comment:  exercise induced asthma  No date: COPD (chronic obstructive pulmonary disease) (HCC) No date: DJD (degenerative joint disease)     Comment:  a. 02/2019 s/p L TKA. No date: GERD (gastroesophageal reflux disease) No date: Hearing loss     Comment:  wears hearing aids No date: History of cardiac cath     Comment:  a. History of 2 cardiac catheterizations, last ~ 10 yrs               ago @ [facility]-->reportedly nl. No date: History of stress test     Comment:  a. 2020 - pharmacologic stress testing @               [facility]-->reportedly nl. No date: Hyperlipidemia No date: Hypertension No date: Hypothyroidism No date: Insomnia No date: Lung cancer ([place])     Comment:  a. Dx 03/2019. No date: Pericardial effusion     Comment:  a. 03/2019 Echo: EF 60-65%, no rwma, nl RV fxn. Mildly               dil LA. Mod pericardia eff w/o tamponade. Mild Ao               sclerosis. No date: Prostate cancer ([place]) No date: PTSD  (post-traumatic stress disorder) No date: Seasonal allergies No date: Thyroid disease  Past Surgical History: No date: BACK SURGERY     Comment:  x 2 No date: COLONOSCOPY     Comment:  hx polyps No date: HERNIA REPAIR No date: INCISE AND DRAIN ABCESS     Comment:  patient denies this procedure 03/03/19 No date: NECK SURGERY     Comment:  x 1 04/28/2019: PERICARDIOCENTESIS; N/A     Comment:  Procedure: PERICARDIOCENTESIS;  Surgeon: Wellington Hampshire, MD;  Location: [facility];  Service:               Cardiovascular;  Laterality: N/A; No date: TONSILLECTOMY 03/09/2019: TOTAL KNEE ARTHROPLASTY; Left 03/09/2019: TOTAL KNEE ARTHROPLASTY; Left     Comment:  Procedure: LEFT TOTAL KNEE ARTHROPLASTY;  Surgeon: Meredith Pel, MD;  Location: [facility];  Service:               Orthopedics;  Laterality: Left; No date: WISDOM TOOTH EXTRACTION  BMI    Body Mass Index: 20.38 kg/m  Reproductive/Obstetrics negative OB ROS                            Anesthesia Physical Anesthesia Plan  ASA: III  Anesthesia Plan: General ETT, Rapid Sequence and Cricoid Pressure   Post-op Pain Management:    Induction:   PONV Risk Score and Plan:   Airway Management Planned:   Additional Equipment:   Intra-op Plan:   Post-operative Plan:   Informed Consent: I have reviewed the patients History and Physical, chart, labs and discussed the procedure including the risks, benefits and alternatives for the proposed anesthesia with the patient or authorized representative who has indicated his/her understanding and acceptance.     Dental Advisory Given  Plan Discussed with: Anesthesiologist and CRNA  Anesthesia Plan Comments:        Anesthesia Quick Evaluation

## 2019-05-16 NOTE — Anesthesia Procedure Notes (Signed)
Procedure Name: Intubation Date/Time: 05/07/2019 10:12 AM Performed by: Hedda Slade, CRNA Pre-anesthesia Checklist: Patient identified, Patient being monitored, Timeout performed, Emergency Drugs available and Suction available Patient Re-evaluated:Patient Re-evaluated prior to induction Oxygen Delivery Method: Circle system utilized Preoxygenation: Pre-oxygenation with 100% oxygen Induction Type: IV induction, Rapid sequence and Cricoid Pressure applied Laryngoscope Size: McGraph and 4 Grade View: Grade I Tube type: Oral Tube size: 7.0 mm Number of attempts: 1 Airway Equipment and Method: Stylet and Video-laryngoscopy Placement Confirmation: ETT inserted through vocal cords under direct vision,  positive ETCO2 and breath sounds checked- equal and bilateral Secured at: 22 cm Tube secured with: Tape Dental Injury: Teeth and Oropharynx as per pre-operative assessment

## 2019-05-16 NOTE — Op Note (Signed)
[facility] Gastroenterology Patient Name: Carlos Barrera Procedure Date: 04/27/2019 9:52 AM MRN: [id number] Account #: [id number] Date of Birth: [birth year] Admit Type: Inpatient Age: 79 Room: [facility] ENDO ROOM 4 Gender: Male Note Status: Finalized Procedure:             Upper GI endoscopy Indications:           Dysphagia Providers:             [name] MD, MD Medicines:             General Anesthesia Complications:         No immediate complications. Procedure:             Pre-Anesthesia Assessment:                        - Prior to the procedure, a History and Physical was                         performed, and patient medications and allergies were                         reviewed. The patient's tolerance of previous                         anesthesia was also reviewed. The risks and benefits                         of the procedure and the sedation options and risks                         were discussed with the patient. All questions were                         answered, and informed consent was obtained. Prior                         Anticoagulants: The patient last took heparin on the                         day of the procedure. ASA Grade Assessment: III - A                         patient with severe systemic disease. After reviewing                         the risks and benefits, the patient was deemed in                         satisfactory condition to undergo the procedure.                        After obtaining informed consent, the endoscope was                         passed under direct vision. Throughout the procedure,                         the patient's blood pressure, pulse, and oxygen  saturations were monitored continuously. The Endoscope                         was introduced through the mouth, and advanced to the                         second part of duodenum. The upper GI endoscopy was   accomplished without difficulty. The patient tolerated                         the procedure well. 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 This stenosis measured 5 cm (in length). The stenosis was       traversed after downsizing scope. A TTS dilator was passed through the       scope. Dilation with a 12-13.5-15 mm balloon dilator was performed to       13.5 mm. The dilation site was examined following endoscope reinsertion       and showed no change.      Food was found in the upper third of the esophagus.      The stomach was normal.      The examined duodenum was normal. Impression:            - Extrinsic narrowing of the esophagus. Dilated.                        - Food in the upper third of the esophagus.                        - Normal stomach.                        - Normal examined duodenum.                        - No specimens collected. Recommendation:        - Return patient to hospital ward for ongoing care.                        - Clear liquid diet.                        - Continue present medications. Procedure Code(s):     --- Professional ---                        [phone], Esophagogastroduodenoscopy, flexible,                         transoral; with transendoscopic balloon dilation of                         esophagus (less than 30 mm diameter) Diagnosis Code(s):     --- Professional ---                        R13.10, Dysphagia, unspecified                        K22.2, Esophageal obstruction CPT copyright 2019 American Medical Association.  All rights reserved. The codes documented in this report are preliminary and upon coder review may  be revised to meet current compliance requirements. [name] MD, MD 05/13/2019 10:52:35 AM This report has been signed electronically. Number of Addenda: 0 Note Initiated On: 05/19/2019 9:52 AM Estimated Blood Loss:  Estimated blood loss: none.      [facility]

## 2019-05-16 NOTE — Transfer of Care (Signed)
Immediate Anesthesia Transfer of Care Note  Patient: Carlos Barrera  Procedure(s) Performed: ESOPHAGOGASTRODUODENOSCOPY (EGD) WITH PROPOFOL (N/A )  Patient Location: PACU  Anesthesia Type:General  Level of Consciousness: sedated  Airway & Oxygen Therapy: Patient Spontanous Breathing  Post-op Assessment: Report given to RN and Post -op Vital signs reviewed and stable  Post vital signs: Reviewed and stable  Last Vitals:  Vitals Value Taken Time  BP 132/83 05/05/2019 1106  Temp 36.3 C 05/15/2019 1102  Pulse 80 05/10/2019 1106  Resp 12 04/23/2019 1106  SpO2 94 % 04/29/2019 1106  Vitals shown include unvalidated device data.  Last Pain:  Vitals:   05/11/2019 0859  TempSrc: Oral  PainSc:          Complications: No apparent anesthesia complications

## 2019-05-16 NOTE — Progress Notes (Signed)
[place] for  Heparin  Indication: atrial fibrillation  Allergies  Allergen Reactions  . Bee Venom Anaphylaxis  . Feldene [Piroxicam] Hives    Patient Measurements: Height: 5\' 8"  (172.7 cm) Weight: 134 lb 0.6 oz (60.8 kg) IBW/kg (Calculated) : 68.4 Heparin Dosing Weight:  67 kg   Vital Signs: Temp: 97.8 F (36.6 C) (01/26 1557) Temp Source: Oral (01/26 1557) BP: 142/85 (01/26 1557) Pulse Rate: 66 (01/26 1557)  Labs: Recent Labs    05/08/2019 1120 04/27/2019 1120 05/02/2019 1304 05/19/2019 1515 05/03/2019 1515 05/15/19 0004 05/15/19 1032 05/15/19 1032 05/15/19 1704 05/13/2019 0425 05/15/2019 1841  HGB 13.4   < >  --   --   --   --  11.5*  --   --  11.1*  --   HCT 43.4  --   --   --   --   --  37.1*  --   --  36.1*  --   PLT 286  --   --   --   --   --  244  --   --  249  --   APTT  --    < >  --  28   < > 141* 79*   < > 87* 38* 55*  LABPROT  --   --   --  15.6*  --   --   --   --   --   --   --   INR  --   --   --  1.3*  --   --   --   --   --   --   --   HEPARINUNFRC  --   --   --   --   --  1.70* 1.32*  --   --  0.63  --   CREATININE 1.36*  --   --   --   --   --   --   --   --  1.17  --   TROPONINIHS 240*  --  249*  --   --   --   --   --   --   --   --    < > = values in this interval not displayed.    Estimated Creatinine Clearance: 44.7 mL/min (by C-G formula based on SCr of 1.17 mg/dL).   Medical History: Past Medical History:  Diagnosis Date  . Agent orange exposure   . Asthma    exercise induced asthma   . COPD (chronic obstructive pulmonary disease) ([place])   . DJD (degenerative joint disease)    a. 02/2019 s/p L TKA.  Marland Kitchen GERD (gastroesophageal reflux disease)   . Hearing loss    wears hearing aids  . History of cardiac cath    a. History of 2 cardiac catheterizations, last ~ 10 yrs ago @ [facility]-->reportedly nl.  . History of stress test    a. 2020 - pharmacologic stress testing @ [facility]-->reportedly nl.  . Hyperlipidemia    . Hypertension   . Hypothyroidism   . Insomnia   . Lung cancer ([place])    a. Dx 03/2019.  Marland Kitchen Pericardial effusion    a. 03/2019 Echo: EF 60-65%, no rwma, nl RV fxn. Mildly dil LA. Mod pericardia eff w/o tamponade. Mild Ao sclerosis.  . Prostate cancer ([place])   . PTSD (post-traumatic stress disorder)   . Seasonal allergies   . Thyroid disease     Medications:  Facility-Administered  Medications Prior to Admission  Medication Dose Route Frequency Provider Last Rate Last Admin  . Leuprolide Acetate (6 Month) (LUPRON) injection 45 mg  45 mg Intramuscular Once Stoioff, Scott C, MD       Medications Prior to Admission  Medication Sig Dispense Refill Last Dose  . amiodarone (PACERONE) 200 MG tablet Take 1 tablet (200 mg total) by mouth 2 (two) times daily. 60 tablet 0 Past Month at Unknown time  . apixaban (ELIQUIS) 5 MG TABS tablet Take 1 tablet (5 mg total) by mouth 2 (two) times daily. 60 tablet 0 Past Month at Unknown time  . cetirizine (ZYRTEC) 10 MG tablet Take 10 mg by mouth daily.    Past Month at Unknown time  . folic acid (FOLVITE) 1 MG tablet Take 1 mg by mouth daily.   Past Month at Unknown time  . folic acid (FOLVITE) 1 MG tablet Take 1 tablet (1 mg total) by mouth daily. Start 5-7 days before Alimta chemotherapy. Continue until 21 days after Alimta completed. 100 tablet 3 Past Month at Unknown time  . levothyroxine (SYNTHROID) 88 MCG tablet Take 88 mcg by mouth daily before breakfast.   Past Month at Unknown time  . megestrol (MEGACE) 40 MG tablet Take 1 tablet (40 mg total) by mouth daily. 30 tablet 1 Past Month at Unknown time  . ondansetron (ZOFRAN) 8 MG tablet Take 1 tablet (8 mg total) by mouth 2 (two) times daily as needed for refractory nausea / vomiting. 60 tablet 2 Past Month at Unknown time  . pravastatin (PRAVACHOL) 20 MG tablet Take 20 mg by mouth at bedtime.   Past Month at Unknown time  . prazosin (MINIPRESS) 5 MG capsule Take 10 mg by mouth at bedtime.    Past Month at  Unknown time  . prochlorperazine (COMPAZINE) 10 MG tablet Take 1 tablet (10 mg total) by mouth every 6 (six) hours as needed (Nausea or vomiting). 60 tablet 2 Past Month at Unknown time  . REFRESH TEARS 0.5 % SOLN Place 1 drop into both eyes 2 (two) times daily.    Past Month at prn  . sertraline (ZOLOFT) 50 MG tablet Take 50 mg by mouth at bedtime.    Past Month at Unknown time  . tamsulosin (FLOMAX) 0.4 MG CAPS capsule Take 0.4 mg by mouth daily.    Past Month at Unknown time  . traZODone (DESYREL) 50 MG tablet Take 50 mg by mouth at bedtime.   Past Month at Unknown time  . alum & mag hydroxide-simeth (MAALOX/MYLANTA) 200-200-20 MG/5ML suspension Take 30 mLs by mouth every 6 (six) hours as needed for indigestion or heartburn. 355 mL 0 prn at prn  . lidocaine-prilocaine (EMLA) cream Apply to affected area once 30 g 3 prn at prn  . ondansetron (ZOFRAN ODT) 4 MG disintegrating tablet Take 1 tablet (4 mg total) by mouth every 8 (eight) hours as needed for nausea or vomiting. (Patient not taking: Reported on 04/26/2019) 20 tablet 0 Completed Course at Unknown time  . polyethylene glycol (MIRALAX / GLYCOLAX) 17 g packet Take 17 g by mouth daily. 14 each 0 prn at prn    Assessment: Pharmacy consulted to dose heparin for AFib in this 79 year old male who is NPO.  Was on Eliquis PTA. Per RN, last dose was ~ 4 days ago.  CrCl = 42.4 ml/min  1/26 0425 aPTT 38 seconds, subtherapeutic; was not correlating w/ HL; however, heparin drip was stopped for planned EGD today.  Will need to f/u on further plans for anti-coagulation post-EGD.  1/26 1841 apTT 55. Heparin restarted at 750 at 1231. Will bolus 1000 units and increase rate to 850 units/hr.   Goal of Therapy:  APTT: 66-102  Heparin level 0.3-0.7 units/ml Monitor platelets by anticoagulation protocol: Yes   Plan:  APTT subtherapeutic. Will bolus 1000 units and increase drip to 850 units/hr and check aPTT in 6 hours. F/u HL with labs tomorrow to see if  correlate. Daily CBC.   Oswald Hillock, PharmD Clinical Pharmacist 05/10/2019 7:30 PM

## 2019-05-16 NOTE — Progress Notes (Signed)
[place] for  Heparin  Indication: atrial fibrillation  Allergies  Allergen Reactions  . Bee Venom Anaphylaxis  . Feldene [Piroxicam] Hives    Patient Measurements: Height: 5\' 8"  (172.7 cm) Weight: 134 lb 0.6 oz (60.8 kg) IBW/kg (Calculated) : 68.4 Heparin Dosing Weight:  67 kg   Vital Signs: Temp: 97.4 F (36.3 C) (01/26 1132) Temp Source: Oral (01/26 0859) BP: 132/87 (01/26 1132) Pulse Rate: 75 (01/26 1132)  Labs: Recent Labs    04/26/2019 1120 05/03/2019 1120 05/12/2019 1304 04/24/2019 1515 05/06/2019 1515 05/15/19 0004 05/15/19 1032 05/15/19 1704 05/19/2019 0425  HGB 13.4   < >  --   --   --   --  11.5*  --  11.1*  HCT 43.4  --   --   --   --   --  37.1*  --  36.1*  PLT 286  --   --   --   --   --  244  --  249  APTT  --    < >  --  28   < > 141* 79* 87* 38*  LABPROT  --   --   --  15.6*  --   --   --   --   --   INR  --   --   --  1.3*  --   --   --   --   --   HEPARINUNFRC  --   --   --   --   --  1.70* 1.32*  --  0.63  CREATININE 1.36*  --   --   --   --   --   --   --  1.17  TROPONINIHS 240*  --  249*  --   --   --   --   --   --    < > = values in this interval not displayed.    Estimated Creatinine Clearance: 44.7 mL/min (by C-G formula based on SCr of 1.17 mg/dL).   Medical History: Past Medical History:  Diagnosis Date  . Agent orange exposure   . Asthma    exercise induced asthma   . COPD (chronic obstructive pulmonary disease) ([place])   . DJD (degenerative joint disease)    a. 02/2019 s/p L TKA.  Marland Kitchen GERD (gastroesophageal reflux disease)   . Hearing loss    wears hearing aids  . History of cardiac cath    a. History of 2 cardiac catheterizations, last ~ 10 yrs ago @ [facility]-->reportedly nl.  . History of stress test    a. 2020 - pharmacologic stress testing @ [facility]-->reportedly nl.  . Hyperlipidemia   . Hypertension   . Hypothyroidism   . Insomnia   . Lung cancer ([place])    a. Dx 03/2019.  Marland Kitchen Pericardial effusion     a. 03/2019 Echo: EF 60-65%, no rwma, nl RV fxn. Mildly dil LA. Mod pericardia eff w/o tamponade. Mild Ao sclerosis.  . Prostate cancer ([place])   . PTSD (post-traumatic stress disorder)   . Seasonal allergies   . Thyroid disease     Medications:  Facility-Administered Medications Prior to Admission  Medication Dose Route Frequency Provider Last Rate Last Admin  . Leuprolide Acetate (6 Month) (LUPRON) injection 45 mg  45 mg Intramuscular Once Stoioff, Scott C, MD       Medications Prior to Admission  Medication Sig Dispense Refill Last Dose  . amiodarone (PACERONE) 200 MG tablet  Take 1 tablet (200 mg total) by mouth 2 (two) times daily. 60 tablet 0 Past Month at Unknown time  . apixaban (ELIQUIS) 5 MG TABS tablet Take 1 tablet (5 mg total) by mouth 2 (two) times daily. 60 tablet 0 Past Month at Unknown time  . cetirizine (ZYRTEC) 10 MG tablet Take 10 mg by mouth daily.    Past Month at Unknown time  . folic acid (FOLVITE) 1 MG tablet Take 1 mg by mouth daily.   Past Month at Unknown time  . folic acid (FOLVITE) 1 MG tablet Take 1 tablet (1 mg total) by mouth daily. Start 5-7 days before Alimta chemotherapy. Continue until 21 days after Alimta completed. 100 tablet 3 Past Month at Unknown time  . levothyroxine (SYNTHROID) 88 MCG tablet Take 88 mcg by mouth daily before breakfast.   Past Month at Unknown time  . megestrol (MEGACE) 40 MG tablet Take 1 tablet (40 mg total) by mouth daily. 30 tablet 1 Past Month at Unknown time  . ondansetron (ZOFRAN) 8 MG tablet Take 1 tablet (8 mg total) by mouth 2 (two) times daily as needed for refractory nausea / vomiting. 60 tablet 2 Past Month at Unknown time  . pravastatin (PRAVACHOL) 20 MG tablet Take 20 mg by mouth at bedtime.   Past Month at Unknown time  . prazosin (MINIPRESS) 5 MG capsule Take 10 mg by mouth at bedtime.    Past Month at Unknown time  . prochlorperazine (COMPAZINE) 10 MG tablet Take 1 tablet (10 mg total) by mouth every 6 (six) hours as  needed (Nausea or vomiting). 60 tablet 2 Past Month at Unknown time  . REFRESH TEARS 0.5 % SOLN Place 1 drop into both eyes 2 (two) times daily.    Past Month at prn  . sertraline (ZOLOFT) 50 MG tablet Take 50 mg by mouth at bedtime.    Past Month at Unknown time  . tamsulosin (FLOMAX) 0.4 MG CAPS capsule Take 0.4 mg by mouth daily.    Past Month at Unknown time  . traZODone (DESYREL) 50 MG tablet Take 50 mg by mouth at bedtime.   Past Month at Unknown time  . alum & mag hydroxide-simeth (MAALOX/MYLANTA) 200-200-20 MG/5ML suspension Take 30 mLs by mouth every 6 (six) hours as needed for indigestion or heartburn. 355 mL 0 prn at prn  . lidocaine-prilocaine (EMLA) cream Apply to affected area once 30 g 3 prn at prn  . ondansetron (ZOFRAN ODT) 4 MG disintegrating tablet Take 1 tablet (4 mg total) by mouth every 8 (eight) hours as needed for nausea or vomiting. (Patient not taking: Reported on 05/08/2019) 20 tablet 0 Completed Course at Unknown time  . polyethylene glycol (MIRALAX / GLYCOLAX) 17 g packet Take 17 g by mouth daily. 14 each 0 prn at prn    Assessment: Pharmacy consulted to dose heparin for AFib in this 79 year old male who is NPO.  Was on Eliquis PTA. Per RN, last dose was ~ 4 days ago.  CrCl = 42.4 ml/min  1/24 Heparin infusion started @ 1000 units/hr.   1/25 0004 HL 1.70, aPTT 170, SUPRAtherapeutic. Rate reduced to  750 units/hr.  1/25 @ 1032 HL: 1.32, aPTT: 79. Will dose by aPTT levels until HL and aPTT correlate (therapeutic x 1)   01/26 @ 0500 aPTT 38 seconds, subtherapeutic; was not correlating w/ HL; however, heparin drip was stopped for planned EGD today. Will need to f/u on further plans for anti-coagulation post-EGD.  Goal of Therapy:  APTT: 66-102  Heparin level 0.3-0.7 units/ml Monitor platelets by anticoagulation protocol: Yes   Plan:  01/26 Heparin drip restarted after EGD (stopped at 0300 and restarted at 1231). Note that aPTT from this am (38 at 0425) drawn 1.5  hours after Heparin drip stopped. Will resume drip at 750 units/hr and check aPTT in 6 hours. F/u HL with labs tomorrow to see if correlate.   Noralee Space, PharmD Clinical Pharmacist 04/23/2019 1:17 PM

## 2019-05-16 NOTE — Progress Notes (Signed)
ANTICOAGULATION CONSULT NOTE - Initial Consult  Pharmacy Consult for  Heparin  Indication: atrial fibrillation  Allergies  Allergen Reactions  . Bee Venom Anaphylaxis  . Feldene [Piroxicam] Hives    Patient Measurements: Height: 5\' 8"  (172.7 cm) Weight: 147 lb 12 oz (67 kg) IBW/kg (Calculated) : 68.4 Heparin Dosing Weight:  67 kg   Vital Signs: Temp: 97.6 F (36.4 C) (01/25 2326) Temp Source: Oral (01/25 2326) BP: 147/78 (01/25 2326) Pulse Rate: 66 (01/25 2326)  Labs: Recent Labs    05/08/2019 1120 05/15/2019 1120 05/11/2019 1304 05/04/2019 1515 04/24/2019 1515 05/15/19 0004 05/15/19 1032 05/15/19 1704 04/24/2019 0425  HGB 13.4   < >  --   --   --   --  11.5*  --  11.1*  HCT 43.4  --   --   --   --   --  37.1*  --  36.1*  PLT 286  --   --   --   --   --  244  --  249  APTT  --    < >  --  28   < > 141* 79* 87* 38*  LABPROT  --   --   --  15.6*  --   --   --   --   --   INR  --   --   --  1.3*  --   --   --   --   --   HEPARINUNFRC  --   --   --   --   --  1.70* 1.32*  --  0.63  CREATININE 1.36*  --   --   --   --   --   --   --  1.17  TROPONINIHS 240*  --  249*  --   --   --   --   --   --    < > = values in this interval not displayed.    Estimated Creatinine Clearance: 49.3 mL/min (by C-G formula based on SCr of 1.17 mg/dL).   Medical History: Past Medical History:  Diagnosis Date  . Agent orange exposure   . Asthma    exercise induced asthma   . COPD (chronic obstructive pulmonary disease) ([place])   . DJD (degenerative joint disease)    a. 02/2019 s/p L TKA.  Marland Kitchen GERD (gastroesophageal reflux disease)   . Hearing loss    wears hearing aids  . History of cardiac cath    a. History of 2 cardiac catheterizations, last ~ 10 yrs ago @ [facility]-->reportedly nl.  . History of stress test    a. 2020 - pharmacologic stress testing @ [facility]-->reportedly nl.  . Hyperlipidemia   . Hypertension   . Hypothyroidism   . Insomnia   . Lung cancer ([place])    a. Dx 03/2019.  Marland Kitchen  Pericardial effusion    a. 03/2019 Echo: EF 60-65%, no rwma, nl RV fxn. Mildly dil LA. Mod pericardia eff w/o tamponade. Mild Ao sclerosis.  . Prostate cancer ([place])   . PTSD (post-traumatic stress disorder)   . Seasonal allergies   . Thyroid disease     Medications:  Facility-Administered Medications Prior to Admission  Medication Dose Route Frequency Provider Last Rate Last Admin  . Leuprolide Acetate (6 Month) (LUPRON) injection 45 mg  45 mg Intramuscular Once Stoioff, Scott C, MD       Medications Prior to Admission  Medication Sig Dispense Refill Last Dose  . amiodarone (PACERONE)  200 MG tablet Take 1 tablet (200 mg total) by mouth 2 (two) times daily. 60 tablet 0 Past Month at Unknown time  . apixaban (ELIQUIS) 5 MG TABS tablet Take 1 tablet (5 mg total) by mouth 2 (two) times daily. 60 tablet 0 Past Month at Unknown time  . cetirizine (ZYRTEC) 10 MG tablet Take 10 mg by mouth daily.    Past Month at Unknown time  . folic acid (FOLVITE) 1 MG tablet Take 1 mg by mouth daily.   Past Month at Unknown time  . folic acid (FOLVITE) 1 MG tablet Take 1 tablet (1 mg total) by mouth daily. Start 5-7 days before Alimta chemotherapy. Continue until 21 days after Alimta completed. 100 tablet 3 Past Month at Unknown time  . levothyroxine (SYNTHROID) 88 MCG tablet Take 88 mcg by mouth daily before breakfast.   Past Month at Unknown time  . megestrol (MEGACE) 40 MG tablet Take 1 tablet (40 mg total) by mouth daily. 30 tablet 1 Past Month at Unknown time  . ondansetron (ZOFRAN) 8 MG tablet Take 1 tablet (8 mg total) by mouth 2 (two) times daily as needed for refractory nausea / vomiting. 60 tablet 2 Past Month at Unknown time  . pravastatin (PRAVACHOL) 20 MG tablet Take 20 mg by mouth at bedtime.   Past Month at Unknown time  . prazosin (MINIPRESS) 5 MG capsule Take 10 mg by mouth at bedtime.    Past Month at Unknown time  . prochlorperazine (COMPAZINE) 10 MG tablet Take 1 tablet (10 mg total) by mouth  every 6 (six) hours as needed (Nausea or vomiting). 60 tablet 2 Past Month at Unknown time  . REFRESH TEARS 0.5 % SOLN Place 1 drop into both eyes 2 (two) times daily.    Past Month at prn  . sertraline (ZOLOFT) 50 MG tablet Take 50 mg by mouth at bedtime.    Past Month at Unknown time  . tamsulosin (FLOMAX) 0.4 MG CAPS capsule Take 0.4 mg by mouth daily.    Past Month at Unknown time  . traZODone (DESYREL) 50 MG tablet Take 50 mg by mouth at bedtime.   Past Month at Unknown time  . alum & mag hydroxide-simeth (MAALOX/MYLANTA) 200-200-20 MG/5ML suspension Take 30 mLs by mouth every 6 (six) hours as needed for indigestion or heartburn. 355 mL 0 prn at prn  . lidocaine-prilocaine (EMLA) cream Apply to affected area once 30 g 3 prn at prn  . ondansetron (ZOFRAN ODT) 4 MG disintegrating tablet Take 1 tablet (4 mg total) by mouth every 8 (eight) hours as needed for nausea or vomiting. (Patient not taking: Reported on 05/19/2019) 20 tablet 0 Completed Course at Unknown time  . polyethylene glycol (MIRALAX / GLYCOLAX) 17 g packet Take 17 g by mouth daily. 14 each 0 prn at prn    Assessment: Pharmacy consulted to dose heparin for AFib in this 79 year old male who is NPO.  Was on Eliquis PTA. Per RN, last dose was ~ 4 days ago.  CrCl = 42.4 ml/min  1/24 Heparin infusion started @ 1000 units/hr.   1/25 0004 HL 1.70, aPTT 170, SUPRAtherapeutic. Rate reduced to  750 units/hr.  1/25 @ 1032 HL: 1.32, aPTT: 79. Will dose by aPTT levels until HL and aPTT correlate (therapeutic x 1)   Goal of Therapy:  APTT: 66-102  Heparin level 0.3-0.7 units/ml Monitor platelets by anticoagulation protocol: Yes   Plan:  01/26 @ 0500 aPTT 38 seconds, subtherapeutic; was  not correlating w/ HL; however, heparin drip was stopped for planned EGD today. Will need to f/u on further plans for anti-coagulation post-EGD.  Tobie Lords, PharmD, BCPS Clinical Pharmacist 04/30/2019 6:57 AM

## 2019-05-16 NOTE — Progress Notes (Signed)
PROGRESS NOTE    Carlos Barrera  ZMO:[id number] DOB: 1940-05-16 DOA: 05/06/2019 PCP: [facility]    Brief Narrative:  Carlos Barrera a 79 y.o.malewith a hx of 79 y.o.malewith a hx of atrial fibrillation, COPD, HTN, HLD, agent orange exposure, lung cancer, remote prostate cancer, hypothyroidism, remote tobacco abuse (40 pack years, quit 1982)presented with dysphagia of projectile vomiting.   Consultants:   GI  Procedures: Barium swallow 1. Examination limited as detailed above. Initial swallows of barium are seen to be grossly retained at the midesophagus with tight, strictured appearing caliber of the esophagus at the level of the left mainstem bronchus. 2. No evidence of penetration or aspiration. Antimicrobials:   None   Subjective: Patient is status post EGD.  He is passing gas.  Denies any pain.  Objective: Vitals:   05/15/19 0801 05/15/19 1150 05/15/19 1539 05/15/19 2326  BP: 129/89 135/78 (!) 146/80 (!) 147/78  Pulse: 65 69 70 66  Resp:    16  Temp: 97.7 F (36.5 C)  97.9 F (36.6 C) 97.6 F (36.4 C)  TempSrc: Oral  Oral Oral  SpO2: 93% 98% 93% 92%  Weight:      Height:        Intake/Output Summary (Last 24 hours) at 05/08/2019 0737 Last data filed at 05/15/2019 2317 Gross per 24 hour  Intake 1587.5 ml  Output --  Net 1587.5 ml   Filed Weights   04/29/2019 1006  Weight: 67 kg    Examination:  General exam: Appears calm and comfortable, NAD Respiratory system: Clear to auscultation. Respiratory effort normal.  No wheeze rales rhonchi's Cardiovascular system: S1 & S2 heard, RRR. No murmurs, rubs, gallops or clicks.  Gastrointestinal system: Abdomen is nondNo organomegaly or masses felt. Normal bowel sounds heard. Central nervous system: Alert and oriented x3. No focal neurological deficits. Extremities: No edema or cyanosis Skin: Warm dry Psychiatry: Judgement and insight appear normal. Mood & affect appropriate.     Data  Reviewed: I have personally reviewed following labs and imaging studies  CBC: Recent Labs  Lab 05/04/2019 1120 05/15/19 1032 05/08/2019 0425  WBC 5.2 4.8 4.7  HGB 13.4 11.5* 11.1*  HCT 43.4 37.1* 36.1*  MCV 95.6 97.1 96.0  PLT 286 244 035   Basic Metabolic Panel: Recent Labs  Lab 05/15/2019 1120 04/28/2019 0425  NA 143 143  K 4.0 3.5  CL 106 107  CO2 27 26  GLUCOSE 97 91  BUN 21 18  CREATININE 1.36* 1.17  CALCIUM 10.8* 10.1   GFR: Estimated Creatinine Clearance: 49.3 mL/min (by C-G formula based on SCr of 1.17 mg/dL). Liver Function Tests: Recent Labs  Lab 04/30/2019 1120  AST 18  ALT 10  ALKPHOS 86  BILITOT 0.7  PROT 7.3  ALBUMIN 3.6   Recent Labs  Lab 04/22/2019 1120  LIPASE 29   No results for input(s): AMMONIA in the last 168 hours. Coagulation Profile: Recent Labs  Lab 05/12/2019 1515  INR 1.3*   Cardiac Enzymes: No results for input(s): CKTOTAL, CKMB, CKMBINDEX, TROPONINI in the last 168 hours. BNP (last 3 results) No results for input(s): PROBNP in the last 8760 hours. HbA1C: No results for input(s): HGBA1C in the last 72 hours. CBG: Recent Labs  Lab 05/09/19 1051  GLUCAP 103*   Lipid Profile: No results for input(s): CHOL, HDL, LDLCALC, TRIG, CHOLHDL, LDLDIRECT in the last 72 hours. Thyroid Function Tests: No results for input(s): TSH, T4TOTAL, FREET4, T3FREE, THYROIDAB in the last 72 hours. Anemia  Panel: No results for input(s): VITAMINB12, FOLATE, FERRITIN, TIBC, IRON, RETICCTPCT in the last 72 hours. Sepsis Labs: No results for input(s): PROCALCITON, LATICACIDVEN in the last 168 hours.  Recent Results (from the past 240 hour(s))  Respiratory Panel by RT PCR (Flu A&B, Covid) - Nasopharyngeal Swab     Status: None   Collection Time: 05/17/2019 11:20 AM   Specimen: Nasopharyngeal Swab  Result Value Ref Range Status   SARS Coronavirus 2 by RT PCR NEGATIVE NEGATIVE Final    Comment: (NOTE) SARS-CoV-2 target nucleic acids are NOT DETECTED. The  SARS-CoV-2 RNA is generally detectable in upper respiratoy specimens during the acute phase of infection. The lowest concentration of SARS-CoV-2 viral copies this assay can detect is 131 copies/mL. A negative result does not preclude SARS-Cov-2 infection and should not be used as the sole basis for treatment or other patient management decisions. A negative result may occur with  improper specimen collection/handling, submission of specimen other than nasopharyngeal swab, presence of viral mutation(s) within the areas targeted by this assay, and inadequate number of viral copies (<131 copies/mL). A negative result must be combined with clinical observations, patient history, and epidemiological information. The expected result is Negative. Fact Sheet for Patients:  PinkCheek.be Fact Sheet for Healthcare Providers:  GravelBags.it This test is not yet ap proved or cleared by the [place] FDA and  has been authorized for detection and/or diagnosis of SARS-CoV-2 by FDA under an Emergency Use Authorization (EUA). This EUA will remain  in effect (meaning this test can be used) for the duration of the COVID-19 declaration under Section 564(b)(1) of the Act, 21 U.S.C. section 360bbb-3(b)(1), unless the authorization is terminated or revoked sooner.    Influenza A by PCR NEGATIVE NEGATIVE Final   Influenza B by PCR NEGATIVE NEGATIVE Final    Comment: (NOTE) The Xpert Xpress SARS-CoV-2/FLU/RSV assay is intended as an aid in  the diagnosis of influenza from Nasopharyngeal swab specimens and  should not be used as a sole basis for treatment. Nasal washings and  aspirates are unacceptable for Xpert Xpress SARS-CoV-2/FLU/RSV  testing. Fact Sheet for Patients: PinkCheek.be Fact Sheet for Healthcare Providers: GravelBags.it This test is not yet approved or cleared by the [place] FDA and  has been authorized for detection and/or diagnosis of SARS-CoV-2 by  FDA under an Emergency Use Authorization (EUA). This EUA will remain  in effect (meaning this test can be used) for the duration of the  Covid-19 declaration under Section 564(b)(1) of the Act, 21  U.S.C. section 360bbb-3(b)(1), unless the authorization is  terminated or revoked. Performed at [facility], [address], [place], [place] [address]          Radiology Studies: DG Chest Portable 1 View  Result Date: 05/03/2019 CLINICAL DATA:  Nausea, vomiting and weakness for 2 weeks. History of lung carcinoma. EXAM: PORTABLE CHEST 1 VIEW COMPARISON:  PA and lateral chest 04/27/2019.  CT chest 04/15/2019. FINDINGS: The lungs are emphysematous but clear. There is some volume loss in the right chest. No pneumothorax or pleural effusion. Heart size is normal. Fullness of the right hilum is consistent with lymphadenopathy seen on prior CT. IMPRESSION: No acute disease. Emphysema. Right hilar fullness consistent with lymphadenopathy seen on prior CT. Electronically Signed   By: Inge Rise M.D.   On: 04/23/2019 10:56   DG ESOPHAGUS W SINGLE CM (SOL OR THIN BA)  Result Date: 05/15/2019 CLINICAL DATA:  Dysphagia EXAM: ESOPHOGRAM/BARIUM SWALLOW TECHNIQUE: Single contrast examination was  performed using  thick barium. FLUOROSCOPY TIME:  Fluoroscopy Time:  00:30 Number of Acquired Spot Images: 25 COMPARISON:  PET-CT, 05/09/2019 FINDINGS: Single contrast barium swallow examination was performed under fluoroscopy. Examination is generally limited by patient nausea and intolerance of barium. There is no evidence of penetration or aspiration on initial oropharyngeal phase of swallow. Initial swallows of barium are seen to be grossly retained at the midesophagus with tight, strictured appearing caliber of the esophagus at the level of the left mainstem bronchus. Examination was ended at this point due to  identification of obstruction and patient inability to tolerate further oral ingestion. The stomach and proximal small bowel are not assessed. IMPRESSION: 1. Examination limited as detailed above. Initial swallows of barium are seen to be grossly retained at the midesophagus with tight, strictured appearing caliber of the esophagus at the level of the left mainstem bronchus. 2. No evidence of penetration or aspiration. Electronically Signed   By: Eddie Candle M.D.   On: 05/15/2019 09:06        Scheduled Meds: Continuous Infusions: . dextrose 5 % and 0.45% NaCl 75 mL/hr at 05/13/2019 0419  . heparin Stopped (05/19/2019 0300)    Assessment & Plan:   Active Problems:   Adenocarcinoma of right lung (HCC)   Paroxysmal atrial fibrillation (HCC)   Intractable vomiting   Pericardial effusion   Hypertension   Dysphagia   #1 dysphagia and projectile vomiting Barium swallow with midesophagus with tight stricture please see full report GI following-status post EGD with dilatation site showed no change.  Fluid in the upper third of the esophagus.  At this point GI recommends PEG tube placement.  2.essent HTN-stable  3.  PAF-on amiodarone.  On IV as p.o. is held due to his dysphagia.  But now iv d/c'd per nsg pt has to be in [facility] for this.  Will monitor HR closely for now as he is loaded with amiodarone and hopefully he will be ok by Friday to get his peg. Continue heparin gtt, stop at [facility] Friday prior to IR procedure .  #4 right lung adenocarcinoma Patient wants his oncologist to be consulted, I have let Dr. Grayland Ormond know that patient is here and he will need a port apparently for chemotherapy to be done as outpatient  #5 AKI-prerenal from decreased p.o. intake and projectile vomiting Improved Continue on IV fluids since n.p.o. Avoid nephrotoxic drugs Monitor closely   DVT prophylaxis:, Heparin then SCD Code Status: Full Family Communication: None Disposition Plan: plan for PEG and port  placement on Friday.  Will need his heparin drip to be held at 6 AM on Friday morning prior to his procedures.  likely can be DC'd after that.      LOS: 2 days   Time spent: 45 minutes with more than 50% on Ruf[place], MD [facility] Pager [phone]  If 7PM-7AM, please contact night-coverage [email or web address] Password [facility] 05/09/2019, 7:37 AM Patient ID: Carlos Barrera, male   DOB: 01/22/1941, 79 y.o.   MRN: [id number]

## 2019-05-17 ENCOUNTER — Other Ambulatory Visit: Payer: Self-pay | Admitting: Radiology

## 2019-05-17 ENCOUNTER — Encounter: Payer: Self-pay | Admitting: *Deleted

## 2019-05-17 DIAGNOSIS — R0600 Dyspnea, unspecified: Secondary | ICD-10-CM

## 2019-05-17 DIAGNOSIS — E43 Unspecified severe protein-calorie malnutrition: Secondary | ICD-10-CM | POA: Insufficient documentation

## 2019-05-17 LAB — CBC
HCT: 32.6 % — ABNORMAL LOW (ref 39.0–52.0)
Hemoglobin: 10.4 g/dL — ABNORMAL LOW (ref 13.0–17.0)
MCH: 30.2 pg (ref 26.0–34.0)
MCHC: 31.9 g/dL (ref 30.0–36.0)
MCV: 94.8 fL (ref 80.0–100.0)
Platelets: 222 10*3/uL (ref 150–400)
RBC: 3.44 MIL/uL — ABNORMAL LOW (ref 4.22–5.81)
RDW: 16.1 % — ABNORMAL HIGH (ref 11.5–15.5)
WBC: 4.4 10*3/uL (ref 4.0–10.5)
nRBC: 0 % (ref 0.0–0.2)

## 2019-05-17 LAB — HEPARIN LEVEL (UNFRACTIONATED): Heparin Unfractionated: 0.79 IU/mL — ABNORMAL HIGH (ref 0.30–0.70)

## 2019-05-17 LAB — APTT
aPTT: 92 seconds — ABNORMAL HIGH (ref 24–36)
aPTT: 87 seconds — ABNORMAL HIGH (ref 24–36)

## 2019-05-17 MED ORDER — LORAZEPAM 2 MG/ML IJ SOLN
1.0000 mg | Freq: Every evening | INTRAMUSCULAR | Status: DC | PRN
  Administered 2019-05-17 – 2019-05-21 (×5): 1 mg via INTRAVENOUS
  Filled 2019-05-17 (×5): qty 1

## 2019-05-17 NOTE — Progress Notes (Signed)
Carlos Barrera, Carlos [place]    LOS - 3   Brief Narrative:  Carlos Bacon Howlandis a 79 y.o.malewith a hx of 79 y.o.malewith a hx of atrial fibrillation on amiodarone and Barrera, Carlos Barrera, Carlos Barrera, HLD, agent orange exposure, lung cancer, remote prostate cancer, hypothyroidism, remote tobacco abuse (40 pack years, quit 1982)presented with dysphagia to solids and liquids and  projectile vomiting.  Barium esophagram showed barium to be grossly retained at the midesophagus with tight, strictured appearing caliber of the esophagus at the level of the left mainstem bronchus.  Subsequently taken for EGD by gastroenterology.  Upper 1/3 of esophagus with retained food.  Dilation was attempted unsuccessfully.  Narrowing of esophagus is extrinsic  Plan is for PEG tube and port placement on Friday 1/29.   Subjective 1/27: Patient reports unable to fall asleep last night.  Requests that sleep medicine be given earlier.  Says medicine starting with "A" helped last night.  Does not take anything PO due to esophageal stenosis, needs IV medicine.  Otherwise, patient denies issues today including fever/chills, N/V, SOB, CP.  No acute events reported.  Assessment & Plan:   Active Problems:   Adenocarcinoma of right lung (HCC)   Paroxysmal atrial fibrillation (HCC)   Intractable vomiting   Pericardial effusion   Hypertension   Dysphagia   Problems with swallowing and mastication   Stricture and stenosis of esophagus   Protein-calorie malnutrition, severe   Dysphagia secondary to esophageal stenosis/stricture See narrative about for hospital course to date.  Esophageal narrowing due to extrinsic cause, possibly compression by tumor/adenopathy (see PET scan report of 1/19). --PEG tube placement planned for Friday 1/29 --stop heparin --dietician for tube feed regimen  Essential Hypertension - chronic, stable without  antihypertensives  Paroxysmal A-fib - rate controlled --hold PO amiodarone (NPO due to dysphagia) --if becomes uncontrolled, will need amiodarone gtt (in stepdown/ICU) --Barrera on hold for now --continue heparin gtt, stop at 6:00 AM Friday 1/29.   Stage IV adenocarcinoma of the lung, recently diagnosed, widespread bony mets --followed by Dr. Grayland Ormond, consulted --chemo port to be placed 1/29 --planning to start palliative chemo Feb 4th  Acute Kidney Injury - prerenal azotemia from decreased p.o. intake and projectile vomiting Improved.  Continue on IV fluids since n.p.o.  Avoid nephrotoxic drugs. --monitor BMP daily   DVT prophylaxis: on heparin gtt   Code Status: Full Code  Family Communication: none at bedside  Disposition Plan:  Expect medically ready for discharge after port and PEG placed on Friday 1/29.  Possible discharge 1/30, pending clearance by GI.   Consultants:   Gastroenterology  Oncology  Procedures:  EGD 1/26  Antimicrobials:   None    Objective: Vitals:   05/03/2019 1557 05/17/19 0011 05/17/19 0245 05/17/19 1007  BP: (!) 142/85 (!) 154/75  133/73  Pulse: 66 64 64 62  Resp: 17 18  12   Temp: 97.8 F (36.6 C) (!) 97.4 F (36.3 C)  (!) 97.5 F (36.4 C)  TempSrc: Oral Oral  Oral  SpO2: 96% 96% 95% 98%  Weight:      Height:        Intake/Output Summary (Last 24 hours) at 05/17/2019 1135 Last data filed at 05/17/2019 0900 Gross per 24 hour  Intake 1017.37 ml  Output 425 ml  Net 592.37 ml   Filed Weights   04/30/2019 1006 05/15/2019 0859  Weight: 67 kg 60.8 kg    Examination:  General exam: awake, alert, no acute distress, frail, underweight HEENT: clear conjunctiva, anicteric sclera, dry mucus membranes, hearing grossly normal  Respiratory system: clear to auscultation bilaterally, no wheezes, rales or rhonchi, normal respiratory effort. Cardiovascular system: normal S1/S2, RRR, no JVD, murmurs, rubs, gallops, no pedal edema.    Gastrointestinal system: soft, non-tender, non-distended abdomen, no organomegaly or masses felt, bowel sounds positive. Central nervous system: alert and oriented x3. no gross focal neurologic deficits, normal speech Extremities: moves all, no cyanosis, normal tone Skin: dry, intact, normal temperature Psychiatry: normal mood, congruent affect, judgement and insight appear normal    Data Reviewed: I have personally reviewed following labs and imaging studies  CBC: Recent Labs  Lab 04/24/2019 1120 05/15/19 1032 05/15/2019 0425 05/17/19 0201  WBC 5.2 4.8 4.7 4.4  HGB 13.4 11.5* 11.1* 10.4*  HCT 43.4 37.1* 36.1* 32.6*  MCV 95.6 97.1 96.0 94.8  PLT 286 244 249 160   Basic Metabolic Panel: Recent Labs  Lab 05/07/2019 1120 05/19/2019 0425  NA 143 143  K 4.0 3.5  CL 106 107  CO2 27 26  GLUCOSE 97 91  BUN 21 18  CREATININE 1.36* 1.17  CALCIUM 10.8* 10.1   GFR: Estimated Creatinine Clearance: 44.7 mL/min (by C-G formula based on SCr of 1.17 mg/dL). Liver Function Tests: Recent Labs  Lab 05/12/2019 1120  AST 18  ALT 10  ALKPHOS 86  BILITOT 0.7  PROT 7.3  ALBUMIN 3.6   Recent Labs  Lab 05/06/2019 1120  LIPASE 29   No results for input(s): AMMONIA in the last 168 hours. Coagulation Profile: Recent Labs  Lab 05/11/2019 1515  INR 1.3*   Cardiac Enzymes: No results for input(s): CKTOTAL, CKMB, CKMBINDEX, TROPONINI in the last 168 hours. BNP (last 3 results) No results for input(s): PROBNP in the last 8760 hours. HbA1C: No results for input(s): HGBA1C in the last 72 hours. CBG: No results for input(s): GLUCAP in the last 168 hours. Lipid Profile: No results for input(s): CHOL, HDL, LDLCALC, TRIG, CHOLHDL, LDLDIRECT in the last 72 hours. Thyroid Function Tests: No results for input(s): TSH, T4TOTAL, FREET4, T3FREE, THYROIDAB in the last 72 hours. Anemia Panel: No results for input(s): VITAMINB12, FOLATE, FERRITIN, TIBC, IRON, RETICCTPCT in the last 72 hours. Sepsis  Labs: No results for input(s): PROCALCITON, LATICACIDVEN in the last 168 hours.  Recent Results (from the past 240 hour(s))  Respiratory Panel by RT PCR (Flu A&B, Covid) - Nasopharyngeal Swab     Status: None   Collection Time: 04/21/2019 11:20 AM   Specimen: Nasopharyngeal Swab  Result Value Ref Range Status   SARS Coronavirus 2 by RT PCR NEGATIVE NEGATIVE Final    Comment: (NOTE) SARS-CoV-2 target nucleic acids are NOT DETECTED. The SARS-CoV-2 RNA is generally detectable in upper respiratoy specimens during the acute phase of infection. The lowest concentration of SARS-CoV-2 viral copies this assay can detect is 131 copies/mL. A negative result does not preclude SARS-Cov-2 infection and should not be used as the sole basis for treatment or other patient management decisions. A negative result may occur with  improper specimen collection/handling, submission of specimen other than nasopharyngeal swab, presence of viral mutation(s) within the areas targeted by this assay, and inadequate number of viral copies (<131 copies/mL). A negative result must be combined with clinical observations, patient history, and epidemiological information. The expected result is Negative. Fact Sheet for Patients:  PinkCheek.be Fact Sheet for Healthcare Providers:  GravelBags.it This test is not yet ap proved or cleared by the  [place] Architectural technologist and  has been authorized for detection and/or diagnosis of SARS-CoV-2 by FDA under an Print production planner (EUA). This EUA will remain  in effect (meaning this test can be used) for the duration of the COVID-19 declaration under Section 564(b)(1) of the Act, 21 U.S.C. section 360bbb-3(b)(1), unless the authorization is terminated or revoked sooner.    Influenza A by PCR NEGATIVE NEGATIVE Final   Influenza B by PCR NEGATIVE NEGATIVE Final    Comment: (NOTE) The Xpert Xpress SARS-CoV-2/FLU/RSV assay  is intended as an aid in  the diagnosis of influenza from Nasopharyngeal swab specimens and  should not be used as a sole basis for treatment. Nasal washings and  aspirates are unacceptable for Xpert Xpress SARS-CoV-2/FLU/RSV  testing. Fact Sheet for Patients: PinkCheek.be Fact Sheet for Healthcare Providers: GravelBags.it This test is not yet approved or cleared by the [place] FDA and  has been authorized for detection and/or diagnosis of SARS-CoV-2 by  FDA under an Emergency Use Authorization (EUA). This EUA will remain  in effect (meaning this test can be used) for the duration of the  Covid-19 declaration under Section 564(b)(1) of the Act, 21  U.S.C. section 360bbb-3(b)(1), unless the authorization is  terminated or revoked. Performed at [facility], [address] [place], [place] [address]          Radiology Studies: No results found.      Scheduled Meds: Continuous Infusions: . sodium chloride Stopped (05/19/2019 1204)  . dextrose 5 % and 0.45% NaCl 75 mL/hr at 05/17/19 0756  . heparin 850 Units/hr (05/13/2019 2049)     LOS: 3 days    Time spent: 35 minutes    Ezekiel Slocumb, DO [facility]   If 7PM-7AM, please contact night-coverage [email or web address] 05/17/2019, 11:35 AM

## 2019-05-17 NOTE — Progress Notes (Signed)
[facility] for  Heparin  Indication: atrial fibrillation  Allergies  Allergen Reactions  . Bee Venom Anaphylaxis  . Feldene [Piroxicam] Hives    Patient Measurements: Height: 5\' 8"  (172.7 cm) Weight: 134 lb 0.6 oz (60.8 kg) IBW/kg (Calculated) : 68.4 Heparin Dosing Weight:  67 kg   Vital Signs: Temp: 97.4 F (36.3 C) (01/27 0011) Temp Source: Oral (01/27 0011) BP: 154/75 (01/27 0011) Pulse Rate: 64 (01/27 0011)  Labs: Recent Labs     0000 04/30/2019 1120 05/06/2019 1304 04/27/2019 1515 05/15/19 0004 05/15/19 1032 05/15/19 1704 04/24/2019 0425 05/10/2019 1841 05/17/19 0201  HGB   < > 13.4  --   --   --  11.5*  --  11.1*  --  10.4*  HCT   < > 43.4  --   --   --  37.1*  --  36.1*  --  32.6*  PLT   < > 286  --   --   --  244  --  249  --  222  APTT  --   --   --  28   < > 79*   < > 38* 55* 92*  LABPROT  --   --   --  15.6*  --   --   --   --   --   --   INR  --   --   --  1.3*  --   --   --   --   --   --   HEPARINUNFRC  --   --   --   --    < > 1.32*  --  0.63  --  0.79*  CREATININE  --  1.36*  --   --   --   --   --  1.17  --   --   TROPONINIHS  --  240* 249*  --   --   --   --   --   --   --    < > = values in this interval not displayed.    Estimated Creatinine Clearance: 44.7 mL/min (by C-G formula based on SCr of 1.17 mg/dL).   Medical History: Past Medical History:  Diagnosis Date  . Agent orange exposure   . Asthma    exercise induced asthma   . COPD (chronic obstructive pulmonary disease) ([place])   . DJD (degenerative joint disease)    a. 02/2019 s/p L TKA.  Marland Kitchen GERD (gastroesophageal reflux disease)   . Hearing loss    wears hearing aids  . History of cardiac cath    a. History of 2 cardiac catheterizations, last ~ 10 yrs ago @ [facility]-->reportedly nl.  . History of stress test    a. 2020 - pharmacologic stress testing @ [facility]-->reportedly nl.  . Hyperlipidemia   . Hypertension   . Hypothyroidism   . Insomnia   . Lung  cancer ([place])    a. Dx 03/2019.  Marland Kitchen Pericardial effusion    a. 03/2019 Echo: EF 60-65%, no rwma, nl RV fxn. Mildly dil LA. Mod pericardia eff w/o tamponade. Mild Ao sclerosis.  . Prostate cancer ([place])   . PTSD (post-traumatic stress disorder)   . Seasonal allergies   . Thyroid disease     Medications:  Facility-Administered Medications Prior to Admission  Medication Dose Route Frequency Provider Last Rate Last Admin  . Leuprolide Acetate (6 Month) (LUPRON) injection 45 mg  45 mg Intramuscular Once Stoioff, [employer]  C, MD       Medications Prior to Admission  Medication Sig Dispense Refill Last Dose  . amiodarone (PACERONE) 200 MG tablet Take 1 tablet (200 mg total) by mouth 2 (two) times daily. 60 tablet 0 Past Month at Unknown time  . apixaban (ELIQUIS) 5 MG TABS tablet Take 1 tablet (5 mg total) by mouth 2 (two) times daily. 60 tablet 0 Past Month at Unknown time  . cetirizine (ZYRTEC) 10 MG tablet Take 10 mg by mouth daily.    Past Month at Unknown time  . folic acid (FOLVITE) 1 MG tablet Take 1 mg by mouth daily.   Past Month at Unknown time  . folic acid (FOLVITE) 1 MG tablet Take 1 tablet (1 mg total) by mouth daily. Start 5-7 days before Alimta chemotherapy. Continue until 21 days after Alimta completed. 100 tablet 3 Past Month at Unknown time  . levothyroxine (SYNTHROID) 88 MCG tablet Take 88 mcg by mouth daily before breakfast.   Past Month at Unknown time  . megestrol (MEGACE) 40 MG tablet Take 1 tablet (40 mg total) by mouth daily. 30 tablet 1 Past Month at Unknown time  . ondansetron (ZOFRAN) 8 MG tablet Take 1 tablet (8 mg total) by mouth 2 (two) times daily as needed for refractory nausea / vomiting. 60 tablet 2 Past Month at Unknown time  . pravastatin (PRAVACHOL) 20 MG tablet Take 20 mg by mouth at bedtime.   Past Month at Unknown time  . prazosin (MINIPRESS) 5 MG capsule Take 10 mg by mouth at bedtime.    Past Month at Unknown time  . prochlorperazine (COMPAZINE) 10 MG tablet  Take 1 tablet (10 mg total) by mouth every 6 (six) hours as needed (Nausea or vomiting). 60 tablet 2 Past Month at Unknown time  . REFRESH TEARS 0.5 % SOLN Place 1 drop into both eyes 2 (two) times daily.    Past Month at prn  . sertraline (ZOLOFT) 50 MG tablet Take 50 mg by mouth at bedtime.    Past Month at Unknown time  . tamsulosin (FLOMAX) 0.4 MG CAPS capsule Take 0.4 mg by mouth daily.    Past Month at Unknown time  . traZODone (DESYREL) 50 MG tablet Take 50 mg by mouth at bedtime.   Past Month at Unknown time  . alum & mag hydroxide-simeth (MAALOX/MYLANTA) 200-200-20 MG/5ML suspension Take 30 mLs by mouth every 6 (six) hours as needed for indigestion or heartburn. 355 mL 0 prn at prn  . lidocaine-prilocaine (EMLA) cream Apply to affected area once 30 g 3 prn at prn  . ondansetron (ZOFRAN ODT) 4 MG disintegrating tablet Take 1 tablet (4 mg total) by mouth every 8 (eight) hours as needed for nausea or vomiting. (Patient not taking: Reported on 05/18/2019) 20 tablet 0 Completed Course at Unknown time  . polyethylene glycol (MIRALAX / GLYCOLAX) 17 g packet Take 17 g by mouth daily. 14 each 0 prn at prn    Assessment: Pharmacy consulted to dose heparin for AFib in this 79 year old male who is NPO.  Was on Eliquis PTA. Per RN, last dose was ~ 4 days ago.  CrCl = 42.4 ml/min  1/26 0425 aPTT 38 seconds, subtherapeutic; was not correlating w/ HL; however, heparin drip was stopped for planned EGD today. Will need to f/u on further plans for anti-coagulation post-EGD.  1/26 1841 apTT 55. Heparin restarted at 750 at 1231. Will bolus 1000 units and increase rate to 850 units/hr.  Goal of Therapy:  APTT: 66-102  Heparin level 0.3-0.7 units/ml Monitor platelets by anticoagulation protocol: Yes   Plan:  01/27 @ 0200 aPTT 92 seconds, therapeutic. Will continue current rate and will recheck aPTT at 0800 and HL w/ am labs, CBC continues to trend down slightly will continue to monitor.   Tobie Lords,  PharmD Clinical Pharmacist 05/17/2019 3:50 AM

## 2019-05-17 NOTE — Progress Notes (Signed)
Pulmonary Individual Treatment Plan  Patient Details  Name: Carlos Barrera MRN: [id number] Date of Birth: Feb 26, 1941 Referring Provider:     Pulmonary Rehab from 12/07/2018 in [facility] Card[facility] Pulmonary Rehab  Referring Provider  [name]      Initial Encounter Date:    Pulmonary Rehab from 12/07/2018 in [facility] Cardiac and Pulmonary Rehab  Date  12/07/18      Visit Diagnosis: Dyspnea, unspecified type  Patient's Home Medications on Admission: No current facility-administered medications for this visit. No current outpatient medications on file.  Facility-Administered Medications Ordered in Other Visits:  .  0.9 %  sodium chloride infusion, , Intravenous, Continuous, [name], MD, Stopped at 05/17/2019 1204 .  dextrose 5 %-0.45 % sodium chloride infusion, , Intravenous, Continuous, Wohl, Darren, MD, Last Rate: 75 mL/hr at 05/17/19 0756, New Bag at 05/17/19 0756 .  heparin ADULT infusion 100 units/mL (25000 units/276m sodium chloride 0.45%), 850 Units/hr, Intravenous, Continuous, Amery, Sahar, MD, Last Rate: 8.5 mL/hr at 05/19/2019 2049, 850 Units/hr at 04/21/2019 2049 .  menthol-cetylpyridinium (CEPACOL) lozenge 3 mg, 1 lozenge, Oral, PRN, WAllen Norris Darren, MD .  traZODone (DESYREL) tablet 50 mg, 50 mg, Oral, QHS PRN, OStark Klein EBing Neighbors NP  Past Medical History: Past Medical History:  Diagnosis Date  . Agent orange exposure   . Asthma    exercise induced asthma   . COPD (chronic obstructive pulmonary disease) ([facility]   . DJD (degenerative joint disease)    a. 02/2019 s/p L TKA.  .Marland KitchenGERD (gastroesophageal reflux disease)   . Hearing loss    wears hearing aids  . History of cardiac cath    a. History of 2 cardiac catheterizations, last ~ 10 yrs ago @ [facility]-->reportedly nl.  . History of stress test    a. 2020 - pharmacologic stress testing @ [facility]-->reportedly nl.  . Hyperlipidemia   . Hypertension   . Hypothyroidism   . Insomnia   . Lung cancer ([facility]    a. Dx 03/2019.   .Marland KitchenPericardial effusion    a. 03/2019 Echo: EF 60-65%, no rwma, nl RV fxn. Mildly dil LA. Mod pericardia eff w/o tamponade. Mild Ao sclerosis.  . Prostate cancer ([facility]   . PTSD (post-traumatic stress disorder)   . Seasonal allergies   . Thyroid disease     Tobacco Use: Social History   Tobacco Use  Smoking Status Former Smoker  . Packs/day: 2.00  . Years: 20.00  . Pack years: 40.00  . Types: Cigarettes  . Quit date: 04/20/1980  . Years since quitting: 39.0  Smokeless Tobacco Never Used    Labs: Recent Review FScientist, physiological   Labs for ITP Cardiac and Pulmonary Rehab Latest Ref Rng & Units 04/15/2019   TCO2 22 - 32 mmol/L 25       Pulmonary Assessment Scores: Pulmonary Assessment Scores    Row Name 12/07/18 1316         ADL UCSD   SOB Score total  62     Rest  1     Walk  2     Stairs  5     Bath  2     Dress  2     Shop  2       CAT Score   CAT Score  29       mMRC Score   mMRC Score  3        UCSD: Self-administered rating of dyspnea associated with activities of daily living (ADLs) 6-point  scale (0 = "not at all" to 5 = "maximal or unable to do because of breathlessness")  Scoring Scores range from 0 to 120.  Minimally important difference is 5 units  CAT: CAT can identify the health impairment of COPD patients and is better correlated with disease progression.  CAT has a scoring range of zero to 40. The CAT score is classified into four groups of low (less than 10), medium (10 - 20), high (21-30) and very high (31-40) based on the impact level of disease on health status. A CAT score over 10 suggests significant symptoms.  A worsening CAT score could be explained by an exacerbation, poor medication adherence, poor inhaler technique, or progression of COPD or comorbid conditions.  CAT MCID is 2 points  mMRC: mMRC (Modified Medical Research Council) Dyspnea Scale is used to assess the degree of baseline functional disability in patients of respiratory  disease due to dyspnea. No minimal important difference is established. A decrease in score of 1 point or greater is considered a positive change.   Pulmonary Function Assessment:   Exercise Target Goals: Exercise Program Goal: Individual exercise prescription set using results from initial 6 min walk test and THRR while considering  patient's activity barriers and safety.   Exercise Prescription Goal: Initial exercise prescription builds to 30-45 minutes a day of aerobic activity, 2-3 days per week.  Home exercise guidelines will be given to patient during program as part of exercise prescription that the participant will acknowledge.  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 HR  101 bpm     Max Ex. BP  124/58     2 Minute Post BP  112/58       Interval HR   1 Minute HR  92     2 Minute HR  70 patient holding hands behind back     3 Minute HR  -- sig loss     4 Minute HR  60 see previous note - not sure HR accurate     2 Minute Post HR  78     Interval Heart Rate?  Yes       Interval Oxygen   Interval Oxygen?  Yes     Baseline Oxygen Saturation %  96 %     1 Minute Oxygen Saturation %  96 %     1 Minute Liters of Oxygen  0 L  2 Minute Oxygen Saturation %  95 %     2 Minute Liters of  Oxygen  0 L     3 Minute Oxygen Saturation %  96 %     3 Minute Liters of Oxygen  0 L     4 Minute Oxygen Saturation %  95 %     4 Minute Liters of Oxygen  0 L     2 Minute Post Oxygen Saturation %  96 %     2 Minute Post Liters of Oxygen  0 L       Oxygen Initial Assessment: Oxygen Initial Assessment - 01/10/19 1320      Home Oxygen   Home Oxygen Device  --    Sleep Oxygen Prescription  --    Home Exercise Oxygen Prescription  --    Home at Rest Exercise Oxygen Prescription  --      Program Oxygen Prescription   Program Oxygen Prescription  --      Intervention   Short Term Goals  --    Long  Term Goals  --       Oxygen Re-Evaluation: Oxygen Re-Evaluation    Row Name 01/10/19 1345 02/02/19 1042 03/02/19 1052         Program Oxygen Prescription   Program Oxygen Prescription  None  None  None       Home Oxygen   Home Oxygen Device  None  None  None     Sleep Oxygen Prescription  None  None  None     Home Exercise Oxygen Prescription  None  None  None     Home at Rest Exercise Oxygen Prescription  None  None  None       Goals/Expected Outcomes   Short Term Goals  To learn and demonstrate proper use of respiratory medications;To learn and demonstrate proper pursed lip breathing techniques or other breathing techniques.;To learn and understand importance of maintaining oxygen saturations>88%;To learn and understand importance of monitoring SPO2 with pulse oximeter and demonstrate accurate use of the pulse oximeter.  To learn and demonstrate proper pursed lip breathing techniques or other breathing techniques.;To learn and understand importance of maintaining oxygen saturations>88%;To learn and understand importance of monitoring SPO2 with pulse oximeter and demonstrate accurate use of the pulse oximeter.  To learn and demonstrate proper pursed lip breathing techniques or other breathing techniques.;To learn and understand importance of maintaining oxygen saturations>88%;To  learn and understand importance of monitoring SPO2 with pulse oximeter and demonstrate accurate use of the pulse oximeter.     Long  Term Goals  Verbalizes importance of monitoring SPO2 with pulse oximeter and return demonstration;Maintenance of O2 saturations>88%;Exhibits proper breathing techniques, such as pursed lip breathing or other method taught during program session;Compliance with respiratory medication;Demonstrates proper use of MDI's  Maintenance of O2 saturations>88%;Exhibits proper breathing techniques, such as pursed lip breathing or other method taught during program session;Verbalizes importance of monitoring SPO2 with pulse oximeter and return demonstration  Maintenance of O2 saturations>88%;Exhibits proper breathing techniques, such as pursed lip breathing or other method taught during program session;Verbalizes importance of monitoring SPO2 with pulse oximeter and return demonstration     Comments  Patient states that his respiratory medications other than Albuterol are not helping him. Informed him to speak with his pulmonologist about possible going over his medications and seeing if he can get something to help with his breathing. He feels like nothing is working for his mantainence drugs.  He does not have  a pulse oximeter to check her oxygen saturation at home. Informed him where to get one and explained why it is important to have one. Practiced PLB with patient. Patient understands why PLB is important and to use it when he is short of breath. Reviewed that oxygen saturations should be 88 percent and above. He is meeting with his doctor soon to talk about his medications and his breathing.  Participant is practicing PLB during exercise. Encouraged to use outside of program as well. Having CT scan of lungs next Wednesday, trying to find reasons for shortness of breath and fatigue. Participant has not been able to purchase an oximeter for home. He reports it is always the same.      Goals/Expected Outcomes  Short: Speak with pulmonologist about medications. Long: take medications prescribed properly.  Short: use PLB with exertion. Long: use PLB on exertion proficiently and independently.  Short: Use PLB throughout the day at home. Long: Use PLB before becoming too short of breath and also to relax        Oxygen Discharge (Final Oxygen Re-Evaluation): Oxygen Re-Evaluation - 03/02/19 1052      Program Oxygen Prescription   Program Oxygen Prescription  None      Home Oxygen   Home Oxygen Device  None    Sleep Oxygen Prescription  None    Home Exercise Oxygen Prescription  None    Home at Rest Exercise Oxygen Prescription  None      Goals/Expected Outcomes   Short Term Goals  To learn and demonstrate proper pursed lip breathing techniques or other breathing techniques.;To learn and understand importance of maintaining oxygen saturations>88%;To learn and understand importance of monitoring SPO2 with pulse oximeter and demonstrate accurate use of the pulse oximeter.    Long  Term Goals  Maintenance of O2 saturations>88%;Exhibits proper breathing techniques, such as pursed lip breathing or other method taught during program session;Verbalizes importance of monitoring SPO2 with pulse oximeter and return demonstration    Comments  Participant is practicing PLB during exercise. Encouraged to use outside of program as well. Having CT scan of lungs next Wednesday, trying to find reasons for shortness of breath and fatigue. Participant has not been able to purchase an oximeter for home. He reports it is always the same.    Goals/Expected Outcomes  Short: Use PLB throughout the day at home. Long: Use PLB before becoming too short of breath and also to relax       Initial Exercise Prescription: Initial Exercise Prescription - 12/07/18 1300      Date of Initial Exercise RX and Referring Provider   Date  12/07/18    Referring Provider  [name]      Treadmill   MPH  1     Grade  0    Minutes  15   1 min then rest   METs  1.2      NuStep   Level  1    SPM  80    Minutes  15    METs  1.2      Arm Ergometer   Level  1    RPM  50    Minutes  15    METs  1.2      Biostep-RELP   Level  1    SPM  50    Minutes  15    METs  2      Prescription Details   Frequency (times per week)  2  Duration  Progress to 30 minutes of continuous aerobic without signs/symptoms of physical distress      Intensity   THRR 40-80% of Max Heartrate  104-130    Ratings of Perceived Exertion  11-13    Perceived Dyspnea  0-4      Progression   Progression  Continue to progress workloads to maintain intensity without signs/symptoms of physical distress.      Resistance Training   Training Prescription  Yes    Weight  3 lb    Reps  10-15       Perform Capillary Blood Glucose checks as needed.  Exercise Prescription Changes: Exercise Prescription Changes    Row Name 12/07/18 1300 12/20/18 1500 01/06/19 0700 01/17/19 1400 01/31/19 1100     Response to Exercise   Blood Pressure (Admit)  106/48  112/68  126/56  142/64  132/74   Blood Pressure (Exercise)  124/58  128/60  130/60  128/64  156/84   Blood Pressure (Exit)  112/58  94/68  110/54  120/64  110/58   Heart Rate (Admit)  101 bpm  95 bpm  75 bpm  64 bpm  75 bpm   Heart Rate (Exercise)  92 bpm  88 bpm  86 bpm  84 bpm  98 bpm   Heart Rate (Exit)  78 bpm  80 bpm  72 bpm  72 bpm  60 bpm   Oxygen Saturation (Admit)  96 %  97 %  94 %  94 %  92 %   Oxygen Saturation (Exercise)  95 %  93 %  94 %  94 %  94 %   Oxygen Saturation (Exit)  96 %  98 %  97 %  97 %  98 %   Rating of Perceived Exertion (Exercise)  '17  11  15  11  15   ' Perceived Dyspnea (Exercise)  '4  2  3  3  4   ' Symptoms  none  none  none  none  SOB   Duration  Progress to 30 minutes of  aerobic without signs/symptoms of physical distress  Progress to 30 minutes of  aerobic without signs/symptoms of physical distress  Continue with 30 min of aerobic  exercise without signs/symptoms of physical distress.  Continue with 30 min of aerobic exercise without signs/symptoms of physical distress.  Continue with 30 min of aerobic exercise without signs/symptoms of physical distress.   Intensity  --  --  --  THRR unchanged  THRR unchanged     Progression   Progression  Continue to progress workloads to maintain intensity without signs/symptoms of physical distress.  Continue to progress workloads to maintain intensity without signs/symptoms of physical distress.  Continue to progress workloads to maintain intensity without signs/symptoms of physical distress.  Continue to progress workloads to maintain intensity without signs/symptoms of physical distress.  Continue to progress workloads to maintain intensity without signs/symptoms of physical distress.   Average METs  --  --  --  2.5  2.85     Resistance Training   Training Prescription  --  Yes  Yes  Yes  Yes   Weight  --  3 lb  3 lb  3 lbs  3 lbs   Reps  --  10-15  10-15  10-15  10-15     Interval Training   Interval Training  --  --  --  No  No     NuStep   Level  --  4  3  3  2   SPM  --  80  80  --  --   Minutes  --  '15  15  30  30   ' METs  --  2  3.1  3.1  3.7     Biostep-RELP   Level  --  --  '1  1  1   ' SPM  --  --  50  --  --   Minutes  --  --  '30  30  30   ' METs  --  --  '2  2  2     ' Home Exercise Plan   Plans to continue exercise at  --  --  --  Home (comment) walking  Home (comment) walking   Frequency  --  --  --  Add 2 additional days to program exercise sessions.  Add 2 additional days to program exercise sessions.   Initial Home Exercises Provided  --  --  --  12/27/18  12/27/18   Row Name 02/14/19 1300 02/14/19 1400 03/01/19 1000         Response to Exercise   Blood Pressure (Admit)  132/58  --  122/60     Blood Pressure (Exercise)  142/60  --  134/54     Blood Pressure (Exit)  100/60  --  108/58     Heart Rate (Admit)  98 bpm  --  95 bpm     Heart Rate (Exercise)  98  bpm  --  96 bpm     Heart Rate (Exit)  101 bpm  --  90 bpm     Oxygen Saturation (Admit)  98 %  --  96 %     Oxygen Saturation (Exercise)  95 %  --  96 %     Oxygen Saturation (Exit)  98 %  --  97 %     Rating of Perceived Exertion (Exercise)  13  --  13     Perceived Dyspnea (Exercise)  4  --  3     Symptoms  --  --  SOB     Duration  Continue with 30 min of aerobic exercise without signs/symptoms of physical distress.  --  Continue with 30 min of aerobic exercise without signs/symptoms of physical distress.     Intensity  THRR unchanged  --  THRR unchanged       Progression   Progression  Continue to progress workloads to maintain intensity without signs/symptoms of physical distress.  --  Continue to progress workloads to maintain intensity without signs/symptoms of physical distress.     Average METs  2.7  --  2.35       Resistance Training   Training Prescription  Yes  --  Yes     Weight  3 lb  --  3 lbs     Reps  10-15  --  10-15       Interval Training   Interval Training  No  --  No       NuStep   Level  3  --  3     Minutes  30  --  15     METs  2.7  --  2.7       Biostep-RELP   Level  --  --  1     Minutes  --  --  15     METs  --  --  2       Home Exercise Plan  Plans to continue exercise at  --  Home (comment) walking  Home (comment) walking     Frequency  --  Add 2 additional days to program exercise sessions.  Add 2 additional days to program exercise sessions.     Initial Home Exercises Provided  --  12/27/18  12/27/18        Exercise Comments: Exercise Comments    Row Name 12/13/18 1302 12/27/18 1056         Exercise Comments  First full day of exercise!  Patient was oriented to gym and equipment including functions, settings, policies, and procedures.  Patient's individual exercise prescription and treatment plan were reviewed.  All starting workloads were established based on the results of the 6 minute walk test done at initial orientation visit.  The  plan for exercise progression was also introduced and progression will be customized based on patient's performance and goals.  Reviewed home exercise with pt today.  Pt plans to walk and look into [facility] for exercise.  Reviewed THR, pulse, RPE, sign and symptoms, NTG use, and when to call 911 or MD.  Also discussed weather considerations and indoor options.  Pt voiced understanding.  Buds shoulder has been sore since last session so he skipped the Arm crank today.         Exercise Goals and Review: Exercise Goals    Row Name 12/07/18 1309             Exercise Goals   Increase Physical Activity  Yes       Intervention  Provide advice, education, support and counseling about physical activity/exercise needs.;Develop an individualized exercise prescription for aerobic and resistive training based on initial evaluation findings, risk stratification, comorbidities and participant's personal goals.       Expected Outcomes  Short Term: Attend rehab on a regular basis to increase amount of physical activity.;Long Term: Add in home exercise to make exercise part of routine and to increase amount of physical activity.;Long Term: Exercising regularly at least 3-5 days a week.       Increase Strength and Stamina  Yes       Intervention  Provide advice, education, support and counseling about physical activity/exercise needs.;Develop an individualized exercise prescription for aerobic and resistive training based on initial evaluation findings, risk stratification, comorbidities and participant's personal goals.       Expected Outcomes  Short Term: Increase workloads from initial exercise prescription for resistance, speed, and METs.;Short Term: Perform resistance training exercises routinely during rehab and add in resistance training at home;Long Term: Improve cardiorespiratory fitness, muscular endurance and strength as measured by increased METs and functional capacity (6MWT)       Able to understand  and use rate of perceived exertion (RPE) scale  Yes       Intervention  Provide education and explanation on how to use RPE scale       Expected Outcomes  Short Term: Able to use RPE daily in rehab to express subjective intensity level;Long Term:  Able to use RPE to guide intensity level when exercising independently       Able to understand and use Dyspnea scale  Yes       Intervention  Provide education and explanation on how to use Dyspnea scale       Expected Outcomes  Short Term: Able to use Dyspnea scale daily in rehab to express subjective sense of shortness of breath during exertion;Long Term: Able to use Dyspnea scale to guide intensity  level when exercising independently       Knowledge and understanding of Target Heart Rate Range (THRR)  Yes       Intervention  Provide education and explanation of THRR including how the numbers were predicted and where they are located for reference       Expected Outcomes  Short Term: Able to state/look up THRR;Short Term: Able to use daily as guideline for intensity in rehab;Long Term: Able to use THRR to govern intensity when exercising independently       Able to check pulse independently  Yes       Intervention  Provide education and demonstration on how to check pulse in carotid and radial arteries.;Review the importance of being able to check your own pulse for safety during independent exercise       Expected Outcomes  Short Term: Able to explain why pulse checking is important during independent exercise;Long Term: Able to check pulse independently and accurately       Understanding of Exercise Prescription  Yes       Intervention  Provide education, explanation, and written materials on patient's individual exercise prescription       Expected Outcomes  Short Term: Able to explain program exercise prescription;Long Term: Able to explain home exercise prescription to exercise independently          Exercise Goals Re-Evaluation : Exercise Goals  Re-Evaluation    Row Name 12/13/18 1303 12/20/18 1545 12/27/18 1056 01/06/19 0736 01/17/19 1410     Exercise Goal Re-Evaluation   Exercise Goals Review  Increase Physical Activity;Increase Strength and Stamina;Able to understand and use rate of perceived exertion (RPE) scale;Able to understand and use Dyspnea scale;Knowledge and understanding of Target Heart Rate Range (THRR);Able to check pulse independently;Understanding of Exercise Prescription  Increase Physical Activity;Increase Strength and Stamina;Able to understand and use rate of perceived exertion (RPE) scale;Able to understand and use Dyspnea scale;Knowledge and understanding of Target Heart Rate Range (THRR);Able to check pulse independently;Understanding of Exercise Prescription  Increase Physical Activity;Increase Strength and Stamina;Able to understand and use rate of perceived exertion (RPE) scale;Knowledge and understanding of Target Heart Rate Range (THRR);Able to check pulse independently;Understanding of Exercise Prescription  Increase Physical Activity;Able to understand and use rate of perceived exertion (RPE) scale;Increase Strength and Stamina;Able to understand and use Dyspnea scale;Knowledge and understanding of Target Heart Rate Range (THRR);Able to check pulse independently;Understanding of Exercise Prescription  Increase Physical Activity;Increase Strength and Stamina;Understanding of Exercise Prescription   Comments  Reviewed RPE scale, THR and program prescription with pt today.  Pt voiced understanding and was given a copy of goals to take home.  Buds knee gives him a lot of trouble with exercise and he only uses one leg a lot of times.  he has progressed workloads and is reporting less SOB with exercise.  he started with working for 1-2 minutes and resting and is building up from there.  Reviewed home exercise with pt today.  Pt plans to walk and look into [facility] for exercise.  Reviewed THR, pulse, RPE, sign and symptoms, NTG  use, and when to call 911 or MD.  Also discussed weather considerations and indoor options.  Pt voiced understanding.  [name] attends consistently and works in correct RPE range.  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  This EP discussed a PT consult for his shoulder as he has noticed it hurts other times at home.  He can do 30  min on NS and Bio without rest.  [name] has been doing well in rehab.  He is up to level 3 on the NuStep!  We will continue to monitor his progress.   Expected Outcomes  Short: Use RPE daily to regulate intensity. Long: Follow program prescription in THR.  Short - complete 15 min without resting Long - complete 30 min without rest  --  Short - continue to attend consistently Long - increase overal MET level  Short: Increase workload on BioStep.  Long: Continue to improve stamina.   [place] Name 01/31/19 1132 02/02/19 1053 02/14/19 1401 03/01/19 0954 03/02/19 1033     Exercise Goal Re-Evaluation   Exercise Goals Review  Increase Physical Activity;Increase Strength and Stamina;Understanding of Exercise Prescription  Increase Strength and Stamina;Increase Physical Activity  Increase Physical Activity;Increase Strength and Stamina;Able to understand and use rate of perceived exertion (RPE) scale;Able to understand and use Dyspnea scale;Knowledge and understanding of Target Heart Rate Range (THRR);Able to check pulse independently;Understanding of Exercise Prescription  Increase Physical Activity;Increase Strength and Stamina;Understanding of Exercise Prescription  Increase Physical Activity;Increase Strength and Stamina;Understanding of Exercise Prescription   Comments  [name] continues to do well in rehab.  He should be ready to increase workloads again.  When last here, he was having a few bad breathing days.  We will continue to monitor his progress.  Patient states that he cannot do more exercise than what he is doing here. Informed him that we will talk about  home exercise after he talkes to his doctor about his medications.  [name] has stated he does better with the arm work by just doing ROM due to shoulder issues.  He is able to do 30 min on T4 without stopping  [name] continues to do okay in rehab. He is having issues with SOB which limits his abilty to progress.  We will continue to monitor his progress.  [name] is doing arm exercises at home on his days off. He is choosing to do arm exercises bc he is unstable on his feet. [name] lives alone. This EP encouraged that doing fly exercises and pulling exercises will help him to strengthen 'breathing' muscles and demonstrated some of those for him to use at home.Participant verbalized understanding.   Expected Outcomes  Short: Increase workloads.  Long: Continue to improve stamina.  Short: speak with physician about respiratory medications. Long: create a home exercise plan for patient.  Short - continue to attend consistently Long - increase overal stamina  Short: Move up BioStep.  Long: Continue to improve stamina.  Short: Add fly and pulling exercises to home routine. Long: Continue to add home exercise as strength improves as he can handle more.   [place] Name 03/29/19 1333 05/17/19 1227           Exercise Goal Re-Evaluation   Comments  Out since last review.  Out since last review.         Discharge Exercise Prescription (Final Exercise Prescription Changes): Exercise Prescription Changes - 03/01/19 1000      Response to Exercise   Blood Pressure (Admit)  122/60    Blood Pressure (Exercise)  134/54    Blood Pressure (Exit)  108/58    Heart Rate (Admit)  95 bpm    Heart Rate (Exercise)  96 bpm    Heart Rate (Exit)  90 bpm    Oxygen Saturation (Admit)  96 %    Oxygen Saturation (Exercise)  96 %    Oxygen Saturation (Exit)  97 %    Rating of Perceived Exertion (Exercise)  13    Perceived Dyspnea (Exercise)  3    Symptoms  SOB    Duration  Continue with 30 min of aerobic exercise without signs/symptoms of  physical distress.    Intensity  THRR unchanged      Progression   Progression  Continue to progress workloads to maintain intensity without signs/symptoms of physical distress.    Average METs  2.35      Resistance Training   Training Prescription  Yes    Weight  3 lbs    Reps  10-15      Interval Training   Interval Training  No      NuStep   Level  3    Minutes  15    METs  2.7      Biostep-RELP   Level  1    Minutes  15    METs  2      Home Exercise Plan   Plans to continue exercise at  Home (comment)   walking   Frequency  Add 2 additional days to program exercise sessions.    Initial Home Exercises Provided  12/27/18       Nutrition:  Target Goals: Understanding of nutrition guidelines, daily intake of sodium <1551m, cholesterol <208m calories 30% from fat and 7% or less from saturated fats, daily to have 5 or more servings of fruits and vegetables.  Biometrics: Pre Biometrics - 12/07/18 1310      Pre Biometrics   Height  5' 7.75" (1.721 m)    Weight  178 lb 12.8 oz (81.1 kg)    BMI (Calculated)  27.38        Nutrition Therapy Plan and Nutrition Goals: Nutrition Therapy & Goals - 12/07/18 1236      Nutrition Therapy   Diet  low Na, HH diet    Protein (specify units)  65-70g    Fiber  30 grams    Whole Grain Foods  3 servings    Saturated Fats  12 max. grams    Fruits and Vegetables  5 servings/day    Sodium  1.5 grams      Personal Nutrition Goals   Nutrition Goal  ST: add protein foods to B (when he has it) and protein to snack after lunch LT: lose 5 lbs and limit SOB    Comments  Pt reports eating breakfast sometimes (OJ, muffin, sometimes yogurt). Takes out [employer] lunch with veggies and salad and meat - sometimes potatoes. Pt will have small snack of fruit with whipped cream. Discussed maybe adding peanut butter or yogurt to add protein. Disucssed HH, low Na eating and higher needs. Pt reports having a balanced relationship with  food.      Intervention Plan   Intervention  Prescribe, educate and counsel regarding individualized specific dietary modifications aiming towards targeted core components such as weight, hypertension, lipid management, diabetes, heart failure and other comorbidities.;Nutrition handout(s) given to patient.    Expected Outcomes  Short Term Goal: Understand basic principles of dietary content, such as calories, fat, sodium, cholesterol and nutrients.;Short Term Goal: A plan has been developed with personal nutrition goals set during dietitian appointment.;Long Term Goal: Adherence to prescribed nutrition plan.       Nutrition Assessments:   Nutrition Goals Re-Evaluation: Nutrition Goals Re-Evaluation    [place] Name 01/10/19 1059 02/21/19 1139           Goals  Nutrition Goal  ST: add protein foods to B (when he has it) and protein to snack after lunch LT: lose 5 lbs and limit SOB  ST: add protein foods to B (when he has it) and protein to snack after lunch LT: lose 5 lbs and limit SOB      Comment  Pt reports feeling like walking is hard with pain and strength - mentioned how protein can help him build muslce so that he can increase his strength and mobility. Pt now consistently having breakfast " grain mix".  Continue with current changes      Expected Outcome  ST: add protein foods to B (when he has it) and protein to snack after lunch LT: lose 5 lbs and limit SOB  ST: add protein foods to B (when he has it) and protein to snack after lunch LT: lose 5 lbs and limit SOB         Nutrition Goals Discharge (Final Nutrition Goals Re-Evaluation): Nutrition Goals Re-Evaluation - 02/21/19 1139      Goals   Nutrition Goal  ST: add protein foods to B (when he has it) and protein to snack after lunch LT: lose 5 lbs and limit SOB    Comment  Continue with current changes    Expected Outcome  ST: add protein foods to B (when he has it) and protein to snack after lunch LT: lose 5 lbs and limit SOB        Psychosocial: Target Goals: Acknowledge presence or absence of significant depression and/or stress, maximize coping skills, provide positive support system. Participant is able to verbalize types and ability to use techniques and skills needed for reducing stress and depression.   Initial Review & Psychosocial Screening: Initial Psych Review & Screening - 12/06/18 1117      Initial Review   Current issues with  Current Stress Concerns;Current Anxiety/Panic    Source of Stress Concerns  Poor Coping Skills;Chronic Illness    Comments  PTSD on Zoloft, not currently seeing counselor, does not like being approached from behind, unexpected noises      [place]?  Yes    Comments  Church family, good close neighbors, son lives nearby      Barriers   Psychosocial barriers to participate in program  The patient should benefit from training in stress management and relaxation.;Psychosocial barriers identified (see note)      Screening Interventions   Interventions  Encouraged to exercise;Program counselor consult;To provide support and resources with identified psychosocial needs;Provide feedback about the scores to participant    Expected Outcomes  Short Term goal: Utilizing psychosocial counselor, staff and physician to assist with identification of specific Stressors or current issues interfering with healing process. Setting desired goal for each stressor or current issue identified.;Long Term Goal: Stressors or current issues are controlled or eliminated.;Short Term goal: Identification and review with participant of any Quality of Life or Depression concerns found by scoring the questionnaire.;Long Term goal: The participant improves quality of Life and PHQ9 Scores as seen by post scores and/or verbalization of changes       Quality of Life Scores:  Scores of 19 and below usually indicate a poorer quality of life in these areas.  A difference of  2-3 points is  a clinically meaningful difference.  A difference of 2-3 points in the total score of the Quality of Life Index has been associated with significant improvement in overall quality of life, self-image,  physical symptoms, and general health in studies assessing change in quality of life.  PHQ-9: Recent Review Flowsheet Data    Depression screen [facility] 2/9 01/31/2019 01/03/2019 12/07/2018   Decreased Interest 1 0 0   Down, Depressed, Hopeless 0 0 0   PHQ - 2 Score 1 0 0   Altered sleeping '3 2 3   ' Tired, decreased energy '1 1 2   ' Change in appetite - 0 1   Feeling bad or failure about yourself  0 0 0   Trouble concentrating 0 2 0   Moving slowly or fidgety/restless 0 0 3   Suicidal thoughts 3 0 0   PHQ-9 Score '8 5 9   ' Difficult doing work/chores Somewhat difficult Not difficult at all Somewhat difficult     Interpretation of Total Score  Total Score Depression Severity:  1-4 = Minimal depression, 5-9 = Mild depression, 10-14 = Moderate depression, 15-19 = Moderately severe depression, 20-27 = Severe depression   Psychosocial Evaluation and Intervention:   Psychosocial Re-Evaluation: Psychosocial Re-Evaluation    Row Name 01/03/19 1116 01/12/19 1040 02/02/19 1035 03/02/19 1101       Psychosocial Re-Evaluation   Current issues with  --  Current Stress Concerns;Current Anxiety/Panic  History of Depression;Current Stress Concerns;Current Anxiety/Panic  History of Depression;Current Stress Concerns;Current Anxiety/Panic    Comments  PHQ 9 repeated today  Score was 5  He states that his PTSD is under controll. Exercise is helping him somewhat to keep his stress down. He has a positive attitude in class and is willing to stick it out until the end.  Reviewed patient health questionnaire (PHQ-9) with patient for follow up. Previously, patients score indicated signs/symptoms of depression. Reviewed to see if patient is improving symptom wise while in program. Score declined and patient states that it  is because he has been more short of breath lately.  Being up is very stressful, feels like an invalid. [name] is being very proactive in his healthcare and continue asking the [place] for answers. Praised him for doing the right thing to find asnwers. Feels very bothered by the fact that he could do anything he wanted at the beginning of the year and now can't do anything. He has friends, pastor, etc. that he can reach out to if he needs help. He takes meds for PTSD and feels that he good about that medication at this time.    Expected Outcomes  --  Short:continue to attend [facility]. Long: maintain exercise to keep stress at a minimum.  Short: Continue to work toward an improvement in [place] scores by attending [facility] regularly. Long: Continue to improve stress and depression coping skills by talking with staff and attending [facility] regularly and work toward a positive mental state.  Short: Continue to work on getting health questions and concerns answered. Long: Continue to get out of the house and exercise as a stress relief and visit with friends virtually if possible.    Interventions  --  Encouraged to attend Pulmonary Rehabilitation for the exercise  Encouraged to attend Pulmonary Rehabilitation for the exercise  --    Continue Psychosocial Services   --  Follow up required by staff  Follow up required by staff  --       Psychosocial Discharge (Final Psychosocial Re-Evaluation): Psychosocial Re-Evaluation - 03/02/19 1101      Psychosocial Re-Evaluation   Current issues with  History of Depression;Current Stress Concerns;Current Anxiety/Panic    Comments  Being up is very stressful, feels  like an invalid. [name] is being very proactive in his healthcare and continue asking the [place] for answers. Praised him for doing the right thing to find asnwers. Feels very bothered by the fact that he could do anything he wanted at the beginning of the year and now can't do anything. He has friends, pastor, etc. that he  can reach out to if he needs help. He takes meds for PTSD and feels that he good about that medication at this time.    Expected Outcomes  Short: Continue to work on getting health questions and concerns answered. Long: Continue to get out of the house and exercise as a stress relief and visit with friends virtually if possible.       Education: Education Goals: Education classes will be provided on a weekly basis, covering required topics. Participant will state understanding/return demonstration of topics presented.  Learning Barriers/Preferences: Learning Barriers/Preferences - 12/06/18 1115      Learning Barriers/Preferences   Learning Barriers  Sight;Hearing;Exercise Concerns   wears glasses and hearing aid, wears knee brace   Learning Preferences  Individual Instruction;Written Material       Education Topics:  Initial Evaluation Education: - Verbal, written and demonstration of respiratory meds, oximetry and breathing techniques. Instruction on use of nebulizers and MDIs and importance of monitoring MDI activations.   General Nutrition Guidelines/Fats and Fiber: -Group instruction provided by verbal, written material, models and posters to present the general guidelines for heart healthy nutrition. Gives an explanation and review of dietary fats and fiber.   Controlling Sodium/Reading Food Labels: -Group verbal and written material supporting the discussion of sodium use in heart healthy nutrition. Review and explanation with models, verbal and written materials for utilization of the food label.   Exercise Physiology & General Exercise Guidelines: - Group verbal and written instruction with models to review the exercise physiology of the cardiovascular system and associated critical values. Provides general exercise guidelines with specific guidelines to those with heart or lung disease.    Aerobic Exercise & Resistance Training: - Gives group verbal and written  instruction on the various components of exercise. Focuses on aerobic and resistive training programs and the benefits of this training and how to safely progress through these programs.   Pulmonary Rehab from 02/23/2019 in [facility] Cardiac and Pulmonary Rehab  Date  02/23/19  Educator  jh  Instruction Review Code  1- Verbalizes Understanding      Flexibility, Balance, Mind/Body Relaxation: Provides group verbal/written instruction on the benefits of flexibility and balance training, including mind/body exercise modes such as yoga, pilates and tai chi.  Demonstration and skill practice provided.   Stress and Anxiety: - Provides group verbal and written instruction about the health risks of elevated stress and causes of high stress.  Discuss the correlation between heart/lung disease and anxiety and treatment options. Review healthy ways to manage with stress and anxiety.   Depression: - Provides group verbal and written instruction on the correlation between heart/lung disease and depressed mood, treatment options, and the stigmas associated with seeking treatment.   Exercise & Equipment Safety: - Individual verbal instruction and demonstration of equipment use and safety with use of the equipment.   Pulmonary Rehab from 12/07/2018 in [facility] Cardiac and Pulmonary Rehab  Date  12/07/18  Educator  AS  Instruction Review Code  1- Verbalizes Understanding      Infection Prevention: - Provides verbal and written material to individual with discussion of infection control including proper hand washing and  proper equipment cleaning during exercise session.   Pulmonary Rehab from 12/07/2018 in [facility] Cardiac and Pulmonary Rehab  Date  12/07/18  Educator  AS  Instruction Review Code  1- Verbalizes Understanding      Falls Prevention: - Provides verbal and written material to individual with discussion of falls prevention and safety.   Pulmonary Rehab from 12/07/2018 in [facility] Cardiac and Pulmonary  Rehab  Date  12/07/18  Educator  AS  Instruction Review Code  1- Verbalizes Understanding      Diabetes: - Individual verbal and written instruction to review signs/symptoms of diabetes, desired ranges of glucose level fasting, after meals and with exercise. Advice that pre and post exercise glucose checks will be done for 3 sessions at entry of program.   Chronic Lung Diseases: - Group verbal and written instruction to review updates, respiratory medications, advancements in procedures and treatments. Discuss use of supplemental oxygen including available portable oxygen systems, continuous and intermittent flow rates, concentrators, personal use and safety guidelines. Review proper use of inhaler and spacers. Provide informative websites for self-education.    Energy Conservation: - Provide group verbal and written instruction for methods to conserve energy, plan and organize activities. Instruct on pacing techniques, use of adaptive equipment and posture/positioning to relieve shortness of breath.   Triggers and Exacerbations: - Group verbal and written instruction to review types of environmental triggers and ways to prevent exacerbations. Discuss weather changes, air quality and the benefits of nasal washing. Review warning signs and symptoms to help prevent infections. Discuss techniques for effective airway clearance, coughing, and vibrations.   AED/CPR: - Group verbal and written instruction with the use of models to demonstrate the basic use of the AED with the basic ABC's of resuscitation.   Anatomy and Physiology of the Lungs: - Group verbal and written instruction with the use of models to provide basic lung anatomy and physiology related to function, structure and complications of lung disease.   Anatomy & Physiology of the Heart: - Group verbal and written instruction and models provide basic cardiac anatomy and physiology, with the coronary electrical and arterial  systems. Review of Valvular disease and Heart Failure   Cardiac Medications: - Group verbal and written instruction to review commonly prescribed medications for heart disease. Reviews the medication, class of the drug, and side effects.   Know Your Numbers and Risk Factors: -Group verbal and written instruction about important numbers in your health.  Discussion of what are risk factors and how they play a role in the disease process.  Review of Cholesterol, Blood Pressure, Diabetes, and BMI and the role they play in your overall health.   Pulmonary Rehab from 02/09/2019 in [facility] Cardiac and Pulmonary Rehab  Date  02/09/19  Educator  [facility] [place]  Instruction Review Code  1- Verbalizes Understanding      Sleep Hygiene: -Provides group verbal and written instruction about how sleep can affect your health.  Define sleep hygiene, discuss sleep cycles and impact of sleep habits. Review good sleep hygiene tips.    Other: -Provides group and verbal instruction on various topics (see comments)    Knowledge Questionnaire Score: Knowledge Questionnaire Score - 12/07/18 1322      Knowledge Questionnaire Score   Pre Score  14/18        Core Components/Risk Factors/Patient Goals at Admission: Personal Goals and Risk Factors at Admission - 12/07/18 1311      Core Components/Risk Factors/Patient Goals on Admission    Weight Management  Yes;Weight  Maintenance    Intervention  Weight Management: Develop a combined nutrition and exercise program designed to reach desired caloric intake, while maintaining appropriate intake of nutrient and fiber, sodium and fats, and appropriate energy expenditure required for the weight goal.;Weight Management: Provide education and appropriate resources to help participant work on and attain dietary goals.    Admit Weight  178 lb 12.8 oz (81.1 kg)    Goal Weight: Short Term  168 lb (76.2 kg)    Goal Weight: Long Term  158 lb (71.7 kg)    Expected Outcomes  Short  Term: Continue to assess and modify interventions until short term weight is achieved;Long Term: Adherence to nutrition and physical activity/exercise program aimed toward attainment of established weight goal    Intervention  Provide education on lifestyle modifcations including regular physical activity/exercise, weight management, moderate sodium restriction and increased consumption of fresh fruit, vegetables, and low fat dairy, alcohol moderation, and smoking cessation.;Monitor prescription use compliance.    Expected Outcomes  Short Term: Continued assessment and intervention until BP is < 140/50m HG in hypertensive participants. < 130/88mHG in hypertensive participants with diabetes, heart failure or chronic kidney disease.;Long Term: Maintenance of blood pressure at goal levels.    Intervention  Provide education and support for participant on nutrition & aerobic/resistive exercise along with prescribed medications to achieve LDL <7030mHDL >56m53m  Expected Outcomes  Short Term: Participant states understanding of desired cholesterol values and is compliant with medications prescribed. Participant is following exercise prescription and nutrition guidelines.;Long Term: Cholesterol controlled with medications as prescribed, with individualized exercise RX and with personalized nutrition plan. Value goals: LDL < 70mg64mL > 40 mg.       Core Components/Risk Factors/Patient Goals Review:  Goals and Risk Factor Review    Row Name 01/12/19 1043 02/02/19 1048 02/07/19 1036 03/02/19 1108       Core Components/Risk Factors/Patient Goals Review   Personal Goals Review  Weight Management/Obesity;Hypertension;Lipids  Weight Management/Obesity;Lipids;Improve shortness of breath with ADL's;Hypertension  Improve shortness of breath with ADL's;Weight Management/Obesity;Hypertension;Lipids  Improve shortness of breath with ADL's;Weight Management/Obesity;Hypertension;Lipids    Review  Patient wasnt to  lose a few pounds. He wants to lose around 10 pounds. His goal is to reach 170 pounds. He does not check his blood pressure at home and he has a machine. informed him to check his blood pressure at home when he wakes up. He does noit have a pulse oximeter and was informed to get one. He states he can get one through his insurance.  Patient states that he is getting more short of breath and is seeing his doctor to figure out if he can get respiratory medications. He has been having more trouble with his shortness of breath lately and has not been able to do the things that he wants to. He was going to go to church and could not shower or go due to him being short of breath.  [name] sees his pulmonologist tomorrow - he is taking meds as directed but still gets very short of breath.  he has done well with exercise and has seen imporvement there.  [name] is taking his medications as prescribed. He is happy with his current weight but does not want to gain any. [name] wants to ask Dr if he can take less meds for BP as he thinks that may contribute to some of his fatigue.    Expected Outcomes  Short: check blood pressure at home daily. Obtain  a pulse oximeter. Long: maintain blood pressure and oxygen reading at home independently.  Short: Attend [facility] regularly to improve shortness of breath with ADL's. Long: maintain independence with ADL's  Short - see Dr tomorrow  Long - improve shortness of breath with ADLs  Short: take meds as presscribed and go to Drs appts Long- Improve energy level and shortness of breath.       Core Components/Risk Factors/Patient Goals at Discharge (Final Review):  Goals and Risk Factor Review - 03/02/19 1108      Core Components/Risk Factors/Patient Goals Review   Personal Goals Review  Improve shortness of breath with ADL's;Weight Management/Obesity;Hypertension;Lipids    Review  [name] is taking his medications as prescribed. He is happy with his current weight but does not want to gain  any. [name] wants to ask Dr if he can take less meds for BP as he thinks that may contribute to some of his fatigue.    Expected Outcomes  Short: take meds as presscribed and go to Drs appts Long- Improve energy level and shortness of breath.       ITP Comments: ITP Comments    Row Name 12/06/18 1134 12/07/18 1259 12/28/18 0611 01/10/19 1316 01/25/19 1335   ITP Comments  Virtual Orientation completed.  Documentation can be found in [place] paperwork linked to encounter in media section.  Original referall was faxed on 11/16/18.  Completed initial 6MWT and nutrition eval.  Initial ITP created and sent to review to Dr Sabra Heck  30 Day review. Continue with ITP unless directed changes per Medical Director review.  Patient states that he was not sure about an event that happened at home. He states that he had a muscle soreness or burn in his chest. He states that it was not painful, or heartburn but something he realized.Informed him to make note if it happens again write down what he was doing before hand.  30 day review completed. ITP sent to Dr. Emily Filbert, Medical Director of Cardiac and Pulmonary Rehab. Continue with ITP unless changes are made by physician.  Department closed starting 10/2 until further notice by infection prevention and Health at Work teams for [place].   [place] Name 02/22/19 0634 03/02/19 1113 03/21/19 1332 03/22/19 1011 03/29/19 1332   ITP Comments  30 day review completed. Continue with ITP sent to Dr. Emily Filbert, Medical Director of Cardiac and Pulmonary Rehab for review , changes as needed and signature.  Patient having knee replacement next Friday and expects to be back as soon as possible after a quick recovery. Discussed patient needs of a shower chair, grab rails, someone to take and drive him home, to stay with him after surgery, to prepare meals, and help him with medications.  Called to check on pt.  Had TKR on 03/09/19 and sent SNF.  Left message.  30 day review competed . ITP sent to  Dr Emily Filbert for review, changes as needed and ITP approval signature.  [name] had a knee replacement surgery.  He continues to be out recieving HHPT.  His next follow up is in 4 weeks.   [place] Name 04/11/19 1442 04/19/19 0847 05/17/19 1226       ITP Comments  [name] was recently diagnosed with lung cancer.  He sees the oncologist this week and will call us with plan.  30 day review competed . ITP sent to Dr Emily Filbert for review, changes as needed and ITP approval signature  Currently in hospital  30 day review  completed. ITP sent to Dr. Emily Filbert, Medical Director of Cardiac and Pulmonary Rehab. Continue with ITP unless changes are made by physician.  Department operating under reduced schedule until further notice by request from hospital leadership. Pt out on medical hold, currently admitted for GI.        Comments: 30 day review

## 2019-05-17 NOTE — Progress Notes (Signed)
[place] for  Heparin  Indication: atrial fibrillation  Allergies  Allergen Reactions  . Bee Venom Anaphylaxis  . Feldene [Piroxicam] Hives    Patient Measurements: Height: 5\' 8"  (172.7 cm) Weight: 134 lb 0.6 oz (60.8 kg) IBW/kg (Calculated) : 68.4 Heparin Dosing Weight:  67 kg   Vital Signs: Temp: 97.4 F (36.3 C) (01/27 0011) Temp Source: Oral (01/27 0011) BP: 154/75 (01/27 0011) Pulse Rate: 64 (01/27 0245)  Labs: Recent Labs     0000 04/30/2019 1120 05/05/2019 1304 05/15/2019 1515 05/15/19 0004 05/15/19 1032 05/15/19 1704 05/08/2019 0425 05/07/2019 0425 04/29/2019 1841 05/17/19 0201 05/17/19 0818  HGB   < > 13.4  --   --   --  11.5*  --  11.1*  --   --  10.4*  --   HCT   < > 43.4  --   --   --  37.1*  --  36.1*  --   --  32.6*  --   PLT   < > 286  --   --   --  244  --  249  --   --  222  --   APTT  --   --   --  28   < > 79*   < > 38*   < > 55* 92* 87*  LABPROT  --   --   --  15.6*  --   --   --   --   --   --   --   --   INR  --   --   --  1.3*  --   --   --   --   --   --   --   --   HEPARINUNFRC  --   --   --   --    < > 1.32*  --  0.63  --   --  0.79*  --   CREATININE  --  1.36*  --   --   --   --   --  1.17  --   --   --   --   TROPONINIHS  --  240* 249*  --   --   --   --   --   --   --   --   --    < > = values in this interval not displayed.    Estimated Creatinine Clearance: 44.7 mL/min (by C-G formula based on SCr of 1.17 mg/dL).   Medical History: Past Medical History:  Diagnosis Date  . Agent orange exposure   . Asthma    exercise induced asthma   . COPD (chronic obstructive pulmonary disease) ([place])   . DJD (degenerative joint disease)    a. 02/2019 s/p L TKA.  Marland Kitchen GERD (gastroesophageal reflux disease)   . Hearing loss    wears hearing aids  . History of cardiac cath    a. History of 2 cardiac catheterizations, last ~ 10 yrs ago @ [facility]-->reportedly nl.  . History of stress test    a. 2020 - pharmacologic  stress testing @ [facility]-->reportedly nl.  . Hyperlipidemia   . Hypertension   . Hypothyroidism   . Insomnia   . Lung cancer ([place])    a. Dx 03/2019.  Marland Kitchen Pericardial effusion    a. 03/2019 Echo: EF 60-65%, no rwma, nl RV fxn. Mildly dil LA. Mod pericardia eff w/o tamponade. Mild Ao sclerosis.  . Prostate  cancer ([place])   . PTSD (post-traumatic stress disorder)   . Seasonal allergies   . Thyroid disease     Medications:  Facility-Administered Medications Prior to Admission  Medication Dose Route Frequency Provider Last Rate Last Admin  . Leuprolide Acetate (6 Month) (LUPRON) injection 45 mg  45 mg Intramuscular Once Stoioff, Scott C, MD       Medications Prior to Admission  Medication Sig Dispense Refill Last Dose  . amiodarone (PACERONE) 200 MG tablet Take 1 tablet (200 mg total) by mouth 2 (two) times daily. 60 tablet 0 Past Month at Unknown time  . apixaban (ELIQUIS) 5 MG TABS tablet Take 1 tablet (5 mg total) by mouth 2 (two) times daily. 60 tablet 0 Past Month at Unknown time  . cetirizine (ZYRTEC) 10 MG tablet Take 10 mg by mouth daily.    Past Month at Unknown time  . folic acid (FOLVITE) 1 MG tablet Take 1 mg by mouth daily.   Past Month at Unknown time  . folic acid (FOLVITE) 1 MG tablet Take 1 tablet (1 mg total) by mouth daily. Start 5-7 days before Alimta chemotherapy. Continue until 21 days after Alimta completed. 100 tablet 3 Past Month at Unknown time  . levothyroxine (SYNTHROID) 88 MCG tablet Take 88 mcg by mouth daily before breakfast.   Past Month at Unknown time  . megestrol (MEGACE) 40 MG tablet Take 1 tablet (40 mg total) by mouth daily. 30 tablet 1 Past Month at Unknown time  . ondansetron (ZOFRAN) 8 MG tablet Take 1 tablet (8 mg total) by mouth 2 (two) times daily as needed for refractory nausea / vomiting. 60 tablet 2 Past Month at Unknown time  . pravastatin (PRAVACHOL) 20 MG tablet Take 20 mg by mouth at bedtime.   Past Month at Unknown time  . prazosin (MINIPRESS) 5  MG capsule Take 10 mg by mouth at bedtime.    Past Month at Unknown time  . prochlorperazine (COMPAZINE) 10 MG tablet Take 1 tablet (10 mg total) by mouth every 6 (six) hours as needed (Nausea or vomiting). 60 tablet 2 Past Month at Unknown time  . REFRESH TEARS 0.5 % SOLN Place 1 drop into both eyes 2 (two) times daily.    Past Month at prn  . sertraline (ZOLOFT) 50 MG tablet Take 50 mg by mouth at bedtime.    Past Month at Unknown time  . tamsulosin (FLOMAX) 0.4 MG CAPS capsule Take 0.4 mg by mouth daily.    Past Month at Unknown time  . traZODone (DESYREL) 50 MG tablet Take 50 mg by mouth at bedtime.   Past Month at Unknown time  . alum & mag hydroxide-simeth (MAALOX/MYLANTA) 200-200-20 MG/5ML suspension Take 30 mLs by mouth every 6 (six) hours as needed for indigestion or heartburn. 355 mL 0 prn at prn  . lidocaine-prilocaine (EMLA) cream Apply to affected area once 30 g 3 prn at prn  . ondansetron (ZOFRAN ODT) 4 MG disintegrating tablet Take 1 tablet (4 mg total) by mouth every 8 (eight) hours as needed for nausea or vomiting. (Patient not taking: Reported on 04/29/2019) 20 tablet 0 Completed Course at Unknown time  . polyethylene glycol (MIRALAX / GLYCOLAX) 17 g packet Take 17 g by mouth daily. 14 each 0 prn at prn    Assessment: Pharmacy consulted to dose heparin for AFib in this 79 year old male who is NPO.  Was on Eliquis PTA. Per RN, last dose was ~ 4 days ago.  CrCl = 42.4 ml/min  1/26 0425 aPTT 38 seconds, subtherapeutic; was not correlating w/ HL; however, heparin drip was stopped for planned EGD today. Will need to f/u on further plans for anti-coagulation post-EGD.  1/26 1841 apTT 55. Heparin restarted at 750 at 1231. Will bolus 1000 units and increase rate to 850 units/hr.   01/27 @ 0200 aPTT 92 seconds, therapeutic.  Goal of Therapy:  APTT: 66-102  Heparin level 0.3-0.7 units/ml Monitor platelets by anticoagulation protocol: Yes   Plan:  01/27 @ 0818 aPTT 87 seconds,  therapeutic x 2.  Will continue current rate @ 850 units/hr. Will recheck aPTT and HL w/ am labs, CBC continues to trend down slightly will continue to monitor.   Pernell Dupre, PharmD, BCPS Clinical Pharmacist 05/17/2019 9:30 AM

## 2019-05-17 NOTE — Care Management Important Message (Signed)
Important Message  Patient Details  Name: Carlos Barrera MRN: [id number] Date of Birth: 06/15/1940   Medicare Important Message Given:  Yes     Dannette Barbara 05/17/2019, 10:48 AM

## 2019-05-18 ENCOUNTER — Inpatient Hospital Stay: Payer: Medicare Other

## 2019-05-18 DIAGNOSIS — E43 Unspecified severe protein-calorie malnutrition: Secondary | ICD-10-CM

## 2019-05-18 LAB — CBC
HCT: 32.7 % — ABNORMAL LOW (ref 39.0–52.0)
Hemoglobin: 10.6 g/dL — ABNORMAL LOW (ref 13.0–17.0)
MCH: 29.8 pg (ref 26.0–34.0)
MCHC: 32.4 g/dL (ref 30.0–36.0)
MCV: 91.9 fL (ref 80.0–100.0)
Platelets: 196 10*3/uL (ref 150–400)
RBC: 3.56 MIL/uL — ABNORMAL LOW (ref 4.22–5.81)
RDW: 15.9 % — ABNORMAL HIGH (ref 11.5–15.5)
WBC: 4.2 10*3/uL (ref 4.0–10.5)
nRBC: 0 % (ref 0.0–0.2)

## 2019-05-18 LAB — HEPARIN LEVEL (UNFRACTIONATED): Heparin Unfractionated: 0.63 IU/mL (ref 0.30–0.70)

## 2019-05-18 LAB — APTT: aPTT: 98 seconds — ABNORMAL HIGH (ref 24–36)

## 2019-05-18 NOTE — Progress Notes (Signed)
Spoke to this patient earlier this morning when he had the operator call me at home to ask me what he can eat.  I explained to the patient that he is going for a feeding tube and port placement tomorrow with interventional radiology and there is nothing more I can offer him.  I also told him that if he can tolerate clear liquids that that would be fine with me but I am unaware of other possible procedures or tests that may need to have him n.p.o. and I have asked him to speak to his primary hospitalist about IV fluids or feeding.  There is nothing more I can offer this gentleman since his esophagus was not patent enough to allow the passage of a PEG tube via endoscopy therefore he will go through IR.  I will sign off.  Please call if any further GI concerns or questions.  We would like to thank you for the opportunity to participate in the care of Carlos Barrera.

## 2019-05-18 NOTE — Progress Notes (Addendum)
Nutrition Follow-up  RD working remotely.  DOCUMENTATION CODES:   Severe malnutrition in context of chronic illness  INTERVENTION:  Noted plan is for G-tube placement by IR on 1/29.  Once G-tube appropriate for use per MD recommend advancing to goal TF regimen as follows: Day 1: Initiate Osmolite 1.5 Cal 1/2 can (~120 mL) bolus 5 times daily per tube + Pro-Stat 30 mL daily per tube. Day 2: Advance to goal regimen of Osmolite 1.5 Cal 1 can (237 mL) bolus 5 times daily per tube + Pro-Stat 30 mL daily per tube.Goal regimen provides 1875 kcal, 90 grams of  protein, 905 mL H2O daily.  Provide free water flush of 60 mL before and after each bolus tube feeding. Provide free water flush of 60 mL before and after Pro-Stat provision and water down Pro-Stat with 60 mL water before providing per tube. This provides a total of 1685 mL H2O daily.  If patient is discharging home with home health the protein modular may not be covered by insurance. If that is the case he can just receive Osmolite 1.5 Cal 1 can bolus 5 times daily per tube, which will meet 83% protein needs.   Patient is at risk for refeeding syndrome in setting of severe malnutrition and prolonged inadequate oral intake. Once tube feeds are initiated recommend monitoring potassium, phosphorus, and magnesium daily until patient is at goal TF regimen and electrolytes are stable.  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  Ongoing.  GOAL:   Patient will meet greater than or equal to 90% of their needs  Not met at this time.  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  Patient underwent EGD on 1/26 with dilation but site showed no change afterwards. Food was also seen in upper third of the esophagus. GI recommended G-tube placement by IR and this is planned for 1/29. Plan is also for patient to receive his port on the same day.  Medications reviewed and include: D5-1/2NS at 75 mL/hr (90 grams dextrose, 306 kcal), heparin gtt.  Labs reviewed.  Diet Order:   Diet Order            Diet clear liquid Room service appropriate? Yes; Fluid consistency: Thin  Diet effective now             EDUCATION NEEDS:   No education needs have been identified at this time  Skin:  Skin Assessment: Reviewed RN Assessment  Last BM:  05/11/2019  Height:   Ht Readings from Last 1 Encounters:  05/08/2019 '5\' 8"'  (1.727 m)   Weight:   Wt Readings from Last 1 Encounters:  04/30/2019 60.8 kg   Ideal Body Weight:  70 kg  BMI:  Body mass index is 20.38 kg/m.  Estimated Nutritional Needs:   Kcal:  5625-6389 (MSJ x 1.3-1.5)  Protein:  90-105 grams  Fluid:  1.7-2 L/day  Jacklynn Barnacle, MS, RD, LDN Office: [phone] Pager: [phone] After Hours/Weekend Pager: [phone]

## 2019-05-18 NOTE — Progress Notes (Addendum)
Patient fell attempting to get out of bed. MD notified at 2330. CT head ordered Family notified at 1215. Message left on Braelin Costlow son cell phone. VSS. No apparent injuries noted.

## 2019-05-18 NOTE — Progress Notes (Signed)
PROGRESS NOTE    Carlos Barrera  WCB:[id number] DOB: 06/23/1940 DOA: 05/10/2019  PCP: [facility]    LOS - 4   Brief Narrative:  Carlos Bacon Howlandis a 78 y.o.malewith a hx of 79 y.o.malewith a hx of atrial fibrillation on amiodarone and Eliquis, COPD, HTN, HLD, agent orange exposure, lung cancer, remote prostate cancer, hypothyroidism, remote tobacco abuse (40 pack years, quit 1982)presented with dysphagia to solids and liquids and  projectile vomiting.  Barium esophagram showed barium to be grossly retained at the midesophagus with tight, strictured appearing caliber of the esophagus at the level of the left mainstem bronchus.  Subsequently taken for EGD by gastroenterology.  Upper 1/3 of esophagus with retained food.  Dilation was attempted unsuccessfully.  Narrowing of esophagus is extrinsic  Plan is for PEG tube and port placement on Friday 1/29.  Subjective 1/28: Patient seen this AM.  Per report, patient apparently fell while trying to get out of bed.  Head CT was obtained and negative.  This morning, patient states he was only trying to separate himself using the trapeze of his bed, states he did not actually fall.  Reports he feels fine today.  Denies fevers or chills, chest pain or shortness of breath.  We discussed plan for port and PEG tube tomorrow.  Assessment & Plan:   Active Problems:   Adenocarcinoma of right lung (HCC)   Paroxysmal atrial fibrillation (HCC)   Intractable vomiting   Pericardial effusion   Hypertension   Dysphagia   Problems with swallowing and mastication   Stricture and stenosis of esophagus   Protein-calorie malnutrition, severe   Dysphagia secondary to esophageal stenosis See narrative about for hospital course to date.  Esophageal narrowing due to extrinsic cause, possibly compression by tumor/adenopathy (see PET scan report of 1/19). --PEG tube placement planned for Friday 1/29 with IR --stop heparin drip at 6 AM on  129 --dietician for tube feed regimen --GI signed off  Essential Hypertension - chronic, stable without antihypertensives  Paroxysmal A-fib - rate controlled --hold PO amiodarone (NPO due to dysphagia) --if becomes uncontrolled, will need amiodarone gtt (in stepdown/ICU) --Eliquis on hold for now --continue heparin gtt, stop at 6:00 AM Friday 1/29.  Stage IV adenocarcinoma of the lung, recently diagnosed, widespread bony mets --followed by Dr. Grayland Ormond, consulted --chemo port to be placed 1/29 --planning to start palliative chemo Feb 4th  Acute Kidney Injury - prerenal azotemia from decreased p.o. intake and projectile vomiting Improved.  Continue on IV fluids since n.p.o.  Avoid nephrotoxic drugs. --monitor BMP daily   DVT prophylaxis: on heparin gtt   Code Status: Full Code  Family Communication: none at bedside  Disposition Plan:  Expect medically ready for discharge after port and PEG placed on Friday 1/29.  Possible discharge 1/30, pending clearance by GI.   Consultants:   Gastroenterology  Oncology  Procedures:  EGD 1/26  Antimicrobials:   None     Objective: Vitals:   05/17/19 1700 05/17/19 2327 05/18/19 0347 05/18/19 0801  BP:  (!) 147/79 (!) 149/80 (!) 149/78  Pulse: 74 78 63 61  Resp: 16 (!) 24 (!) 22 16  Temp:   98 F (36.7 C)   TempSrc:   Axillary   SpO2: 90% 100% 98% 100%  Weight:      Height:        Intake/Output Summary (Last 24 hours) at 05/18/2019 1402 Last data filed at 05/18/2019 1216 Gross per 24 hour  Intake 720 ml  Output  1050 ml  Net -330 ml   Filed Weights   05/19/2019 1006 05/08/2019 0859  Weight: 67 kg 60.8 kg    Examination:  General exam: awake, alert, no acute distress, underweight Respiratory system: clear to auscultation bilaterally, no wheezes, rales or rhonchi, normal respiratory effort. Cardiovascular system: normal S1/S2, RRR, no JVD, murmurs, rubs, gallops, no pedal edema.   Gastrointestinal system:  soft, non-tender, non-distended  Central nervous system: alert and oriented x3. no gross focal neurologic deficits, normal speech Extremities: moves all, no edema, normal tone Skin: dry, intact, normal temperature Psychiatry: normal mood, congruent affect, judgement and insight appear normal    Data Reviewed: I have personally reviewed following labs and imaging studies  CBC: Recent Labs  Lab 05/13/2019 1120 05/15/19 1032 05/12/2019 0425 05/17/19 0201 05/18/19 0558  WBC 5.2 4.8 4.7 4.4 4.2  HGB 13.4 11.5* 11.1* 10.4* 10.6*  HCT 43.4 37.1* 36.1* 32.6* 32.7*  MCV 95.6 97.1 96.0 94.8 91.9  PLT 286 244 249 222 875   Basic Metabolic Panel: Recent Labs  Lab 04/22/2019 1120 04/26/2019 0425  NA 143 143  K 4.0 3.5  CL 106 107  CO2 27 26  GLUCOSE 97 91  BUN 21 18  CREATININE 1.36* 1.17  CALCIUM 10.8* 10.1   GFR: Estimated Creatinine Clearance: 44.7 mL/min (by C-G formula based on SCr of 1.17 mg/dL). Liver Function Tests: Recent Labs  Lab 05/19/2019 1120  AST 18  ALT 10  ALKPHOS 86  BILITOT 0.7  PROT 7.3  ALBUMIN 3.6   Recent Labs  Lab 04/24/2019 1120  LIPASE 29   No results for input(s): AMMONIA in the last 168 hours. Coagulation Profile: Recent Labs  Lab 04/22/2019 1515  INR 1.3*   Cardiac Enzymes: No results for input(s): CKTOTAL, CKMB, CKMBINDEX, TROPONINI in the last 168 hours. BNP (last 3 results) No results for input(s): PROBNP in the last 8760 hours. HbA1C: No results for input(s): HGBA1C in the last 72 hours. CBG: No results for input(s): GLUCAP in the last 168 hours. Lipid Profile: No results for input(s): CHOL, HDL, LDLCALC, TRIG, CHOLHDL, LDLDIRECT in the last 72 hours. Thyroid Function Tests: No results for input(s): TSH, T4TOTAL, FREET4, T3FREE, THYROIDAB in the last 72 hours. Anemia Panel: No results for input(s): VITAMINB12, FOLATE, FERRITIN, TIBC, IRON, RETICCTPCT in the last 72 hours. Sepsis Labs: No results for input(s): PROCALCITON,  LATICACIDVEN in the last 168 hours.  Recent Results (from the past 240 hour(s))  Respiratory Panel by RT PCR (Flu A&B, Covid) - Nasopharyngeal Swab     Status: None   Collection Time: 05/06/2019 11:20 AM   Specimen: Nasopharyngeal Swab  Result Value Ref Range Status   SARS Coronavirus 2 by RT PCR NEGATIVE NEGATIVE Final    Comment: (NOTE) SARS-CoV-2 target nucleic acids are NOT DETECTED. The SARS-CoV-2 RNA is generally detectable in upper respiratoy specimens during the acute phase of infection. The lowest concentration of SARS-CoV-2 viral copies this assay can detect is 131 copies/mL. A negative result does not preclude SARS-Cov-2 infection and should not be used as the sole basis for treatment or other patient management decisions. A negative result may occur with  improper specimen collection/handling, submission of specimen other than nasopharyngeal swab, presence of viral mutation(s) within the areas targeted by this assay, and inadequate number of viral copies (<131 copies/mL). A negative result must be combined with clinical observations, patient history, and epidemiological information. The expected result is Negative. Fact Sheet for Patients:  PinkCheek.be Fact Sheet for Healthcare Providers:  GravelBags.it This test is not yet ap proved or cleared by the [place] and  has been authorized for detection and/or diagnosis of SARS-CoV-2 by FDA under an Emergency Use Authorization (EUA). This EUA will remain  in effect (meaning this test can be used) for the duration of the COVID-19 declaration under Section 564(b)(1) of the Act, 21 U.S.C. section 360bbb-3(b)(1), unless the authorization is terminated or revoked sooner.    Influenza A by PCR NEGATIVE NEGATIVE Final   Influenza B by PCR NEGATIVE NEGATIVE Final    Comment: (NOTE) The Xpert Xpress SARS-CoV-2/FLU/RSV assay is intended as an aid in  the diagnosis of  influenza from Nasopharyngeal swab specimens and  should not be used as a sole basis for treatment. Nasal washings and  aspirates are unacceptable for Xpert Xpress SARS-CoV-2/FLU/RSV  testing. Fact Sheet for Patients: PinkCheek.be Fact Sheet for Healthcare Providers: GravelBags.it This test is not yet approved or cleared by the [place] FDA and  has been authorized for detection and/or diagnosis of SARS-CoV-2 by  FDA under an Emergency Use Authorization (EUA). This EUA will remain  in effect (meaning this test can be used) for the duration of the  Covid-19 declaration under Section 564(b)(1) of the Act, 21  U.S.C. section 360bbb-3(b)(1), unless the authorization is  terminated or revoked. Performed at [facility], [address] [place], [place] [address]          Radiology Studies: CT HEAD WO CONTRAST  Result Date: 05/18/2019 CLINICAL DATA:  Initial evaluation for acute trauma, fall. EXAM: CT HEAD WITHOUT CONTRAST TECHNIQUE: Contiguous axial images were obtained from the base of the skull through the vertex without intravenous contrast. COMPARISON:  Prior head CT from 10/22/2016. FINDINGS: Brain: Generalized age-related cerebral atrophy with mild chronic small vessel ischemic disease. No acute intracranial hemorrhage. No acute large vessel territory infarct. No mass lesion, midline shift or mass effect. No hydrocephalus. No extra-axial fluid collection. Vascular: No hyperdense vessel. Calcified atherosclerosis present at skull base. Skull: Scalp soft tissues within normal limits.  Calvarium intact. Sinuses/Orbits: Globes and orbital soft tissues demonstrate no acute finding. Paranasal sinuses and mastoid air cells are clear. Other: None. IMPRESSION: 1. No acute intracranial abnormality. 2. Mild age-related cerebral atrophy with chronic small vessel ischemic disease. Electronically Signed   By: Jeannine Boga  M.D.   On: 05/18/2019 00:47        Scheduled Meds: Continuous Infusions: . sodium chloride Stopped (05/10/2019 1204)  . dextrose 5 % and 0.45% NaCl 75 mL/hr at 05/18/19 1008  . heparin 850 Units/hr (05/17/19 1443)     LOS: 4 days    Time spent: 30 minutes    Ezekiel Slocumb, DO [facility]   If 7PM-7AM, please contact night-coverage [email or web address] 05/18/2019, 2:02 PM

## 2019-05-18 NOTE — Progress Notes (Signed)
[facility]  [facility]  Indication: atrial fibrillation  Allergies  Allergen Reactions  . Bee Venom Anaphylaxis  . Feldene [Piroxicam] Hives    Patient Measurements: Height: 5\' 8"  (172.7 cm) Weight: 134 lb 0.6 oz (60.8 kg) IBW/kg (Calculated) : 68.4 Heparin Dosing Weight:  67 kg   Vital Signs: Temp: 98 F (36.7 C) (01/28 0347) Temp Source: Axillary (01/28 0347) BP: 149/80 (01/28 0347) Pulse Rate: 63 (01/28 0347)  Labs: Recent Labs    04/25/2019 0425 05/02/2019 1841 05/17/19 0201 05/17/19 0818 05/18/19 0558  HGB 11.1*  --  10.4*  --  10.6*  HCT 36.1*  --  32.6*  --  32.7*  PLT 249  --  222  --  196  APTT 38*   < > 92* 87* 98*  HEPARINUNFRC 0.63  --  0.79*  --  0.63  CREATININE 1.17  --   --   --   --    < > = values in this interval not displayed.    Estimated Creatinine Clearance: 44.7 mL/min (by C-G formula based on SCr of 1.17 mg/dL).   Medical History: Past Medical History:  Diagnosis Date  . Agent orange exposure   . Asthma    exercise induced asthma   . COPD (chronic obstructive pulmonary disease) ([place])   . DJD (degenerative joint disease)    a. 02/2019 s/p L TKA.  Marland Kitchen GERD (gastroesophageal reflux disease)   . Hearing loss    wears hearing aids  . History of cardiac cath    a. History of 2 cardiac catheterizations, last ~ 10 yrs ago @ [facility]-->reportedly nl.  . History of stress test    a. 2020 - pharmacologic stress testing @ [facility]-->reportedly nl.  . Hyperlipidemia   . Hypertension   . Hypothyroidism   . Insomnia   . Lung cancer ([place])    a. Dx 03/2019.  Marland Kitchen Pericardial effusion    a. 03/2019 Echo: EF 60-65%, no rwma, nl RV fxn. Mildly dil LA. Mod pericardia eff w/o tamponade. Mild Ao sclerosis.  . Prostate cancer ([place])   . PTSD (post-traumatic stress disorder)   . Seasonal allergies   . Thyroid disease     Medications:  Facility-Administered Medications Prior to Admission  Medication Dose Route Frequency  Provider Last Rate Last Admin  . Leuprolide Acetate (6 Month) (LUPRON) injection 45 mg  45 mg Intramuscular Once Stoioff, Scott C, MD       Medications Prior to Admission  Medication Sig Dispense Refill Last Dose  . amiodarone (PACERONE) 200 MG tablet Take 1 tablet (200 mg total) by mouth 2 (two) times daily. 60 tablet 0 Past Month at Unknown time  . apixaban (ELIQUIS) 5 MG TABS tablet Take 1 tablet (5 mg total) by mouth 2 (two) times daily. 60 tablet 0 Past Month at Unknown time  . cetirizine (ZYRTEC) 10 MG tablet Take 10 mg by mouth daily.    Past Month at Unknown time  . folic acid (FOLVITE) 1 MG tablet Take 1 mg by mouth daily.   Past Month at Unknown time  . folic acid (FOLVITE) 1 MG tablet Take 1 tablet (1 mg total) by mouth daily. Start 5-7 days before Alimta chemotherapy. Continue until 21 days after Alimta completed. 100 tablet 3 Past Month at Unknown time  . levothyroxine (SYNTHROID) 88 MCG tablet Take 88 mcg by mouth daily before breakfast.   Past Month at Unknown time  . megestrol (MEGACE) 40 MG tablet  Take 1 tablet (40 mg total) by mouth daily. 30 tablet 1 Past Month at Unknown time  . ondansetron (ZOFRAN) 8 MG tablet Take 1 tablet (8 mg total) by mouth 2 (two) times daily as needed for refractory nausea / vomiting. 60 tablet 2 Past Month at Unknown time  . pravastatin (PRAVACHOL) 20 MG tablet Take 20 mg by mouth at bedtime.   Past Month at Unknown time  . prazosin (MINIPRESS) 5 MG capsule Take 10 mg by mouth at bedtime.    Past Month at Unknown time  . prochlorperazine (COMPAZINE) 10 MG tablet Take 1 tablet (10 mg total) by mouth every 6 (six) hours as needed (Nausea or vomiting). 60 tablet 2 Past Month at Unknown time  . REFRESH TEARS 0.5 % SOLN Place 1 drop into both eyes 2 (two) times daily.    Past Month at prn  . sertraline (ZOLOFT) 50 MG tablet Take 50 mg by mouth at bedtime.    Past Month at Unknown time  . tamsulosin (FLOMAX) 0.4 MG CAPS capsule Take 0.4 mg by mouth daily.     Past Month at Unknown time  . traZODone (DESYREL) 50 MG tablet Take 50 mg by mouth at bedtime.   Past Month at Unknown time  . alum & mag hydroxide-simeth (MAALOX/MYLANTA) 200-200-20 MG/5ML suspension Take 30 mLs by mouth every 6 (six) hours as needed for indigestion or heartburn. 355 mL 0 prn at prn  . lidocaine-prilocaine (EMLA) cream Apply to affected area once 30 g 3 prn at prn  . ondansetron (ZOFRAN ODT) 4 MG disintegrating tablet Take 1 tablet (4 mg total) by mouth every 8 (eight) hours as needed for nausea or vomiting. (Patient not taking: Reported on 05/02/2019) 20 tablet 0 Completed Course at Unknown time  . polyethylene glycol (MIRALAX / GLYCOLAX) 17 g packet Take 17 g by mouth daily. 14 each 0 prn at prn    Assessment: Pharmacy consulted to dose heparin for AFib in this 79 year old male who is NPO.  Was on Eliquis PTA. Per RN, last dose was ~ 4 days ago.  CrCl = 42.4 ml/min  1/26 0425 aPTT 38 seconds, subtherapeutic; was not correlating w/ HL; however, heparin drip was stopped for planned EGD today. Will need to f/u on further plans for anti-coagulation post-EGD.  1/26 1841 apTT 55. Heparin restarted at 750 at 1231. Will bolus 1000 units and increase rate to 850 units/hr.   01/27 @ 0200 aPTT 92 seconds, therapeutic.  Goal of Therapy:  APTT: 66-102  Heparin level 0.3-0.7 units/ml Monitor platelets by anticoagulation protocol: Yes   Plan:  01/28 @ 0600 HL 0.63, aPTT 98 seconds both therapeutic and correlating. Will continue current rate and will recheck HL w/ am labs and continue to monitor.  Tobie Lords, PharmD, BCPS Clinical Pharmacist 05/18/2019 7:05 AM

## 2019-05-18 NOTE — Plan of Care (Signed)

## 2019-05-19 ENCOUNTER — Ambulatory Visit
Admit: 2019-05-19 | Discharge: 2019-05-19 | Disposition: A | Discharge status: Home / Self Care | Payer: Medicare Other | Attending: Gastroenterology | Admitting: Gastroenterology

## 2019-05-19 ENCOUNTER — Other Ambulatory Visit: Payer: Medicare Other

## 2019-05-19 ENCOUNTER — Ambulatory Visit: Payer: Medicare Other

## 2019-05-19 DIAGNOSIS — C3491 Malignant neoplasm of unspecified part of right bronchus or lung: Secondary | ICD-10-CM

## 2019-05-19 HISTORY — PX: IR IMAGING GUIDED PORT INSERTION: IMG5740

## 2019-05-19 LAB — CBC
HCT: 35.5 % — ABNORMAL LOW (ref 39.0–52.0)
Hemoglobin: 11.8 g/dL — ABNORMAL LOW (ref 13.0–17.0)
MCH: 29.9 pg (ref 26.0–34.0)
MCHC: 33.2 g/dL (ref 30.0–36.0)
MCV: 90.1 fL (ref 80.0–100.0)
Platelets: 232 10*3/uL (ref 150–400)
RBC: 3.94 MIL/uL — ABNORMAL LOW (ref 4.22–5.81)
RDW: 15.7 % — ABNORMAL HIGH (ref 11.5–15.5)
WBC: 4.8 10*3/uL (ref 4.0–10.5)
nRBC: 0 % (ref 0.0–0.2)

## 2019-05-19 LAB — BASIC METABOLIC PANEL
Anion gap: 7 (ref 5–15)
BUN: 6 mg/dL — ABNORMAL LOW (ref 8–23)
CO2: 24 mmol/L (ref 22–32)
Calcium: 9.3 mg/dL (ref 8.9–10.3)
Chloride: 102 mmol/L (ref 98–111)
Creatinine, Ser: 0.83 mg/dL (ref 0.61–1.24)
GFR calc Af Amer: >60 mL/min (ref >60)
GFR calc non Af Amer: >60 mL/min (ref >60)
Glucose, Bld: 108 mg/dL — ABNORMAL HIGH (ref 70–99)
Potassium: 2.7 mmol/L — CL (ref 3.5–5.1)
Sodium: 133 mmol/L — ABNORMAL LOW (ref 135–145)

## 2019-05-19 LAB — HEPARIN LEVEL (UNFRACTIONATED): Heparin Unfractionated: 0.54 IU/mL (ref 0.30–0.70)

## 2019-05-19 LAB — PROTIME-INR
INR: 1 (ref 0.8–1.2)
Prothrombin Time: 13.4 seconds (ref 11.4–15.2)

## 2019-05-19 MED ORDER — POTASSIUM CHLORIDE 10 MEQ/100ML IV SOLN
10.0000 meq | INTRAVENOUS | Status: DC
  Administered 2019-05-19 (×4): 10 meq via INTRAVENOUS
  Filled 2019-05-19 (×4): qty 100

## 2019-05-19 MED ORDER — KETOROLAC TROMETHAMINE 15 MG/ML IJ SOLN
15.0000 mg | Freq: Three times a day (TID) | INTRAMUSCULAR | Status: DC | PRN
  Filled 2019-05-19: qty 1

## 2019-05-19 MED ORDER — CHLORHEXIDINE GLUCONATE CLOTH 2 % EX PADS
6.0000 | MEDICATED_PAD | Freq: Every day | CUTANEOUS | Status: DC
  Administered 2019-05-19 – 2019-05-21 (×3): 6 via TOPICAL

## 2019-05-19 MED ORDER — CEFAZOLIN SODIUM-DEXTROSE 2-4 GM/100ML-% IV SOLN
2.0000 g | INTRAVENOUS | Status: AC
  Administered 2019-05-20: 2 g via INTRAVENOUS
  Filled 2019-05-19: qty 100

## 2019-05-19 MED ORDER — FENTANYL CITRATE (PF) 100 MCG/2ML IJ SOLN
INTRAMUSCULAR | Status: AC | PRN
  Administered 2019-05-19 (×2): 50 ug via INTRAVENOUS

## 2019-05-19 MED ORDER — POTASSIUM CHLORIDE 10 MEQ/100ML IV SOLN
10.0000 meq | INTRAVENOUS | Status: AC
  Administered 2019-05-19 (×2): 10 meq via INTRAVENOUS
  Filled 2019-05-19 (×2): qty 100

## 2019-05-19 MED ORDER — HEPARIN (PORCINE) 25000 UT/250ML-% IV SOLN
850.0000 [IU]/h | INTRAVENOUS | Status: AC
  Administered 2019-05-19: 850 [IU]/h via INTRAVENOUS
  Filled 2019-05-19: qty 250

## 2019-05-19 MED ORDER — HEPARIN (PORCINE) 25000 UT/250ML-% IV SOLN: 850.0000 [IU]/h | INTRAVENOUS | Status: DC

## 2019-05-19 MED ORDER — MIDAZOLAM HCL 2 MG/2ML IJ SOLN
INTRAMUSCULAR | Status: AC | PRN
  Administered 2019-05-19 (×2): 1 mg via INTRAVENOUS

## 2019-05-19 MED ORDER — FENTANYL CITRATE (PF) 100 MCG/2ML IJ SOLN
INTRAMUSCULAR | Status: AC
  Filled 2019-05-19: qty 2

## 2019-05-19 MED ORDER — MIDAZOLAM HCL 2 MG/2ML IJ SOLN
INTRAMUSCULAR | Status: AC
  Filled 2019-05-19: qty 2

## 2019-05-19 MED ORDER — SODIUM CHLORIDE 0.9 % IV SOLN: INTRAVENOUS | Status: DC

## 2019-05-19 MED ORDER — KCL IN DEXTROSE-NACL 40-5-0.45 MEQ/L-%-% IV SOLN
INTRAVENOUS | Status: DC
  Filled 2019-05-19 (×5): qty 1000

## 2019-05-19 MED ORDER — CEFAZOLIN SODIUM-DEXTROSE 2-4 GM/100ML-% IV SOLN
2.0000 g | INTRAVENOUS | Status: AC
  Administered 2019-05-19: 2 g via INTRAVENOUS
  Filled 2019-05-19: qty 100

## 2019-05-19 NOTE — Progress Notes (Deleted)
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  He continues to have a poor appetite associated with increased weakness and fatigue, but otherwise feels well.  He has no neurologic complaints.  He denies any recent fevers.  He denies any chest pain, shortness of breath, cough, or hemoptysis.  He has no nausea, vomiting, constipation, or diarrhea.  He has no urinary complaints.  Patient offers no further specific complaints today.  REVIEW OF SYSTEMS:   Review of Systems  Constitutional: Positive for malaise/fatigue and weight loss. Negative for fever.  Respiratory: Negative.  Negative for cough and shortness of breath.   Cardiovascular: Negative.  Negative for chest pain and leg swelling.  Gastrointestinal: Negative.  Negative for abdominal pain.  Genitourinary: Negative.  Negative for dysuria.  Musculoskeletal: Negative.  Negative for back pain.  Skin: Negative.  Negative for rash.  Neurological: Positive for weakness. Negative for dizziness and headaches.  Psychiatric/Behavioral: Negative.  The patient is not nervous/anxious.     As per HPI. Otherwise, a complete review of systems is negative.  PAST MEDICAL HISTORY: Past Medical History:  Diagnosis Date  . Agent orange exposure   . Asthma    exercise induced asthma   . COPD (chronic obstructive pulmonary disease) ([place])   . DJD (degenerative joint disease)    a. 02/2019 s/p L TKA.  Marland Kitchen GERD (gastroesophageal reflux disease)   . Hearing loss    wears hearing aids  . History of cardiac cath    a. History of 2  cardiac catheterizations, last ~ 10 yrs ago @ [facility]-->reportedly nl.  . History of stress test    a. 2020 - pharmacologic stress testing @ [facility]-->reportedly nl.  . Hyperlipidemia   . Hypertension   . Hypothyroidism   . Insomnia   . Lung cancer ([place])    a. Dx 03/2019.  Marland Kitchen Pericardial effusion    a. 03/2019 Echo: EF 60-65%, no rwma, nl RV fxn. Mildly dil LA. Mod pericardia eff w/o tamponade. Mild Ao sclerosis.  . Prostate cancer ([place])   . PTSD (post-traumatic stress disorder)   . Seasonal allergies   . Thyroid disease     PAST SURGICAL HISTORY: Past Surgical History:  Procedure Laterality Date  . BACK SURGERY     x 2  . COLONOSCOPY     hx polyps  . ESOPHAGOGASTRODUODENOSCOPY (EGD) WITH PROPOFOL N/A 05/09/2019   Procedure: ESOPHAGOGASTRODUODENOSCOPY (EGD) WITH PROPOFOL;  Surgeon: [name], MD;  Location: [facility] ENDOSCOPY;  Service: Endoscopy;  Laterality: N/A;  . HERNIA REPAIR    . INCISE AND DRAIN ABCESS     patient denies this procedure 03/03/19  . NECK SURGERY     x 1  . PERICARDIOCENTESIS N/A 04/28/2019   Procedure: PERICARDIOCENTESIS;  Surgeon: Wellington Hampshire, MD;  Location: [place] CV LAB;  Service: Cardiovascular;  Laterality: N/A;  . TONSILLECTOMY    . 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 TOTAL KNEE ARTHROPLASTY Left 03/09/2019   Procedure: LEFT TOTAL KNEE ARTHROPLASTY;  Surgeon: Meredith Pel, MD;  Location: [place];  Service: Orthopedics;  Laterality: Left;  . WISDOM TOOTH EXTRACTION  FAMILY HISTORY: Family History  Problem Relation Age of Onset  . Breast cancer Mother        w/ brain mets -> died in her 28's.  Marland Kitchen Heart attack Father        died @ 31  . Other Brother        multiple medical issues, pt unaware of specifics.    ADVANCED DIRECTIVES (Y/N):  N  HEALTH MAINTENANCE: Social History   Tobacco Use  . Smoking status: Former Smoker    Packs/day: 2.00    Years: 20.00    Pack years: 40.00    Types: Cigarettes    Quit date:  04/20/1980    Years since quitting: 39.1  . Smokeless tobacco: Never Used  Substance Use Topics  . Alcohol use: No  . Drug use: No     Colonoscopy:  PAP:  Bone density:  Lipid panel:  Allergies  Allergen Reactions  . Bee Venom Anaphylaxis  . Feldene [Piroxicam] Hives    No current facility-administered medications for this visit.   No current outpatient medications on file.   Facility-Administered Medications Ordered in Other Visits  Medication Dose Route Frequency Provider Last Rate Last Admin  . 0.9 %  sodium chloride infusion   Intravenous Continuous [name], MD   Stopped at 04/22/2019 1204  . dextrose 5 %-0.45 % sodium chloride infusion   Intravenous Continuous [name], MD 75 mL/hr at 05/18/19 1008 New Bag at 05/18/19 1008  . LORazepam (ATIVAN) injection 1 mg  1 mg Intravenous QHS PRN Sharion Settler, NP   1 mg at 05/18/19 2215  . menthol-cetylpyridinium (CEPACOL) lozenge 3 mg  1 lozenge Oral PRN [name], MD      . traZODone (DESYREL) tablet 50 mg  50 mg Oral QHS PRN Lang Snow, NP        OBJECTIVE: There were no vitals filed for this visit.   There is no height or weight on file to calculate BMI.    ECOG FS:1 - Symptomatic but completely ambulatory  General: Thin, no acute distress. HEENT: Normocephalic. Neuro: Alert, answering all questions appropriately. Cranial nerves grossly intact. Psych: Normal affect.  LAB RESULTS:  Lab Results  Component Value Date   NA 143 05/21/2019   K 3.5 05/21/2019   CL 107 04/29/2019   CO2 26 05/15/2019   GLUCOSE 91 04/26/2019   BUN 18 05/01/2019   CREATININE 1.17 04/22/2019   CALCIUM 10.1 05/15/2019   PROT 7.3 05/11/2019   ALBUMIN 3.6 04/27/2019   AST 18 05/11/2019   ALT 10 04/25/2019   ALKPHOS 86 04/29/2019   BILITOT 0.7 04/29/2019   GFRNONAA 59 (L) 05/11/2019   GFRAA >60 04/26/2019    Lab Results  Component Value Date   WBC 4.8 05/19/2019   NEUTROABS 5.7 04/15/2019   HGB 11.8 (L)  05/19/2019   HCT 35.5 (L) 05/19/2019   MCV 90.1 05/19/2019   PLT 232 05/19/2019     STUDIES: DG Chest 2 View  Result Date: 04/27/2019 CLINICAL DATA:  Chest pressure shortness of breath for 1 week, recently diagnosed with lung cancer, getting PET scan tomorrow, former smoker, hypertension EXAM: CHEST - 2 VIEW COMPARISON:  04/17/2019 FINDINGS: Normal heart size, mediastinal contours, and pulmonary vascularity. Small RIGHT pleural effusion and basilar atelectasis. Remaining lungs clear. No LEFT pleural effusion, or evidence of pneumothorax. Bones demineralized with prior cervicothoracic fusion. IMPRESSION: Persistent small RIGHT pleural effusion and basilar atelectasis. Electronically Signed   By: Crist Infante.D.  On: 04/27/2019 17:19   CT HEAD WO CONTRAST  Result Date: 05/18/2019 CLINICAL DATA:  Initial evaluation for acute trauma, fall. EXAM: CT HEAD WITHOUT CONTRAST TECHNIQUE: Contiguous axial images were obtained from the base of the skull through the vertex without intravenous contrast. COMPARISON:  Prior head CT from 10/22/2016. FINDINGS: Brain: Generalized age-related cerebral atrophy with mild chronic small vessel ischemic disease. No acute intracranial hemorrhage. No acute large vessel territory infarct. No mass lesion, midline shift or mass effect. No hydrocephalus. No extra-axial fluid collection. Vascular: No hyperdense vessel. Calcified atherosclerosis present at skull base. Skull: Scalp soft tissues within normal limits.  Calvarium intact. Sinuses/Orbits: Globes and orbital soft tissues demonstrate no acute finding. Paranasal sinuses and mastoid air cells are clear. Other: None. IMPRESSION: 1. No acute intracranial abnormality. 2. Mild age-related cerebral atrophy with chronic small vessel ischemic disease. Electronically Signed   By: Jeannine Boga M.D.   On: 05/18/2019 00:47   CT abd  Result Date: 04/22/2019 CLINICAL DATA:  Emesis since yesterday. New onset of atrial  fibrillation. On blood thinners. Sensation of food stuck in esophagus. Lung and prostate cancer. EXAM: CT ABDOMEN AND PELVIS WITH CONTRAST TECHNIQUE: Multidetector CT imaging of the abdomen and pelvis was performed using the standard protocol following bolus administration of intravenous contrast. CONTRAST:  122mL OMNIPAQUE IOHEXOL 300 MG/ML  SOLN COMPARISON:  Plain film of the abdomen of earlier today. FINDINGS: Lower chest: Mild right base subsegmental atelectasis. Normal heart size with minimal pericardial fluid. Small right pleural effusion. Hepatobiliary: Normal liver. Normal gallbladder, without biliary ductal dilatation. Pancreas: Normal, without mass or ductal dilatation. Spleen: Normal in size, without focal abnormality. Adrenals/Urinary Tract: Normal left adrenal gland. Felt to arise exophytic off the right adrenal is a 2.1 x 1.5 cm nodule including on 29/2. When compared to the CT of the chest of 04/15/2019, this is low-density on that study, favoring an adenoma. Normal kidneys, without hydronephrosis.  Normal urinary bladder. Stomach/Bowel: Normal stomach, without wall thickening. Colonic stool burden suggests constipation. Normal terminal ileum and appendix. Normal small bowel. Vascular/Lymphatic: Aortic and branch vessel atherosclerosis. No abdominopelvic adenopathy. Reproductive: Normal prostate. Other: No significant free fluid. Small bilateral fat containing inguinal hernias. Musculoskeletal: Lumbar spondylosis. Prior interbody fusion material at L4-5. IMPRESSION: 1. No acute process in the abdomen or pelvis. 2. Possible constipation. 3. Small right pleural effusion. 4. Right adrenal adenoma. 5. Aortic Atherosclerosis (ICD10-I70.0). 6. Small pericardial effusion. Electronically Signed   By: Abigail Miyamoto M.D.   On: 04/22/2019 16:50   CARDIAC CATHETERIZATION  Result Date: 04/28/2019 And successful pericardiocentesis due to inability to reach the fluid from the subxiphoid area.  The amount of  effusion on echo did not appear large enough to be tapped from this approach. Recommendations: We will obtain a repeat limited echocardiogram.  If there is significant posterior and apical fluid, a pericardial window might be the best option.  NM PET Image Initial (PI) Skull Base To Thigh  Result Date: 05/09/2019 CLINICAL DATA:  Initial treatment strategy for lung cancer. EXAM: NUCLEAR MEDICINE PET SKULL BASE TO THIGH TECHNIQUE: 7.86 mCi F-18 FDG was injected intravenously. Full-ring PET imaging was performed from the skull base to thigh after the radiotracer. CT data was obtained and used for attenuation correction and anatomic localization. Fasting blood glucose: 103 mg/dl COMPARISON:  CTs of 04/22/2019 and 04/15/2019 FINDINGS: Mediastinal blood pool activity: SUV max 3.5 Liver activity: SUV max 4.3 NECK: Right level V B lymph node with increased FDG uptake (SUVmax = 4.4) this measures  6 mm. Signs of skeletal metastases in the cervical spine and thoracic inlet adenopathy, see below. Incidental CT findings: none CHEST: Diffuse hypermetabolic nodal enlargement throughout the chest involving supraclavicular nodes, superior mediastinal nodes, subcarinal nodes and right hilar nodal tissue. Small lymph nodes along the aorta in the left chest also show hypermetabolic activity. (Image 81, series 4) approximately 4.2 x 3.8 cm subcarinal nodal mass nearly contiguous with adjacent mediastinal nodal tissue (SUVmax = 25.4 similar FDG uptake within AP window lymph nodes. Soft tissue inseparable from adjacent bronchial structures. (image 67, series 4) 1.5 cm, short axis right paratracheal lymph node with intense metabolic activity (SUVmax = 20.1) ( (Image 60, series 4) high left superior mediastinal lymph node measuring 1.2 cm (SUVmax = 12.6) Incidental CT findings: Extensive soft tissue encasing central bronchial structures and displacing the esophagus. The esophagus above the mass is patulous suggesting mass effect or  direct invasion or obstruction of the esophagus due to mediastinal tumor. Calcified coronary artery disease. Generalized aortic atherosclerosis. Small right-sided effusion larger than on the study of 04/15/2019. Signs of interstitial thickening at the right lung base. Left chest is clear. ABDOMEN/PELVIS: Intense hypermetabolic activity associated with right adrenal lesion. (Image 119, series 4) 2.3 x 1.5 cm (SUVmax = 17.7) no definite sign of hepatic, splenic, pancreatic or left adrenal activity. Handling of FDG by the kidneys is symmetric. Small right retroperitoneal lymph node (image 131, series 4) (SUVmax = 5.0) Incidental CT findings: Aortic atherosclerosis. No signs of aneurysm. SKELETON: Multifocal osseous metastases. Signs of spinal fusion in the cervical spine. Hypermetabolic lesion in the spinous process of C5. Also involvement of the lateral mass of C2 along with the lamina and spinous process. Left T3 lamina extending in the spinous process with metastatic focus. Left parasternal metastasis in the sternal manubrium with 2 metastatic foci in the sternum. Lesions involving T7 vertebral body on the right, transverse process of T8 on the left, T12 vertebral body along the posterior cortex, L1 vertebral body along the right lateral aspect, L3 along the left lateral margin of the vertebral body, L4 in the transverse process, pedicle and spinous process, L5 in the transverse process and right lateral vertebral body at L5-S1. Lesion in the right posterior iliac bone, left iliac crest and right proximal femur as well. These lesions all showing a similar degree of FDG uptake. For example, the lesion at the T12 vertebral body (image 110, series 4 (SUVmax = 24.5) this lesion is lytic with vertebral destruction measuring 1.7 cm. Lesion the right L4 transverse process and pedicle (SUVmax = 16.1) Incidental CT findings: Spinal degenerative changes with evidence of spinal fusion. No signs of visible pathologic fracture.  IMPRESSION: 1. 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 Electronically Signed   By: Zetta Bills M.D.   On: 05/09/2019 13:42   DG Chest Portable 1 View  Result Date: 05/03/2019 CLINICAL DATA:  Nausea, vomiting and weakness for 2 weeks. History of lung carcinoma. EXAM: PORTABLE CHEST 1 VIEW COMPARISON:  PA and lateral chest 04/27/2019.  CT chest 04/15/2019. FINDINGS: The lungs are emphysematous but clear. There is some volume loss in the right chest. No pneumothorax or pleural effusion. Heart size is normal. Fullness of the right hilum is consistent with lymphadenopathy seen on prior CT. IMPRESSION: No acute disease. Emphysema. Right hilar fullness consistent with lymphadenopathy seen on prior CT. Electronically Signed   By: Inge Rise M.D.   On: 05/06/2019 10:56   DG Abd  2 Views  Result Date: 04/22/2019 CLINICAL DATA:  Abdominal pain EXAM: ABDOMEN - 2 VIEW COMPARISON:  None. FINDINGS: The bowel gas pattern is normal. Moderate volume of stool throughout the colon. There is no evidence of free air. No radio-opaque calculi or other significant radiographic abnormality is seen. IMPRESSION: Nonobstructive bowel gas pattern with moderate volume of stool throughout the colon. Electronically Signed   By: Davina Poke D.O.   On: 04/22/2019 13:30   ECHOCARDIOGRAM LIMITED  Result Date: 05/10/2019   ECHOCARDIOGRAM LIMITED REPORT   Patient Name:   JETSON PICKREL Date of Exam: 05/10/2019 Medical Rec #:  [id number]       Height:       68.0 in Accession #:    [id number]      Weight:       158.0 lb Date of Birth:  November 19, 1940       BSA:          1.85 m Patient Age:    79 years        BP:           112/76 mmHg Patient Gender: M               HR:           74 bpm. Exam Location:  [facility]  Procedure: Limited Echo, Color Doppler and Cardiac Doppler Indications:     Pericardial Effusion 423.9  History:         Patient has prior history of Echocardiogram  examinations.                  Pericardial Effusion.  Sonographer:     Sherrie Sport RDCS (AE) Referring Phys:  [place] Diagnosing Phys: Kate Sable MD IMPRESSIONS  1. Left ventricular ejection fraction, by visual estimation, is 55 to 60%. The left ventricle has normal function.  2. Left ventricular diastolic parameters are consistent with Grade II diastolic dysfunction (pseudonormalization).  3. The left ventricle has no regional wall motion abnormalities.  4. Global right ventricle has normal systolc function.The right ventricular size is normal.  5. Moderate pericardial effusion.  6. The pericardial effusion is anterior to the right ventricle, localized near the right ventricle and surrounding the apex.  7. Mild mitral annular calcification.  8. The mitral valve is degenerative.  9. The tricuspid valve was grossly normal. 10. Mild aortic valve sclerosis without stenosis. 11. The inferior vena cava is normal in size with greater than 50% respiratory variability, suggesting right atrial pressure of 3 mmHg. FINDINGS  Left Ventricle: Left ventricular ejection fraction, by visual estimation, is 55 to 60%. The left ventricle has normal function. The left ventricle has no regional wall motion abnormalities. Left ventricular diastolic parameters are consistent with Grade  II diastolic dysfunction (pseudonormalization). Right Ventricle: The right ventricular size is normal. Global RV systolic function is has normal systolic function. Left Atrium: Left atrial size was normal in size. Right Atrium: Right atrial size was normal in size. Right atrial pressure is estimated at 3 mmHg. Pericardium: A moderately sized pericardial effusion is present is seen. A moderately sized pericardial effusion is present. The pericardial effusion is anterior to the right ventricle, localized near the right ventricle and surrounding the apex. Mitral Valve: The mitral valve is degenerative in appearance. Mild mitral  annular calcification. MV Area by PHT, 2.11 cm. MV PHT, 104.11 msec. Tricuspid Valve: The tricuspid valve is grossly normal. Aortic Valve: The aortic valve is tricuspid. Mild aortic valve  sclerosis is present, with no evidence of aortic valve stenosis. Aorta: The aortic root is normal in size and structure. Venous: The inferior vena cava is normal in size with greater than 50% respiratory variability, suggesting right atrial pressure of 3 mmHg.  LEFT VENTRICLE          Normals PLAX 2D LVIDd:         4.07 cm  3.6 cm   Diastology                 Normals LVIDs:         2.49 cm  1.7 cm   LV e' lateral:   2.50 cm/s 6.42 cm/s LV PW:         1.27 cm  1.4 cm   LV E/e' lateral: 20.4      15.4 LV IVS:        1.48 cm  1.3 cm   LV e' medial:    3.92 cm/s 6.96 cm/s LVOT diam:     2.10 cm  2.0 cm   LV E/e' medial:  13.0      6.96 LV SV:         51 ml    79 ml LV SV Index:   27.33    45 ml/m2 LVOT Area:     3.46 cm 3.14 cm2  RIGHT VENTRICLE RV S prime:     16.80 cm/s TAPSE (M-mode): 3.1 cm LEFT ATRIUM             Index       RIGHT ATRIUM           Index LA diam:        3.10 cm 1.68 cm/m  RA Area:     10.00 cm LA Vol (A2C):   27.2 ml 14.71 ml/m RA Volume:   17.70 ml  9.57 ml/m LA Vol (A4C):   43.7 ml 23.64 ml/m LA Biplane Vol: 36.8 ml 19.90 ml/m                                PULMONIC VALVE           Normals AORTA                 Normals PV Vmax:        0.63 m/s Ao Root diam: 3.70 cm 31 mm   PV Peak grad:   1.6 mmHg                               RVOT Peak grad: 2 mmHg  MITRAL VALVE               Normals MV Area (PHT): 2.11 cm             SHUNTS MV PHT:        104.11 msec 55 ms    Systemic Diam: 2.10 cm MV Decel Time: 359 msec    187 ms MV E velocity: 50.90 cm/s 103 cm/s MV A velocity: 83.40 cm/s 70.3 cm/s MV E/A ratio:  0.61       1.5  Kate Sable MD Electronically signed by Kate Sable MD Signature Date/Time: 05/10/2019/1:53:16 PMThe mitral valve is degenerative in appearance.    Final    ECHOCARDIOGRAM  LIMITED  Result Date: 04/28/2019   ECHOCARDIOGRAM LIMITED REPORT   Patient Name:   CHRISTERPHER CLOS  [place] Date of Exam: 04/28/2019 Medical Rec #:  [id number]       Height:       68.0 in Accession #:    [id number]      Weight:       158.0 lb Date of Birth:  11/12/1940       BSA:          1.85 m Patient Age:    87 years        BP:           106/84 mmHg Patient Gender: M               HR:           76 bpm. Exam Location:  [facility]  Procedure: Limited Echo, Color Doppler and Cardiac Doppler Indications:     Pericardial effusion 423.9  History:         Patient has prior history of Echocardiogram examinations, most                  recent 04/16/2019. Pericardial Effusion.  Sonographer:     Sherrie Sport RDCS (AE) Referring Phys:  [place] Diagnosing Phys: Ida Rogue MD IMPRESSIONS  1. Left ventricular ejection fraction, by visual estimation, is 60 to 65%. The left ventricle has normal function. There is no increased left ventricular wall thickness.  2. Global right ventricle has normal systolc function.The right ventricular size is normal. no increase in right ventricular wall thickness.  3. Moderate pericardial effusion, predominantly around the RV free wall, estimated 1.4 to 1.8 cm, Trace outside the LV free wall, no evidence of tamponade.  4. Tricuspid valve regurgitation is mild.  5. Normal pulmonary artery systolic pressure. FINDINGS  Left Ventricle: Left ventricular ejection fraction, by visual estimation, is 60 to 65%. The left ventricle has normal function. There is no increased left ventricular wall thickness. Normal left atrial pressure. Right Ventricle: The right ventricular size is normal. No increase in right ventricular wall thickness. Global RV systolic function is has normal systolic function. Left Atrium: Left atrial size was normal in size. Right Atrium: Right atrial size was normal in size. Right atrial pressure is estimated at 10 mmHg. Pericardium: A moderately sized pericardial effusion is present is  seen. A moderately sized pericardial effusion is present. Mitral Valve: The mitral valve is normal in structure. No evidence of mitral valve stenosis by observation. No evidence of mitral valve regurgitation. Tricuspid Valve: The tricuspid valve is normal in structure. Tricuspid valve regurgitation is mild. Aortic Valve: The aortic valve is normal in structure. Aortic valve regurgitation is not visualized. The aortic valve is structurally normal, with no evidence of sclerosis or stenosis. Pulmonic Valve: The pulmonic valve was normal in structure. Pulmonic valve regurgitation is not visualized by color flow Doppler. Pulmonic regurgitation is not visualized by color flow Doppler. Aorta: The aortic root, ascending aorta and aortic arch are all structurally normal, with no evidence of dilitation or obstruction. Venous: The inferior vena cava is normal in size with greater than 50% respiratory variability, suggesting right atrial pressure of 3 mmHg. Shunts: There is no evidence of a patent foramen ovale. No ventricular septal defect is seen or detected. There is no evidence of an atrial septal defect. No atrial level shunt detected by color flow Doppler.  LEFT VENTRICLE          Normals PLAX 2D LVIDd:         3.68 cm  3.6 cm LVIDs:  2.39 cm  1.7 cm LV PW:         1.34 cm  1.4 cm LV IVS:        0.77 cm  1.3 cm LVOT diam:     2.00 cm  2.0 cm LV SV:         37 ml    79 ml LV SV Index:   20.12    45 ml/m2 LVOT Area:     3.14 cm 3.14 cm2  RIGHT VENTRICLE RV Basal diam:  4.02 cm LEFT ATRIUM         Index LA diam:    3.40 cm 1.84 cm/m   AORTA                 Normals Ao Root diam: 3.30 cm 31 mm  SHUNTS Systemic Diam: 2.00 cm  Ida Rogue MD Electronically signed by Ida Rogue MD Signature Date/Time: 04/28/2019/3:20:35 PMThe mitral valve is normal in structure.    Final    CT OUTSIDE FILMS CHEST  Result Date: 05/02/2019 This examination belongs to an outside facility and is stored here for comparison purposes  only.  Contact the originating outside institution for any associated report or interpretation.  CT OUTSIDE FILMS CHEST  Result Date: 05/02/2019 This examination belongs to an outside facility and is stored here for comparison purposes only.  Contact the originating outside institution for any associated report or interpretation.  DG ESOPHAGUS W SINGLE CM (SOL OR THIN BA)  Result Date: 05/15/2019 CLINICAL DATA:  Dysphagia EXAM: ESOPHOGRAM/BARIUM SWALLOW TECHNIQUE: Single contrast examination was performed using  thick barium. FLUOROSCOPY TIME:  Fluoroscopy Time:  00:30 Number of Acquired Spot Images: 25 COMPARISON:  PET-CT, 05/09/2019 FINDINGS: Single contrast barium swallow examination was performed under fluoroscopy. Examination is generally limited by patient nausea and intolerance of barium. There is no evidence of penetration or aspiration on initial oropharyngeal phase of swallow. Initial swallows of barium are seen to be grossly retained at the midesophagus with tight, strictured appearing caliber of the esophagus at the level of the left mainstem bronchus. Examination was ended at this point due to identification of obstruction and patient inability to tolerate further oral ingestion. The stomach and proximal small bowel are not assessed. IMPRESSION: 1. Examination limited as detailed above. Initial swallows of barium are seen to be grossly retained at the midesophagus with tight, strictured appearing caliber of the esophagus at the level of the left mainstem bronchus. 2. No evidence of penetration or aspiration. Electronically Signed   By: Eddie Candle M.D.   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  We are attempting to get this report as well as the cell block.  PET scan results from May 09, 2019  reviewed independently and reported as above with widespread bony metastatic disease.  Patient has agreed to port placement and palliative chemotherapy using carboplatinum, pemetrexed, and Avastin every 3 weeks.  He may also require Neulasta, but will hold for cycle 1.  Patient will also receive Zometa for his bony metastasis on odd-numbered cycles.  Return to clinic in approximately 2 weeks for further evaluation and consideration of cycle 1.  2.  Atrial fibrillation/cardiac disease: Appreciate cardiology input.  Continue Eliquis and amiodarone as prescribed. 3.  Prostate cancer: Gleason's 4+5.  Appears to be localized disease.  Patient completed XRT at [facility].  He will continue to receive Lupron from the [place]. 4.  Poor appetite/weight loss: Patient was given a prescription for Megace today.   Patient expressed understanding and was in agreement with this plan. He also understands that He can call clinic at any time with any questions, concerns, or complaints.   Cancer Staging Adenocarcinoma of right lung [facility]) Staging form: Lung, AJCC 8th Edition - Clinical stage from 05/12/2019: Stage IVB (cT3, cN3, pM1c) - Signed by Lloyd Huger, MD on 05/12/2019   Lloyd Huger, MD   05/19/2019 6:54 AM

## 2019-05-19 NOTE — TOC Progression Note (Signed)
Transition of Care [facility]) - Progression Note    Patient Details  Name: Carlos Barrera MRN: [id number] Date of Birth: 1940-12-19  Transition of Care [facility]) CM/SW [place], RN Phone Number: 05/19/2019, 9:24 AM  Clinical Narrative:     Manuela Neptune and bed search completed, Patient to have a new PEG tube and Chemo Port placed today,        Expected Discharge Plan and Services                                                 Social Determinants of Health (SDOH) Interventions    Readmission Risk Interventions No flowsheet data found.

## 2019-05-19 NOTE — Procedures (Signed)
Interventional Radiology Procedure Note  Procedure: Single Lumen Power Port Placement; aborted gastrostomy tube placement attempt   Access:  Right IJ vein.  Findings: Catheter tip positioned at SVC/RA junction. Port is ready for immediate use.   After port placement, 5 Fr catheter advanced through mouth and into esophagus under fluoroscopy. There is retained oral contrast and debris in the mid esophagus with high grade obstruction present. Could not advance guidewire or 5 Fr catheter through/beyond obstruction in order to insufflate stomach for percutaneous gastrostomy tube placement.  Complications: None  EBL: < 10 mL  Recommendations:  - Ok to shower in 24 hours - Do not submerge for 7 days - Routine line care   Recommend General Surgery consultation for possible surgical gastrostomy vs jejunostomy.  Venetia Night. Kathlene Cote, M.D Pager:  [phone]

## 2019-05-19 NOTE — NC FL2 (Signed)
[place] LEVEL OF CARE SCREENING TOOL     IDENTIFICATION  Patient Name: Carlos Barrera Birthdate: Apr 17, 1941 Sex: male Admission Date (Current Location): 04/26/2019  [place] and [place] Number:  Engineering geologist and Address:  [facility], [address], [place], [place] [address]      Provider Number: [id number]  Attending Physician Name and Address:  Ezekiel Slocumb, DO  Relative Name and Phone Number:  Aundra Millet [phone]    Current Level of Care: Hospital Recommended Level of Care: [place] Prior Approval Number:    Date Approved/Denied:   PASRR Number: [id number]  Discharge Plan: SNF    Current Diagnoses: Patient Active Problem List   Diagnosis Date Noted  . Protein-calorie malnutrition, severe 05/17/2019  . Problems with swallowing and mastication   . Stricture and stenosis of esophagus   . COPD (chronic obstructive pulmonary disease) ([place])   . Dysphagia   . Pericardial effusion 04/27/2019  . Hypertension   . Hyperlipidemia   . Goals of care, counseling/discussion 04/25/2019  . Prostate cancer ([place]) 04/25/2019  . Intractable vomiting   . Nonspecific chest pain 04/22/2019  . Adenocarcinoma of right lung ([place]) 04/16/2019  . Paroxysmal atrial fibrillation ([place]) 04/16/2019  . Atrial fibrillation with RVR ([place]) 04/16/2019  . Hypotension 04/16/2019  . Arthritis of left knee 03/09/2019    Orientation RESPIRATION BLADDER Height & Weight     Self, Time, Place, Situation  Normal Continent Weight: 60.8 kg Height:  5\' 8"  (172.7 cm)  BEHAVIORAL SYMPTOMS/MOOD NEUROLOGICAL BOWEL NUTRITION STATUS      Continent Diet(clear liquid only, feeds thru Peg tube)  AMBULATORY STATUS COMMUNICATION OF NEEDS Skin   Extensive Assist Verbally Surgical wounds(New Peg tube and chemo port placed)                       Personal Care Assistance Level of Assistance  Dressing, Bathing Bathing Assistance: Limited  assistance   Dressing Assistance: Limited assistance     Functional Limitations Info             SPECIAL CARE FACTORS FREQUENCY  PT (By licensed PT)     PT Frequency: 5 times per week              Contractures Contractures Info: Not present    Additional Factors Info  Allergies   Allergies Info: Feldene           Current Medications (05/19/2019):  This is the current hospital active medication list Current Facility-Administered Medications  Medication Dose Route Frequency Provider Last Rate Last Admin  . 0.9 %  sodium chloride infusion   Intravenous Continuous [name], MD   Stopped at 04/28/2019 1204  . ceFAZolin (ANCEF) IVPB 2g/100 mL premix  2 g Intravenous to XRAY Monia Sabal, PA-C      . dextrose 5 % and 0.45 % NaCl with KCl 40 mEq/L infusion   Intravenous Continuous Nicole Kindred A, DO      . dextrose 5 %-0.45 % sodium chloride infusion   Intravenous Continuous [name], MD 75 mL/hr at 05/18/19 1008 New Bag at 05/18/19 1008  . LORazepam (ATIVAN) injection 1 mg  1 mg Intravenous QHS PRN Sharion Settler, NP   1 mg at 05/18/19 2215  . menthol-cetylpyridinium (CEPACOL) lozenge 3 mg  1 lozenge Oral PRN [name], MD      . potassium chloride 10 mEq in 100 mL IVPB  10 mEq Intravenous Q1  Hr x 6 Nicole Kindred A, DO      . traZODone (DESYREL) tablet 50 mg  50 mg Oral QHS PRN Lang Snow, NP         Discharge Medications: Please see discharge summary for a list of discharge medications.  Relevant Imaging Results:  Relevant Lab Results:   Additional Information SS# [id number]  Su Hilt, RN

## 2019-05-19 NOTE — Progress Notes (Signed)
CRITICAL VALUE ALERT  Critical Value:  Potassium 2.7  Date & Time Notied:  05/19/19 915 am  Provider Notified: Dr. Arbutus Ped  Orders Received/Actions taken: 6 runs IV potassium and maintenance fluids changed

## 2019-05-19 NOTE — Consult Note (Signed)
Subjective:   CC: esophageal stricture  HPI:  Carlos Barrera is a 79 y.o. male who was consulted by Arbutus Ped for evaluation of above.  Hx of stage IV adeno of lung, likely tumor effect on esophagus causing stricturing and dysphagia.  Consulted for open G-tube placement due to unsuccessful placement via IR and endoscopic route.   Past Medical History:  has a past medical history of Agent orange exposure, Asthma, COPD (chronic obstructive pulmonary disease) ([place]), DJD (degenerative joint disease), GERD (gastroesophageal reflux disease), Hearing loss, History of cardiac cath, History of stress test, Hyperlipidemia, Hypertension, Hypothyroidism, Insomnia, Lung cancer ([place]), Pericardial effusion, Prostate cancer ([place]), PTSD (post-traumatic stress disorder), Seasonal allergies, and Thyroid disease.  Past Surgical History:  has a past surgical history that includes Hernia repair; Neck surgery; Incise and drain abcess; Back surgery; Wisdom tooth extraction; Tonsillectomy; Colonoscopy; Total knee arthroplasty (Left, 03/09/2019); Total knee arthroplasty (Left, 03/09/2019); PERICARDIOCENTESIS (N/A, 04/28/2019); Esophagogastroduodenoscopy (egd) with propofol (N/A, 05/05/2019); and IR IMAGING GUIDED PORT INSERTION (05/19/2019).  Family History: family history includes Breast cancer in his mother; Heart attack in his father; Other in his brother.  Social History:  reports that he quit smoking about 39 years ago. His smoking use included cigarettes. He has a 40.00 pack-year smoking history. He has never used smokeless tobacco. He reports that he does not drink alcohol or use drugs.  Current Medications:  Facility-Administered Medications Prior to Admission  Medication Dose Route Frequency Provider Last Rate Last Admin  . Leuprolide Acetate (6 Month) (LUPRON) injection 45 mg  45 mg Intramuscular Once Stoioff, Ronda Fairly, MD       Medications Prior to Admission  Medication Sig Dispense Refill  . amiodarone (PACERONE)  200 MG tablet Take 1 tablet (200 mg total) by mouth 2 (two) times daily. 60 tablet 0  . apixaban (ELIQUIS) 5 MG TABS tablet Take 1 tablet (5 mg total) by mouth 2 (two) times daily. 60 tablet 0  . cetirizine (ZYRTEC) 10 MG tablet Take 10 mg by mouth daily.     . folic acid (FOLVITE) 1 MG tablet Take 1 mg by mouth daily.    . folic acid (FOLVITE) 1 MG tablet Take 1 tablet (1 mg total) by mouth daily. Start 5-7 days before Alimta chemotherapy. Continue until 21 days after Alimta completed. 100 tablet 3  . levothyroxine (SYNTHROID) 88 MCG tablet Take 88 mcg by mouth daily before breakfast.    . megestrol (MEGACE) 40 MG tablet Take 1 tablet (40 mg total) by mouth daily. 30 tablet 1  . ondansetron (ZOFRAN) 8 MG tablet Take 1 tablet (8 mg total) by mouth 2 (two) times daily as needed for refractory nausea / vomiting. 60 tablet 2  . pravastatin (PRAVACHOL) 20 MG tablet Take 20 mg by mouth at bedtime.    . prazosin (MINIPRESS) 5 MG capsule Take 10 mg by mouth at bedtime.     . prochlorperazine (COMPAZINE) 10 MG tablet Take 1 tablet (10 mg total) by mouth every 6 (six) hours as needed (Nausea or vomiting). 60 tablet 2  . REFRESH TEARS 0.5 % SOLN Place 1 drop into both eyes 2 (two) times daily.     . sertraline (ZOLOFT) 50 MG tablet Take 50 mg by mouth at bedtime.     . tamsulosin (FLOMAX) 0.4 MG CAPS capsule Take 0.4 mg by mouth daily.     . traZODone (DESYREL) 50 MG tablet Take 50 mg by mouth at bedtime.    Marland Kitchen alum & mag hydroxide-simeth (MAALOX/MYLANTA)  200-200-20 MG/5ML suspension Take 30 mLs by mouth every 6 (six) hours as needed for indigestion or heartburn. 355 mL 0  . lidocaine-prilocaine (EMLA) cream Apply to affected area once 30 g 3  . ondansetron (ZOFRAN ODT) 4 MG disintegrating tablet Take 1 tablet (4 mg total) by mouth every 8 (eight) hours as needed for nausea or vomiting. (Patient not taking: Reported on 05/19/2019) 20 tablet 0  . polyethylene glycol (MIRALAX / GLYCOLAX) 17 g packet Take 17 g by  mouth daily. 14 each 0    Allergies:  Allergies  Allergen Reactions  . Bee Venom Anaphylaxis  . Feldene [Piroxicam] Hives    ROS:  General: Denies weight loss, weight gain, fatigue, fevers, chills, and night sweats. Eyes: Denies blurry vision, double vision, eye pain, itchy eyes, and tearing. Ears: Denies hearing loss, earache, and ringing in ears. Nose: Denies sinus pain, congestion, infections, runny nose, and nosebleeds. Mouth/throat: Denies hoarseness, sore throat, bleeding gums, and difficulty swallowing. Heart: Denies chest pain, palpitations, racing heart, irregular heartbeat, leg pain or swelling, and decreased activity tolerance. Respiratory: Denies breathing difficulty, shortness of breath, wheezing, cough, and sputum. GI: Denies change in appetite, heartburn, constipation, diarrhea, and blood in stool. GU: Denies difficulty urinating, pain with urinating, urgency, frequency, blood in urine. Musculoskeletal: Denies joint stiffness, pain, swelling, muscle weakness. Skin: Denies rash, itching, mass, tumors, sores, and boils Neurologic: Denies headache, fainting, dizziness, seizures, numbness, and tingling. Psychiatric: Denies depression, anxiety, difficulty sleeping, and memory loss. Endocrine: Denies heat or cold intolerance, and increased thirst or urination. Blood/lymph: Denies easy bruising, easy bruising, and swollen glands     Objective:     BP (!) 146/95 (BP Location: Left Arm)   Pulse 75   Temp (!) 97.5 F (36.4 C) (Oral)   Resp 14   Ht 5\' 8"  (1.727 m)   Wt 60.8 kg   SpO2 100%   BMI 20.38 kg/m   Constitutional :  alert, cooperative, appears stated age and no distress  Lymphatics/Throat:  no asymmetry, masses, or scars  Respiratory:  clear to auscultation bilaterally  Cardiovascular:  regular rate and rhythm  Gastrointestinal: soft, non-tender; bowel sounds normal; no masses,  no organomegaly.  Musculoskeletal: Steady gait and movement  Skin: Cool and  moist, no surgical scars  Psychiatric: Normal affect, non-agitated, not confused       LABS:  CMP Latest Ref Rng & Units 05/19/2019 05/03/2019 05/10/2019  Glucose 70 - 99 mg/dL 108(H) 91 97  BUN 8 - 23 mg/dL 6(L) 18 21  Creatinine 0.61 - 1.24 mg/dL 0.83 1.17 1.36(H)  Sodium 135 - 145 mmol/L 133(L) 143 143  Potassium 3.5 - 5.1 mmol/L 2.7(LL) 3.5 4.0  Chloride 98 - 111 mmol/L 102 107 106  CO2 22 - 32 mmol/L 24 26 27   Calcium 8.9 - 10.3 mg/dL 9.3 10.1 10.8(H)  Total Protein 6.5 - 8.1 g/dL - - 7.3  Total Bilirubin 0.3 - 1.2 mg/dL - - 0.7  Alkaline Phos 38 - 126 U/L - - 86  AST 15 - 41 U/L - - 18  ALT 0 - 44 U/L - - 10   CBC Latest Ref Rng & Units 05/19/2019 05/18/2019 05/17/2019  WBC 4.0 - 10.5 K/uL 4.8 4.2 4.4  Hemoglobin 13.0 - 17.0 g/dL 11.8(L) 10.6(L) 10.4(L)  Hematocrit 39.0 - 52.0 % 35.5(L) 32.7(L) 32.6(L)  Platelets 150 - 400 K/uL 232 196 222    RADS: n/a  Assessment:    Hx of stage IV adeno of lung, likely tumor  effect on esophagus causing stricturing and dysphagia. Unable to have adequate PO intake. Hx of afib, currently on hep drip malnutrition   Plan:     1. Alternatives include continued observation and TPN.  Benefits include possible symptom relief, improved nutrition. Discussed the risk of surgery including gtube failure, chronic pain, bleeding, intra-abdominal leak, post-op infxn, poor cosmesis, poor/delayed wound healing, and possible re-operation to address said risks. The risks of general anesthetic, if used, includes MI, CVA, sudden death or even reaction to anesthetic medications also discussed.  Typical post-op recovery time of 3-5 days with possible activity restrictions were also discussed.  The patient verbalized understanding and all questions were answered to the patient's satisfaction.  Will proceed with open gtube placement. Increased risk due to hep drip use and cardiac complications from afib.  Recommend stopping at least 2hrs prior to surgery to  minimize bleeding risk.

## 2019-05-19 NOTE — Care Management Important Message (Signed)
Important Message  Patient Details  Name: Carlos Barrera MRN: [id number] Date of Birth: 12-02-1940   Medicare Important Message Given:  Yes     Juliann Pulse A Haevyn Ury 05/19/2019, 11:18 AM

## 2019-05-19 NOTE — Progress Notes (Signed)
[place] for  Heparin  Indication: atrial fibrillation  Allergies  Allergen Reactions  . Bee Venom Anaphylaxis  . Feldene [Piroxicam] Hives    Patient Measurements: Height: 5\' 8"  (172.7 cm) Weight: 134 lb 0.6 oz (60.8 kg) IBW/kg (Calculated) : 68.4 Heparin Dosing Weight:  67 kg   Vital Signs: Temp: 97.5 F (36.4 C) (01/29 1323) Temp Source: Oral (01/29 1323) BP: 146/95 (01/29 1323) Pulse Rate: 75 (01/29 1323)  Labs: Recent Labs    05/17/19 0201 05/17/19 0201 05/17/19 0818 05/18/19 0558 05/19/19 0401 05/19/19 0825  HGB 10.4*   < >  --  10.6* 11.8*  --   HCT 32.6*  --   --  32.7* 35.5*  --   PLT 222  --   --  196 232  --   APTT 92*  --  87* 98*  --   --   LABPROT  --   --   --   --   --  13.4  INR  --   --   --   --   --  1.0  HEPARINUNFRC 0.79*  --   --  0.63 0.54  --   CREATININE  --   --   --   --   --  0.83   < > = values in this interval not displayed.    Estimated Creatinine Clearance: 63.1 mL/min (by C-G formula based on SCr of 0.83 mg/dL).   Medical History: Past Medical History:  Diagnosis Date  . Agent orange exposure   . Asthma    exercise induced asthma   . COPD (chronic obstructive pulmonary disease) ([place])   . DJD (degenerative joint disease)    a. 02/2019 s/p L TKA.  Marland Kitchen GERD (gastroesophageal reflux disease)   . Hearing loss    wears hearing aids  . History of cardiac cath    a. History of 2 cardiac catheterizations, last ~ 10 yrs ago @ [facility]-->reportedly nl.  . History of stress test    a. 2020 - pharmacologic stress testing @ [facility]-->reportedly nl.  . Hyperlipidemia   . Hypertension   . Hypothyroidism   . Insomnia   . Lung cancer ([place])    a. Dx 03/2019.  Marland Kitchen Pericardial effusion    a. 03/2019 Echo: EF 60-65%, no rwma, nl RV fxn. Mildly dil LA. Mod pericardia eff w/o tamponade. Mild Ao sclerosis.  . Prostate cancer ([place])   . PTSD (post-traumatic stress disorder)   . Seasonal allergies   . Thyroid  disease     Medications:  Facility-Administered Medications Prior to Admission  Medication Dose Route Frequency Provider Last Rate Last Admin  . Leuprolide Acetate (6 Month) (LUPRON) injection 45 mg  45 mg Intramuscular Once Stoioff, Scott C, MD       Medications Prior to Admission  Medication Sig Dispense Refill Last Dose  . amiodarone (PACERONE) 200 MG tablet Take 1 tablet (200 mg total) by mouth 2 (two) times daily. 60 tablet 0 Past Month at Unknown time  . apixaban (ELIQUIS) 5 MG TABS tablet Take 1 tablet (5 mg total) by mouth 2 (two) times daily. 60 tablet 0 Past Month at Unknown time  . cetirizine (ZYRTEC) 10 MG tablet Take 10 mg by mouth daily.    Past Month at Unknown time  . folic acid (FOLVITE) 1 MG tablet Take 1 mg by mouth daily.   Past Month at Unknown time  . folic acid (FOLVITE) 1 MG tablet Take  1 tablet (1 mg total) by mouth daily. Start 5-7 days before Alimta chemotherapy. Continue until 21 days after Alimta completed. 100 tablet 3 Past Month at Unknown time  . levothyroxine (SYNTHROID) 88 MCG tablet Take 88 mcg by mouth daily before breakfast.   Past Month at Unknown time  . megestrol (MEGACE) 40 MG tablet Take 1 tablet (40 mg total) by mouth daily. 30 tablet 1 Past Month at Unknown time  . ondansetron (ZOFRAN) 8 MG tablet Take 1 tablet (8 mg total) by mouth 2 (two) times daily as needed for refractory nausea / vomiting. 60 tablet 2 Past Month at Unknown time  . pravastatin (PRAVACHOL) 20 MG tablet Take 20 mg by mouth at bedtime.   Past Month at Unknown time  . prazosin (MINIPRESS) 5 MG capsule Take 10 mg by mouth at bedtime.    Past Month at Unknown time  . prochlorperazine (COMPAZINE) 10 MG tablet Take 1 tablet (10 mg total) by mouth every 6 (six) hours as needed (Nausea or vomiting). 60 tablet 2 Past Month at Unknown time  . REFRESH TEARS 0.5 % SOLN Place 1 drop into both eyes 2 (two) times daily.    Past Month at prn  . sertraline (ZOLOFT) 50 MG tablet Take 50 mg by mouth  at bedtime.    Past Month at Unknown time  . tamsulosin (FLOMAX) 0.4 MG CAPS capsule Take 0.4 mg by mouth daily.    Past Month at Unknown time  . traZODone (DESYREL) 50 MG tablet Take 50 mg by mouth at bedtime.   Past Month at Unknown time  . alum & mag hydroxide-simeth (MAALOX/MYLANTA) 200-200-20 MG/5ML suspension Take 30 mLs by mouth every 6 (six) hours as needed for indigestion or heartburn. 355 mL 0 prn at prn  . lidocaine-prilocaine (EMLA) cream Apply to affected area once 30 g 3 prn at prn  . ondansetron (ZOFRAN ODT) 4 MG disintegrating tablet Take 1 tablet (4 mg total) by mouth every 8 (eight) hours as needed for nausea or vomiting. (Patient not taking: Reported on 05/15/2019) 20 tablet 0 Completed Course at Unknown time  . polyethylene glycol (MIRALAX / GLYCOLAX) 17 g packet Take 17 g by mouth daily. 14 each 0 prn at prn    Assessment: Pharmacy consulted to dose heparin for AFib in this 79 year old male who is NPO.  Was on Eliquis PTA.  Heparin infusion stopped 1/29 AM for Port and PEG placement. Port placement successful. PEG unable to be placed due to esophageal stenosis.   Prior to procedures patient's Anti-Xa (Heparin level) was therapeutic on heparin 850 units/hr.   Goal of Therapy:  APTT: 66-102  Heparin level 0.3-0.7 units/ml Monitor platelets by anticoagulation protocol: Yes   Plan:  1/29 After discussion with provider, Heparin infusion will be restarted 1/29 @ 2000 W/O bolus.  Heparin 850 units/hr ordered to start @ 2000 tonight.  Anti-Xa level (heparin level) ordered for 1/30 AM Continue to monitor CBCs daily per protocol while on heparin.   Pernell Dupre, PharmD, BCPS Clinical Pharmacist 05/19/2019 1:54 PM

## 2019-05-19 NOTE — Progress Notes (Addendum)
PROGRESS NOTE    Carlos Barrera  KKX:[id number] DOB: 11-03-1940 DOA: 05/08/2019  PCP: [facility], [facility]    LOS - 5   Brief Narrative:  Carlos Bacon Howlandis a 79 y.o.malewith a hx of 79 y.o.malewith a hx of atrial fibrillationon amiodarone and Eliquis, COPD, HTN, HLD, agent orange exposure, lung cancer, remote prostate cancer, hypothyroidism, remote tobacco abuse (40 pack years, quit 1982)presented with dysphagiato solids and liquids andprojectile vomiting. Barium esophagram showedbarium to be grossly retained at the midesophagus with tight, strictured appearing caliber of the esophagus at the level of the left mainstem bronchus.Subsequently taken for EGD by gastroenterology. Upper 1/3 of esophagus with retained food. Dilation was attempted unsuccessfully. Narrowing of esophagus is extrinsic Plan is for PEG tube and port placement on Friday 1/29.   Subjective 1/29: Patient seen after procedures today.  PEG was not able to be placed.  Port was successfully placed.  He reports severe headache.  Patient drowsy still.  Denies pain, SOB, N/V/D or other complaints.  No acute events reported.   Assessment & Plan:   Active Problems:   Adenocarcinoma of right lung (HCC)   Paroxysmal atrial fibrillation (HCC)   Intractable vomiting   Pericardial effusion   Hypertension   Dysphagia   Problems with swallowing and mastication   Stricture and stenosis of esophagus   Protein-calorie malnutrition, severe   Dysphagiasecondary to esophageal stenosis See narrative about for hospital course to date. Esophageal narrowing due to extrinsic cause, most likely compression by tumor/adenopathy (see PET scan report of 1/19). --PEG tube was not able to be placed by IR due to esophageal stenosis and retained materials --general surgery consulted for feeding tube placement --may need to start TPN if unable to get feeding tube soon --dietician for tube feed regimen --holding all PO  meds --GI signed off  Essential Hypertension- chronic, stable without antihypertensives  Paroxysmal A-fib- rate controlled --hold POamiodarone(NPOdue to dysphagia) --if becomes uncontrolled, will need amiodarone gtt (in stepdown/ICU) --Eliquis on hold  --resume heparin gtt after procedures, timing per IR  Stage IV adenocarcinoma of the lung, recently diagnosed, widespread bony mets --followed by Carlos Barrera, consulted --chemo port placed by IR on 1/29 --planning to start palliative chemo Feb 4th  Acute Kidney Injury- prerenalazotemiafrom decreased p.o. intake and projectile vomiting Improved.Continue on IV fluids since n.p.o. Avoid nephrotoxic drugs. --monitor BMP daily   DVT prophylaxis:on heparin gtt Code Status: Full Code Family Communication:none at bedside Disposition Plan:Pending further improvement and placement of a feeding tube, likely will need to be done by surgery.  Patient continues to require hospital level care until nutrition can be addressed with feeding tube placement, and will need close monitoring once being fed due to high risk for refeeding syndrome.   Consultants:  Gastroenterology  Oncology  Procedures: EGD 1/26  Antimicrobials:  None   Objective: Vitals:   05/18/19 0801 05/19/19 0029 05/19/19 0732 05/19/19 1323  BP: (!) 149/78 (!) 138/91 127/72 (!) 146/95  Pulse: 61 74 70 75  Resp: 16 16 18 14   Temp:  98.4 F (36.9 C) (!) 97.5 F (36.4 C)   TempSrc:      SpO2: 100% 99% 100% 100%  Weight:      Height:        Intake/Output Summary (Last 24 hours) at 05/19/2019 1327 Last data filed at 05/19/2019 1119 Gross per 24 hour  Intake 5434.4 ml  Output 1050 ml  Net 4384.4 ml   Filed Weights   05/15/2019 1006 05/08/2019 0859  Weight:  67 kg 60.8 kg    Examination:  General exam: awake, alert, no acute distress, underweight HEENT: dry mucus membranes, hearing grossly normal  Respiratory system: clear to  auscultation bilaterally, no wheezes, rales or rhonchi, normal respiratory effort. Cardiovascular system: normal S1/S2, RRR, no pedal edema.   Gastrointestinal system: soft, non-tender, non-distended abdomen Central nervous system: no gross focal neurologic deficits, normal speech Extremities: moves all  no cyanosis, normal tone Psychiatry: normal mood, congruent affect, judgement and insight appear normal    Data Reviewed: I have personally reviewed following labs and imaging studies  CBC: Recent Labs  Lab 05/15/19 1032 04/24/2019 0425 05/17/19 0201 05/18/19 0558 05/19/19 0401  WBC 4.8 4.7 4.4 4.2 4.8  HGB 11.5* 11.1* 10.4* 10.6* 11.8*  HCT 37.1* 36.1* 32.6* 32.7* 35.5*  MCV 97.1 96.0 94.8 91.9 90.1  PLT 244 249 222 196 676   Basic Metabolic Panel: Recent Labs  Lab 05/21/2019 1120 05/12/2019 0425 05/19/19 0825  NA 143 143 133*  K 4.0 3.5 2.7*  CL 106 107 102  CO2 27 26 24   GLUCOSE 97 91 108*  BUN 21 18 6*  CREATININE 1.36* 1.17 0.83  CALCIUM 10.8* 10.1 9.3   GFR: Estimated Creatinine Clearance: 63.1 mL/min (by C-G formula based on SCr of 0.83 mg/dL). Liver Function Tests: Recent Labs  Lab 04/25/2019 1120  AST 18  ALT 10  ALKPHOS 86  BILITOT 0.7  PROT 7.3  ALBUMIN 3.6   Recent Labs  Lab 04/26/2019 1120  LIPASE 29   No results for input(s): AMMONIA in the last 168 hours. Coagulation Profile: Recent Labs  Lab 04/22/2019 1515 05/19/19 0825  INR 1.3* 1.0   Cardiac Enzymes: No results for input(s): CKTOTAL, CKMB, CKMBINDEX, TROPONINI in the last 168 hours. BNP (last 3 results) No results for input(s): PROBNP in the last 8760 hours. HbA1C: No results for input(s): HGBA1C in the last 72 hours. CBG: No results for input(s): GLUCAP in the last 168 hours. Lipid Profile: No results for input(s): CHOL, HDL, LDLCALC, TRIG, CHOLHDL, LDLDIRECT in the last 72 hours. Thyroid Function Tests: No results for input(s): TSH, T4TOTAL, FREET4, T3FREE, THYROIDAB in the last 72  hours. Anemia Panel: No results for input(s): VITAMINB12, FOLATE, FERRITIN, TIBC, IRON, RETICCTPCT in the last 72 hours. Sepsis Labs: No results for input(s): PROCALCITON, LATICACIDVEN in the last 168 hours.  Recent Results (from the past 240 hour(s))  Respiratory Panel by RT PCR (Flu A&B, Covid) - Nasopharyngeal Swab     Status: None   Collection Time: 05/15/2019 11:20 AM   Specimen: Nasopharyngeal Swab  Result Value Ref Range Status   SARS Coronavirus 2 by RT PCR NEGATIVE NEGATIVE Final    Comment: (NOTE) SARS-CoV-2 target nucleic acids are NOT DETECTED. The SARS-CoV-2 RNA is generally detectable in upper respiratoy specimens during the acute phase of infection. The lowest concentration of SARS-CoV-2 viral copies this assay can detect is 131 copies/mL. A negative result does not preclude SARS-Cov-2 infection and should not be used as the sole basis for treatment or other patient management decisions. A negative result may occur with  improper specimen collection/handling, submission of specimen other than nasopharyngeal swab, presence of viral mutation(s) within the areas targeted by this assay, and inadequate number of viral copies (<131 copies/mL). A negative result must be combined with clinical observations, patient history, and epidemiological information. The expected result is Negative. Fact Sheet for Patients:  PinkCheek.be Fact Sheet for Healthcare Providers:  GravelBags.it This test is not yet ap proved or  cleared by the [place] and  has been authorized for detection and/or diagnosis of SARS-CoV-2 by FDA under an Emergency Use Authorization (EUA). This EUA will remain  in effect (meaning this test can be used) for the duration of the COVID-19 declaration under Section 564(b)(1) of the Act, 21 U.S.C. section 360bbb-3(b)(1), unless the authorization is terminated or revoked sooner.    Influenza A by PCR  NEGATIVE NEGATIVE Final   Influenza B by PCR NEGATIVE NEGATIVE Final    Comment: (NOTE) The Xpert Xpress SARS-CoV-2/FLU/RSV assay is intended as an aid in  the diagnosis of influenza from Nasopharyngeal swab specimens and  should not be used as a sole basis for treatment. Nasal washings and  aspirates are unacceptable for Xpert Xpress SARS-CoV-2/FLU/RSV  testing. Fact Sheet for Patients: PinkCheek.be Fact Sheet for Healthcare Providers: GravelBags.it This test is not yet approved or cleared by the [place] FDA and  has been authorized for detection and/or diagnosis of SARS-CoV-2 by  FDA under an Emergency Use Authorization (EUA). This EUA will remain  in effect (meaning this test can be used) for the duration of the  Covid-19 declaration under Section 564(b)(1) of the Act, 21  U.S.C. section 360bbb-3(b)(1), unless the authorization is  terminated or revoked. Performed at [facility], [address], [place], [place] [address]          Radiology Studies: CT HEAD WO CONTRAST  Result Date: 05/18/2019 CLINICAL DATA:  Initial evaluation for acute trauma, fall. EXAM: CT HEAD WITHOUT CONTRAST TECHNIQUE: Contiguous axial images were obtained from the base of the skull through the vertex without intravenous contrast. COMPARISON:  Prior head CT from 10/22/2016. FINDINGS: Brain: Generalized age-related cerebral atrophy with mild chronic small vessel ischemic disease. No acute intracranial hemorrhage. No acute large vessel territory infarct. No mass lesion, midline shift or mass effect. No hydrocephalus. No extra-axial fluid collection. Vascular: No hyperdense vessel. Calcified atherosclerosis present at skull base. Skull: Scalp soft tissues within normal limits.  Calvarium intact. Sinuses/Orbits: Globes and orbital soft tissues demonstrate no acute finding. Paranasal sinuses and mastoid air cells are clear. Other: None.  IMPRESSION: 1. No acute intracranial abnormality. 2. Mild age-related cerebral atrophy with chronic small vessel ischemic disease. Electronically Signed   By: Jeannine Boga M.D.   On: 05/18/2019 00:47   IR IMAGING GUIDED PORT INSERTION  Result Date: 05/19/2019 CLINICAL DATA:  Metastatic adenocarcinoma of the right lung with central tumor and lymphadenopathy causing stricture and obstruction of the esophagus. The esophagus was unable to be successfully dilated endoscopically. The patient requires a port for chemotherapy and also a gastrostomy tube for nutrition. EXAM: 1. IMPLANTED PORT A CATH PLACEMENT WITH ULTRASOUND AND FLUOROSCOPIC GUIDANCE 2. ABORTED ATTEMPT AT PERCUTANEOUS GASTROSTOMY TUBE PLACEMENT ANESTHESIA/SEDATION: 2.0 mg IV Versed; 100 mcg IV Fentanyl Total Moderate Sedation Time:  45 minutes The patient's level of consciousness and physiologic status were continuously monitored during the procedure by Radiology nursing. Additional Medications: 2 g IV Ancef. FLUOROSCOPY TIME:  3 minutes and 18 seconds.  58.3 mGy. PROCEDURE: The procedure, risks, benefits, and alternatives were explained to the patient. Questions regarding the procedure were encouraged and answered. The patient understands and consents to the procedure. A time-out was performed prior to initiating the procedure. Ultrasound was utilized to confirm patency of the right internal jugular vein. The right neck and chest were prepped with chlorhexidine in a sterile fashion, and a sterile drape was applied covering the operative field. Maximum barrier sterile technique with sterile gowns  and gloves were used for the procedure. Local anesthesia was provided with 1% lidocaine. After creating a small venotomy incision, a 21 gauge needle was advanced into the right internal jugular vein under direct, real-time ultrasound guidance. Ultrasound image documentation was performed. After securing guidewire access, an 8 Fr dilator was placed. A  J-wire was kinked to measure appropriate catheter length. A subcutaneous port pocket was then created along the upper chest wall utilizing sharp and blunt dissection. Portable cautery was utilized. The pocket was irrigated with sterile saline. A single lumen power injectable port was chosen for placement. The 8 Fr catheter was tunneled from the port pocket site to the venotomy incision. The port was placed in the pocket. External catheter was trimmed to appropriate length based on guidewire measurement. At the venotomy, an 8 Fr peel-away sheath was placed over a guidewire. The catheter was then placed through the sheath and the sheath removed. Final catheter positioning was confirmed and documented with a fluoroscopic spot image. The port was accessed with a needle and aspirated and flushed with heparinized saline. The access needle was removed. The venotomy and port pocket incisions were closed with subcutaneous 3-0 Monocryl and subcuticular 4-0 Vicryl. Dermabond was applied to both incisions. After port placement, a 5 [place] the catheter was advanced through the mouth under fluoroscopy and into the proximal esophagus. The catheter was further advanced to the level of the mid esophagus under fluoroscopy and a guidewire advanced into the esophageal lumen. The catheter was then advanced over the guidewire and attempt made to advanced both catheter and guidewire access distally into the lower esophagus. Ultimately, catheter and wire were removed. COMPLICATIONS: COMPLICATIONS None FINDINGS: After port placement, the catheter tip lies at the cavo-atrial junction. The catheter aspirates normally and is ready for immediate use. Percutaneous gastrostomy tube placement was aborted and unsuccessful. A 5 French catheter and guidewire could not be successfully advanced beyond a high-grade stricture and obstruction of the mid esophagus. There is retained barium and debris present within the mid esophagus above the level  of esophageal obstruction. Catheter and guidewire were able to be advanced to the level of obstruction but could not be passed through the obstruction. A percutaneous gastrostomy tube can not be placed without ability to insufflate the stomach with air under fluoroscopy. IMPRESSION: 1. Placement of single lumen port a cath via right internal jugular vein. The catheter tip lies at the cavo-atrial junction. A power injectable port a cath was placed and is ready for immediate use. 2. Inability to place percutaneous gastrostomy tube due to failure to pass a 5 French catheter through the level of high-grade mid esophageal obstruction to allow insufflation of the stomach with air. Electronically Signed   By: Aletta Edouard M.D.   On: 05/19/2019 13:13        Scheduled Meds: . fentaNYL      . midazolam       Continuous Infusions: . sodium chloride Stopped (05/13/2019 1204)  . dextrose 5 % and 0.45 % NaCl with KCl 40 mEq/L 75 mL/hr at 05/19/19 1119  . dextrose 5 % and 0.45% NaCl Stopped (05/19/19 1020)  . potassium chloride Stopped (05/19/19 1154)     LOS: 5 days    Time spent: 30 minutes    Ezekiel Slocumb, DO [facility]   If 7PM-7AM, please contact night-coverage [email or web address] 05/19/2019, 1:27 PM

## 2019-05-19 NOTE — Progress Notes (Signed)
PT Cancellation Note  Patient Details Name: Carlos Barrera MRN: [id number] DOB: 04/11/1941   Cancelled Treatment:    Reason Eval/Treat Not Completed: Medical issues which prohibited therapy(Holding PT evaluation until later date/time once K+ is back in safe limits per facility policy.)  0:37 AM, 04/88/89 Etta Grandchild, PT, DPT Physical Therapist - [facility]  [phone] ([place])    Sayda Grable C 05/19/2019, 9:54 AM

## 2019-05-20 ENCOUNTER — Inpatient Hospital Stay: Payer: Medicare Other | Admitting: Registered Nurse

## 2019-05-20 ENCOUNTER — Encounter: Admission: EM | Disposition: E | Payer: Self-pay | Source: Home / Self Care | Attending: Internal Medicine

## 2019-05-20 ENCOUNTER — Encounter: Payer: Self-pay | Admitting: Internal Medicine

## 2019-05-20 HISTORY — PX: GASTROSTOMY: SHX5249

## 2019-05-20 LAB — BASIC METABOLIC PANEL
Anion gap: 8 (ref 5–15)
BUN: 6 mg/dL — ABNORMAL LOW (ref 8–23)
CO2: 22 mmol/L (ref 22–32)
Calcium: 9.7 mg/dL (ref 8.9–10.3)
Chloride: 103 mmol/L (ref 98–111)
Creatinine, Ser: 0.92 mg/dL (ref 0.61–1.24)
GFR calc Af Amer: >60 mL/min (ref >60)
GFR calc non Af Amer: >60 mL/min (ref >60)
Glucose, Bld: 105 mg/dL — ABNORMAL HIGH (ref 70–99)
Potassium: 4.1 mmol/L (ref 3.5–5.1)
Sodium: 133 mmol/L — ABNORMAL LOW (ref 135–145)

## 2019-05-20 LAB — HEPARIN LEVEL (UNFRACTIONATED): Heparin Unfractionated: 0.41 IU/mL (ref 0.30–0.70)

## 2019-05-20 LAB — CBC
HCT: 35.4 % — ABNORMAL LOW (ref 39.0–52.0)
Hemoglobin: 11.6 g/dL — ABNORMAL LOW (ref 13.0–17.0)
MCH: 29.7 pg (ref 26.0–34.0)
MCHC: 32.8 g/dL (ref 30.0–36.0)
MCV: 90.8 fL (ref 80.0–100.0)
Platelets: 214 10*3/uL (ref 150–400)
RBC: 3.9 MIL/uL — ABNORMAL LOW (ref 4.22–5.81)
RDW: 16.2 % — ABNORMAL HIGH (ref 11.5–15.5)
WBC: 5.1 10*3/uL (ref 4.0–10.5)
nRBC: 0 % (ref 0.0–0.2)

## 2019-05-20 LAB — MAGNESIUM: Magnesium: 1.7 mg/dL (ref 1.7–2.4)

## 2019-05-20 LAB — PHOSPHORUS: Phosphorus: 1.9 mg/dL — ABNORMAL LOW (ref 2.5–4.6)

## 2019-05-20 SURGERY — INSERTION OF GASTROSTOMY TUBE
Anesthesia: General | Site: Abdomen

## 2019-05-20 MED ORDER — ONDANSETRON HCL 4 MG/2ML IJ SOLN
INTRAMUSCULAR | Status: AC
  Filled 2019-05-20: qty 2

## 2019-05-20 MED ORDER — PROPOFOL 10 MG/ML IV BOLUS
INTRAVENOUS | Status: DC | PRN
  Administered 2019-05-20: 80 mg via INTRAVENOUS

## 2019-05-20 MED ORDER — ROCURONIUM BROMIDE 50 MG/5ML IV SOLN
INTRAVENOUS | Status: AC
  Filled 2019-05-20: qty 1

## 2019-05-20 MED ORDER — PROPOFOL 10 MG/ML IV BOLUS
INTRAVENOUS | Status: AC
  Filled 2019-05-20: qty 20

## 2019-05-20 MED ORDER — SODIUM PHOSPHATES 45 MMOLE/15ML IV SOLN
10.0000 mmol | Freq: Once | INTRAVENOUS | Status: AC
  Administered 2019-05-20: 10 mmol via INTRAVENOUS
  Filled 2019-05-20: qty 3.33

## 2019-05-20 MED ORDER — BUPIVACAINE LIPOSOME 1.3 % IJ SUSP
INTRAMUSCULAR | Status: DC | PRN
  Administered 2019-05-20: 20 mL

## 2019-05-20 MED ORDER — SUCCINYLCHOLINE CHLORIDE 20 MG/ML IJ SOLN
INTRAMUSCULAR | Status: AC
  Filled 2019-05-20: qty 1

## 2019-05-20 MED ORDER — DEXAMETHASONE SODIUM PHOSPHATE 10 MG/ML IJ SOLN
INTRAMUSCULAR | Status: AC
  Filled 2019-05-20: qty 1

## 2019-05-20 MED ORDER — SUCCINYLCHOLINE CHLORIDE 20 MG/ML IJ SOLN
INTRAMUSCULAR | Status: DC | PRN
  Administered 2019-05-20: 100 mg via INTRAVENOUS

## 2019-05-20 MED ORDER — IPRATROPIUM-ALBUTEROL 0.5-2.5 (3) MG/3ML IN SOLN
3.0000 mL | Freq: Once | RESPIRATORY_TRACT | Status: AC
  Administered 2019-05-20: 17:00:00 3 mL via RESPIRATORY_TRACT

## 2019-05-20 MED ORDER — DEXAMETHASONE SODIUM PHOSPHATE 10 MG/ML IJ SOLN
INTRAMUSCULAR | Status: DC | PRN
  Administered 2019-05-20: 8 mg via INTRAVENOUS

## 2019-05-20 MED ORDER — BUPIVACAINE LIPOSOME 1.3 % IJ SUSP
INTRAMUSCULAR | Status: AC
  Filled 2019-05-20: qty 20

## 2019-05-20 MED ORDER — ONDANSETRON HCL 4 MG/2ML IJ SOLN: 4.0000 mg | Freq: Once | INTRAMUSCULAR | Status: DC | PRN

## 2019-05-20 MED ORDER — IPRATROPIUM-ALBUTEROL 0.5-2.5 (3) MG/3ML IN SOLN
RESPIRATORY_TRACT | Status: AC
  Filled 2019-05-20: qty 3

## 2019-05-20 MED ORDER — BUPIVACAINE-EPINEPHRINE (PF) 0.5% -1:200000 IJ SOLN
INTRAMUSCULAR | Status: DC | PRN
  Administered 2019-05-20: 30 mL

## 2019-05-20 MED ORDER — BUPIVACAINE HCL (PF) 0.5 % IJ SOLN
INTRAMUSCULAR | Status: AC
  Filled 2019-05-20: qty 30

## 2019-05-20 MED ORDER — EPINEPHRINE PF 1 MG/ML IJ SOLN
INTRAMUSCULAR | Status: AC
  Filled 2019-05-20: qty 1

## 2019-05-20 MED ORDER — MAGNESIUM SULFATE 2 GM/50ML IV SOLN
2.0000 g | Freq: Once | INTRAVENOUS | Status: AC
  Administered 2019-05-20: 2 g via INTRAVENOUS
  Filled 2019-05-20 (×2): qty 50

## 2019-05-20 MED ORDER — SUGAMMADEX SODIUM 200 MG/2ML IV SOLN
INTRAVENOUS | Status: DC | PRN
  Administered 2019-05-20: 200 mg via INTRAVENOUS

## 2019-05-20 MED ORDER — LIDOCAINE HCL (PF) 2 % IJ SOLN
INTRAMUSCULAR | Status: AC
  Filled 2019-05-20: qty 5

## 2019-05-20 MED ORDER — ONDANSETRON HCL 4 MG/2ML IJ SOLN
INTRAMUSCULAR | Status: DC | PRN
  Administered 2019-05-20: 4 mg via INTRAVENOUS

## 2019-05-20 MED ORDER — PHENYLEPHRINE HCL (PRESSORS) 10 MG/ML IV SOLN
INTRAVENOUS | Status: DC | PRN
  Administered 2019-05-20: 150 ug via INTRAVENOUS
  Administered 2019-05-20: 100 ug via INTRAVENOUS

## 2019-05-20 MED ORDER — FENTANYL CITRATE (PF) 100 MCG/2ML IJ SOLN: 25.0000 ug | INTRAMUSCULAR | Status: DC | PRN

## 2019-05-20 MED ORDER — MORPHINE SULFATE (PF) 2 MG/ML IV SOLN
1.0000 mg | INTRAVENOUS | Status: DC | PRN
  Administered 2019-05-21 – 2019-05-22 (×3): 1 mg via INTRAVENOUS
  Filled 2019-05-20 (×3): qty 1

## 2019-05-20 MED ORDER — LIDOCAINE HCL (CARDIAC) PF 100 MG/5ML IV SOSY
PREFILLED_SYRINGE | INTRAVENOUS | Status: DC | PRN
  Administered 2019-05-20: 80 mg via INTRAVENOUS

## 2019-05-20 MED ORDER — SUGAMMADEX SODIUM 200 MG/2ML IV SOLN
INTRAVENOUS | Status: AC
  Filled 2019-05-20: qty 2

## 2019-05-20 MED ORDER — FENTANYL CITRATE (PF) 100 MCG/2ML IJ SOLN
INTRAMUSCULAR | Status: DC | PRN
  Administered 2019-05-20 (×2): 50 ug via INTRAVENOUS

## 2019-05-20 MED ORDER — LACTATED RINGERS IV SOLN: INTRAVENOUS | Status: DC | PRN

## 2019-05-20 MED ORDER — SEVOFLURANE IN SOLN
RESPIRATORY_TRACT | Status: AC
  Filled 2019-05-20: qty 250

## 2019-05-20 MED ORDER — ROCURONIUM BROMIDE 100 MG/10ML IV SOLN
INTRAVENOUS | Status: DC | PRN
  Administered 2019-05-20: 20 mg via INTRAVENOUS

## 2019-05-20 MED ORDER — ONDANSETRON HCL 4 MG/2ML IJ SOLN: 4.0000 mg | Freq: Four times a day (QID) | INTRAMUSCULAR | Status: DC | PRN

## 2019-05-20 MED ORDER — FENTANYL CITRATE (PF) 100 MCG/2ML IJ SOLN
INTRAMUSCULAR | Status: AC
  Filled 2019-05-20: qty 2

## 2019-05-20 SURGICAL SUPPLY — 40 items
ADH SKN CLS APL DERMABOND .7 (GAUZE/BANDAGES/DRESSINGS) ×1
APL PRP STRL LF DISP 70% ISPRP (MISCELLANEOUS) ×1
CANISTER SUCT 1200ML W/VALVE (MISCELLANEOUS) ×3 IMPLANT
CATH ROBINSON RED A/P 16FR (CATHETERS) ×3 IMPLANT
CHLORAPREP W/TINT 26 (MISCELLANEOUS) ×3 IMPLANT
COVER WAND RF STERILE (DRAPES) ×3 IMPLANT
DERMABOND ADVANCED (GAUZE/BANDAGES/DRESSINGS) ×2
DERMABOND ADVANCED .7 DNX12 (GAUZE/BANDAGES/DRESSINGS) ×1 IMPLANT
DRAPE LAPAROTOMY 100X77 ABD (DRAPES) ×3 IMPLANT
DRSG OPSITE POSTOP 4X8 (GAUZE/BANDAGES/DRESSINGS) ×2 IMPLANT
DRSG TEGADERM 4X4.75 (GAUZE/BANDAGES/DRESSINGS) ×3 IMPLANT
ELECT CAUTERY BLADE 6.4 (BLADE) ×3 IMPLANT
ELECT REM PT RETURN 9FT ADLT (ELECTROSURGICAL) ×3
ELECTRODE REM PT RTRN 9FT ADLT (ELECTROSURGICAL) ×1 IMPLANT
GAUZE SPONGE 4X4 12PLY STRL (GAUZE/BANDAGES/DRESSINGS) ×3 IMPLANT
GLOVE BIO SURGEON STRL SZ 6.5 (GLOVE) ×1 IMPLANT
GLOVE BIO SURGEONS STRL SZ 6.5 (GLOVE) ×1
KIT TURNOVER KIT A (KITS) ×3 IMPLANT
NEEDLE HYPO 22GX1.5 SAFETY (NEEDLE) ×3 IMPLANT
PACK BASIN MAJOR ARMC (MISCELLANEOUS) ×3 IMPLANT
RETRACTOR WOUND ALXS 18CM MED (MISCELLANEOUS) IMPLANT
RTRCTR WOUND ALEXIS O 18CM MED (MISCELLANEOUS) ×3
STAPLER SKIN PROX 35W (STAPLE) ×2 IMPLANT
SUT ETHILON 2 0 FS 18 (SUTURE) ×5 IMPLANT
SUT MNCRL 4-0 (SUTURE)
SUT MNCRL 4-0 27XMFL (SUTURE)
SUT PDS AB 1 CT1 36 (SUTURE) ×4 IMPLANT
SUT PDSII 8 18 CT-1 CR8 (SUTURE) ×2 IMPLANT
SUT SILK 2 0 SH CR/8 (SUTURE) ×3 IMPLANT
SUT SILK 3-0 (SUTURE) ×5 IMPLANT
SUT VIC AB 3-0 SH 27 (SUTURE) ×9
SUT VIC AB 3-0 SH 27X BRD (SUTURE) ×1 IMPLANT
SUTURE MNCRL 4-0 27XMF (SUTURE) ×1 IMPLANT
SYR 20ML LL LF (SYRINGE) ×3 IMPLANT
SYR 30ML LL (SYRINGE) ×3 IMPLANT
TUBE GASTRO 14F 5C (TUBING) ×1 IMPLANT
TUBE JEJUNO 16X14 (TUBING) ×1 IMPLANT
TUBE JEJUNO 18X14 (TUBING) ×1 IMPLANT
TUBE JEJUNO 22X14 (TUBING) ×2 IMPLANT
WATER STERILE IRR 1000ML POUR (IV SOLUTION) ×3 IMPLANT

## 2019-05-20 NOTE — Progress Notes (Signed)
Patient with audible wheezing until coughs with lung sounds clear bilateral. Pt with positive bowel sounds. Dr Nicole Kindred aware of periods of apnea and aware continuous pulse ox ordered per Dr Kayleen Memos.

## 2019-05-20 NOTE — Progress Notes (Signed)
OT Cancellation Note  Patient Details Name: [name] MRN: [id number] DOB: [birth year]   Cancelled Treatment:    Reason Eval/Treat Not Completed: Patient at procedure or test/ unavailable. Order received, chart reviewed. Pt out of room for procedure. Per chart, scheduled for G tube this afternoon. Will re-attempt OT evaluation at later date/time as pt is available and medically appropriate. If pt requires general anesthesia, a new OT consult will be required, or a continue at transfer in place.  Jeni Salles, MPH, MS, OTR/L ascom [phone] 04/28/2019, 2:54 PM

## 2019-05-20 NOTE — Op Note (Signed)
Preoperative diagnosis: Esophageal stricture Postoperative diagnosis: Same.   Procedure: Open gastrostomy tube placement Anesthesia: GETA  Surgeon: Lysle Pearl  Wound Classification: Clean Contaminated  Specimen: none  Complications: None  EBL: 18mL  Indications:  Patient is a 79 y.o. male history of lung adeno CA presenting with gradual esophageal stricture secondary to external compression.  Due to inability to tolerate oral intake, surgery requested for gastrostomy tube placement for nutrition.  Previous IR and GI methods to place the gastrostomy tube was unsuccessful.  Description of procedure: The patient was taken to the operating room and placed in the supine position. General endotracheal anesthesia was induced without any difficulty. A time-out was completed verifying correct patient, procedure, site, positioning, and implant(s) and/or special equipment prior to beginning this procedure.   Subxiphoid midline incision made and sharp dissection carried down to the abdominal wall fascia.  Fascia was then sharply incised and abdominal cavity entered.  No obvious pathology was noted within the area that was visible.  Babcocks were used to visualize the anterior portion of the stomach.  Left upper quadrant site was chosen for gastrostomy tube placement.  Small skin incision was made in dissection carried down through the abdominal wall.  29 French gastrostomy tube was then placed through this newly created hole.  Place was chosen on the anterior portion of the stomach where a gastrostomy was made and the previously placed gastrostomy tube was placed into this newly created hole and 20 mL of water was infused into the bulb to secure in place.  2-0 Vicryl was placed in the pursestring fashion around the previously made gastrostomy and secured down to further secure the tube into the stomach.  The stomach was then brought up to the abdominal wall insertion site with minimal tension and the 2-0 Vicryl  that was used with pursestring suture was used to pexy stomach to the anterior abdominal wall.  An additional 2-0 Vicryl was then used to further secure it to the abdominal wall.  Prior to closure of the midline incision it was noted that the stomach was under minimal tension and the tube remained in place.  Exparel was then infused into the midline incision prior to closing with 1 PDS x2.  Skin was then approximated with staples, dressed with honeycomb dressing.  The gastrostomy tube stopper was placed snug to the skin and the tube was noted to be at the 4 cm mark at skin level.  The stopper was secured to the skin using 3-0 nylon x4.  Patient was then transferred to PACU after awakened from anesthesia.  Pungent instrument counts were correct at the end the procedure.Marland Kitchen

## 2019-05-20 NOTE — Anesthesia Preprocedure Evaluation (Addendum)
Anesthesia Evaluation  Patient identified by MRN, date of birth, ID band Patient awake    Reviewed: Allergy & Precautions, H&P , NPO status , Patient's Chart, lab work & pertinent test results  Airway Mallampati: II  TM Distance: >3 FB     Dental  (+) Chipped   Pulmonary asthma , COPD, former smoker,  Lung cancer          Cardiovascular hypertension, +CHF (grade II diastolic dysfunction)  + dysrhythmias Atrial Fibrillation   Moderate pericardial effusion, not impairing heart function, conservative treatment recommended   Neuro/Psych PSYCHIATRIC DISORDERS Anxiety negative neurological ROS     GI/Hepatic Neg liver ROS, GERD  ,Admitted with projectile vomiting Esophageal stricture   Endo/Other  Hypothyroidism   Renal/GU negative Renal ROS  negative genitourinary   Musculoskeletal  (+) Arthritis ,   Abdominal   Peds  Hematology negative hematology ROS (+)   Anesthesia Other Findings Past Medical History: No date: Agent orange exposure No date: Asthma     Comment:  exercise induced asthma  No date: COPD (chronic obstructive pulmonary disease) (HCC) No date: DJD (degenerative joint disease)     Comment:  a. 02/2019 s/p L TKA. No date: GERD (gastroesophageal reflux disease) No date: Hearing loss     Comment:  wears hearing aids No date: History of cardiac cath     Comment:  a. History of 2 cardiac catheterizations, last ~ 10 yrs               ago @ [facility]-->reportedly nl. No date: History of stress test     Comment:  a. 2020 - pharmacologic stress testing @               [facility]-->reportedly nl. No date: Hyperlipidemia No date: Hypertension No date: Hypothyroidism No date: Insomnia No date: Lung cancer ([place])     Comment:  a. Dx 03/2019. No date: Pericardial effusion     Comment:  a. 03/2019 Echo: EF 60-65%, no rwma, nl RV fxn. Mildly               dil LA. Mod pericardia eff w/o tamponade. Mild Ao                sclerosis. No date: Prostate cancer ([place]) No date: PTSD (post-traumatic stress disorder) No date: Seasonal allergies No date: Thyroid disease  Past Surgical History: No date: BACK SURGERY     Comment:  x 2 No date: COLONOSCOPY     Comment:  hx polyps No date: HERNIA REPAIR No date: INCISE AND DRAIN ABCESS     Comment:  patient denies this procedure 03/03/19 No date: NECK SURGERY     Comment:  x 1 04/28/2019: PERICARDIOCENTESIS; N/A     Comment:  Procedure: PERICARDIOCENTESIS;  Surgeon: Wellington Hampshire, MD;  Location: [facility];  Service:               Cardiovascular;  Laterality: N/A; No date: TONSILLECTOMY 03/09/2019: TOTAL KNEE ARTHROPLASTY; Left 03/09/2019: TOTAL KNEE ARTHROPLASTY; Left     Comment:  Procedure: LEFT TOTAL KNEE ARTHROPLASTY;  Surgeon: Meredith Pel, MD;  Location: [place];  Service:               Orthopedics;  Laterality: Left; No date: WISDOM TOOTH EXTRACTION  BMI    Body Mass Index: 20.38  kg/m Echo 05/10/19    EF55-60% with no wall motion abnormalities     Reproductive/Obstetrics negative OB ROS                            Anesthesia Physical  Anesthesia Plan  ASA: III  Anesthesia Plan: General   Post-op Pain Management:    Induction: Intravenous, Rapid sequence and Cricoid pressure planned  PONV Risk Score and Plan:   Airway Management Planned: Oral ETT  Additional Equipment:   Intra-op Plan:   Post-operative Plan: Extubation in OR  Informed Consent: I have reviewed the patients History and Physical, chart, labs and discussed the procedure including the risks, benefits and alternatives for the proposed anesthesia with the patient or authorized representative who has indicated his/her understanding and acceptance.     Dental Advisory Given  Plan Discussed with: Anesthesiologist and CRNA  Anesthesia Plan Comments:         Anesthesia Quick Evaluation

## 2019-05-20 NOTE — OR Nursing (Signed)
RN handoff completed. Per MD passed on that G tube at 4cm position do not touch balloon, also do not restart heparin until consulting with MD Lysle Pearl.

## 2019-05-20 NOTE — Transfer of Care (Signed)
Immediate Anesthesia Transfer of Care Note  Patient: Carlos Barrera  Procedure(s) Performed: INSERTION OF GASTROSTOMY TUBE (N/A Abdomen)  Patient Location: PACU  Anesthesia Type:General  Level of Consciousness: sedated  Airway & Oxygen Therapy: Patient Spontanous Breathing  Post-op Assessment: Report given to RN and Post -op Vital signs reviewed and stable  Post vital signs: Reviewed and stable  Last Vitals:  Vitals Value Taken Time  BP 174/115 04/23/2019 1607  Temp 36.7 C 04/27/2019 1606  Pulse 92 04/28/2019 1608  Resp 12 05/03/2019 1608  SpO2 97 % 04/21/2019 1608  Vitals shown include unvalidated device data.  Last Pain:  Vitals:   04/21/2019 1606  TempSrc:   PainSc: 0-No pain         Complications: No apparent anesthesia complications

## 2019-05-20 NOTE — Progress Notes (Addendum)
PROGRESS NOTE    [name]  ERX:[id number] DOB: [birth year] DOA: 05/10/2019  PCP: [facility]    LOS - 6   Brief Narrative:  Carlos Bacon Howlandis a 79 y.o.malewith a hx of 79 y.o.malewith a hx of atrial fibrillationon amiodarone and Eliquis, COPD, HTN, HLD, agent orange exposure, lung cancer, remote prostate cancer, hypothyroidism, remote tobacco abuse (40 pack years, quit 1982)presented with dysphagiato solids and liquids andprojectile vomiting. Barium esophagram showedbarium to be grossly retained at the midesophagus with tight, strictured appearing caliber of the esophagus at the level of the left mainstem bronchus.Subsequently taken for EGD by gastroenterology. Upper 1/3 of esophagus with retained food. Dilation was attempted unsuccessfully. Narrowing of esophagus is extrinsic Chemo port placed by IR on 1/29. IR was unable to place PEG tube due to retained esophageal contents and stenosis, unable to pass catheter through esophagus.  Surgery today for feeding tube placement.   Subjective 1/30: Patient seen this AM, laying fetal position, naked.  Asks for help getting back situated in the bed.  Denies other complaints including pain/discomfort, chest pain, SOB.  He did not report nausea to me, but RN requested Zofran for him.    Assessment & Plan:   Active Problems:   Adenocarcinoma of right lung (HCC)   Paroxysmal atrial fibrillation (HCC)   Intractable vomiting   Pericardial effusion   Hypertension   Dysphagia   Problems with swallowing and mastication   Stricture and stenosis of esophagus   Protein-calorie malnutrition, severe   Dysphagiasecondary to esophageal stenosis See narrative about for hospital course to date. Esophageal narrowing due to extrinsic cause, most likely compression by tumor/adenopathy (see PET scan report of 1/19). --PEG tube was not able to be placed by IR due to esophageal stenosis and retained materials --general surgery  consulted for feeding tube placement --surgery this afternoon for tube placement --hopefully can start tube feeds tomorrow, will be per surgery if tube okay for use --dietician for tube feed regimen --holding all PO meds --GI signed off  Hypophosphatemia - pharmacy consulted for replacement of electrolytes. Monitor phos daily and replace as needed.  High risk for refeeding syndrome once feeds are able to be started.  Essential Hypertension- chronic, stable without antihypertensives  Paroxysmal A-fib- rate controlled --hold POamiodarone(NPOdue to dysphagia) --if becomes uncontrolled, will need amiodarone gtt (in stepdown/ICU) --Eliquis on hold  --resume heparin gtt after procedures, timing per IR  Stage IV adenocarcinoma of the lung, recently diagnosed, widespread bony mets --followed by Dr. Grayland Ormond, consulted --chemo port placed by IR on 1/29 --planning to start palliative chemo Feb 4th  Acute Kidney Injury- prerenalazotemiafrom decreased p.o. intake and projectile vomiting Improved.Continue on IV fluids since n.p.o. Avoid nephrotoxic drugs. --monitor BMP daily   DVT prophylaxis:on heparin gtt Code Status: Full Code Family Communication:none at bedside Disposition Plan:Pending further improvement and placement of a feeding tube, by surgery today.  Patient continues to require hospital level care until nutrition can be addressed, and he will require very close monitoring once being fed due to high risk for refeeding syndrome.   Consultants:  Gastroenterology  Oncology  Procedures: EGD 1/26  Antimicrobials:  None   Objective: Vitals:   05/19/19 0732 05/19/19 1323 05/19/19 2300 04/22/2019 0808  BP: 127/72 (!) 146/95 (!) 150/73 138/60  Pulse: 70 75 72 66  Resp: 18 14 16    Temp: (!) 97.5 F (36.4 C) (!) 97.5 F (36.4 C) 97.6 F (36.4 C)   TempSrc:  Oral Oral   SpO2: 100% 100%  98% 97%  Weight:      Height:        Intake/Output  Summary (Last 24 hours) at 04/27/2019 1506 Last data filed at 04/26/2019 0300 Gross per 24 hour  Intake 1170.61 ml  Output --  Net 1170.61 ml   Filed Weights   05/21/2019 1006 05/08/2019 0859  Weight: 67 kg 60.8 kg    Examination:  General exam: awake, alert, no acute distress, cachectic Respiratory system: clear to auscultation bilaterally, no wheezes, rales or rhonchi, normal respiratory effort. Cardiovascular system: normal S1/S2, RRR, no JVD, murmurs, rubs, gallops, no pedal edema.   Gastrointestinal system: soft, non-tender, non-distended abdomen Central nervous system: oriented x3. normal speech Extremities: moves all, no cyanosis, normal tone Psychiatry: depressed mood, flat affect, judgement and insight appear normal    Data Reviewed: I have personally reviewed following labs and imaging studies  CBC: Recent Labs  Lab 05/15/2019 0425 05/17/19 0201 05/18/19 0558 05/19/19 0401 05/01/2019 0444  WBC 4.7 4.4 4.2 4.8 5.1  HGB 11.1* 10.4* 10.6* 11.8* 11.6*  HCT 36.1* 32.6* 32.7* 35.5* 35.4*  MCV 96.0 94.8 91.9 90.1 90.8  PLT 249 222 196 232 026   Basic Metabolic Panel: Recent Labs  Lab 05/13/2019 1120 05/01/2019 0425 05/19/19 0825 05/17/2019 0446  NA 143 143 133* 133*  K 4.0 3.5 2.7* 4.1  CL 106 107 102 103  CO2 27 26 24 22   GLUCOSE 97 91 108* 105*  BUN 21 18 6* 6*  CREATININE 1.36* 1.17 0.83 0.92  CALCIUM 10.8* 10.1 9.3 9.7  MG  --   --   --  1.7  PHOS  --   --   --  1.9*   GFR: Estimated Creatinine Clearance: 56.9 mL/min (by C-G formula based on SCr of 0.92 mg/dL). Liver Function Tests: Recent Labs  Lab 05/13/2019 1120  AST 18  ALT 10  ALKPHOS 86  BILITOT 0.7  PROT 7.3  ALBUMIN 3.6   Recent Labs  Lab 05/05/2019 1120  LIPASE 29   No results for input(s): AMMONIA in the last 168 hours. Coagulation Profile: Recent Labs  Lab 04/29/2019 1515 05/19/19 0825  INR 1.3* 1.0   Cardiac Enzymes: No results for input(s): CKTOTAL, CKMB, CKMBINDEX, TROPONINI in the  last 168 hours. BNP (last 3 results) No results for input(s): PROBNP in the last 8760 hours. HbA1C: No results for input(s): HGBA1C in the last 72 hours. CBG: No results for input(s): GLUCAP in the last 168 hours. Lipid Profile: No results for input(s): CHOL, HDL, LDLCALC, TRIG, CHOLHDL, LDLDIRECT in the last 72 hours. Thyroid Function Tests: No results for input(s): TSH, T4TOTAL, FREET4, T3FREE, THYROIDAB in the last 72 hours. Anemia Panel: No results for input(s): VITAMINB12, FOLATE, FERRITIN, TIBC, IRON, RETICCTPCT in the last 72 hours. Sepsis Labs: No results for input(s): PROCALCITON, LATICACIDVEN in the last 168 hours.  Recent Results (from the past 240 hour(s))  Respiratory Panel by RT PCR (Flu A&B, Covid) - Nasopharyngeal Swab     Status: None   Collection Time: 04/25/2019 11:20 AM   Specimen: Nasopharyngeal Swab  Result Value Ref Range Status   SARS Coronavirus 2 by RT PCR NEGATIVE NEGATIVE Final    Comment: (NOTE) SARS-CoV-2 target nucleic acids are NOT DETECTED. The SARS-CoV-2 RNA is generally detectable in upper respiratoy specimens during the acute phase of infection. The lowest concentration of SARS-CoV-2 viral copies this assay can detect is 131 copies/mL. A negative result does not preclude SARS-Cov-2 infection and should not be used as the  sole basis for treatment or other patient management decisions. A negative result may occur with  improper specimen collection/handling, submission of specimen other than nasopharyngeal swab, presence of viral mutation(s) within the areas targeted by this assay, and inadequate number of viral copies (<131 copies/mL). A negative result must be combined with clinical observations, patient history, and epidemiological information. The expected result is Negative. Fact Sheet for Patients:  PinkCheek.be Fact Sheet for Healthcare Providers:  GravelBags.it This test is not yet  ap proved or cleared by the [place] FDA and  has been authorized for detection and/or diagnosis of SARS-CoV-2 by FDA under an Emergency Use Authorization (EUA). This EUA will remain  in effect (meaning this test can be used) for the duration of the COVID-19 declaration under Section 564(b)(1) of the Act, 21 U.S.C. section 360bbb-3(b)(1), unless the authorization is terminated or revoked sooner.    Influenza A by PCR NEGATIVE NEGATIVE Final   Influenza B by PCR NEGATIVE NEGATIVE Final    Comment: (NOTE) The Xpert Xpress SARS-CoV-2/FLU/RSV assay is intended as an aid in  the diagnosis of influenza from Nasopharyngeal swab specimens and  should not be used as a sole basis for treatment. Nasal washings and  aspirates are unacceptable for Xpert Xpress SARS-CoV-2/FLU/RSV  testing. Fact Sheet for Patients: PinkCheek.be Fact Sheet for Healthcare Providers: GravelBags.it This test is not yet approved or cleared by the [place] FDA and  has been authorized for detection and/or diagnosis of SARS-CoV-2 by  FDA under an Emergency Use Authorization (EUA). This EUA will remain  in effect (meaning this test can be used) for the duration of the  Covid-19 declaration under Section 564(b)(1) of the Act, 21  U.S.C. section 360bbb-3(b)(1), unless the authorization is  terminated or revoked. Performed at [facility], [address] [place], [place] [address]          Radiology Studies: IR IMAGING GUIDED PORT INSERTION  Result Date: 05/19/2019 CLINICAL DATA:  Metastatic adenocarcinoma of the right lung with central tumor and lymphadenopathy causing stricture and obstruction of the esophagus. The esophagus was unable to be successfully dilated endoscopically. The patient requires a port for chemotherapy and also a gastrostomy tube for nutrition. EXAM: 1. IMPLANTED PORT A CATH PLACEMENT WITH ULTRASOUND AND FLUOROSCOPIC  GUIDANCE 2. ABORTED ATTEMPT AT PERCUTANEOUS GASTROSTOMY TUBE PLACEMENT ANESTHESIA/SEDATION: 2.0 mg IV Versed; 100 mcg IV Fentanyl Total Moderate Sedation Time:  45 minutes The patient's level of consciousness and physiologic status were continuously monitored during the procedure by Radiology nursing. Additional Medications: 2 g IV Ancef. FLUOROSCOPY TIME:  3 minutes and 18 seconds.  58.3 mGy. PROCEDURE: The procedure, risks, benefits, and alternatives were explained to the patient. Questions regarding the procedure were encouraged and answered. The patient understands and consents to the procedure. A time-out was performed prior to initiating the procedure. Ultrasound was utilized to confirm patency of the right internal jugular vein. The right neck and chest were prepped with chlorhexidine in a sterile fashion, and a sterile drape was applied covering the operative field. Maximum barrier sterile technique with sterile gowns and gloves were used for the procedure. Local anesthesia was provided with 1% lidocaine. After creating a small venotomy incision, a 21 gauge needle was advanced into the right internal jugular vein under direct, real-time ultrasound guidance. Ultrasound image documentation was performed. After securing guidewire access, an 8 Fr dilator was placed. A J-wire was kinked to measure appropriate catheter length. A subcutaneous port pocket was then created along the upper chest  wall utilizing sharp and blunt dissection. Portable cautery was utilized. The pocket was irrigated with sterile saline. A single lumen power injectable port was chosen for placement. The 8 Fr catheter was tunneled from the port pocket site to the venotomy incision. The port was placed in the pocket. External catheter was trimmed to appropriate length based on guidewire measurement. At the venotomy, an 8 Fr peel-away sheath was placed over a guidewire. The catheter was then placed through the sheath and the sheath removed.  Final catheter positioning was confirmed and documented with a fluoroscopic spot image. The port was accessed with a needle and aspirated and flushed with heparinized saline. The access needle was removed. The venotomy and port pocket incisions were closed with subcutaneous 3-0 Monocryl and subcuticular 4-0 Vicryl. Dermabond was applied to both incisions. After port placement, a 5 [place] the catheter was advanced through the mouth under fluoroscopy and into the proximal esophagus. The catheter was further advanced to the level of the mid esophagus under fluoroscopy and a guidewire advanced into the esophageal lumen. The catheter was then advanced over the guidewire and attempt made to advanced both catheter and guidewire access distally into the lower esophagus. Ultimately, catheter and wire were removed. COMPLICATIONS: COMPLICATIONS None FINDINGS: After port placement, the catheter tip lies at the cavo-atrial junction. The catheter aspirates normally and is ready for immediate use. Percutaneous gastrostomy tube placement was aborted and unsuccessful. A 5 French catheter and guidewire could not be successfully advanced beyond a high-grade stricture and obstruction of the mid esophagus. There is retained barium and debris present within the mid esophagus above the level of esophageal obstruction. Catheter and guidewire were able to be advanced to the level of obstruction but could not be passed through the obstruction. A percutaneous gastrostomy tube can not be placed without ability to insufflate the stomach with air under fluoroscopy. IMPRESSION: 1. Placement of single lumen port a cath via right internal jugular vein. The catheter tip lies at the cavo-atrial junction. A power injectable port a cath was placed and is ready for immediate use. 2. Inability to place percutaneous gastrostomy tube due to failure to pass a 5 French catheter through the level of high-grade mid esophageal obstruction to allow  insufflation of the stomach with air. Electronically Signed   By: Aletta Edouard M.D.   On: 05/19/2019 13:13        Scheduled Meds: . [MAR Hold] Chlorhexidine Gluconate Cloth  6 each Topical Daily   Continuous Infusions: . sodium chloride Stopped (05/11/2019 1204)  .  ceFAZolin (ANCEF) IV    . dextrose 5 % and 0.45 % NaCl with KCl 40 mEq/L 75 mL/hr at 05/08/2019 1157  . dextrose 5 % and 0.45% NaCl Stopped (05/19/19 1020)     LOS: 6 days    Time spent: 30 minutes    Ezekiel Slocumb, DO [facility]   If 7PM-7AM, please contact night-coverage [email or web address] 05/02/2019, 3:06 PM

## 2019-05-20 NOTE — Progress Notes (Signed)
[place] for  Heparin  Indication: atrial fibrillation  Allergies  Allergen Reactions  . Bee Venom Anaphylaxis  . Feldene [Piroxicam] Hives    Patient Measurements: Height: 5\' 8"  (172.7 cm) Weight: 134 lb 0.6 oz (60.8 kg) IBW/kg (Calculated) : 68.4 Heparin Dosing Weight:  67 kg   Vital Signs: Temp: 97.6 F (36.4 C) (01/29 2300) Temp Source: Oral (01/29 2300) BP: 150/73 (01/29 2300) Pulse Rate: 72 (01/29 2300)  Labs: Recent Labs    05/17/19 0818 05/18/19 0558 05/18/19 0558 05/19/19 0401 05/19/19 0825 05/10/2019 0444  HGB  --  10.6*   < > 11.8*  --  11.6*  HCT  --  32.7*  --  35.5*  --  35.4*  PLT  --  196  --  232  --  214  APTT 87* 98*  --   --   --   --   LABPROT  --   --   --   --  13.4  --   INR  --   --   --   --  1.0  --   HEPARINUNFRC  --  0.63  --  0.54  --  0.41  CREATININE  --   --   --   --  0.83  --    < > = values in this interval not displayed.    Estimated Creatinine Clearance: 63.1 mL/min (by C-G formula based on SCr of 0.83 mg/dL).   Medical History: Past Medical History:  Diagnosis Date  . Agent orange exposure   . Asthma    exercise induced asthma   . COPD (chronic obstructive pulmonary disease) ([place])   . DJD (degenerative joint disease)    a. 02/2019 s/p L TKA.  Marland Kitchen GERD (gastroesophageal reflux disease)   . Hearing loss    wears hearing aids  . History of cardiac cath    a. History of 2 cardiac catheterizations, last ~ 10 yrs ago @ [facility]-->reportedly nl.  . History of stress test    a. 2020 - pharmacologic stress testing @ [facility]-->reportedly nl.  . Hyperlipidemia   . Hypertension   . Hypothyroidism   . Insomnia   . Lung cancer ([place])    a. Dx 03/2019.  Marland Kitchen Pericardial effusion    a. 03/2019 Echo: EF 60-65%, no rwma, nl RV fxn. Mildly dil LA. Mod pericardia eff w/o tamponade. Mild Ao sclerosis.  . Prostate cancer ([place])   . PTSD (post-traumatic stress disorder)   . Seasonal allergies   . Thyroid  disease     Medications:  Facility-Administered Medications Prior to Admission  Medication Dose Route Frequency Provider Last Rate Last Admin  . Leuprolide Acetate (6 Month) (LUPRON) injection 45 mg  45 mg Intramuscular Once Stoioff, Scott C, MD       Medications Prior to Admission  Medication Sig Dispense Refill Last Dose  . amiodarone (PACERONE) 200 MG tablet Take 1 tablet (200 mg total) by mouth 2 (two) times daily. 60 tablet 0 Past Month at Unknown time  . apixaban (ELIQUIS) 5 MG TABS tablet Take 1 tablet (5 mg total) by mouth 2 (two) times daily. 60 tablet 0 Past Month at Unknown time  . cetirizine (ZYRTEC) 10 MG tablet Take 10 mg by mouth daily.    Past Month at Unknown time  . folic acid (FOLVITE) 1 MG tablet Take 1 mg by mouth daily.   Past Month at Unknown time  . folic acid (FOLVITE) 1 MG  tablet Take 1 tablet (1 mg total) by mouth daily. Start 5-7 days before Alimta chemotherapy. Continue until 21 days after Alimta completed. 100 tablet 3 Past Month at Unknown time  . levothyroxine (SYNTHROID) 88 MCG tablet Take 88 mcg by mouth daily before breakfast.   Past Month at Unknown time  . megestrol (MEGACE) 40 MG tablet Take 1 tablet (40 mg total) by mouth daily. 30 tablet 1 Past Month at Unknown time  . ondansetron (ZOFRAN) 8 MG tablet Take 1 tablet (8 mg total) by mouth 2 (two) times daily as needed for refractory nausea / vomiting. 60 tablet 2 Past Month at Unknown time  . pravastatin (PRAVACHOL) 20 MG tablet Take 20 mg by mouth at bedtime.   Past Month at Unknown time  . prazosin (MINIPRESS) 5 MG capsule Take 10 mg by mouth at bedtime.    Past Month at Unknown time  . prochlorperazine (COMPAZINE) 10 MG tablet Take 1 tablet (10 mg total) by mouth every 6 (six) hours as needed (Nausea or vomiting). 60 tablet 2 Past Month at Unknown time  . REFRESH TEARS 0.5 % SOLN Place 1 drop into both eyes 2 (two) times daily.    Past Month at prn  . sertraline (ZOLOFT) 50 MG tablet Take 50 mg by mouth  at bedtime.    Past Month at Unknown time  . tamsulosin (FLOMAX) 0.4 MG CAPS capsule Take 0.4 mg by mouth daily.    Past Month at Unknown time  . traZODone (DESYREL) 50 MG tablet Take 50 mg by mouth at bedtime.   Past Month at Unknown time  . alum & mag hydroxide-simeth (MAALOX/MYLANTA) 200-200-20 MG/5ML suspension Take 30 mLs by mouth every 6 (six) hours as needed for indigestion or heartburn. 355 mL 0 prn at prn  . lidocaine-prilocaine (EMLA) cream Apply to affected area once 30 g 3 prn at prn  . ondansetron (ZOFRAN ODT) 4 MG disintegrating tablet Take 1 tablet (4 mg total) by mouth every 8 (eight) hours as needed for nausea or vomiting. (Patient not taking: Reported on 05/19/2019) 20 tablet 0 Completed Course at Unknown time  . polyethylene glycol (MIRALAX / GLYCOLAX) 17 g packet Take 17 g by mouth daily. 14 each 0 prn at prn    Assessment: Pharmacy consulted to dose heparin for AFib in this 79 year old male who is NPO.  Was on Eliquis PTA.  Heparin infusion stopped 1/29 AM for Port and PEG placement. Port placement successful. PEG unable to be placed due to esophageal stenosis.   Prior to procedures patient's Anti-Xa (Heparin level) was therapeutic on heparin 850 units/hr.   Goal of Therapy:  APTT: 66-102  Heparin level 0.3-0.7 units/ml Monitor platelets by anticoagulation protocol: Yes   Plan:  01/30 @ 0500 HL 0.41 therapeutic. Will continue current rate and will f/u w/ plans for anticoagulation post-procedure, will hold off on ordering repeat HL as earlier level will have to be drawn depending if/when heparin is restarted. Will continue to monitor.  Tobie Lords, PharmD, BCPS Clinical Pharmacist 05/02/2019 6:33 AM

## 2019-05-20 NOTE — Consult Note (Signed)
PHARMACY CONSULT NOTE - FOLLOW UP  Pharmacy Consult for Electrolyte Monitoring and Replacement   Recent Labs: Potassium (mmol/L)  Date Value  05/10/2019 4.1   Magnesium (mg/dL)  Date Value  05/11/2019 1.7   Calcium (mg/dL)  Date Value  05/17/2019 9.7   Albumin (g/dL)  Date Value  05/05/2019 3.6   Phosphorus (mg/dL)  Date Value  05/03/2019 1.9 (L)   Sodium (mmol/L)  Date Value  05/15/2019 133 (L)     Assessment: Patient is a 79 y/o M with medical history including atrial fibrillation on amiodarone and apixaban, COPD, HTN, HLD, agent orange exposure, lung cancer, remote prostate cancer, hypothyroidism, remote tobacco use who has been admitted to [facility] since 1/24 for dysphagia secondary to esophageal stenosis. Patient going for G tube placement 1/30.   Electrolytes today significant for hypophosphatemia. Currently receiving potassium repletion via IV with D5 1/2 with 40 K at 75 mL/hr (72 mEq/day).   Goal of Therapy:  K ~ 4 Mg ~2 All other electrolytes within normal limits  Plan:  -Sodium phosphate 10 mmol IV x 1 -Magnesium 2 g IV x 1 -Electrolytes with AM labs  [place] Resident 05/03/2019 3:29 PM

## 2019-05-20 NOTE — Anesthesia Procedure Notes (Signed)
Procedure Name: Intubation Date/Time: 05/06/2019 2:58 PM Performed by: Hedda Slade, CRNA Pre-anesthesia Checklist: Patient identified, Patient being monitored, Timeout performed, Emergency Drugs available and Suction available Patient Re-evaluated:Patient Re-evaluated prior to induction Oxygen Delivery Method: Circle system utilized Preoxygenation: Pre-oxygenation with 100% oxygen Induction Type: IV induction, Rapid sequence and Cricoid Pressure applied Laryngoscope Size: McGraph and 4 Grade View: Grade I Tube type: Oral Tube size: 7.0 mm Number of attempts: 1 Airway Equipment and Method: Stylet and Video-laryngoscopy Placement Confirmation: ETT inserted through vocal cords under direct vision,  positive ETCO2 and breath sounds checked- equal and bilateral Secured at: 22 cm Tube secured with: Tape Dental Injury: Teeth and Oropharynx as per pre-operative assessment

## 2019-05-20 NOTE — Evaluation (Signed)
Physical Therapy Evaluation Patient Details Name: Carlos Barrera MRN: [id number] DOB: Dec 01, 1940 Today's Date: 04/25/2019   History of Present Illness  79 yo Male has had multiple admissions in last month related to shortness of breath/A-fib. Patient also complains of dysphagia with difficulty swallowing/eating. He was evaluated by gastroenterology and was discovered to have stricture in mid esophagus. They attempted dilation which was unsuccessful. Patient then was supposed to have PEG tube placement on 05/19/19 which was unsuccessful, he did get PICC line placement; He is scheduled for another PEG tube placement attempt this afternoon on 05/09/2019; PMH significant for COPD, HTN, HLD, Agent Orange exposure, lung CA, prostate CA, hypothyroidism;  Clinical Impression  79 yo Male admitted with a-fib and dysphagia. He has multiple co-morbidities which limit his overall functional mobility. Prior to admittance he reports living at home alone. He reports having friends and people in his local church that could assist him as needed. He has a cane, RW and manual wheelchair. He reports being able to walk short distances without AD but was limited due to weakness and shortness of breath. Patient reports 1 new fall in last several months. He is still having significant difficulty with swallowing. PEG tube placement was attempted on 1/29 but unable; He is scheduled for another attempt this afternoon. Patient refused to get out of bed due to fatigue. MMT assessed in supine, grossly 4/5. He would benefit from skilled PT intervention to address safety with gait and out of bed mobility.     Follow Up Recommendations Home health PT    Equipment Recommendations  Other (comment)(will continue to assess)    Recommendations for Other Services       Precautions / Restrictions Precautions Precautions: Fall Restrictions Weight Bearing Restrictions: No      Mobility  Bed Mobility               General bed  mobility comments: pt refused; reported that he was just up this morning with assistance from nursing;  Transfers                 General transfer comment: pt refused  Ambulation/Gait             General Gait Details: pt refused  Stairs            Wheelchair Mobility    Modified Rankin (Stroke Patients Only)       Balance Overall balance assessment: History of Falls                                           Pertinent Vitals/Pain      Home Living Family/patient expects to be discharged to:: Private residence Living Arrangements: Alone Available Help at Discharge: Friend(s);Available PRN/intermittently Type of Home: House Home Access: Level entry     Home Layout: One level Home Equipment: Walker - 2 wheels;Walker - 4 wheels;Wheelchair - manual;Grab bars - tub/shower;Shower seat;Grab bars - toilet Additional Comments: reports being able to walk short distances without AD    Prior Function Level of Independence: Independent with assistive device(s)               Hand Dominance        Extremity/Trunk Assessment   Upper Extremity Assessment Upper Extremity Assessment: Generalized weakness    Lower Extremity Assessment Lower Extremity Assessment: RLE deficits/detail;LLE deficits/detail RLE Deficits / Details: grossly 4/5 in supine,  intact light touch sensation; RLE Sensation: WNL RLE Coordination: WNL LLE Deficits / Details: grossly 4/5 in supine, intact light touch sensation; LLE Sensation: WNL LLE Coordination: WNL       Communication   Communication: No difficulties  Cognition Arousal/Alertness: Awake/alert Behavior During Therapy: WFL for tasks assessed/performed Overall Cognitive Status: Within Functional Limits for tasks assessed                                 General Comments: oriented to place, date, situation and person      General Comments General comments (skin integrity, edema,  etc.): good skin integrity, no edema in BLE    Exercises     Assessment/Plan    PT Assessment Patient needs continued PT services  PT Problem List Decreased strength;Decreased mobility;Decreased activity tolerance;Decreased balance;Cardiopulmonary status limiting activity       PT Treatment Interventions Gait training;Functional mobility training;Therapeutic activities;Therapeutic exercise;Balance training;Neuromuscular re-education;Patient/family education    PT Goals (Current goals can be found in the Care Plan section)  Acute Rehab PT Goals Patient Stated Goal: "to get better" PT Goal Formulation: With patient Time For Goal Achievement: 06/03/19 Potential to Achieve Goals: Fair    Frequency Min 2X/week   Barriers to discharge Decreased caregiver support      Co-evaluation               AM-PAC PT "6 Clicks" Mobility  Outcome Measure Help needed turning from your back to your side while in a flat bed without using bedrails?: None Help needed moving from lying on your back to sitting on the side of a flat bed without using bedrails?: A Little Help needed moving to and from a bed to a chair (including a wheelchair)?: A Little Help needed standing up from a chair using your arms (e.g., wheelchair or bedside chair)?: A Little Help needed to walk in hospital room?: A Little Help needed climbing 3-5 steps with a railing? : A Lot 6 Click Score: 18    End of Session   Activity Tolerance: Patient limited by fatigue Patient left: in bed;with call bell/phone within reach;with bed alarm set Nurse Communication: Mobility status PT Visit Diagnosis: Difficulty in walking, not elsewhere classified (R26.2);Muscle weakness (generalized) (M62.81)    Time: 1011-1020 PT Time Calculation (min) (ACUTE ONLY): 9 min   Charges:   PT Evaluation $PT Eval Low Complexity: 1 Low            Immanuel Fedak PT, DPT 05/11/2019, 12:28 PM

## 2019-05-21 ENCOUNTER — Inpatient Hospital Stay: Payer: Medicare Other

## 2019-05-21 LAB — BASIC METABOLIC PANEL
Anion gap: 9 (ref 5–15)
BUN: 8 mg/dL (ref 8–23)
CO2: 22 mmol/L (ref 22–32)
Calcium: 9.9 mg/dL (ref 8.9–10.3)
Chloride: 101 mmol/L (ref 98–111)
Creatinine, Ser: 0.99 mg/dL (ref 0.61–1.24)
GFR calc Af Amer: >60 mL/min (ref >60)
GFR calc non Af Amer: >60 mL/min (ref >60)
Glucose, Bld: 126 mg/dL — ABNORMAL HIGH (ref 70–99)
Potassium: 4.3 mmol/L (ref 3.5–5.1)
Sodium: 132 mmol/L — ABNORMAL LOW (ref 135–145)

## 2019-05-21 LAB — CBC
HCT: 36.9 % — ABNORMAL LOW (ref 39.0–52.0)
Hemoglobin: 12 g/dL — ABNORMAL LOW (ref 13.0–17.0)
MCH: 29.8 pg (ref 26.0–34.0)
MCHC: 32.5 g/dL (ref 30.0–36.0)
MCV: 91.6 fL (ref 80.0–100.0)
Platelets: 225 10*3/uL (ref 150–400)
RBC: 4.03 MIL/uL — ABNORMAL LOW (ref 4.22–5.81)
RDW: 16.5 % — ABNORMAL HIGH (ref 11.5–15.5)
WBC: 8.9 10*3/uL (ref 4.0–10.5)
nRBC: 0 % (ref 0.0–0.2)

## 2019-05-21 LAB — MAGNESIUM: Magnesium: 2.4 mg/dL (ref 1.7–2.4)

## 2019-05-21 LAB — PHOSPHORUS: Phosphorus: 4 mg/dL (ref 2.5–4.6)

## 2019-05-21 MED ORDER — FREE WATER: 30.0000 mL | Freq: Every day | Status: DC

## 2019-05-21 MED ORDER — OSMOLITE 1.5 CAL PO LIQD
120.0000 mL | Freq: Every day | ORAL | Status: DC
  Administered 2019-05-22: 120 mL

## 2019-05-21 MED ORDER — FREE WATER
120.0000 mL | Freq: Every day | Status: DC
  Administered 2019-05-22: 120 mL

## 2019-05-21 MED ORDER — HYDRALAZINE HCL 20 MG/ML IJ SOLN
10.0000 mg | INTRAMUSCULAR | Status: DC | PRN
  Filled 2019-05-21: qty 0.5

## 2019-05-21 MED ORDER — IPRATROPIUM-ALBUTEROL 0.5-2.5 (3) MG/3ML IN SOLN
3.0000 mL | Freq: Four times a day (QID) | RESPIRATORY_TRACT | Status: DC | PRN
  Administered 2019-05-21: 3 mL via RESPIRATORY_TRACT
  Filled 2019-05-21: qty 3

## 2019-05-21 MED ORDER — PRO-STAT SUGAR FREE PO LIQD: 30.0000 mL | Freq: Every day | ORAL | Status: DC

## 2019-05-21 NOTE — Progress Notes (Addendum)
NP. Stark Klein contacted via phone due to patient restless and SOB, NP. Ouma evaluated at the bedside and new orders placed.   Patient denied the need for pain medication at this time. Patient seem to be in some distress stating "I can't do this anymore". Patient confused at times. Tele HR 98, RR 25, BP elevated but patient is unable to control cough 155/105, patient refused temp to be retaken 97.2 at this time. Will continue to follow plan of care.

## 2019-05-21 NOTE — Progress Notes (Signed)
Subjective:  CC: Carlos Barrera is a 79 y.o. male  Hospital stay day 7, 1 Day Post-Op open gastrostomy tube placement  HPI: No specific issues overnight.  ROS:  General: Denies weight loss, weight gain, fatigue, fevers, chills, and night sweats. Heart: Denies chest pain, palpitations, racing heart, irregular heartbeat, leg pain or swelling, and decreased activity tolerance. Respiratory: Denies breathing difficulty, shortness of breath, wheezing, cough, and sputum. GI: Denies change in appetite, heartburn, nausea, vomiting, constipation, diarrhea, and blood in stool. GU: Denies difficulty urinating, pain with urinating, urgency, frequency, blood in urine.   Objective:   Temp:  [97.2 F (36.2 C)-98.1 F (36.7 C)] 97.5 F (36.4 C) (01/31 0426) Pulse Rate:  [74-94] 74 (01/31 0426) Resp:  [14-20] 16 (01/31 0426) BP: (149-191)/(73-109) 153/73 (01/31 0426) SpO2:  [95 %-100 %] 95 % (01/31 0426)     Height: 5\' 8"  (172.7 cm) Weight: 60.8 kg BMI (Calculated): 20.39   Intake/Output this shift:   Intake/Output Summary (Last 24 hours) at 05/21/2019 1308 Last data filed at 05/21/2019 0500 Gross per 24 hour  Intake 579.26 ml  Output 5 ml  Net 574.26 ml    Constitutional :  alert, cooperative, appears stated age and no distress  Respiratory:  clear to auscultation bilaterally  Cardiovascular:  regular rate and rhythm  Gastrointestinal: soft, non-tender; bowel sounds normal; no masses,  no organomegaly.   Skin: Cool and moist. Incision intact, tube in place. Scant  Blood at incision and stopper area.  No active bleeding, or hematoma palpable.  Psychiatric: Normal affect, non-agitated, not confused       LABS:  CMP Latest Ref Rng & Units 05/21/2019 05/15/2019 05/19/2019  Glucose 70 - 99 mg/dL 126(H) 105(H) 108(H)  BUN 8 - 23 mg/dL 8 6(L) 6(L)  Creatinine 0.61 - 1.24 mg/dL 0.99 0.92 0.83  Sodium 135 - 145 mmol/L 132(L) 133(L) 133(L)  Potassium 3.5 - 5.1 mmol/L 4.3 4.1 2.7(LL)  Chloride 98  - 111 mmol/L 101 103 102  CO2 22 - 32 mmol/L 22 22 24   Calcium 8.9 - 10.3 mg/dL 9.9 9.7 9.3  Total Protein 6.5 - 8.1 g/dL - - -  Total Bilirubin 0.3 - 1.2 mg/dL - - -  Alkaline Phos 38 - 126 U/L - - -  AST 15 - 41 U/L - - -  ALT 0 - 44 U/L - - -   CBC Latest Ref Rng & Units 05/21/2019 04/30/2019 05/19/2019  WBC 4.0 - 10.5 K/uL 8.9 5.1 4.8  Hemoglobin 13.0 - 17.0 g/dL 12.0(L) 11.6(L) 11.8(L)  Hematocrit 39.0 - 52.0 % 36.9(L) 35.4(L) 35.5(L)  Platelets 150 - 400 K/uL 225 214 232    RADS: n/a Assessment:   S/p open gtube placement.  Doing well.  Ok to start using tube later today with pushing meds and small amounts of tube feeds.  Start heparin drip likely in am if no issues.

## 2019-05-21 NOTE — Consult Note (Signed)
PHARMACY CONSULT NOTE - FOLLOW UP  Pharmacy Consult for Electrolyte Monitoring and Replacement   Recent Labs: Potassium (mmol/L)  Date Value  05/21/2019 4.3   Magnesium (mg/dL)  Date Value  05/21/2019 2.4   Calcium (mg/dL)  Date Value  05/21/2019 9.9   Albumin (g/dL)  Date Value  05/12/2019 3.6   Phosphorus (mg/dL)  Date Value  05/21/2019 4.0   Sodium (mmol/L)  Date Value  05/21/2019 132 (L)     Assessment: Patient is a 79 y/o M with medical history including atrial fibrillation on amiodarone and apixaban, COPD, HTN, HLD, agent orange exposure, lung cancer, remote prostate cancer, hypothyroidism, remote tobacco use who has been admitted to [facility] since 1/24 for dysphagia secondary to esophageal stenosis. Patient going for G tube placement 1/30.    Goal of Therapy:  K ~ 4 Mg ~2 All other electrolytes within normal limits  Plan:  No replacement at this time -Electrolytes with AM labs  Chinita Greenland PharmD Clinical Pharmacist 05/21/2019

## 2019-05-21 NOTE — Progress Notes (Signed)
PROGRESS NOTE    Carlos JEANPAUL  Barrera:[id number] DOB: [birth year] DOA: 05/18/2019  PCP: [facility]    LOS - 7   Brief Narrative:  Carlos Bacon Howlandis a 79 y.o.malewith a hx of 79 y.o.malewith a hx of atrial fibrillationon amiodarone and Eliquis, COPD, HTN, HLD, agent orange exposure, lung cancer, remote prostate cancer, hypothyroidism, remote tobacco abuse (40 pack years, quit 1982)presented with dysphagiato solids and liquids andprojectile vomiting. Barium esophagram showedbarium to be grossly retained at the midesophagus with tight, strictured appearing caliber of the esophagus at the level of the left mainstem bronchus.Subsequently taken for EGD by gastroenterology. Upper 1/3 of esophagus with retained food. Dilation was attempted unsuccessfully. Narrowing of esophagus is extrinsic Chemo port placed by IR on 1/29. IR was unable to place PEG tube due to retained esophageal contents and stenosis, unable to pass catheter through esophagus.  Surgery today for feeding tube placement.  Subjective 1/31: Patient seen this morning at bedside.  No acute events reported overnight.  He denies any pain.  Does report new onset of productive cough, but no fevers or chills.  He is unhappy to be in a bed without buttons on the side rail that he can set himself up.  He is alert but slightly confused today, asked a few nonsensical questions.  He got irritable when I did not understand what he was asking.  Nursing staff report that patient will not leave on continuous pulse ox, pulled off telemetry leads, and attempt to get out of bed frequently.    Assessment & Plan:   Active Problems:   Adenocarcinoma of right lung (HCC)   Paroxysmal atrial fibrillation (HCC)   Intractable vomiting   Pericardial effusion   Hypertension   Dysphagia   Problems with swallowing and mastication   Stricture and stenosis of esophagus   Protein-calorie malnutrition, severe   Dysphagiasecondary to  esophageal stenosis See narrative about for hospital course to date. Esophageal narrowing due to extrinsic cause,most likelycompression by tumor/adenopathy (see PET scan report of 1/19). --general surgery placed G tube 1/30 afternoon --dietician consulted for tube feed regimen --holding all PO meds --can start tube feeds this evening, per surgery   Hypophosphatemia  --Pharmacy consulted for replacement of electrolytes. --Monitor phos daily and replace as needed.   --High risk for refeeding syndrome once feeds are able to be started.  Essential Hypertension- chronic, stable without antihypertensives  Paroxysmal A-fib- rate controlled --hold POamiodarone(NPOdue to dysphagia) --if becomes uncontrolled, will need amiodarone gtt (in stepdown/ICU) --Eliquis on hold  --resume heparin gtt after procedures, timing per IR  Stage IV adenocarcinoma of the lung, recently diagnosed, widespread bony mets --followed by Dr. Grayland Ormond, consulted --chemo port placedby IR on1/29 --planning to start palliative chemo Feb 4th  Acute Kidney Injury- prerenalazotemiafrom decreased p.o. intake and projectile vomiting Improved.Continue on IV fluids since n.p.o. Avoid nephrotoxic drugs. --monitor BMP daily   DVT prophylaxis:on heparin gtt Code Status: Full Code Family Communication:none at bedside Disposition Plan:Pending further improvement. Patient continues to require hospital level care until nutrition can be resumed, and he will require very close monitoring once being fed due to high risk for refeeding syndrome.   Consultants:  Gastroenterology  Oncology  Procedures: EGD 1/26  Antimicrobials:  None    Objective: Vitals:   04/30/2019 2007 04/27/2019 2216 05/12/2019 2357 05/21/19 0426  BP: (!) 165/93 (!) 149/94 (!) 163/99 (!) 153/73  Pulse: 75 77 74 74  Resp: 16 16 17 16   Temp: (!) 97.5 F (36.4 C) (!)  97.5 F (36.4 C) 97.7 F (36.5 C) (!) 97.5 F  (36.4 C)  TempSrc:   Axillary Axillary  SpO2: 97% 97% 95% 95%  Weight:      Height:        Intake/Output Summary (Last 24 hours) at 05/21/2019 0735 Last data filed at 05/21/2019 0500 Gross per 24 hour  Intake 579.26 ml  Output 5 ml  Net 574.26 ml   Filed Weights   05/06/2019 1006 04/25/2019 0859  Weight: 67 kg 60.8 kg    Examination:  General exam: awake, alert, no acute distress, cachectic Respiratory system: Diffuse rhonchi, productive sounding cough, normal respiratory effort. Cardiovascular system: normal S1/S2, RRR, no pedal edema.   Gastrointestinal system: soft, non-distended abdomen, G-tube in place with clean dry intact dressing Central nervous system: alert and oriented x3. no gross focal neurologic deficits, normal speech Extremities: moves all, no cyanosis, normal tone Psychiatry: Irritable mood, congruent affect   Data Reviewed: I have personally reviewed following labs and imaging studies  CBC: Recent Labs  Lab 05/17/19 0201 05/18/19 0558 05/19/19 0401 05/17/2019 0444 05/21/19 0506  WBC 4.4 4.2 4.8 5.1 8.9  HGB 10.4* 10.6* 11.8* 11.6* 12.0*  HCT 32.6* 32.7* 35.5* 35.4* 36.9*  MCV 94.8 91.9 90.1 90.8 91.6  PLT 222 196 232 214 756   Basic Metabolic Panel: Recent Labs  Lab 04/24/2019 1120 05/05/2019 0425 05/19/19 0825 05/06/2019 0446 05/21/19 0506  NA 143 143 133* 133* 132*  K 4.0 3.5 2.7* 4.1 4.3  CL 106 107 102 103 101  CO2 27 26 24 22 22   GLUCOSE 97 91 108* 105* 126*  BUN 21 18 6* 6* 8  CREATININE 1.36* 1.17 0.83 0.92 0.99  CALCIUM 10.8* 10.1 9.3 9.7 9.9  MG  --   --   --  1.7 2.4  PHOS  --   --   --  1.9* 4.0   GFR: Estimated Creatinine Clearance: 52.9 mL/min (by C-G formula based on SCr of 0.99 mg/dL). Liver Function Tests: Recent Labs  Lab 05/08/2019 1120  AST 18  ALT 10  ALKPHOS 86  BILITOT 0.7  PROT 7.3  ALBUMIN 3.6   Recent Labs  Lab 05/08/2019 1120  LIPASE 29   No results for input(s): AMMONIA in the last 168 hours. Coagulation  Profile: Recent Labs  Lab 05/13/2019 1515 05/19/19 0825  INR 1.3* 1.0   Cardiac Enzymes: No results for input(s): CKTOTAL, CKMB, CKMBINDEX, TROPONINI in the last 168 hours. BNP (last 3 results) No results for input(s): PROBNP in the last 8760 hours. HbA1C: No results for input(s): HGBA1C in the last 72 hours. CBG: No results for input(s): GLUCAP in the last 168 hours. Lipid Profile: No results for input(s): CHOL, HDL, LDLCALC, TRIG, CHOLHDL, LDLDIRECT in the last 72 hours. Thyroid Function Tests: No results for input(s): TSH, T4TOTAL, FREET4, T3FREE, THYROIDAB in the last 72 hours. Anemia Panel: No results for input(s): VITAMINB12, FOLATE, FERRITIN, TIBC, IRON, RETICCTPCT in the last 72 hours. Sepsis Labs: No results for input(s): PROCALCITON, LATICACIDVEN in the last 168 hours.  Recent Results (from the past 240 hour(s))  Respiratory Panel by RT PCR (Flu A&B, Covid) - Nasopharyngeal Swab     Status: None   Collection Time: 04/25/2019 11:20 AM   Specimen: Nasopharyngeal Swab  Result Value Ref Range Status   SARS Coronavirus 2 by RT PCR NEGATIVE NEGATIVE Final    Comment: (NOTE) SARS-CoV-2 target nucleic acids are NOT DETECTED. The SARS-CoV-2 RNA is generally detectable in upper respiratoy specimens  during the acute phase of infection. The lowest concentration of SARS-CoV-2 viral copies this assay can detect is 131 copies/mL. A negative result does not preclude SARS-Cov-2 infection and should not be used as the sole basis for treatment or other patient management decisions. A negative result may occur with  improper specimen collection/handling, submission of specimen other than nasopharyngeal swab, presence of viral mutation(s) within the areas targeted by this assay, and inadequate number of viral copies (<131 copies/mL). A negative result must be combined with clinical observations, patient history, and epidemiological information. The expected result is Negative. Fact Sheet  for Patients:  PinkCheek.be Fact Sheet for Healthcare Providers:  GravelBags.it This test is not yet ap proved or cleared by the [place] FDA and  has been authorized for detection and/or diagnosis of SARS-CoV-2 by FDA under an Emergency Use Authorization (EUA). This EUA will remain  in effect (meaning this test can be used) for the duration of the COVID-19 declaration under Section 564(b)(1) of the Act, 21 U.S.C. section 360bbb-3(b)(1), unless the authorization is terminated or revoked sooner.    Influenza A by PCR NEGATIVE NEGATIVE Final   Influenza B by PCR NEGATIVE NEGATIVE Final    Comment: (NOTE) The Xpert Xpress SARS-CoV-2/FLU/RSV assay is intended as an aid in  the diagnosis of influenza from Nasopharyngeal swab specimens and  should not be used as a sole basis for treatment. Nasal washings and  aspirates are unacceptable for Xpert Xpress SARS-CoV-2/FLU/RSV  testing. Fact Sheet for Patients: PinkCheek.be Fact Sheet for Healthcare Providers: GravelBags.it This test is not yet approved or cleared by the [place] FDA and  has been authorized for detection and/or diagnosis of SARS-CoV-2 by  FDA under an Emergency Use Authorization (EUA). This EUA will remain  in effect (meaning this test can be used) for the duration of the  Covid-19 declaration under Section 564(b)(1) of the Act, 21  U.S.C. section 360bbb-3(b)(1), unless the authorization is  terminated or revoked. Performed at [facility], [address] [place], [place] [address]          Radiology Studies: IR IMAGING GUIDED PORT INSERTION  Result Date: 05/19/2019 CLINICAL DATA:  Metastatic adenocarcinoma of the right lung with central tumor and lymphadenopathy causing stricture and obstruction of the esophagus. The esophagus was unable to be successfully dilated endoscopically. The  patient requires a port for chemotherapy and also a gastrostomy tube for nutrition. EXAM: 1. IMPLANTED PORT A CATH PLACEMENT WITH ULTRASOUND AND FLUOROSCOPIC GUIDANCE 2. ABORTED ATTEMPT AT PERCUTANEOUS GASTROSTOMY TUBE PLACEMENT ANESTHESIA/SEDATION: 2.0 mg IV Versed; 100 mcg IV Fentanyl Total Moderate Sedation Time:  45 minutes The patient's level of consciousness and physiologic status were continuously monitored during the procedure by Radiology nursing. Additional Medications: 2 g IV Ancef. FLUOROSCOPY TIME:  3 minutes and 18 seconds.  58.3 mGy. PROCEDURE: The procedure, risks, benefits, and alternatives were explained to the patient. Questions regarding the procedure were encouraged and answered. The patient understands and consents to the procedure. A time-out was performed prior to initiating the procedure. Ultrasound was utilized to confirm patency of the right internal jugular vein. The right neck and chest were prepped with chlorhexidine in a sterile fashion, and a sterile drape was applied covering the operative field. Maximum barrier sterile technique with sterile gowns and gloves were used for the procedure. Local anesthesia was provided with 1% lidocaine. After creating a small venotomy incision, a 21 gauge needle was advanced into the right internal jugular vein under direct, real-time ultrasound guidance.  Ultrasound image documentation was performed. After securing guidewire access, an 8 Fr dilator was placed. A J-wire was kinked to measure appropriate catheter length. A subcutaneous port pocket was then created along the upper chest wall utilizing sharp and blunt dissection. Portable cautery was utilized. The pocket was irrigated with sterile saline. A single lumen power injectable port was chosen for placement. The 8 Fr catheter was tunneled from the port pocket site to the venotomy incision. The port was placed in the pocket. External catheter was trimmed to appropriate length based on guidewire  measurement. At the venotomy, an 8 Fr peel-away sheath was placed over a guidewire. The catheter was then placed through the sheath and the sheath removed. Final catheter positioning was confirmed and documented with a fluoroscopic spot image. The port was accessed with a needle and aspirated and flushed with heparinized saline. The access needle was removed. The venotomy and port pocket incisions were closed with subcutaneous 3-0 Monocryl and subcuticular 4-0 Vicryl. Dermabond was applied to both incisions. After port placement, a 5 [place] the catheter was advanced through the mouth under fluoroscopy and into the proximal esophagus. The catheter was further advanced to the level of the mid esophagus under fluoroscopy and a guidewire advanced into the esophageal lumen. The catheter was then advanced over the guidewire and attempt made to advanced both catheter and guidewire access distally into the lower esophagus. Ultimately, catheter and wire were removed. COMPLICATIONS: COMPLICATIONS None FINDINGS: After port placement, the catheter tip lies at the cavo-atrial junction. The catheter aspirates normally and is ready for immediate use. Percutaneous gastrostomy tube placement was aborted and unsuccessful. A 5 French catheter and guidewire could not be successfully advanced beyond a high-grade stricture and obstruction of the mid esophagus. There is retained barium and debris present within the mid esophagus above the level of esophageal obstruction. Catheter and guidewire were able to be advanced to the level of obstruction but could not be passed through the obstruction. A percutaneous gastrostomy tube can not be placed without ability to insufflate the stomach with air under fluoroscopy. IMPRESSION: 1. Placement of single lumen port a cath via right internal jugular vein. The catheter tip lies at the cavo-atrial junction. A power injectable port a cath was placed and is ready for immediate use. 2. Inability  to place percutaneous gastrostomy tube due to failure to pass a 5 French catheter through the level of high-grade mid esophageal obstruction to allow insufflation of the stomach with air. Electronically Signed   By: Aletta Edouard M.D.   On: 05/19/2019 13:13        Scheduled Meds: . Chlorhexidine Gluconate Cloth  6 each Topical Daily   Continuous Infusions: . sodium chloride 20 mL/hr at 04/29/2019 1846     LOS: 7 days    Time spent: 35 minutes    Ezekiel Slocumb, DO [facility]   If 7PM-7AM, please contact night-coverage [email or web address] 05/21/2019, 7:35 AM

## 2019-05-21 NOTE — Anesthesia Postprocedure Evaluation (Signed)
Anesthesia Post Note  Patient: Carlos Barrera  Procedure(s) Performed: INSERTION OF GASTROSTOMY TUBE (N/A Abdomen)  Patient location during evaluation: PACU Anesthesia Type: General Level of consciousness: awake and alert and oriented Pain management: pain level controlled Vital Signs Assessment: post-procedure vital signs reviewed and stable Respiratory status: spontaneous breathing Cardiovascular status: blood pressure returned to baseline Anesthetic complications: no     Last Vitals:  Vitals:   05/15/2019 2357 05/21/19 0426  BP: (!) 163/99 (!) 153/73  Pulse: 74 74  Resp: 17 16  Temp: 36.5 C (!) 36.4 C  SpO2: 95% 95%    Last Pain:  Vitals:   05/21/19 0738  TempSrc:   PainSc: 7                  Lafaye Mcelmurry

## 2019-05-22 ENCOUNTER — Encounter: Payer: Self-pay | Admitting: *Deleted

## 2019-05-22 DIAGNOSIS — Z66 Do not resuscitate: Secondary | ICD-10-CM

## 2019-05-22 DIAGNOSIS — Z515 Encounter for palliative care: Secondary | ICD-10-CM

## 2019-05-22 DIAGNOSIS — Z7189 Other specified counseling: Secondary | ICD-10-CM

## 2019-05-22 DIAGNOSIS — I313 Pericardial effusion (noninflammatory): Secondary | ICD-10-CM

## 2019-05-22 DIAGNOSIS — I4891 Unspecified atrial fibrillation: Secondary | ICD-10-CM

## 2019-05-22 DIAGNOSIS — R0600 Dyspnea, unspecified: Secondary | ICD-10-CM

## 2019-05-22 MED ORDER — LORAZEPAM 2 MG/ML IJ SOLN
0.5000 mg | Freq: Four times a day (QID) | INTRAMUSCULAR | Status: DC | PRN
  Administered 2019-05-22: 0.5 mg via INTRAVENOUS
  Filled 2019-05-22: qty 1

## 2019-05-22 MED ORDER — MORPHINE SULFATE (PF) 4 MG/ML IV SOLN
4.0000 mg | Freq: Once | INTRAVENOUS | Status: AC
  Administered 2019-05-22: 4 mg via INTRAVENOUS
  Filled 2019-05-22: qty 1

## 2019-05-22 MED ORDER — GLYCOPYRROLATE 0.2 MG/ML IJ SOLN
0.4000 mg | INTRAMUSCULAR | Status: DC
  Filled 2019-05-22 (×3): qty 2

## 2019-05-22 MED ORDER — GLYCOPYRROLATE 0.2 MG/ML IJ SOLN
0.2000 mg | INTRAMUSCULAR | Status: DC | PRN
  Administered 2019-05-22: 0.2 mg via INTRAVENOUS
  Filled 2019-05-22 (×2): qty 1

## 2019-05-22 MED ORDER — MORPHINE SULFATE (PF) 2 MG/ML IV SOLN
2.0000 mg | INTRAVENOUS | Status: DC | PRN
  Administered 2019-05-22 (×5): 2 mg via INTRAVENOUS
  Filled 2019-05-22 (×4): qty 1

## 2019-05-22 MED ORDER — SCOPOLAMINE 1 MG/3DAYS TD PT72
1.0000 | MEDICATED_PATCH | TRANSDERMAL | Status: DC
  Filled 2019-05-22: qty 1

## 2019-05-22 MED ORDER — GLYCOPYRROLATE 0.2 MG/ML IJ SOLN
0.2000 mg | INTRAMUSCULAR | Status: DC | PRN
  Filled 2019-05-22: qty 1

## 2019-05-22 MED ORDER — GLYCOPYRROLATE 0.2 MG/ML IJ SOLN
0.3000 mg | Freq: Four times a day (QID) | INTRAMUSCULAR | Status: DC
  Administered 2019-05-22: 0.3 mg via INTRAVENOUS
  Filled 2019-05-22 (×2): qty 1.5

## 2019-05-22 MED ORDER — LORAZEPAM 2 MG/ML IJ SOLN
1.0000 mg | INTRAMUSCULAR | Status: DC | PRN
  Administered 2019-05-22: 2 mg via INTRAVENOUS
  Administered 2019-05-22: 1 mg via INTRAVENOUS
  Filled 2019-05-22 (×2): qty 1

## 2019-05-22 MED ORDER — MORPHINE SULFATE (PF) 2 MG/ML IV SOLN
INTRAVENOUS | Status: AC
  Filled 2019-05-22: qty 1

## 2019-05-22 MED ORDER — MORPHINE 100MG IN NS 100ML (1MG/ML) PREMIX INFUSION
2.0000 mg/h | INTRAVENOUS | Status: DC
  Administered 2019-05-22: 2 mg/h via INTRAVENOUS
  Filled 2019-05-22: qty 100

## 2019-05-22 MED ORDER — MORPHINE BOLUS VIA INFUSION
2.0000 mg | INTRAVENOUS | Status: DC | PRN
  Filled 2019-05-22: qty 4

## 2019-05-22 MED ORDER — GLYCOPYRROLATE 1 MG PO TABS
1.0000 mg | ORAL_TABLET | ORAL | Status: DC | PRN
  Filled 2019-05-22: qty 1

## 2019-05-22 DEATH — deceased

## 2019-05-23 ENCOUNTER — Inpatient Hospital Stay: Payer: No Typology Code available for payment source

## 2019-05-23 ENCOUNTER — Inpatient Hospital Stay: Payer: Medicare Other | Admitting: Oncology

## 2019-05-23 NOTE — Addendum Note (Signed)
Addendum  created 05/23/19 0857 by Doreen Salvage, CRNA   Charge Capture section accepted

## 2019-05-25 ENCOUNTER — Inpatient Hospital Stay: Payer: No Typology Code available for payment source | Admitting: Oncology

## 2019-05-25 ENCOUNTER — Ambulatory Visit: Payer: Medicare Other

## 2019-05-25 ENCOUNTER — Other Ambulatory Visit: Payer: Medicare Other

## 2019-06-19 NOTE — Progress Notes (Signed)
POA wishes Sabino Niemann not to be allowed to visit at this time due to patient restrictions. Passed this information on to oncoming nurse.

## 2019-06-19 NOTE — Progress Notes (Signed)
OT Cancellation Note  Patient Details Name: Carlos Barrera MRN: [id number] DOB: [birth year]   Cancelled Treatment:    Reason Eval/Treat Not Completed: Other (comment). Consult received, chart reviewed. Pt noted to now be comfort care measures only. RN confirmed by phone. Will sign off for OT.   Jeni Salles, MPH, MS, OTR/L ascom [phone] 06/18/19, 8:08 AM

## 2019-06-19 NOTE — Progress Notes (Signed)
Pulmonary Individual Treatment Plan  Patient Details  Name: Carlos Barrera MRN: [id number] Date of Birth: June 10, 1940 Referring Provider:     Pulmonary Rehab from 12/07/2018 in [facility] Cardiac and Pulmonary Rehab  Referring Provider  [name]      Initial Encounter Date:    Pulmonary Rehab from 12/07/2018 in [facility] Cardiac and Pulmonary Rehab  Date  12/07/18      Visit Diagnosis: Dyspnea, unspecified type  Patient's Home Medications on Admission:  Current Outpatient Medications:  .  alum & mag hydroxide-simeth (MAALOX/MYLANTA) 200-200-20 MG/5ML suspension, Take 30 mLs by mouth every 6 (six) hours as needed for indigestion or heartburn., Disp: 355 mL, Rfl: 0 .  amiodarone (PACERONE) 200 MG tablet, Take 1 tablet (200 mg total) by mouth 2 (two) times daily., Disp: 60 tablet, Rfl: 0 .  apixaban (ELIQUIS) 5 MG TABS tablet, Take 1 tablet (5 mg total) by mouth 2 (two) times daily., Disp: 60 tablet, Rfl: 0 .  cetirizine (ZYRTEC) 10 MG tablet, Take 10 mg by mouth daily. , Disp: , Rfl:  .  folic acid (FOLVITE) 1 MG tablet, Take 1 mg by mouth daily., Disp: , Rfl:  .  folic acid (FOLVITE) 1 MG tablet, Take 1 tablet (1 mg total) by mouth daily. Start 5-7 days before Alimta chemotherapy. Continue until 21 days after Alimta completed., Disp: 100 tablet, Rfl: 3 .  levothyroxine (SYNTHROID) 88 MCG tablet, Take 88 mcg by mouth daily before breakfast., Disp: , Rfl:  .  lidocaine-prilocaine (EMLA) cream, Apply to affected area once, Disp: 30 g, Rfl: 3 .  megestrol (MEGACE) 40 MG tablet, Take 1 tablet (40 mg total) by mouth daily., Disp: 30 tablet, Rfl: 1 .  ondansetron (ZOFRAN ODT) 4 MG disintegrating tablet, Take 1 tablet (4 mg total) by mouth every 8 (eight) hours as needed for nausea or vomiting. (Patient not taking: Reported on 05/01/2019), Disp: 20 tablet, Rfl: 0 .  ondansetron (ZOFRAN) 8 MG tablet, Take 1 tablet (8 mg total) by mouth 2 (two) times daily as needed for refractory nausea / vomiting.,  Disp: 60 tablet, Rfl: 2 .  polyethylene glycol (MIRALAX / GLYCOLAX) 17 g packet, Take 17 g by mouth daily., Disp: 14 each, Rfl: 0 .  pravastatin (PRAVACHOL) 20 MG tablet, Take 20 mg by mouth at bedtime., Disp: , Rfl:  .  prazosin (MINIPRESS) 5 MG capsule, Take 10 mg by mouth at bedtime. , Disp: , Rfl:  .  prochlorperazine (COMPAZINE) 10 MG tablet, Take 1 tablet (10 mg total) by mouth every 6 (six) hours as needed (Nausea or vomiting)., Disp: 60 tablet, Rfl: 2 .  REFRESH TEARS 0.5 % SOLN, Place 1 drop into both eyes 2 (two) times daily. , Disp: , Rfl:  .  sertraline (ZOLOFT) 50 MG tablet, Take 50 mg by mouth at bedtime. , Disp: , Rfl:  .  tamsulosin (FLOMAX) 0.4 MG CAPS capsule, Take 0.4 mg by mouth daily. , Disp: , Rfl:  .  traZODone (DESYREL) 50 MG tablet, Take 50 mg by mouth at bedtime., Disp: , Rfl:   Current Facility-Administered Medications:  .  Leuprolide Acetate (6 Month) (LUPRON) injection 45 mg, 45 mg, Intramuscular, Once, Stoioff, Ronda Fairly, MD  Facility-Administered Medications Ordered in Other Visits:  .  0.9 %  sodium chloride infusion, , Intravenous, Continuous, Pickenpack-Cousar, Athena N, NP, Last Rate: 20 mL/hr at 05/21/19 1535, Rate Verify at 05/21/19 1535 .  [DISCONTINUED] glycopyrrolate (ROBINUL) tablet 1 mg, 1 mg, Oral, Q4H PRN **OR** [DISCONTINUED] glycopyrrolate (ROBINUL)  injection 0.2 mg, 0.2 mg, Subcutaneous, Q4H PRN **OR** glycopyrrolate (ROBINUL) injection 0.4 mg, 0.4 mg, Intravenous, Q4H, Pickenpack-Cousar, Athena N, NP .  ipratropium-albuterol (DUONEB) 0.5-2.5 (3) MG/3ML nebulizer solution 3 mL, 3 mL, Nebulization, Q6H PRN, Lang Snow, NP, 3 mL at 05/21/19 2312 .  LORazepam (ATIVAN) injection 1-2 mg, 1-2 mg, Intravenous, Q2H PRN, Lang Snow, NP, 2 mg at Jun 12, 2019 0759 .  morphine '100mg'$  in NS 118m ('1mg'$ /mL) infusion - premix, 2-6 mg/hr, Intravenous, Continuous, Pickenpack-Cousar, ACarlena Sax NP, Stopped at [id number] .  morphine bolus via  infusion 2-4 mg, 2-4 mg, Intravenous, PRN, Pickenpack-Cousar, Athena N, NP .  ondansetron (ZOFRAN) injection 4 mg, 4 mg, Intravenous, Q6H PRN, Sakai, Isami, DO .  scopolamine (TRANSDERM-SCOP) 1 MG/3DAYS 1.5 mg, 1 patch, Transdermal, Q72H, Pickenpack-Cousar, ACarlena Sax NP  Past Medical History: Past Medical History:  Diagnosis Date  . Agent orange exposure   . Asthma    exercise induced asthma   . COPD (chronic obstructive pulmonary disease) ([place]   . DJD (degenerative joint disease)    a. 02/2019 s/p L TKA.  .Marland KitchenGERD (gastroesophageal reflux disease)   . Hearing loss    wears hearing aids  . History of cardiac cath    a. History of 2 cardiac catheterizations, last ~ 10 yrs ago @ [facility]-->reportedly nl.  . History of stress test    a. 2020 - pharmacologic stress testing @ [facility]-->reportedly nl.  . Hyperlipidemia   . Hypertension   . Hypothyroidism   . Insomnia   . [place]    a. Dx 03/2019.  .Marland KitchenPericardial effusion    a. 03/2019 Echo: EF 60-65%, no rwma, nl RV fxn. Mildly dil LA. Mod pericardia eff w/o tamponade. Mild Ao sclerosis.  . [place] cancer ([place]   . PTSD (post-traumatic stress disorder)   . Seasonal allergies   . Thyroid disease     Tobacco Use: Social History   Tobacco Use  Smoking Status Former Smoker  . Packs/day: 2.00  . Years: 20.00  . Pack years: 40.00  . Types: Cigarettes  . Quit date: 04/20/1980  . Years since quitting: 39.1  Smokeless Tobacco Never Used    Labs: [facility] Latest Ref Rng & Units 04/15/2019   TCO2 22 - 32 mmol/L 25       Pulmonary Assessment Scores: Pulmonary Assessment Scores    Row Name 12/07/18 1316         ADL UCSD   SOB Score total  62     Rest  1     Walk  2     Stairs  5     Bath  2     Dress  2     Shop  2       CAT Score   CAT Score  29       mMRC Score   mMRC Score  3        UCSD: Self-administered rating of dyspnea associated with  activities of daily living (ADLs) 6-point scale (0 = "not at all" to 5 = "maximal or unable to do because of breathlessness")  Scoring Scores range from 0 to 120.  Minimally important difference is 5 units  CAT: CAT can identify the health impairment of COPD patients and is better correlated with disease progression.  CAT has a scoring range of zero to 40. The CAT score is classified into four groups  of low (less than 10), medium (10 - 20), high (21-30) and very high (31-40) based on the impact level of disease on health status. A CAT score over 10 suggests significant symptoms.  A worsening CAT score could be explained by an exacerbation, poor medication adherence, poor inhaler technique, or progression of COPD or comorbid conditions.  CAT MCID is 2 points  mMRC: mMRC (Modified Medical Research Council) Dyspnea Scale is used to assess the degree of baseline functional disability in patients of respiratory disease due to dyspnea. No minimal important difference is established. A decrease in score of 1 point or greater is considered a positive change.   Pulmonary Function Assessment:   Exercise Target Goals: Exercise Program Goal: Individual exercise prescription set using results from initial 6 min walk test and THRR while considering  patient's activity barriers and safety.   Exercise Prescription Goal: Initial exercise prescription builds to 30-45 minutes a day of aerobic activity, 2-3 days per week.  Home exercise guidelines will be given to patient during program as part of exercise prescription that the participant will acknowledge.  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 HR  101 bpm     Max Ex. BP  124/58     2 Minute Post BP  112/58       Interval HR   1 Minute HR  92     2 Minute HR  70 patient holding hands behind back     3 Minute HR  -- sig loss     4 Minute HR  60 see previous note - not sure HR accurate     2 Minute Post HR  78     Interval Heart Rate?  Yes       Interval Oxygen   Interval Oxygen?  Yes     Baseline Oxygen Saturation %  96 %     1 Minute Oxygen Saturation %  96 %     1 Minute Liters of Oxygen  0 L     2 Minute Oxygen Saturation %  95 %     2 Minute Liters of Oxygen  0 L     3 Minute Oxygen Saturation %  96 %     3 Minute Liters of Oxygen  0 L     4 Minute Oxygen Saturation %  95 %     4 Minute Liters of  Oxygen  0 L     2 Minute Post Oxygen Saturation %  96 %     2 Minute Post Liters of Oxygen  0 L       Oxygen Initial Assessment: Oxygen Initial Assessment - 01/10/19 1320      Home Oxygen   Home Oxygen Device  --    Sleep Oxygen Prescription  --    Home Exercise Oxygen Prescription  --    Home at Rest Exercise Oxygen Prescription  --      Program Oxygen Prescription   Program Oxygen Prescription  --      Intervention   Short Term Goals  --    Long  Term Goals  --       Oxygen Re-Evaluation: Oxygen Re-Evaluation    Row Name 01/10/19 1345 02/02/19 1042 03/02/19 1052         Program Oxygen Prescription   Program Oxygen Prescription  None  None  None       Home Oxygen   Home Oxygen Device  None  None  None     Sleep Oxygen Prescription  None  None  None     Home Exercise Oxygen Prescription  None  None  None     Home at Rest  Exercise Oxygen Prescription  None  None  None       Goals/Expected Outcomes   Short Term Goals  To learn and demonstrate proper use of respiratory medications;To learn and demonstrate proper pursed lip breathing techniques or other breathing techniques.;To learn and understand importance of maintaining oxygen saturations>88%;To learn and understand importance of monitoring SPO2 with pulse oximeter and demonstrate accurate use of the pulse oximeter.  To learn and demonstrate proper pursed lip breathing techniques or other breathing techniques.;To learn and understand importance of maintaining oxygen saturations>88%;To learn and understand importance of monitoring SPO2 with pulse oximeter and demonstrate accurate use of the pulse oximeter.  To learn and demonstrate proper pursed lip breathing techniques or other breathing techniques.;To learn and understand importance of maintaining oxygen saturations>88%;To learn and understand importance of monitoring SPO2 with pulse oximeter and demonstrate accurate use of the pulse oximeter.     Long  Term Goals  Verbalizes importance of monitoring SPO2 with pulse oximeter and return demonstration;Maintenance of O2 saturations>88%;Exhibits proper breathing techniques, such as pursed lip breathing or other method taught during program session;Compliance with respiratory medication;Demonstrates proper use of MDI's  Maintenance of O2 saturations>88%;Exhibits proper breathing techniques, such as pursed lip breathing or other method taught during program session;Verbalizes importance of monitoring SPO2 with pulse oximeter and return demonstration  Maintenance of O2 saturations>88%;Exhibits proper breathing techniques, such as pursed lip breathing or other method taught during program session;Verbalizes importance of monitoring SPO2 with pulse oximeter and return demonstration     Comments  Patient states that his respiratory medications other than Albuterol are not helping  him. Informed him to speak with his pulmonologist about possible going over his medications and seeing if he can get something to help with his breathing. He feels like nothing is working for his mantainence drugs.  He does not have a pulse oximeter to check her oxygen saturation at home. Informed him where to get one and explained why it is important to have one. Practiced PLB with patient. Patient understands why PLB is important and to use it when he is short of breath. Reviewed that oxygen saturations should be 88 percent and above. He is meeting with his doctor soon to talk  about his medications and his breathing.  Participant is practicing PLB during exercise. Encouraged to use outside of program as well. Having CT scan of lungs next Wednesday, trying to find reasons for shortness of breath and fatigue. Participant has not been able to purchase an oximeter for home. He reports it is always the same.     Goals/Expected Outcomes  Short: Speak with pulmonologist about medications. Long: take medications prescribed properly.  Short: use PLB with exertion. Long: use PLB on exertion proficiently and independently.  Short: Use PLB throughout the day at home. Long: Use PLB before becoming too short of breath and also to relax        Oxygen Discharge (Final Oxygen Re-Evaluation): Oxygen Re-Evaluation - 03/02/19 1052      Program Oxygen Prescription   Program Oxygen Prescription  None      Home Oxygen   Home Oxygen Device  None    Sleep Oxygen Prescription  None    Home Exercise Oxygen Prescription  None    Home at Rest Exercise Oxygen Prescription  None      Goals/Expected Outcomes   Short Term Goals  To learn and demonstrate proper pursed lip breathing techniques or other breathing techniques.;To learn and understand importance of maintaining oxygen saturations>88%;To learn and understand importance of monitoring SPO2 with pulse oximeter and demonstrate accurate use of the pulse oximeter.    Long   Term Goals  Maintenance of O2 saturations>88%;Exhibits proper breathing techniques, such as pursed lip breathing or other method taught during program session;Verbalizes importance of monitoring SPO2 with pulse oximeter and return demonstration    Comments  Participant is practicing PLB during exercise. Encouraged to use outside of program as well. Having CT scan of lungs next Wednesday, trying to find reasons for shortness of breath and fatigue. Participant has not been able to purchase an oximeter for home. He reports it is always the same.    Goals/Expected Outcomes  Short: Use PLB throughout the day at home. Long: Use PLB before becoming too short of breath and also to relax       Initial Exercise Prescription: Initial Exercise Prescription - 12/07/18 1300      Date of Initial Exercise RX and Referring Provider   Date  12/07/18    Referring Provider  [name]      Treadmill   MPH  1    Grade  0    Minutes  15   1 min then rest   METs  1.2      NuStep   Level  1    SPM  80    Minutes  15    METs  1.2      Arm Ergometer   Level  1    RPM  50    Minutes  15    METs  1.2      Biostep-RELP   Level  1    SPM  50    Minutes  15    METs  2      Prescription Details   Frequency (times per week)  2    Duration  Progress to 30 minutes of continuous aerobic without signs/symptoms of physical distress      Intensity   THRR 40-80% of Max Heartrate  104-130    Ratings of Perceived Exertion  11-13    Perceived Dyspnea  0-4      Progression   Progression  Continue to progress workloads to maintain intensity without signs/symptoms of  physical distress.      Resistance Training   Training Prescription  Yes    Weight  3 lb    Reps  10-15       Perform Capillary Blood Glucose checks as needed.  Exercise Prescription Changes: Exercise Prescription Changes    Row Name 12/07/18 1300 12/20/18 1500 01/06/19 0700 01/17/19 1400 01/31/19 1100     Response to Exercise    Blood Pressure (Admit)  106/48  112/68  126/56  142/64  132/74   Blood Pressure (Exercise)  124/58  128/60  130/60  128/64  156/84   Blood Pressure (Exit)  112/58  94/68  110/54  120/64  110/58   Heart Rate (Admit)  101 bpm  95 bpm  75 bpm  64 bpm  75 bpm   Heart Rate (Exercise)  92 bpm  88 bpm  86 bpm  84 bpm  98 bpm   Heart Rate (Exit)  78 bpm  80 bpm  72 bpm  72 bpm  60 bpm   Oxygen Saturation (Admit)  96 %  97 %  94 %  94 %  92 %   Oxygen Saturation (Exercise)  95 %  93 %  94 %  94 %  94 %   Oxygen Saturation (Exit)  96 %  98 %  97 %  97 %  98 %   Rating of Perceived Exertion (Exercise)  _0 Perceived Dyspnea (Exercise)  _1 Symptoms  none  none  none  none  SOB   Duration  Progress to 30 minutes of  aerobic without signs/symptoms of physical distress  Progress to 30 minutes of  aerobic without signs/symptoms of physical distress  Continue with 30 min of aerobic exercise without signs/symptoms of physical distress.  Continue with 30 min of aerobic exercise without signs/symptoms of physical distress.  Continue with 30 min of aerobic exercise without signs/symptoms of physical distress.   Intensity  --  --  --  THRR unchanged  THRR unchanged     Progression   Progression  Continue to progress workloads to maintain intensity without signs/symptoms of physical distress.  Continue to progress workloads to maintain intensity without signs/symptoms of physical distress.  Continue to progress workloads to maintain intensity without signs/symptoms of physical distress.  Continue to progress workloads to maintain intensity without signs/symptoms of physical distress.  Continue to progress workloads to maintain intensity without signs/symptoms of physical distress.   Average METs  --  --  --  2.5  2.85     Resistance Training   Training Prescription  --  Yes  Yes  Yes  Yes   Weight  --  3 lb  3 lb  3 lbs  3 lbs   Reps  --  10-15  10-15  10-15  10-15     Interval  Training   Interval Training  --  --  --  No  No     NuStep   Level  --  _2 SPM  --  80  80  --  --   Minutes  --  _3 METs  --  2  3.1  3.1  3.7     Biostep-RELP   Level  --  --  _4 SPM  --  --  50  --  --   Minutes  --  --  _0 METs  --  --  _1 Home Exercise Plan   Plans to continue exercise at  --  --  --  Home (comment) walking  Home (comment) walking   Frequency  --  --  --  Add 2 additional days to program exercise sessions.  Add 2 additional days to program exercise sessions.   Initial Home Exercises Provided  --  --  --  12/27/18  12/27/18   Row Name 02/14/19 1300 02/14/19 1400 03/01/19 1000         Response to Exercise   Blood Pressure (Admit)  132/58  --  122/60     Blood Pressure (Exercise)  142/60  --  134/54     Blood Pressure (Exit)  100/60  --  108/58     Heart Rate (Admit)  98 bpm  --  95 bpm     Heart Rate (Exercise)  98 bpm  --  96 bpm     Heart Rate (Exit)  101 bpm  --  90 bpm     Oxygen Saturation (Admit)  98 %  --  96 %     Oxygen Saturation (Exercise)  95 %  --  96 %     Oxygen Saturation (Exit)  98 %  --  97 %     Rating of Perceived Exertion (Exercise)  13  --  13     Perceived Dyspnea (Exercise)  4  --  3     Symptoms  --  --  SOB     Duration  Continue with 30 min of aerobic exercise without signs/symptoms of physical distress.  --  Continue with 30 min of aerobic exercise without signs/symptoms of physical distress.     Intensity  THRR unchanged  --  THRR unchanged       Progression   Progression  Continue to progress workloads to maintain intensity without signs/symptoms of physical distress.  --  Continue to progress workloads to maintain intensity without signs/symptoms of physical distress.     Average METs  2.7  --  2.35       Resistance Training   Training Prescription  Yes  --  Yes     Weight  3 lb  --  3 lbs     Reps  10-15  --  10-15       Interval Training   Interval Training  No  --   No       NuStep   Level  3  --  3     Minutes  30  --  15     METs  2.7  --  2.7       Biostep-RELP   Level  --  --  1     Minutes  --  --  15     METs  --  --  2       Home Exercise Plan   Plans to continue exercise at  --  Home (comment) walking  Home (comment) walking     Frequency  --  Add 2 additional days to program exercise sessions.  Add 2 additional days to program exercise sessions.     Initial Home Exercises Provided  --  12/27/18  12/27/18        Exercise Comments: Exercise Comments  Floyd Name 12/13/18 1302 12/27/18 1056         Exercise Comments  First full day of exercise!  Patient was oriented to gym and equipment including functions, settings, policies, and procedures.  Patient's individual exercise prescription and treatment plan were reviewed.  All starting workloads were established based on the results of the 6 minute walk test done at initial orientation visit.  The plan for exercise progression was also introduced and progression will be customized based on patient's performance and goals.  Reviewed home exercise with pt today.  Pt plans to walk and look into [facility] for exercise.  Reviewed THR, pulse, RPE, sign and symptoms, NTG use, and when to call 911 or MD.  Also discussed weather considerations and indoor options.  Pt voiced understanding.  Buds shoulder has been sore since last session so he skipped the Arm crank today.         Exercise Goals and Review: Exercise Goals    Row Name 12/07/18 1309             Exercise Goals   Increase Physical Activity  Yes       Intervention  Provide advice, education, support and counseling about physical activity/exercise needs.;Develop an individualized exercise prescription for aerobic and resistive training based on initial evaluation findings, risk stratification, comorbidities and participant's personal goals.       Expected Outcomes  Short Term: Attend rehab on a regular basis to increase amount of physical  activity.;Long Term: Add in home exercise to make exercise part of routine and to increase amount of physical activity.;Long Term: Exercising regularly at least 3-5 days a week.       Increase Strength and Stamina  Yes       Intervention  Provide advice, education, support and counseling about physical activity/exercise needs.;Develop an individualized exercise prescription for aerobic and resistive training based on initial evaluation findings, risk stratification, comorbidities and participant's personal goals.       Expected Outcomes  Short Term: Increase workloads from initial exercise prescription for resistance, speed, and METs.;Short Term: Perform resistance training exercises routinely during rehab and add in resistance training at home;Long Term: Improve cardiorespiratory fitness, muscular endurance and strength as measured by increased METs and functional capacity (6MWT)       Able to understand and use rate of perceived exertion (RPE) scale  Yes       Intervention  Provide education and explanation on how to use RPE scale       Expected Outcomes  Short Term: Able to use RPE daily in rehab to express subjective intensity level;Long Term:  Able to use RPE to guide intensity level when exercising independently       Able to understand and use Dyspnea scale  Yes       Intervention  Provide education and explanation on how to use Dyspnea scale       Expected Outcomes  Short Term: Able to use Dyspnea scale daily in rehab to express subjective sense of shortness of breath during exertion;Long Term: Able to use Dyspnea scale to guide intensity level when exercising independently       Knowledge and understanding of Target Heart Rate Range (THRR)  Yes       Intervention  Provide education and explanation of THRR including how the numbers were predicted and where they are located for reference       Expected Outcomes  Short Term: Able to state/look up THRR;Short Term: Able to use daily  as guideline for  intensity in rehab;Long Term: Able to use THRR to govern intensity when exercising independently       Able to check pulse independently  Yes       Intervention  Provide education and demonstration on how to check pulse in carotid and radial arteries.;Review the importance of being able to check your own pulse for safety during independent exercise       Expected Outcomes  Short Term: Able to explain why pulse checking is important during independent exercise;Long Term: Able to check pulse independently and accurately       Understanding of Exercise Prescription  Yes       Intervention  Provide education, explanation, and written materials on patient's individual exercise prescription       Expected Outcomes  Short Term: Able to explain program exercise prescription;Long Term: Able to explain home exercise prescription to exercise independently          Exercise Goals Re-Evaluation : Exercise Goals Re-Evaluation    Row Name 12/13/18 1303 12/20/18 1545 12/27/18 1056 01/06/19 0736 01/17/19 1410     Exercise Goal Re-Evaluation   Exercise Goals Review  Increase Physical Activity;Increase Strength and Stamina;Able to understand and use rate of perceived exertion (RPE) scale;Able to understand and use Dyspnea scale;Knowledge and understanding of Target Heart Rate Range (THRR);Able to check pulse independently;Understanding of Exercise Prescription  Increase Physical Activity;Increase Strength and Stamina;Able to understand and use rate of perceived exertion (RPE) scale;Able to understand and use Dyspnea scale;Knowledge and understanding of Target Heart Rate Range (THRR);Able to check pulse independently;Understanding of Exercise Prescription  Increase Physical Activity;Increase Strength and Stamina;Able to understand and use rate of perceived exertion (RPE) scale;Knowledge and understanding of Target Heart Rate Range (THRR);Able to check pulse independently;Understanding of Exercise Prescription  Increase  Physical Activity;Able to understand and use rate of perceived exertion (RPE) scale;Increase Strength and Stamina;Able to understand and use Dyspnea scale;Knowledge and understanding of Target Heart Rate Range (THRR);Able to check pulse independently;Understanding of Exercise Prescription  Increase Physical Activity;Increase Strength and Stamina;Understanding of Exercise Prescription   Comments  Reviewed RPE scale, THR and program prescription with pt today.  Pt voiced understanding and was given a copy of goals to take home.  Buds knee gives him a lot of trouble with exercise and he only uses one leg a lot of times.  he has progressed workloads and is reporting less SOB with exercise.  he started with working for 1-2 minutes and resting and is building up from there.  Reviewed home exercise with pt today.  Pt plans to walk and look into [facility] for exercise.  Reviewed THR, pulse, RPE, sign and symptoms, NTG use, and when to call 911 or MD.  Also discussed weather considerations and indoor options.  Pt voiced understanding.  [name] attends consistently and works in correct RPE range.  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  This EP discussed a PT consult for his shoulder as he has noticed it hurts other times at home.  He can do 30 min on NS and Bio without rest.  [name] has been doing well in rehab.  He is up to level 3 on the NuStep!  We will continue to monitor his progress.   Expected Outcomes  Short: Use RPE daily to regulate intensity. Long: Follow program prescription in THR.  Short - complete 15 min without resting Long - complete 30 min without rest  --  Short - continue to attend consistently Long - increase overal MET level  Short: Increase workload on BioStep.  Long: Continue to improve stamina.   [place] Name 01/31/19 1132 02/02/19 1053 02/14/19 1401 03/01/19 0954 03/02/19 1033     Exercise Goal Re-Evaluation   Exercise Goals Review  Increase Physical  Activity;Increase Strength and Stamina;Understanding of Exercise Prescription  Increase Strength and Stamina;Increase Physical Activity  Increase Physical Activity;Increase Strength and Stamina;Able to understand and use rate of perceived exertion (RPE) scale;Able to understand and use Dyspnea scale;Knowledge and understanding of Target Heart Rate Range (THRR);Able to check pulse independently;Understanding of Exercise Prescription  Increase Physical Activity;Increase Strength and Stamina;Understanding of Exercise Prescription  Increase Physical Activity;Increase Strength and Stamina;Understanding of Exercise Prescription   Comments  [name] continues to do well in rehab.  He should be ready to increase workloads again.  When last here, he was having a few bad breathing days.  We will continue to monitor his progress.  Patient states that he cannot do more exercise than what he is doing here. Informed him that we will talk about home exercise after he talkes to his doctor about his medications.  [name] has stated he does better with the arm work by just doing ROM due to shoulder issues.  He is able to do 30 min on T4 without stopping  [name] continues to do okay in rehab. He is having issues with SOB which limits his abilty to progress.  We will continue to monitor his progress.  [name] is doing arm exercises at home on his days off. He is choosing to do arm exercises bc he is unstable on his feet. [name] lives alone. This EP encouraged that doing fly exercises and pulling exercises will help him to strengthen 'breathing' muscles and demonstrated some of those for him to use at home.Participant verbalized understanding.   Expected Outcomes  Short: Increase workloads.  Long: Continue to improve stamina.  Short: speak with physician about respiratory medications. Long: create a home exercise plan for patient.  Short - continue to attend consistently Long - increase overal stamina  Short: Move up BioStep.  Long: Continue to improve  stamina.  Short: Add fly and pulling exercises to home routine. Long: Continue to add home exercise as strength improves as he can handle more.   [place] Name 03/29/19 1333 05/17/19 1227           Exercise Goal Re-Evaluation   Comments  Out since last review.  Out since last review.         Discharge Exercise Prescription (Final Exercise Prescription Changes): Exercise Prescription Changes - 03/01/19 1000      Response to Exercise   Blood Pressure (Admit)  122/60    Blood Pressure (Exercise)  134/54    Blood Pressure (Exit)  108/58    Heart Rate (Admit)  95 bpm    Heart Rate (Exercise)  96 bpm    Heart Rate (Exit)  90 bpm    Oxygen Saturation (Admit)  96 %    Oxygen Saturation (Exercise)  96 %    Oxygen Saturation (Exit)  97 %    Rating of Perceived Exertion (Exercise)  13    Perceived Dyspnea (Exercise)  3    Symptoms  SOB    Duration  Continue with 30 min of aerobic exercise without signs/symptoms of physical distress.    Intensity  THRR unchanged      Progression   Progression  Continue to progress workloads to maintain intensity  without signs/symptoms of physical distress.    Average METs  2.35      Resistance Training   Training Prescription  Yes    Weight  3 lbs    Reps  10-15      Interval Training   Interval Training  No      NuStep   Level  3    Minutes  15    METs  2.7      Biostep-RELP   Level  1    Minutes  15    METs  2      Home Exercise Plan   Plans to continue exercise at  Home (comment)   walking   Frequency  Add 2 additional days to program exercise sessions.    Initial Home Exercises Provided  12/27/18       Nutrition:  Target Goals: Understanding of nutrition guidelines, daily intake of sodium '1500mg'$ , cholesterol '200mg'$ , calories 30% from fat and 7% or less from saturated fats, daily to have 5 or more servings of fruits and vegetables.  Biometrics: Pre Biometrics - 12/07/18 1310      Pre Biometrics   Height  5' 7.75" (1.721 m)     Weight  178 lb 12.8 oz (81.1 kg)    BMI (Calculated)  27.38        Nutrition Therapy Plan and Nutrition Goals: Nutrition Therapy & Goals - 12/07/18 1236      Nutrition Therapy   Diet  low Na, HH diet    Protein (specify units)  65-70g    Fiber  30 grams    Whole Grain Foods  3 servings    Saturated Fats  12 max. grams    Fruits and Vegetables  5 servings/day    Sodium  1.5 grams      Personal Nutrition Goals   Nutrition Goal  ST: add protein foods to B (when he has it) and protein to snack after lunch LT: lose 5 lbs and limit SOB    Comments  Pt reports eating breakfast sometimes (OJ, muffin, sometimes yogurt). Takes out [employer] for lunch with veggies and salad and meat - sometimes potatoes. Pt will have small snack of fruit with whipped cream. Discussed maybe adding peanut butter or yogurt to add protein. Disucssed HH, low Na eating and higher needs. Pt reports having a balanced relationship with food.      Intervention Plan   Intervention  Prescribe, educate and counsel regarding individualized specific dietary modifications aiming towards targeted core components such as weight, hypertension, lipid management, diabetes, heart failure and other comorbidities.;Nutrition handout(s) given to patient.    Expected Outcomes  Short Term Goal: Understand basic principles of dietary content, such as calories, fat, sodium, cholesterol and nutrients.;Short Term Goal: A plan has been developed with personal nutrition goals set during dietitian appointment.;Long Term Goal: Adherence to prescribed nutrition plan.       Nutrition Assessments:   Nutrition Goals Re-Evaluation: Nutrition Goals Re-Evaluation    [place] Name 01/10/19 1059 02/21/19 1139           Goals   Nutrition Goal  ST: add protein foods to B (when he has it) and protein to snack after lunch LT: lose 5 lbs and limit SOB  ST: add protein foods to B (when he has it) and protein to snack after lunch LT: lose 5 lbs and limit  SOB      Comment  Pt reports feeling like walking is hard with pain and  strength - mentioned how protein can help him build muslce so that he can increase his strength and mobility. Pt now consistently having breakfast " grain mix".  Continue with current changes      Expected Outcome  ST: add protein foods to B (when he has it) and protein to snack after lunch LT: lose 5 lbs and limit SOB  ST: add protein foods to B (when he has it) and protein to snack after lunch LT: lose 5 lbs and limit SOB         Nutrition Goals Discharge (Final Nutrition Goals Re-Evaluation): Nutrition Goals Re-Evaluation - 02/21/19 1139      Goals   Nutrition Goal  ST: add protein foods to B (when he has it) and protein to snack after lunch LT: lose 5 lbs and limit SOB    Comment  Continue with current changes    Expected Outcome  ST: add protein foods to B (when he has it) and protein to snack after lunch LT: lose 5 lbs and limit SOB       Psychosocial: Target Goals: Acknowledge presence or absence of significant depression and/or stress, maximize coping skills, provide positive support system. Participant is able to verbalize types and ability to use techniques and skills needed for reducing stress and depression.   Initial Review & Psychosocial Screening: Initial Psych Review & Screening - 12/06/18 1117      Initial Review   Current issues with  Current Stress Concerns;Current Anxiety/Panic    Source of Stress Concerns  Poor Coping Skills;Chronic Illness    Comments  PTSD on Zoloft, not currently seeing counselor, does not like being approached from behind, unexpected noises      [place]?  Yes    Comments  Church family, good close neighbors, son lives nearby      Barriers   Psychosocial barriers to participate in program  The patient should benefit from training in stress management and relaxation.;Psychosocial barriers identified (see note)      Screening Interventions    Interventions  Encouraged to exercise;Program counselor consult;To provide support and resources with identified psychosocial needs;Provide feedback about the scores to participant    Expected Outcomes  Short Term goal: Utilizing psychosocial counselor, staff and physician to assist with identification of specific Stressors or current issues interfering with healing process. Setting desired goal for each stressor or current issue identified.;Long Term Goal: Stressors or current issues are controlled or eliminated.;Short Term goal: Identification and review with participant of any Quality of Life or Depression concerns found by scoring the questionnaire.;Long Term goal: The participant improves quality of Life and PHQ9 Scores as seen by post scores and/or verbalization of changes       Quality of Life Scores:  Scores of 19 and below usually indicate a poorer quality of life in these areas.  A difference of  2-3 points is a clinically meaningful difference.  A difference of 2-3 points in the total score of the Quality of Life Index has been associated with significant improvement in overall quality of life, self-image, physical symptoms, and general health in studies assessing change in quality of life.  PHQ-9: Recent Review Flowsheet Data    Depression screen [facility] 2/9 01/31/2019 01/03/2019 12/07/2018   Decreased Interest 1 0 0   Down, Depressed, Hopeless 0 0 0   PHQ - 2 Score 1 0 0   Altered sleeping _0 Tired, decreased energy 1 1 2  Change in appetite - 0 1   Feeling bad or failure about yourself  0 0 0   Trouble concentrating 0 2 0   Moving slowly or fidgety/restless 0 0 3   Suicidal thoughts 3 0 0   PHQ-9 Score _0 Difficult doing work/chores Somewhat difficult Not difficult at all Somewhat difficult     Interpretation of Total Score  Total Score Depression Severity:  1-4 = Minimal depression, 5-9 = Mild depression, 10-14 = Moderate depression, 15-19 = Moderately severe  depression, 20-27 = Severe depression   Psychosocial Evaluation and Intervention:   Psychosocial Re-Evaluation: Psychosocial Re-Evaluation    [place] Name 01/03/19 1116 01/12/19 1040 02/02/19 1035 03/02/19 1101       Psychosocial Re-Evaluation   Current issues with  --  Current Stress Concerns;Current Anxiety/Panic  History of Depression;Current Stress Concerns;Current Anxiety/Panic  History of Depression;Current Stress Concerns;Current Anxiety/Panic    Comments  PHQ 9 repeated today  Score was 5  He states that his PTSD is under controll. Exercise is helping him somewhat to keep his stress down. He has a positive attitude in class and is willing to stick it out until the end.  Reviewed patient health questionnaire (PHQ-9) with patient for follow up. Previously, patients score indicated signs/symptoms of depression. Reviewed to see if patient is improving symptom wise while in program. Score declined and patient states that it is because he has been more short of breath lately.  Being up is very stressful, feels like an invalid. [name] is being very proactive in his healthcare and continue asking the [place] for answers. Praised him for doing the right thing to find asnwers. Feels very bothered by the fact that he could do anything he wanted at the beginning of the year and now can't do anything. He has friends, pastor, etc. that he can reach out to if he needs help. He takes meds for PTSD and feels that he good about that medication at this time.    Expected Outcomes  --  Short:continue to attend [facility]. Long: maintain exercise to keep stress at a minimum.  Short: Continue to work toward an improvement in [place] scores by attending [facility] regularly. Long: Continue to improve stress and depression coping skills by talking with staff and attending [facility] regularly and work toward a positive mental state.  Short: Continue to work on getting health questions and concerns answered. Long: Continue to get out of  the house and exercise as a stress relief and visit with friends virtually if possible.    Interventions  --  Encouraged to attend [facility] for the exercise  Encouraged to attend [facility] for the exercise  --    Continue Psychosocial Services   --  Follow up required by staff  Follow up required by staff  --       Psychosocial Discharge (Final Psychosocial Re-Evaluation): Psychosocial Re-Evaluation - 03/02/19 1101      Psychosocial Re-Evaluation   Current issues with  History of Depression;Current Stress Concerns;Current Anxiety/Panic    Comments  Being up is very stressful, feels like an invalid. [name] is being very proactive in his healthcare and continue asking the [place] for answers. Praised him for doing the right thing to find asnwers. Feels very bothered by the fact that he could do anything he wanted at the beginning of the year and now can't do anything. He has friends, pastor, etc. that he can reach out to if he needs help. He takes  meds for PTSD and feels that he good about that medication at this time.    Expected Outcomes  Short: Continue to work on getting health questions and concerns answered. Long: Continue to get out of the house and exercise as a stress relief and visit with friends virtually if possible.       Education: Education Goals: Education classes will be provided on a weekly basis, covering required topics. Participant will state understanding/return demonstration of topics presented.  Learning Barriers/Preferences: Learning Barriers/Preferences - 12/06/18 1115      Learning Barriers/Preferences   Learning Barriers  Sight;Hearing;Exercise Concerns   wears glasses and hearing aid, wears knee brace   Learning Preferences  Individual Instruction;Written Material       Education Topics:  Initial Evaluation Education: - Verbal, written and demonstration of respiratory meds, oximetry and breathing techniques. Instruction on use of  nebulizers and MDIs and importance of monitoring MDI activations.   General Nutrition Guidelines/Fats and Fiber: -Group instruction provided by verbal, written material, models and posters to present the general guidelines for heart healthy nutrition. Gives an explanation and review of dietary fats and fiber.   Controlling Sodium/Reading Food Labels: -Group verbal and written material supporting the discussion of sodium use in heart healthy nutrition. Review and explanation with models, verbal and written materials for utilization of the food label.   Exercise Physiology & General Exercise Guidelines: - Group verbal and written instruction with models to review the exercise physiology of the cardiovascular system and associated critical values. Provides general exercise guidelines with specific guidelines to those with heart or lung disease.    Aerobic Exercise & Resistance Training: - Gives group verbal and written instruction on the various components of exercise. Focuses on aerobic and resistive training programs and the benefits of this training and how to safely progress through these programs.   Pulmonary Rehab from 02/23/2019 in [facility] Cardiac and Pulmonary Rehab  Date  02/23/19  Educator  jh  Instruction Review Code  1- Verbalizes Understanding      Flexibility, Balance, Mind/Body Relaxation: Provides group verbal/written instruction on the benefits of flexibility and balance training, including mind/body exercise modes such as yoga, pilates and tai chi.  Demonstration and skill practice provided.   Stress and Anxiety: - Provides group verbal and written instruction about the health risks of elevated stress and causes of high stress.  Discuss the correlation between heart/lung disease and anxiety and treatment options. Review healthy ways to manage with stress and anxiety.   Depression: - Provides group verbal and written instruction on the correlation between heart/lung disease  and depressed mood, treatment options, and the stigmas associated with seeking treatment.   Exercise & Equipment Safety: - Individual verbal instruction and demonstration of equipment use and safety with use of the equipment.   Pulmonary Rehab from 12/07/2018 in [facility] Cardiac and Pulmonary Rehab  Date  12/07/18  Educator  AS  Instruction Review Code  1- Verbalizes Understanding      Infection Prevention: - Provides verbal and written material to individual with discussion of infection control including proper hand washing and proper equipment cleaning during exercise session.   Pulmonary Rehab from 12/07/2018 in [facility] Cardiac and Pulmonary Rehab  Date  12/07/18  Educator  AS  Instruction Review Code  1- Verbalizes Understanding      Falls Prevention: - Provides verbal and written material to individual with discussion of falls prevention and safety.   Pulmonary Rehab from 12/07/2018 in [facility] Cardiac and Pulmonary Rehab  Date  12/07/18  Educator  AS  Instruction Review Code  1- Verbalizes Understanding      Diabetes: - Individual verbal and written instruction to review signs/symptoms of diabetes, desired ranges of glucose level fasting, after meals and with exercise. Advice that pre and post exercise glucose checks will be done for 3 sessions at entry of program.   Chronic Lung Diseases: - Group verbal and written instruction to review updates, respiratory medications, advancements in procedures and treatments. Discuss use of supplemental oxygen including available portable oxygen systems, continuous and intermittent flow rates, concentrators, personal use and safety guidelines. Review proper use of inhaler and spacers. Provide informative websites for self-education.    Energy Conservation: - Provide group verbal and written instruction for methods to conserve energy, plan and organize activities. Instruct on pacing techniques, use of adaptive equipment and posture/positioning to  relieve shortness of breath.   Triggers and Exacerbations: - Group verbal and written instruction to review types of environmental triggers and ways to prevent exacerbations. Discuss weather changes, air quality and the benefits of nasal washing. Review warning signs and symptoms to help prevent infections. Discuss techniques for effective airway clearance, coughing, and vibrations.   AED/CPR: - Group verbal and written instruction with the use of models to demonstrate the basic use of the AED with the basic ABC's of resuscitation.   Anatomy and Physiology of the Lungs: - Group verbal and written instruction with the use of models to provide basic lung anatomy and physiology related to function, structure and complications of lung disease.   Anatomy & Physiology of the Heart: - Group verbal and written instruction and models provide basic cardiac anatomy and physiology, with the coronary electrical and arterial systems. Review of Valvular disease and Heart Failure   Cardiac Medications: - Group verbal and written instruction to review commonly prescribed medications for heart disease. Reviews the medication, class of the drug, and side effects.   Know Your Numbers and Risk Factors: -Group verbal and written instruction about important numbers in your health.  Discussion of what are risk factors and how they play a role in the disease process.  Review of Cholesterol, Blood Pressure, Diabetes, and BMI and the role they play in your overall health.   Pulmonary Rehab from 02/09/2019 in [facility] Cardiac and Pulmonary Rehab  Date  02/09/19  Educator  [facility]  Instruction Review Code  1- Verbalizes Understanding      Sleep Hygiene: -Provides group verbal and written instruction about how sleep can affect your health.  Define sleep hygiene, discuss sleep cycles and impact of sleep habits. Review good sleep hygiene tips.    Other: -Provides group and verbal instruction on various topics (see  comments)    Knowledge Questionnaire Score: Knowledge Questionnaire Score - 12/07/18 1322      Knowledge Questionnaire Score   Pre Score  14/18        Core Components/Risk Factors/Patient Goals at Admission: Personal Goals and Risk Factors at Admission - 12/07/18 1311      Core Components/Risk Factors/Patient Goals on Admission    Weight Management  Yes;Weight Maintenance    Intervention  Weight Management: Develop a combined nutrition and exercise program designed to reach desired caloric intake, while maintaining appropriate intake of nutrient and fiber, sodium and fats, and appropriate energy expenditure required for the weight goal.;Weight Management: Provide education and appropriate resources to help participant work on and attain dietary goals.    Admit Weight  178 lb 12.8 oz (81.1 kg)  Goal Weight: Short Term  168 lb (76.2 kg)    Goal Weight: Long Term  158 lb (71.7 kg)    Expected Outcomes  Short Term: Continue to assess and modify interventions until short term weight is achieved;Long Term: Adherence to nutrition and physical activity/exercise program aimed toward attainment of established weight goal    Intervention  Provide education on lifestyle modifcations including regular physical activity/exercise, weight management, moderate sodium restriction and increased consumption of fresh fruit, vegetables, and low fat dairy, alcohol moderation, and smoking cessation.;Monitor prescription use compliance.    Expected Outcomes  Short Term: Continued assessment and intervention until BP is < 140/9m HG in hypertensive participants. < 130/865mHG in hypertensive participants with diabetes, heart failure or chronic kidney disease.;Long Term: Maintenance of blood pressure at goal levels.    Intervention  Provide education and support for participant on nutrition & aerobic/resistive exercise along with prescribed medications to achieve LDL <7078mHDL >85m93m  Expected Outcomes  Short  Term: Participant states understanding of desired cholesterol values and is compliant with medications prescribed. Participant is following exercise prescription and nutrition guidelines.;Long Term: Cholesterol controlled with medications as prescribed, with individualized exercise RX and with personalized nutrition plan. Value goals: LDL < 70mg11mL > 40 mg.       Core Components/Risk Factors/Patient Goals Review:  Goals and Risk Factor Review    Row Name 01/12/19 1043 02/02/19 1048 02/07/19 1036 03/02/19 1108       Core Components/Risk Factors/Patient Goals Review   Personal Goals Review  Weight Management/Obesity;Hypertension;Lipids  Weight Management/Obesity;Lipids;Improve shortness of breath with ADL's;Hypertension  Improve shortness of breath with ADL's;Weight Management/Obesity;Hypertension;Lipids  Improve shortness of breath with ADL's;Weight Management/Obesity;Hypertension;Lipids    Review  Patient wasnt to lose a few pounds. He wants to lose around 10 pounds. His goal is to reach 170 pounds. He does not check his blood pressure at home and he has a machine. informed him to check his blood pressure at home when he wakes up. He does noit have a pulse oximeter and was informed to get one. He states he can get one through his insurance.  Patient states that he is getting more short of breath and is seeing his doctor to figure out if he can get respiratory medications. He has been having more trouble with his shortness of breath lately and has not been able to do the things that he wants to. He was going to go to church and could not shower or go due to him being short of breath.  [name] sees his pulmonologist tomorrow - he is taking meds as directed but still gets very short of breath.  he has done well with exercise and has seen imporvement there.  [name] is taking his medications as prescribed. He is happy with his current weight but does not want to gain any. [name] wants to ask Dr if he can take less  meds for BP as he thinks that may contribute to some of his fatigue.    Expected Outcomes  Short: check blood pressure at home daily. Obtain a pulse oximeter. Long: maintain blood pressure and oxygen reading at home independently.  Short: Attend [facility] regularly to improve shortness of breath with ADL's. Long: maintain independence with ADL's  Short - see Dr tomorrow  Long - improve shortness of breath with ADLs  Short: take meds as presscribed and go to Drs appts Long- Improve energy level and shortness of breath.       Core  Components/Risk Factors/Patient Goals at Discharge (Final Review):  Goals and Risk Factor Review - 03/02/19 1108      Core Components/Risk Factors/Patient Goals Review   Personal Goals Review  Improve shortness of breath with ADL's;Weight Management/Obesity;Hypertension;Lipids    Review  [name] is taking his medications as prescribed. He is happy with his current weight but does not want to gain any. [name] wants to ask Dr if he can take less meds for BP as he thinks that may contribute to some of his fatigue.    Expected Outcomes  Short: take meds as presscribed and go to Drs appts Long- Improve energy level and shortness of breath.       ITP Comments: ITP Comments    Row Name 12/06/18 1134 12/07/18 1259 12/28/18 0611 01/10/19 1316 01/25/19 1335   ITP Comments  Virtual Orientation completed.  Documentation can be found in [place] paperwork linked to encounter in media section.  Original referall was faxed on 11/16/18.  Completed initial 6MWT and nutrition eval.  Initial ITP created and sent to review to Dr Sabra Heck  30 Day review. Continue with ITP unless directed changes per Medical Director review.  Patient states that he was not sure about an event that happened at home. He states that he had a muscle soreness or burn in his chest. He states that it was not painful, or heartburn but something he realized.Informed him to make note if it happens again write down what he was doing  before hand.  30 day review completed. ITP sent to Dr. Emily Filbert, Medical Director of Cardiac and Pulmonary Rehab. Continue with ITP unless changes are made by physician.  Department closed starting 10/2 until further notice by infection prevention and Health at Work teams for [place].   [place] Name 02/22/19 0634 03/02/19 1113 03/21/19 1332 03/22/19 1011 03/29/19 1332   ITP Comments  30 day review completed. Continue with ITP sent to Dr. Emily Filbert, Medical Director of Cardiac and Pulmonary Rehab for review , changes as needed and signature.  Patient having knee replacement next Friday and expects to be back as soon as possible after a quick recovery. Discussed patient needs of a shower chair, grab rails, someone to take and drive him home, to stay with him after surgery, to prepare meals, and help him with medications.  Called to check on pt.  Had TKR on 03/09/19 and sent SNF.  Left message.  30 day review competed . ITP sent to Dr Emily Filbert for review, changes as needed and ITP approval signature.  [name] had a knee replacement surgery.  He continues to be out recieving HHPT.  His next follow up is in 4 weeks.   [place] Name 04/11/19 1442 04/19/19 0847 05/17/19 1226       ITP Comments  [name] was recently diagnosed with lung cancer.  He sees the oncologist this week and will call us with plan.  30 day review competed . ITP sent to Dr Emily Filbert for review, changes as needed and ITP approval signature  Currently in hospital  30 day review completed. ITP sent to Dr. Emily Filbert, Medical Director of Cardiac and Pulmonary Rehab. Continue with ITP unless changes are made by physician.  Department operating under reduced schedule until further notice by request from hospital leadership. Pt out on medical hold, currently admitted for GI.        Comments: Discharge ITP, patient deceased

## 2019-06-19 NOTE — Progress Notes (Signed)
    BRIEF OVERNIGHT PROGRESS REPORT   SUBJECTIVE: Notified by patient's primary RN of increased SOB, restlessness and mild distress.  OBJECTIVE: Patient assessed at the bedside, he was afebrile with blood pressure 155/105 mm Hg and pulse rate 95 beats/min. There were no focal neurological deficits; he was restless and somewhat with gargling respiration. Maintaining oxygen saturation in the low 90s.  ASSESSMENT: 79 y.o.malewith a hx of 79 y.o.malewith a hx of atrial fibrillationon amiodarone and Eliquis, COPD, HTN, HLD, agent orange exposure, lung cancer, remote prostate cancer, hypothyroidism, remote tobacco abuse (40 pack years, quit 1982)presented with dysphagiato solids and liquids andprojectile vomiting  PLAN: Given multiple co-morbidities and patient's overall declining health, I spoke with patient's son who was at the bedside and patient's preacher via phone regarding goals of care. Per patient's son and preacher who are both his POA, patient has a living will on file indicating that he would not wish to prolong his life should he have terminal illness or worsening health conditions. After prolonged discussion with POA, patient was made a DNR and subsequently transitioned to comfort care per his and POAs wishes.    Rufina Falco, DNP, CCRN, FNP-C [facility] Nurse Practitioner Between 7pm to 7am - Pager [phone]  After 7am go to [email or web address] - [email or web address]  [facility] [employer]  [phone]

## 2019-06-19 NOTE — Progress Notes (Addendum)
NP Ouma contacted via phone due to patients continued restlessness and SOB, NP Ouma evaluated at bedside and new orders place. Will follow new plan of care. Patient comfortable at this time after medications given. Son at bedside.

## 2019-06-19 NOTE — Progress Notes (Signed)
Patient pronounced deceased at 76. Family at bedside. MD and [facility] notified.

## 2019-06-19 NOTE — Death Summary Note (Signed)
Death Summary  BREVYN RING GTX:[id number] DOB: June 28, 1940 DOA: June 07, 2019  PCP: [facility] PCP/Office notified: yes  Admit date: 06-07-2019 Date of Death: 2019/06/15  Final Diagnoses:  Active Problems:   Adenocarcinoma of right lung (HCC)   Paroxysmal atrial fibrillation (HCC)   Intractable vomiting   Pericardial effusion   Hypertension   Dysphagia   Problems with swallowing and mastication   Stricture and stenosis of esophagus   Protein-calorie malnutrition, severe   1. Acute Respiratory Failure secondary to stage IV adenocarcinoma of the lung   History of present illness:  Kani Chauvin Howlandis a 79 y.o.malewith a hx of 79 y.o.malewith a hx of atrial fibrillationon amiodarone and Eliquis, COPD, HTN, HLD, agent orange exposure, lung cancer, remote prostate cancer, hypothyroidism, remote tobacco abuse (40 pack years, quit 1982)presented with dysphagiato solids and liquids andprojectile vomiting.   Hospital Course:  Barium esophagram showedbarium to be grossly retained at the midesophagus with tight, strictured appearing caliber of the esophagus at the level of the left mainstem bronchus.Subsequently taken for EGD by gastroenterology. Upper 1/3 of esophagus with retained food. Dilation was attempted unsuccessfully. Narrowing of esophagus is extrinsic Chemo port placed by IR on 1/29. IR was unable to place PEG tube due to retained esophageal contents and stenosis, unable to pass catheter through esophagus. Surgery 1/30 for gastric tube placement.  Overnight 1/31-2/1 patient acutely decompensated with increasing shortness of breath, restlessness and distress.  Family was contacted and decided on full comfort measures for the patient, given his stage IV cancer and poor overall prognosis.  Patient passed away this afternoon, son at bedside.   Time: 1245  Signed:  Ezekiel Slocumb, DO  [facility] 06-15-19, 1:10 PM

## 2019-06-19 NOTE — Progress Notes (Signed)
[place] paged to 1A @ 12:47pm after pt. died.  Family was not aware at this time; just as [place] arrived, RN called pt.'s son Laverna Peace') to come to 1A; RN shared news of pt.'s death w/son.  Son spent some time @ bedside; [place] helped facilitate additional family members' visitation.  Jimmy shared on the way to the lobby the pt. had advanced lung cancer but had not revealed the extent of illness to family. Granddaughter Roselyn Reef) visited next (escorted by Laverna Peace); gdtr. shaky and tearful; had attempted to visit earlier this AM but was unable due to [place]; drove here earlier today from [employer].  Pt.'s stepson and stepson's wife visited next; stepson visibly tense; shared w/CH: 'I know this is not your fault, but I am livid with this hospital'; shared he drove '400 miles' to be here this AM but was not allowed to visit; intensely frustrated/angry that he could not see his fr. before he died.  [place] shared frustrations w/RN, expressing similar sentiments; wept openly @ bedside w/wife.    Pt.'s friend/pastor/POA communicating w/family via phone during CH's visit, helping make arrangements for pt.'s funeral, etc.  Family appear to be in general agreement re: pt.'s readiness to die; significantly frustrated w/denial of visitation until after pt.'s death.  CH remains available as needed.       June 01, 2019 1300  Clinical Encounter Type  Visited With Family;Patient and family together;Health care provider  Visit Type Initial;Spiritual support;Psychological support;Social support;Death  Referral From Nurse  Stress Factors  Family Stress Factors Family relationships;Health changes;Loss;Other (Comment) (Anger re: visitation policy)

## 2019-06-19 NOTE — Progress Notes (Signed)
PT Cancellation Note  Patient Details Name: Carlos Barrera MRN: [id number] DOB: 09-06-1940   Cancelled Treatment:    Reason Eval/Treat Not Completed: Other (comment); Consult received, chart reviewed. Pt noted to now be comfort care measures only. RN confirmed by phone. Will sign off for PT.   D. Scott Novella Abraha PT, DPT 06-03-19, 9:14 AM

## 2019-06-19 NOTE — Progress Notes (Signed)
Nutrition Brief Follow-Up Note  Chart reviewed. Patient has now transitioned to comfort care.   No further nutrition interventions warranted at this time. Please re-consult RD as needed.   Jacklynn Barnacle, MS, RD, LDN Office: [phone] Pager: [phone] After Hours/Weekend Pager: [phone]

## 2019-06-19 NOTE — Consult Note (Signed)
Consultation Note Date: 2019-06-18   Patient Name: Carlos Barrera  DOB: 30-Jun-1940  MRN: [id number]  Age / Sex: 79 y.o., male   PCP: [facility], [facility] Referring Physician: Ezekiel Slocumb, DO   REASON FOR CONSULTATION:Non pain symptom management, Pain control and Terminal Care  Palliative Care consult requested for goals of care discussion in this 79 y.o. male with multiple medical problems including atrial fibrillation, COPD, hypertension, hyperlipidemia, metastatic lung cancer, agent orange exposure, prostate cancer, hypothyroidism, and tobacco abuse (quit in 1982 40pk/yr). Carlos Barrera has encountered multiple admissions over the past 2-3 months. He was admitted 04/27/19 for pericardial effusion. He underwent an unsuccessful cardiocentesis. He presented to ED with complaints of dysphagia, decreased  Appetite, and vomiting. It was noted patient reported this has been ongoing for 3 weeks or more. Since admission patient evaluated by GI. EGD showed 1/3 of esophagus with retained food, unsuccessful attempt for dilation due to narrowing. IR was unable to place feeding tube due to stenosis. G-tube placed on 1/30. Patient was originally planned to start palliative chemotherapy on 2/4 however further decline in health on 05/20/18.  As noted patient's son (POA) decided to transition to comfort care.   Clinical Assessment and Goals of Care: I have reviewed medical records including lab results, imaging, Epic notes, and MAR, received report from the bedside RN, and assessed the patient.  Patient currently on full comfort care measures. On assessment audible gurgling and congestion. Patient unresponsive. Tachycardic. Tachypneic.  No family at the bedside. Patient actively dying in some respiratory distress.   RN on the phone with son discussion visitation. Family deciding who will visit at the bedside and is on the way.   As noted patient transitioned to full comfort at request of POA's patient  son and Carlos Barrera. DNR/DNI orders are in place.   Repositioned patient and assisted with symptom management. Son hs confirmed comfort care measures and main focus is getting to the bedside and trying to compromise with family on who will be allowed to visit in the setting of strict visitation guidelines due to [place].    SOCIAL HISTORY:     reports that he quit smoking about 39 years ago. His smoking use included cigarettes. He has a 40.00 pack-year smoking history. He has never used smokeless tobacco. He reports that he does not drink alcohol or use drugs.  CODE STATUS: DNR  ADVANCE DIRECTIVES: Carlos Barrera    SYMPTOM MANAGEMENT: see below   Palliative Prophylaxis:   Aspiration, Eye Care, Frequent Pain Assessment and Oral Care  PSYCHO-SOCIAL/SPIRITUAL:  Support System: Family  Desire for further Chaplaincy support:NO   Additional Recommendations (Limitations, Scope, Preferences):  Full Comfort Care as initiated by attending team.    PAST MEDICAL HISTORY: Past Medical History:  Diagnosis Date  . Agent orange exposure   . Asthma    exercise induced asthma   . COPD (chronic obstructive pulmonary disease) ([place])   . DJD (degenerative joint disease)    a. 02/2019 s/p L TKA.  Marland Kitchen GERD (gastroesophageal reflux disease)   . Hearing loss    wears hearing aids  . History of cardiac cath    a. History of 2 cardiac catheterizations, last ~ 10 yrs ago @ [facility]-->reportedly nl.  . History of stress test    a. 2020 - pharmacologic stress testing @ [facility]-->reportedly nl.  . Hyperlipidemia   . Hypertension   . Hypothyroidism   . Insomnia   . Lung cancer ([place])  a. Dx 03/2019.  Marland Kitchen Pericardial effusion    a. 03/2019 Echo: EF 60-65%, no rwma, nl RV fxn. Mildly dil LA. Mod pericardia eff w/o tamponade. Mild Ao sclerosis.  . Prostate cancer ([place])   . PTSD (post-traumatic stress disorder)   . Seasonal allergies   . Thyroid disease     PAST SURGICAL HISTORY:  Past Surgical History:    Procedure Laterality Date  . BACK SURGERY     x 2  . COLONOSCOPY     hx polyps  . ESOPHAGOGASTRODUODENOSCOPY (EGD) WITH PROPOFOL N/A 05/10/2019   Procedure: ESOPHAGOGASTRODUODENOSCOPY (EGD) WITH PROPOFOL;  Surgeon: [name], MD;  Location: [facility] ENDOSCOPY;  Service: Endoscopy;  Laterality: N/A;  . GASTROSTOMY N/A 05/11/2019   Procedure: INSERTION OF GASTROSTOMY TUBE;  Surgeon: Benjamine Sprague, DO;  Location: [facility] ORS;  Service: General;  Laterality: N/A;  . HERNIA REPAIR    . INCISE AND DRAIN ABCESS     patient denies this procedure 03/03/19  . IR IMAGING GUIDED PORT INSERTION  05/19/2019  . NECK SURGERY     x 1  . PERICARDIOCENTESIS N/A 04/28/2019   Procedure: PERICARDIOCENTESIS;  Surgeon: Wellington Hampshire, MD;  Location: [facility] CV LAB;  Service: Cardiovascular;  Laterality: N/A;  . TONSILLECTOMY    . 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 TOTAL KNEE ARTHROPLASTY Left 03/09/2019   Procedure: LEFT TOTAL KNEE ARTHROPLASTY;  Surgeon: Meredith Pel, MD;  Location: [place];  Service: Orthopedics;  Laterality: Left;  . WISDOM TOOTH EXTRACTION      ALLERGIES:  is allergic to bee venom and feldene [piroxicam].   MEDICATIONS:  Current Facility-Administered Medications  Medication Dose Route Frequency Provider Last Rate Last Admin  . 0.9 %  sodium chloride infusion   Intravenous Continuous Pickenpack-Cousar, Griffey Nicasio N, NP 20 mL/hr at 05/21/19 1535 Rate Verify at 05/21/19 1535  . glycopyrrolate (ROBINUL) injection 0.4 mg  0.4 mg Intravenous Q4H Pickenpack-Cousar, Deisha Stull N, NP      . ipratropium-albuterol (DUONEB) 0.5-2.5 (3) MG/3ML nebulizer solution 3 mL  3 mL Nebulization Q6H PRN Lang Snow, NP   3 mL at 05/21/19 2312  . LORazepam (ATIVAN) injection 1-2 mg  1-2 mg Intravenous Q2H PRN Lang Snow, NP   2 mg at Jun 10, 2019 0759  . morphine 100mg  in NS 152mL (1mg /mL) infusion - premix  2 mg/hr Intravenous Continuous Nicole Kindred A, DO 2 mL/hr at June 10, 2019  1106 2 mg/hr at 10-Jun-2019 1106  . morphine bolus via infusion 2-4 mg  2-4 mg Intravenous PRN Pickenpack-Cousar, Jillyn Stacey N, NP      . ondansetron (ZOFRAN) injection 4 mg  4 mg Intravenous Q6H PRN Sakai, Isami, DO      . scopolamine (TRANSDERM-SCOP) 1 MG/3DAYS 1.5 mg  1 patch Transdermal Q72H Pickenpack-Cousar, Zay Yeargan N, NP        VITAL SIGNS: BP (!) 155/105 (BP Location: Right Arm)   Pulse 93   Temp (!) 97.2 F (36.2 C) (Axillary)   Resp (!) 24   Ht 5\' 8"  (1.727 m)   Wt 60.8 kg   SpO2 93%   BMI 20.38 kg/m  Filed Weights   05/21/2019 1006 04/28/2019 0859  Weight: 67 kg 60.8 kg    Estimated body mass index is 20.38 kg/m as calculated from the following:   Height as of this encounter: 5\' 8"  (1.727 m).   Weight as of this encounter: 60.8 kg.  LABS: CBC:    Component Value Date/Time   WBC 8.9 05/21/2019 0506  HGB 12.0 (L) 05/21/2019 0506   HCT 36.9 (L) 05/21/2019 0506   PLT 225 05/21/2019 0506   Comprehensive Metabolic Panel:    Component Value Date/Time   NA 132 (L) 05/21/2019 0506   K 4.3 05/21/2019 0506   CO2 22 05/21/2019 0506   BUN 8 05/21/2019 0506   CREATININE 0.99 05/21/2019 0506   ALBUMIN 3.6 04/21/2019 1120     Review of Systems  Unable to perform ROS: Patient unresponsive   Physical Exam General: moderate respiratory distress, actively dying, frail chronically-ill appearing, cachectic Cardiovascular: Irregular/tachycardic Pulmonary: bilateral rhonchi, audible gurgling  Abdomen: PEG tube in place, hypoactive bowel sounds Neurological: unresponsive   Prognosis: minutes to hours. Patient in moderate to severe respiratory distress. Audible gurgling, tachycardic, tachypneic. He is actively dying. I anticipate patient will pass away within minutes to hours. Unresponsive.  Discharge Planning:  Anticipated Hospital Death  Recommendations:  DNR/DNI  Continue with current comfort care measures as per attending  Continue morphine drip for pain/air  hunger-orders given to titrate for comfort, morphine boluses PRN from infusion  Robinul Scheduled for excessive secretions  Scopolamine patch for secretions  Continue ativan prn for agitation/anxiety   PMT will continue to support and follow.    Palliative Performance Scale: Actively Dying                 Thank you for allowing the Palliative Medicine Team to assist in the care of this patient.  Time In: 1100 Time Out: 1145 Time Total: 45 min.   Visit consisted of counseling and education dealing with the complex and emotionally intense issues of symptom management and palliative care in the setting of serious and potentially life-threatening illness.Greater than 50%  of this time was spent counseling and coordinating care related to the above assessment and plan.  Signed by:  Alda Lea, AGPCNP-BC Palliative Medicine Team  Phone: [phone] Fax: [phone] Pager: [phone] Amion: Bjorn Pippin

## 2019-06-19 DEATH — deceased

## 2019-06-22 ENCOUNTER — Other Ambulatory Visit: Payer: Medicare Other

## 2019-07-11 ENCOUNTER — Ambulatory Visit: Payer: Medicare Other | Admitting: Cardiovascular Disease

## 2020-01-10 ENCOUNTER — Other Ambulatory Visit: Payer: No Typology Code available for payment source

## 2020-01-17 ENCOUNTER — Ambulatory Visit: Payer: No Typology Code available for payment source | Admitting: Radiation Oncology

## 2021-03-17 IMAGING — DX DG CHEST 2V
2 series · 2 of 2 positions shown · non-contrast
Comparison: May 07, 2014

CLINICAL DATA: Chest pain

EXAM:
CHEST - 2 VIEW

[chest pa]
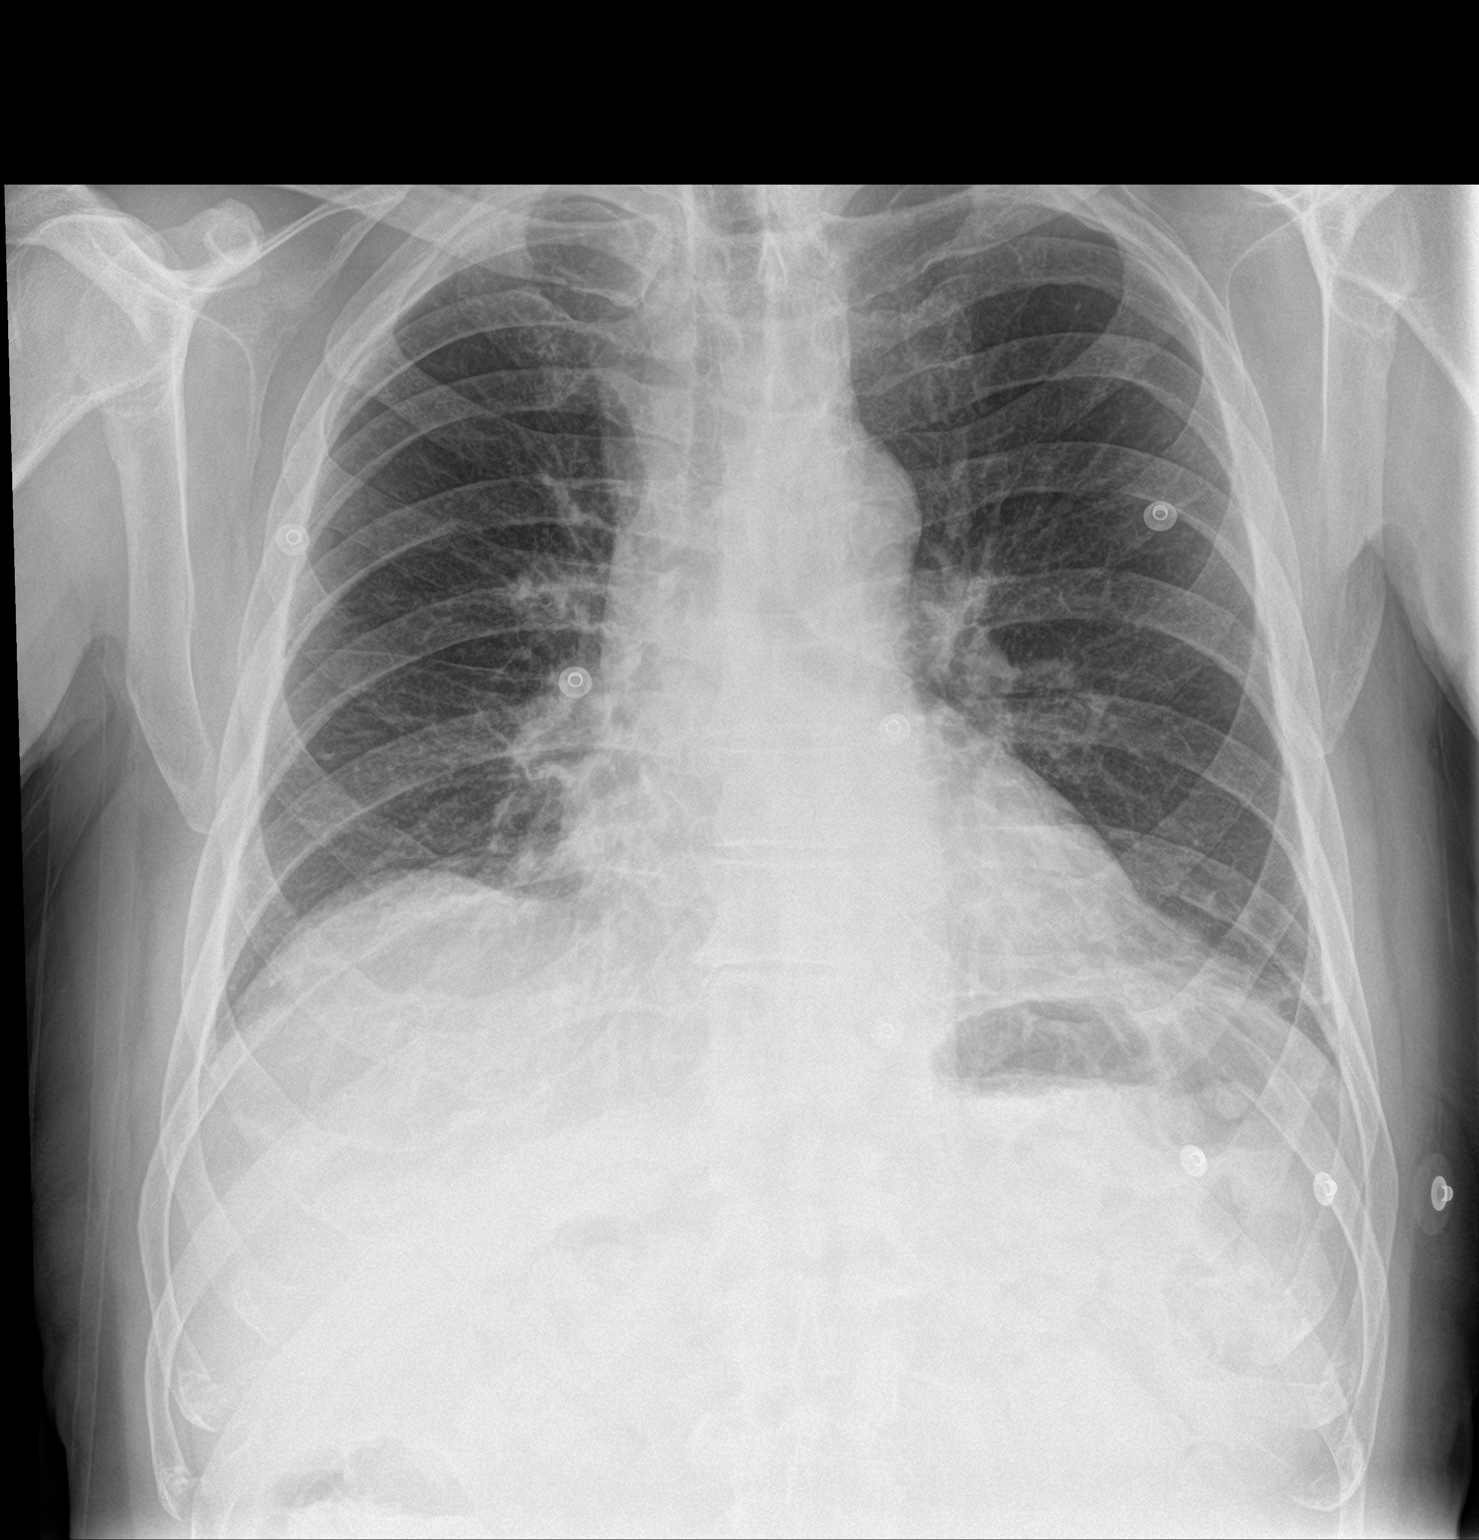

[chest lat]
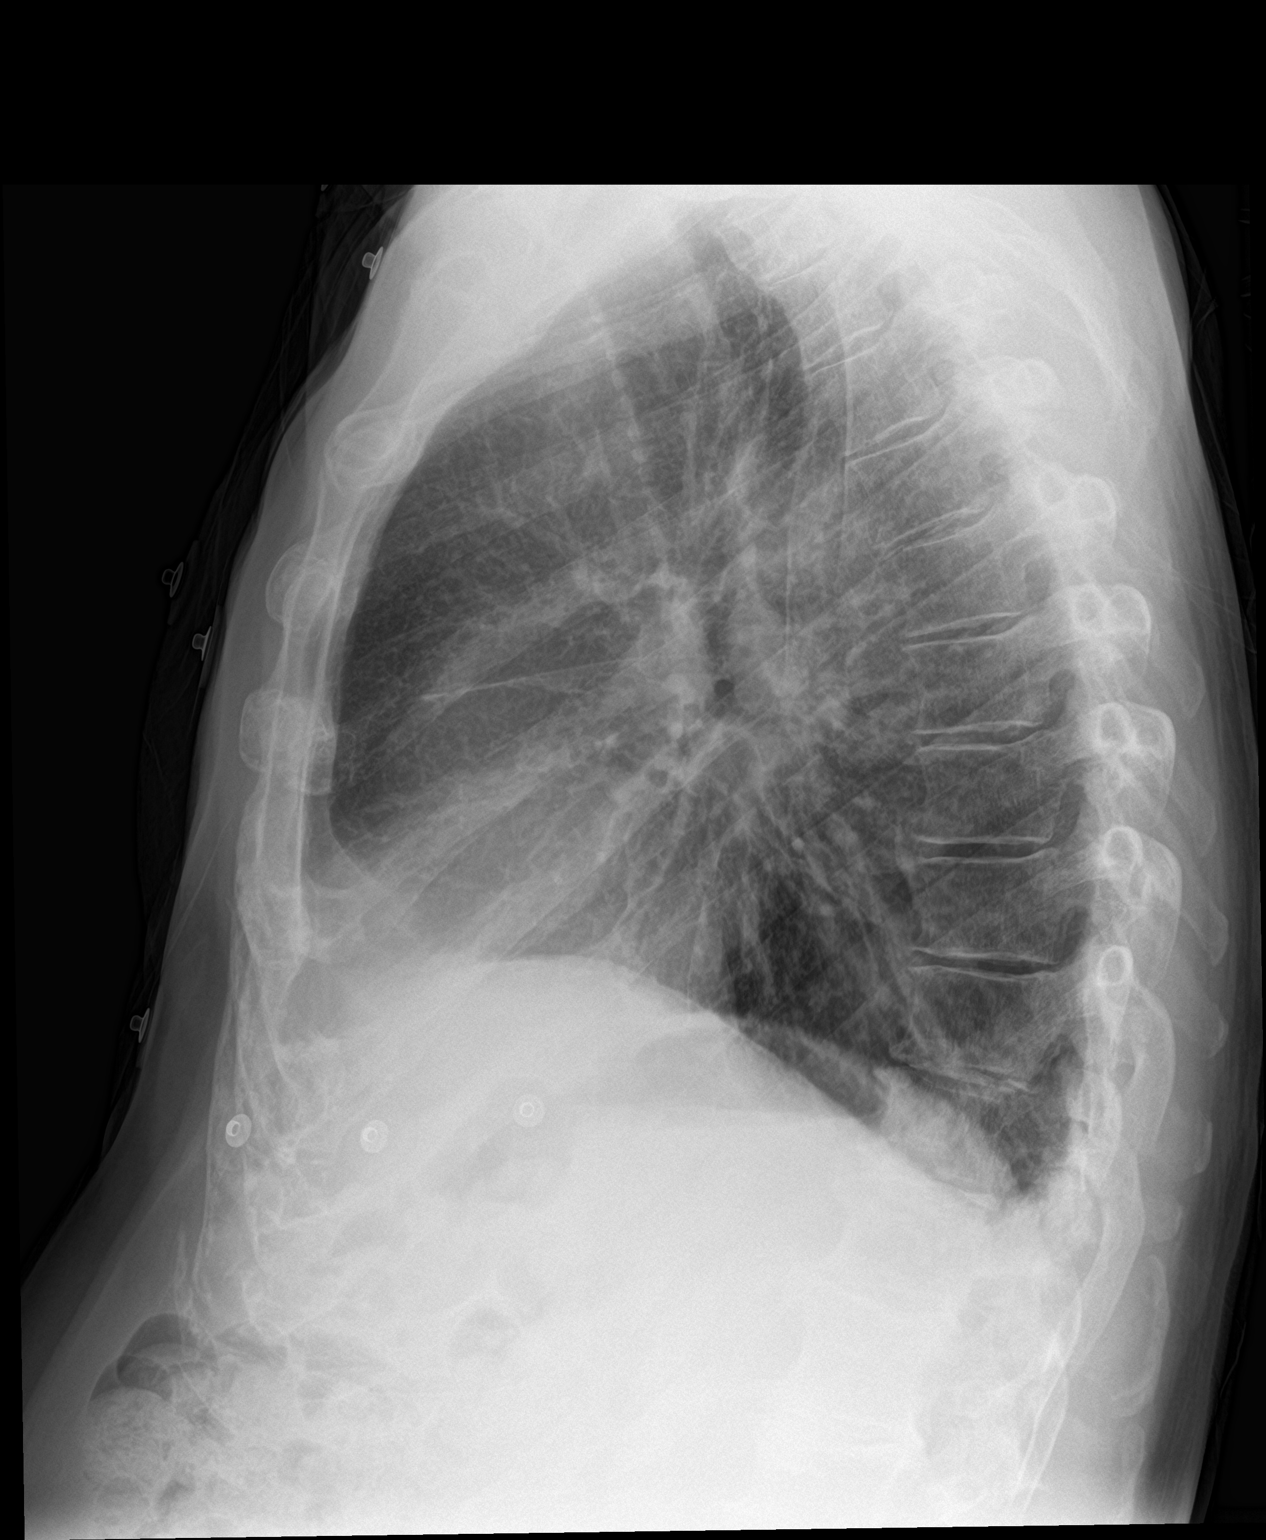

[2 of 2 positions shown; findings below may reference images not displayed]

FINDINGS: There is mild atelectasis in the left base. The lungs elsewhere are
clear. Heart size and pulmonary vascularity are normal. No
adenopathy. No pneumothorax. There is postoperative change in the
lower cervical region.
IMPRESSION: Mild left base atelectasis. Lungs elsewhere clear. Cardiac
silhouette within normal limits.
